# Patient Record
Sex: Female | Born: 1940
Health system: Southern US, Community
[De-identification: ages and names within clinical notes are randomized; demographics above are authoritative.]

## PROBLEM LIST (undated history)

## (undated) DIAGNOSIS — Z8639 Personal history of other endocrine, nutritional and metabolic disease: Secondary | ICD-10-CM

## (undated) DIAGNOSIS — H8102 Meniere's disease, left ear: Secondary | ICD-10-CM

## (undated) DIAGNOSIS — M797 Fibromyalgia: Secondary | ICD-10-CM

## (undated) DIAGNOSIS — R000 Tachycardia, unspecified: Secondary | ICD-10-CM

## (undated) DIAGNOSIS — G250 Essential tremor: Secondary | ICD-10-CM

## (undated) DIAGNOSIS — I7300 Raynaud's syndrome without gangrene: Secondary | ICD-10-CM

## (undated) DIAGNOSIS — K219 Gastro-esophageal reflux disease without esophagitis: Secondary | ICD-10-CM

## (undated) DIAGNOSIS — E118 Type 2 diabetes mellitus with unspecified complications: Secondary | ICD-10-CM

## (undated) DIAGNOSIS — I201 Angina pectoris with documented spasm: Secondary | ICD-10-CM

## (undated) DIAGNOSIS — R5383 Other fatigue: Secondary | ICD-10-CM

## (undated) DIAGNOSIS — E042 Nontoxic multinodular goiter: Secondary | ICD-10-CM

## (undated) DIAGNOSIS — K449 Diaphragmatic hernia without obstruction or gangrene: Secondary | ICD-10-CM

## (undated) DIAGNOSIS — E782 Mixed hyperlipidemia: Secondary | ICD-10-CM

## (undated) DIAGNOSIS — I10 Essential (primary) hypertension: Secondary | ICD-10-CM

## (undated) DIAGNOSIS — H353190 Nonexudative age-related macular degeneration, unspecified eye, stage unspecified: Secondary | ICD-10-CM

## (undated) DIAGNOSIS — E538 Deficiency of other specified B group vitamins: Secondary | ICD-10-CM

## (undated) DIAGNOSIS — J449 Chronic obstructive pulmonary disease, unspecified: Secondary | ICD-10-CM

## (undated) DIAGNOSIS — E1140 Type 2 diabetes mellitus with diabetic neuropathy, unspecified: Secondary | ICD-10-CM

## (undated) DIAGNOSIS — Z860100 Personal history of colon polyps, unspecified: Secondary | ICD-10-CM

## (undated) DIAGNOSIS — F419 Anxiety disorder, unspecified: Secondary | ICD-10-CM

## (undated) DIAGNOSIS — K589 Irritable bowel syndrome without diarrhea: Secondary | ICD-10-CM

## (undated) DIAGNOSIS — K5900 Constipation, unspecified: Secondary | ICD-10-CM

## (undated) DIAGNOSIS — R1013 Epigastric pain: Secondary | ICD-10-CM

## (undated) DIAGNOSIS — K5190 Ulcerative colitis, unspecified, without complications: Secondary | ICD-10-CM

## (undated) DIAGNOSIS — E063 Autoimmune thyroiditis: Secondary | ICD-10-CM

## (undated) DIAGNOSIS — E559 Vitamin D deficiency, unspecified: Secondary | ICD-10-CM

## (undated) DIAGNOSIS — H269 Unspecified cataract: Secondary | ICD-10-CM

## (undated) DIAGNOSIS — M1990 Unspecified osteoarthritis, unspecified site: Secondary | ICD-10-CM

## (undated) DIAGNOSIS — Z973 Presence of spectacles and contact lenses: Secondary | ICD-10-CM

## (undated) DIAGNOSIS — I2510 Atherosclerotic heart disease of native coronary artery without angina pectoris: Secondary | ICD-10-CM

## (undated) DIAGNOSIS — J45909 Unspecified asthma, uncomplicated: Secondary | ICD-10-CM

## (undated) DIAGNOSIS — E7800 Pure hypercholesterolemia, unspecified: Secondary | ICD-10-CM

## (undated) DIAGNOSIS — F439 Reaction to severe stress, unspecified: Secondary | ICD-10-CM

## (undated) DIAGNOSIS — M720 Palmar fascial fibromatosis [Dupuytren]: Secondary | ICD-10-CM

## (undated) DIAGNOSIS — G43009 Migraine without aura, not intractable, without status migrainosus: Secondary | ICD-10-CM

## (undated) HISTORY — DX: Raynaud's syndrome without gangrene: I73.00

## (undated) HISTORY — DX: Gastro-esophageal reflux disease without esophagitis: K21.9

## (undated) HISTORY — DX: Atherosclerotic heart disease of native coronary artery without angina pectoris: I25.10

## (undated) HISTORY — DX: Palmar fascial fibromatosis (dupuytren): M72.0

## (undated) HISTORY — DX: Essential tremor: G25.0

## (undated) HISTORY — DX: Nontoxic multinodular goiter: E04.2

## (undated) HISTORY — DX: Ulcerative colitis, unspecified, without complications: K51.90

## (undated) HISTORY — DX: Meniere's disease, left ear: H81.02

## (undated) HISTORY — DX: Unspecified cataract: H26.9

## (undated) HISTORY — DX: Mixed hyperlipidemia: E78.2

## (undated) HISTORY — DX: Deficiency of other specified B group vitamins: E53.8

## (undated) HISTORY — DX: Irritable bowel syndrome, unspecified: K58.9

## (undated) HISTORY — DX: Fibromyalgia: M79.7

## (undated) HISTORY — DX: Personal history of colon polyps, unspecified: Z86.0100

## (undated) HISTORY — DX: Autoimmune thyroiditis: E06.3

## (undated) HISTORY — DX: Personal history of colonic polyps: Z86.010

## (undated) HISTORY — DX: Chronic obstructive pulmonary disease, unspecified: J44.9

## (undated) HISTORY — DX: Type 2 diabetes mellitus with unspecified complications: E11.8

## (undated) HISTORY — DX: Constipation, unspecified: K59.00

## (undated) HISTORY — DX: Migraine without aura, not intractable, without status migrainosus: G43.009

## (undated) HISTORY — PX: TONSILLECTOMY: SUR1361

## (undated) HISTORY — DX: Epigastric pain: R10.13

## (undated) HISTORY — DX: Angina pectoris with documented spasm: I20.1

## (undated) HISTORY — DX: Personal history of other endocrine, nutritional and metabolic disease: Z86.39

## (undated) HISTORY — DX: Reaction to severe stress, unspecified: F43.9

## (undated) HISTORY — DX: Tachycardia, unspecified: R00.0

## (undated) HISTORY — DX: Vitamin D deficiency, unspecified: E55.9

## (undated) HISTORY — DX: Type 2 diabetes mellitus with diabetic neuropathy, unspecified: E11.40

## (undated) HISTORY — DX: Anxiety disorder, unspecified: F41.9

## (undated) HISTORY — DX: Nonexudative age-related macular degeneration, unspecified eye, stage unspecified: H35.3190

## (undated) HISTORY — DX: Pure hypercholesterolemia, unspecified: E78.00

## (undated) HISTORY — DX: Essential (primary) hypertension: I10

## (undated) HISTORY — DX: Unspecified asthma, uncomplicated: J45.909

## (undated) HISTORY — DX: Diaphragmatic hernia without obstruction or gangrene: K44.9

## (undated) HISTORY — PX: BREAST EXCISIONAL BIOPSY: SUR124

## (undated) HISTORY — PX: DILATION AND CURETTAGE OF UTERUS: SHX78

## (undated) HISTORY — DX: Other fatigue: R53.83

---

## 1969-02-24 HISTORY — PX: PARTIAL HYSTERECTOMY: SHX80

## 1988-02-25 HISTORY — PX: BREAST LUMPECTOMY: SHX2

## 1997-06-05 ENCOUNTER — Other Ambulatory Visit: Admission: RE | Admit: 1997-06-05 | Discharge: 1997-06-05 | Payer: Self-pay | Admitting: Obstetrics and Gynecology

## 1998-06-05 ENCOUNTER — Other Ambulatory Visit: Admission: RE | Admit: 1998-06-05 | Discharge: 1998-06-05 | Payer: Self-pay | Admitting: Obstetrics and Gynecology

## 1999-03-08 ENCOUNTER — Encounter (INDEPENDENT_AMBULATORY_CARE_PROVIDER_SITE_OTHER): Payer: Self-pay | Admitting: Specialist

## 1999-03-08 ENCOUNTER — Other Ambulatory Visit: Admission: RE | Admit: 1999-03-08 | Discharge: 1999-03-08 | Payer: Self-pay | Admitting: Gastroenterology

## 1999-04-19 ENCOUNTER — Encounter: Admission: RE | Admit: 1999-04-19 | Discharge: 1999-04-19 | Payer: Self-pay | Admitting: Obstetrics and Gynecology

## 1999-04-19 ENCOUNTER — Encounter: Payer: Self-pay | Admitting: Obstetrics and Gynecology

## 1999-04-29 ENCOUNTER — Ambulatory Visit (HOSPITAL_COMMUNITY): Admission: RE | Admit: 1999-04-29 | Discharge: 1999-04-29 | Payer: Self-pay | Admitting: Family Medicine

## 1999-04-29 ENCOUNTER — Encounter: Payer: Self-pay | Admitting: Family Medicine

## 1999-05-17 ENCOUNTER — Encounter: Payer: Self-pay | Admitting: *Deleted

## 1999-05-17 ENCOUNTER — Ambulatory Visit (HOSPITAL_COMMUNITY): Admission: RE | Admit: 1999-05-17 | Discharge: 1999-05-17 | Payer: Self-pay | Admitting: *Deleted

## 1999-05-24 ENCOUNTER — Other Ambulatory Visit: Admission: RE | Admit: 1999-05-24 | Discharge: 1999-05-24 | Payer: Self-pay | Admitting: Obstetrics and Gynecology

## 1999-08-14 ENCOUNTER — Ambulatory Visit (HOSPITAL_COMMUNITY): Admission: RE | Admit: 1999-08-14 | Discharge: 1999-08-14 | Payer: Self-pay | Admitting: Family Medicine

## 1999-08-14 ENCOUNTER — Encounter: Payer: Self-pay | Admitting: Family Medicine

## 1999-12-16 ENCOUNTER — Encounter: Payer: Self-pay | Admitting: Family Medicine

## 1999-12-16 ENCOUNTER — Ambulatory Visit (HOSPITAL_COMMUNITY): Admission: RE | Admit: 1999-12-16 | Discharge: 1999-12-16 | Payer: Self-pay | Admitting: Family Medicine

## 2000-01-02 ENCOUNTER — Encounter (INDEPENDENT_AMBULATORY_CARE_PROVIDER_SITE_OTHER): Payer: Self-pay | Admitting: Specialist

## 2000-01-02 ENCOUNTER — Other Ambulatory Visit: Admission: RE | Admit: 2000-01-02 | Discharge: 2000-01-02 | Payer: Self-pay | Admitting: Gastroenterology

## 2000-04-20 ENCOUNTER — Encounter: Payer: Self-pay | Admitting: Obstetrics and Gynecology

## 2000-04-20 ENCOUNTER — Encounter: Admission: RE | Admit: 2000-04-20 | Discharge: 2000-04-20 | Payer: Self-pay | Admitting: Obstetrics and Gynecology

## 2000-05-29 ENCOUNTER — Other Ambulatory Visit: Admission: RE | Admit: 2000-05-29 | Discharge: 2000-05-29 | Payer: Self-pay | Admitting: Obstetrics and Gynecology

## 2000-07-18 ENCOUNTER — Encounter: Payer: Self-pay | Admitting: Emergency Medicine

## 2000-07-18 ENCOUNTER — Emergency Department (HOSPITAL_COMMUNITY): Admission: EM | Admit: 2000-07-18 | Discharge: 2000-07-18 | Payer: Self-pay | Admitting: Emergency Medicine

## 2001-01-26 ENCOUNTER — Emergency Department (HOSPITAL_COMMUNITY): Admission: EM | Admit: 2001-01-26 | Discharge: 2001-01-27 | Payer: Self-pay | Admitting: Emergency Medicine

## 2001-01-26 ENCOUNTER — Encounter: Payer: Self-pay | Admitting: Emergency Medicine

## 2001-01-27 ENCOUNTER — Encounter: Payer: Self-pay | Admitting: Family Medicine

## 2001-01-27 ENCOUNTER — Ambulatory Visit (HOSPITAL_COMMUNITY): Admission: RE | Admit: 2001-01-27 | Discharge: 2001-01-27 | Payer: Self-pay | Admitting: Family Medicine

## 2001-01-28 ENCOUNTER — Encounter (INDEPENDENT_AMBULATORY_CARE_PROVIDER_SITE_OTHER): Payer: Self-pay | Admitting: Specialist

## 2001-01-28 ENCOUNTER — Encounter: Payer: Self-pay | Admitting: General Surgery

## 2001-01-29 ENCOUNTER — Inpatient Hospital Stay (HOSPITAL_COMMUNITY): Admission: EM | Admit: 2001-01-29 | Discharge: 2001-01-30 | Payer: Self-pay | Admitting: General Surgery

## 2001-04-22 ENCOUNTER — Encounter: Admission: RE | Admit: 2001-04-22 | Discharge: 2001-04-22 | Payer: Self-pay | Admitting: Obstetrics and Gynecology

## 2001-04-22 ENCOUNTER — Encounter: Payer: Self-pay | Admitting: Obstetrics and Gynecology

## 2001-05-06 ENCOUNTER — Ambulatory Visit (HOSPITAL_COMMUNITY): Admission: RE | Admit: 2001-05-06 | Discharge: 2001-05-06 | Payer: Self-pay | Admitting: General Surgery

## 2001-05-06 ENCOUNTER — Encounter: Payer: Self-pay | Admitting: General Surgery

## 2001-05-31 ENCOUNTER — Other Ambulatory Visit: Admission: RE | Admit: 2001-05-31 | Discharge: 2001-05-31 | Payer: Self-pay | Admitting: Obstetrics and Gynecology

## 2001-08-03 ENCOUNTER — Emergency Department (HOSPITAL_COMMUNITY): Admission: EM | Admit: 2001-08-03 | Discharge: 2001-08-03 | Payer: Self-pay | Admitting: Emergency Medicine

## 2001-08-03 ENCOUNTER — Encounter: Payer: Self-pay | Admitting: Emergency Medicine

## 2001-08-09 ENCOUNTER — Emergency Department (HOSPITAL_COMMUNITY): Admission: EM | Admit: 2001-08-09 | Discharge: 2001-08-09 | Payer: Self-pay | Admitting: Emergency Medicine

## 2002-02-22 ENCOUNTER — Encounter: Payer: Self-pay | Admitting: Family Medicine

## 2002-02-22 ENCOUNTER — Encounter: Admission: RE | Admit: 2002-02-22 | Discharge: 2002-02-22 | Payer: Self-pay | Admitting: Family Medicine

## 2002-06-02 ENCOUNTER — Encounter: Admission: RE | Admit: 2002-06-02 | Discharge: 2002-06-02 | Payer: Self-pay | Admitting: Obstetrics and Gynecology

## 2002-06-02 ENCOUNTER — Encounter: Payer: Self-pay | Admitting: Obstetrics and Gynecology

## 2002-06-06 ENCOUNTER — Other Ambulatory Visit: Admission: RE | Admit: 2002-06-06 | Discharge: 2002-06-06 | Payer: Self-pay | Admitting: Obstetrics and Gynecology

## 2002-07-11 ENCOUNTER — Encounter: Admission: RE | Admit: 2002-07-11 | Discharge: 2002-07-11 | Payer: Self-pay | Admitting: Rheumatology

## 2002-07-11 ENCOUNTER — Encounter: Payer: Self-pay | Admitting: Rheumatology

## 2003-03-28 ENCOUNTER — Ambulatory Visit (HOSPITAL_COMMUNITY): Admission: RE | Admit: 2003-03-28 | Discharge: 2003-03-28 | Payer: Self-pay | Admitting: Gastroenterology

## 2003-06-13 ENCOUNTER — Encounter: Admission: RE | Admit: 2003-06-13 | Discharge: 2003-06-13 | Payer: Self-pay | Admitting: Obstetrics and Gynecology

## 2003-06-19 ENCOUNTER — Other Ambulatory Visit: Admission: RE | Admit: 2003-06-19 | Discharge: 2003-06-19 | Payer: Self-pay | Admitting: Obstetrics and Gynecology

## 2003-12-26 ENCOUNTER — Ambulatory Visit (HOSPITAL_COMMUNITY): Admission: RE | Admit: 2003-12-26 | Discharge: 2003-12-26 | Payer: Self-pay | Admitting: Radiation Oncology

## 2004-03-04 ENCOUNTER — Ambulatory Visit (HOSPITAL_COMMUNITY): Admission: RE | Admit: 2004-03-04 | Discharge: 2004-03-04 | Payer: Self-pay | Admitting: Family Medicine

## 2004-05-28 ENCOUNTER — Encounter: Admission: RE | Admit: 2004-05-28 | Discharge: 2004-05-28 | Payer: Self-pay | Admitting: Gastroenterology

## 2004-06-13 ENCOUNTER — Encounter: Admission: RE | Admit: 2004-06-13 | Discharge: 2004-06-13 | Payer: Self-pay | Admitting: Obstetrics and Gynecology

## 2004-06-21 ENCOUNTER — Other Ambulatory Visit: Admission: RE | Admit: 2004-06-21 | Discharge: 2004-06-21 | Payer: Self-pay | Admitting: Obstetrics and Gynecology

## 2004-11-26 ENCOUNTER — Ambulatory Visit (HOSPITAL_COMMUNITY): Admission: RE | Admit: 2004-11-26 | Discharge: 2004-11-26 | Payer: Self-pay | Admitting: Gastroenterology

## 2004-11-26 ENCOUNTER — Encounter (INDEPENDENT_AMBULATORY_CARE_PROVIDER_SITE_OTHER): Payer: Self-pay | Admitting: *Deleted

## 2005-03-03 ENCOUNTER — Ambulatory Visit (HOSPITAL_COMMUNITY): Admission: RE | Admit: 2005-03-03 | Discharge: 2005-03-03 | Payer: Self-pay | Admitting: Oncology

## 2005-03-25 ENCOUNTER — Ambulatory Visit: Payer: Self-pay | Admitting: "Endocrinology

## 2005-03-31 ENCOUNTER — Encounter (INDEPENDENT_AMBULATORY_CARE_PROVIDER_SITE_OTHER): Payer: Self-pay | Admitting: *Deleted

## 2005-03-31 ENCOUNTER — Encounter: Admission: RE | Admit: 2005-03-31 | Discharge: 2005-03-31 | Payer: Self-pay | Admitting: "Endocrinology

## 2005-03-31 ENCOUNTER — Other Ambulatory Visit: Admission: RE | Admit: 2005-03-31 | Discharge: 2005-03-31 | Payer: Self-pay | Admitting: Interventional Radiology

## 2005-04-14 ENCOUNTER — Ambulatory Visit: Payer: Self-pay | Admitting: "Endocrinology

## 2005-06-17 ENCOUNTER — Encounter: Admission: RE | Admit: 2005-06-17 | Discharge: 2005-06-17 | Payer: Self-pay | Admitting: Obstetrics and Gynecology

## 2005-06-19 ENCOUNTER — Ambulatory Visit (HOSPITAL_COMMUNITY): Admission: RE | Admit: 2005-06-19 | Discharge: 2005-06-19 | Payer: Self-pay | Admitting: "Endocrinology

## 2005-06-23 ENCOUNTER — Other Ambulatory Visit: Admission: RE | Admit: 2005-06-23 | Discharge: 2005-06-23 | Payer: Self-pay | Admitting: Obstetrics and Gynecology

## 2005-06-24 ENCOUNTER — Ambulatory Visit: Payer: Self-pay | Admitting: "Endocrinology

## 2005-09-22 ENCOUNTER — Ambulatory Visit: Payer: Self-pay | Admitting: "Endocrinology

## 2005-12-19 ENCOUNTER — Ambulatory Visit (HOSPITAL_COMMUNITY): Admission: RE | Admit: 2005-12-19 | Discharge: 2005-12-19 | Payer: Self-pay | Admitting: "Endocrinology

## 2005-12-23 ENCOUNTER — Ambulatory Visit: Payer: Self-pay | Admitting: "Endocrinology

## 2005-12-30 ENCOUNTER — Encounter: Admission: RE | Admit: 2005-12-30 | Discharge: 2005-12-30 | Payer: Self-pay | Admitting: "Endocrinology

## 2005-12-30 ENCOUNTER — Encounter (INDEPENDENT_AMBULATORY_CARE_PROVIDER_SITE_OTHER): Payer: Self-pay | Admitting: Specialist

## 2005-12-30 ENCOUNTER — Other Ambulatory Visit: Admission: RE | Admit: 2005-12-30 | Discharge: 2005-12-30 | Payer: Self-pay | Admitting: Interventional Radiology

## 2006-01-07 ENCOUNTER — Observation Stay (HOSPITAL_COMMUNITY): Admission: EM | Admit: 2006-01-07 | Discharge: 2006-01-08 | Payer: Self-pay | Admitting: Emergency Medicine

## 2006-02-24 HISTORY — PX: CHOLECYSTECTOMY: SHX55

## 2006-04-13 ENCOUNTER — Ambulatory Visit: Payer: Self-pay | Admitting: "Endocrinology

## 2006-06-23 ENCOUNTER — Encounter: Admission: RE | Admit: 2006-06-23 | Discharge: 2006-06-23 | Payer: Self-pay | Admitting: Obstetrics and Gynecology

## 2006-06-29 ENCOUNTER — Encounter: Admission: RE | Admit: 2006-06-29 | Discharge: 2006-06-29 | Payer: Self-pay | Admitting: Obstetrics and Gynecology

## 2006-07-14 ENCOUNTER — Other Ambulatory Visit: Admission: RE | Admit: 2006-07-14 | Discharge: 2006-07-14 | Payer: Self-pay | Admitting: Obstetrics and Gynecology

## 2006-08-24 ENCOUNTER — Ambulatory Visit: Payer: Self-pay | Admitting: "Endocrinology

## 2006-11-25 ENCOUNTER — Ambulatory Visit (HOSPITAL_COMMUNITY): Admission: RE | Admit: 2006-11-25 | Discharge: 2006-11-25 | Payer: Self-pay | Admitting: "Endocrinology

## 2006-11-30 ENCOUNTER — Ambulatory Visit: Payer: Self-pay | Admitting: "Endocrinology

## 2006-12-30 ENCOUNTER — Encounter: Admission: RE | Admit: 2006-12-30 | Discharge: 2006-12-30 | Payer: Self-pay | Admitting: Obstetrics and Gynecology

## 2007-04-06 ENCOUNTER — Ambulatory Visit: Payer: Self-pay | Admitting: "Endocrinology

## 2007-06-24 ENCOUNTER — Encounter: Admission: RE | Admit: 2007-06-24 | Discharge: 2007-06-24 | Payer: Self-pay | Admitting: Family Medicine

## 2007-07-06 ENCOUNTER — Other Ambulatory Visit: Admission: RE | Admit: 2007-07-06 | Discharge: 2007-07-06 | Payer: Self-pay | Admitting: Family Medicine

## 2008-01-05 ENCOUNTER — Encounter: Admission: RE | Admit: 2008-01-05 | Discharge: 2008-01-05 | Payer: Self-pay | Admitting: Family Medicine

## 2008-01-21 ENCOUNTER — Encounter: Admission: RE | Admit: 2008-01-21 | Discharge: 2008-01-21 | Payer: Self-pay | Admitting: Family Medicine

## 2008-02-09 ENCOUNTER — Ambulatory Visit: Payer: Self-pay | Admitting: "Endocrinology

## 2008-03-23 ENCOUNTER — Encounter: Admission: RE | Admit: 2008-03-23 | Discharge: 2008-03-23 | Payer: Self-pay | Admitting: General Surgery

## 2008-08-22 ENCOUNTER — Encounter: Admission: RE | Admit: 2008-08-22 | Discharge: 2008-08-22 | Payer: Self-pay | Admitting: Family Medicine

## 2008-08-23 ENCOUNTER — Ambulatory Visit: Payer: Self-pay | Admitting: "Endocrinology

## 2009-02-19 ENCOUNTER — Encounter: Admission: RE | Admit: 2009-02-19 | Discharge: 2009-02-19 | Payer: Self-pay | Admitting: Family Medicine

## 2009-08-23 ENCOUNTER — Ambulatory Visit: Payer: Self-pay | Admitting: "Endocrinology

## 2009-08-23 ENCOUNTER — Encounter: Admission: RE | Admit: 2009-08-23 | Discharge: 2009-08-23 | Payer: Self-pay | Admitting: "Endocrinology

## 2009-09-17 ENCOUNTER — Encounter: Admission: RE | Admit: 2009-09-17 | Discharge: 2009-09-17 | Payer: Self-pay | Admitting: Family Medicine

## 2010-02-14 ENCOUNTER — Ambulatory Visit: Payer: Self-pay | Admitting: "Endocrinology

## 2010-03-17 ENCOUNTER — Encounter: Payer: Self-pay | Admitting: Family Medicine

## 2010-04-03 ENCOUNTER — Other Ambulatory Visit: Payer: Self-pay | Admitting: Gastroenterology

## 2010-06-18 ENCOUNTER — Encounter: Payer: Self-pay | Admitting: *Deleted

## 2010-06-18 ENCOUNTER — Other Ambulatory Visit: Payer: Self-pay | Admitting: *Deleted

## 2010-06-18 DIAGNOSIS — E7089 Other disorders of aromatic amino-acid metabolism: Secondary | ICD-10-CM

## 2010-06-18 DIAGNOSIS — E038 Other specified hypothyroidism: Secondary | ICD-10-CM

## 2010-07-11 ENCOUNTER — Other Ambulatory Visit: Payer: Self-pay | Admitting: "Endocrinology

## 2010-07-12 NOTE — Op Note (Signed)
NAMEDENYCE, Shelby Lin                ACCOUNT NO.:  [id number]   MEDICAL RECORD NO.:  [id number]          PATIENT TYPE:  AMB   LOCATION:  ENDO                         FACILITY:  [facility]   PHYSICIAN:  James L. Malon Kindle., M.D.DATE OF BIRTH:  1940-04-23   DATE OF PROCEDURE:  11/26/2004  DATE OF DISCHARGE:                                 OPERATIVE REPORT   PROCEDURE PERFORMED:  Colonoscopy and biopsy.   ENDOSCOPIST:  Llana Aliment. Randa Evens, M.D.   MEDICATIONS:  Benadryl 50 mg IV, fentanyl 70 mcg, Versed 7.5 mg IV.   INDICATIONS FOR PROCEDURE:  Patient with a previous of adenomatous colon  polyps as well as history of colitis with last colonoscopy back in 2003  showing minimal active colitis.  This is done in follow-up for colitis and  polyps.   DESCRIPTION OF PROCEDURE:  The procedure had been explained to the patient  and consent obtained.  With the patient in the left lateral decubitus  position, digital rectal exam was performed.  The scope was inserted and  advanced.  The prep was excellent.  The ileocecal valve and appendiceal  orifice were identified.  The scope was withdrawn and no polyps were seen  throughout the entire colon, no diverticular disease.  The mucosa was  completely endoscopically normal throughout the entire colon.  Multiple  biopsies were obtained and labeled as random colon biopsies.  The ascending  and transverse colon were biopsied and placed in jar #1.  The descending  colon was biopsied and placed in jar #2.  The sigmoid colon and rectum were  biopsied and placed in jar #3.  No polyps were seen throughout the entire  colon.  The scope was withdrawn.  The patient tolerated the procedure well.   ASSESSMENT:  1.  History of colon polyps with negative colonoscopy at this time.  2.  Ulcerative colitis by history with no active colitis seen on endoscopy.   PLAN:  1.  Will go ahead and check the path results for signs of dysplasia.  2.  Will keep on all the same  medications.  3.  Will see back in the office in six to eight months.  4.  Will repeat colonoscopy in five years           ______________________________  Llana Aliment. Malon Kindle., M.D.     Waldron Session  D:  11/26/2004  T:  11/26/2004  Job:  [id number]   cc:   Dario Guardian, M.D.  Fax: [phone]

## 2010-07-12 NOTE — H&P (Signed)
Shelby Lin, Shelby Lin                ACCOUNT NO.:  [id number]   MEDICAL RECORD NO.:  [id number]          PATIENT TYPE:  INP   LOCATION:  1824                         FACILITY:  [facility]   PHYSICIAN:  Kela Millin, M.D.DATE OF BIRTH:  Dec 04, 1940   DATE OF ADMISSION:  01/07/2006  DATE OF DISCHARGE:                              HISTORY & PHYSICAL   CHIEF COMPLAINT:  Chest pain.   HISTORY OF PRESENT ILLNESS:  The patient is a pleasant 70 year old  female with a past medical history significant for hypertension, she  smoked in the past, hiatal hernia/GERD, who presents with complaints of  chest pain x 20 minutes.  She describes the pain as grabbing,  midsternal in location, and radiating to her neck and jaw, and  associated with shortness of breath.  She denies nausea or vomiting, and  no diaphoresis.  She denies PND, orthopnea, cough, fevers, hematemesis,  dysuria, diarrhea, and no hematochezia.  The patient was seen in the ER,  and an EKG revealed a normal sinus rhythm with nonspecific ST and T  changes.  Her point-of-care markers were negative.  The patient states  that some months ago she saw Dr. Patty Sermons, and had a stress test done,  and it was reported to be within normal limits.  She is admitted to the  [facility] hospitalist service for further evaluation and management.   PAST MEDICAL HISTORY:  1. As stated above.  2. Status post cholecystectomy.  3. Hypothyroidism.  4. History of ulcerative colitis - last colonoscopy in 2006 negative.  5. History of adenomatous polyp - followup colonoscopy in 2006      negative.  6. History of depression.   MEDICATIONS:  1. Premarin 0.625 mg daily.  2. Synthroid alternating dosages of 88 and 100 mcg every other day.  3. Enalapril 10/25 mg daily.  4. Cymbalta 90 mg daily.  5. Vitamin D daily.  6. Multivitamins daily.   ALLERGIES:  1. CODEINE.  2. LORABID.  3. SULFA.   SOCIAL HISTORY:  She states that she quit tobacco 6 years ago.   She  denies alcohol.   FAMILY HISTORY:  Her dad is deceased following a massive heart attack at  age 60.  Her sister has coronary artery disease first diagnosed at age  66.   REVIEW OF SYSTEMS:  As per HPI.  Other review of systems negative.   PHYSICAL EXAMINATION:  GENERAL:  The patient is an elderly female, well  developed, well nourished in no apparent distress.  VITAL SIGNS:  Her blood pressure is 99/58.  Temperature is 96.9 with a  pulse of 87.  Respiratory rate is 16.  O2 sat is 99%.  HEENT:  PERRL; EOMI.  Sclerae anicteric.  Moist mucous membranes.  No  oral exudates.  NECK:  Supple.  No adenopathy.  No thyromegaly.  No JVD.  LUNGS:  Clear to auscultation bilaterally.  No crackles or wheezes.  CARDIOVASCULAR:  Regular rate and rhythm.  Normal S1 and S2.  No S3  appreciated.  ABDOMEN:  Soft.  Bowel sounds present.  Nontender, nondistended.  No  organomegaly,  and no masses palpable.  EXTREMITIES:  No cyanosis, and no edema.  NEURO:  Alert, and oriented x 3.  Cranial nerves 2-12 grossly intact.  Nonfocal exam.   LABORATORY DATA:  Point-of-care markers negative x 1, and her sodium is  134 with a potassium of 3.4.  Chloride is 99, BUN 11, glucose of 92.  The pH is 7.41, PCO2 of 46.5, and the INR is 1.0.  PT is 13.4.  The  hematocrit is 38%.  Hemoglobin is 12.9.   ASSESSMENT AND PLAN:  1. Chest pain - we will obtain serial cardiac enzymes, and place      patient on aspirin and nitrates.  We will obtain a fasting lipid      profile and also a  D-dimer, and follow.  The patient had the stress test by Dr. Patty Sermons  some months ago.  We will consult him for further recommendations.  1. Gastroesophageal reflux disease/hiatal hernia - placed on PPI (it      is noted that the patient does not report any PPI as current      medications, although in the office note a few months ago Aciphex      was listed as one of her medications at that time).  2. Hypertension - monitor blood  pressures, borderline BP at this time      so we will hold HCTZ, and place hold parameters on her blood      pressure medications.  3. Hypokalemia, mild - replace potassium.  4. Hypothyroidism - continue Synthroid.  5. History of depression - continue Cymbalta.      Kela Millin, M.D.  Electronically Signed     ACV/MEDQ  D:  01/07/2006  T:  01/07/2006  Job:  [id number]   cc:   Dario Guardian, M.D.  Cassell Clement, M.D.

## 2010-07-12 NOTE — Cardiovascular Report (Signed)
NAMEKITARA, HEBB NO.:  [id number]   MEDICAL RECORD NO.:  [id number]          PATIENT TYPE:  INP   LOCATION:  3739                         FACILITY:  [facility]   PHYSICIAN:  Elmore Guise., M.D.DATE OF BIRTH:  06-Nov-1940   DATE OF PROCEDURE:  01/08/2006  DATE OF DISCHARGE:                            CARDIAC CATHETERIZATION   INDICATIONS FOR PROCEDURE:  Increasing exertional chest pain, strong  family history of heart disease with multiple cardiac risk factors.   DESCRIPTION OF PROCEDURE:  The patient was brought to the cardiac cath  lab after appropriate informed consent.  She was prepped and draped in  sterile fashion.  Approximately 10 mL of 1% lidocaine were used for  local anesthesia.  A 6-French sheath was placed in the right femoral  artery without difficulty.  Coronary angiography, LV angiography,  limited right femoral and distal aorta angiography were then performed.  The patient tolerated procedure well with no apparent complications.   FINDINGS:  1. Left Main:  Short and normal.  2. LAD:  Mild luminal irregularities with no significant obstructive      disease.  3. D-1:  Small to moderate-sized vessel, high branching with mild      luminal irregularities.  4. D-2:  Small to moderate-sized vessel with distal branching and mild      luminal irregularities.  5. LCX:  Dominant large vessel with mild luminal irregularities.  6. OM-1/OM-2/OM-3:  Large vessels, normal-appearing, with no      significant obstructive disease.  7. PDA/PLV:  No significant disease.  8. RCA:  Nondominant small vessel, mild proximal spasm noted.  No      significant obstructive disease.  9. LV:  EF is 55%.  No wall motion abnormalities.  LVEDP was 9 mmHg.  10.Limited right femoral angiogram was performed to see if arteriotomy      site was adequate for Angio-Seal closure device placement.  She had      normal-appearing common femoral with spasm noted in her proximal  portion of her right common femoral artery.  A distal aortogram was      then performed showing normal distal aorta and normal right and      left iliacs with mild vessel spasm in the right common femoral      artery.  No dissection was noted.   IMPRESSION:  1. Nonobstructive coronary artery disease.  2. Normal left ventricular systolic function with an ejection fraction      of 55%.  3. Mild vasospastic disease in her peripheral vessels with spasm noted      in her right common femoral artery.   PLAN:  1. At this time, I would recommend aggressive risk factor modification      as indicated.      Elmore Guise., M.D.  Electronically Signed     TWK/MEDQ  D:  01/08/2006  T:  01/08/2006  Job:  [id number]   cc:   Cassell Clement, M.D.

## 2010-07-12 NOTE — Op Note (Signed)
NAME:  Shelby Lin, Shelby Lin                          ACCOUNT NO.:  [id number]   MEDICAL RECORD NO.:  [id number]                   PATIENT TYPE:  AMB   LOCATION:  ENDO                                 FACILITY:  [facility]   PHYSICIAN:  James L. Malon Kindle., M.D.          DATE OF BIRTH:  [birth year]   DATE OF PROCEDURE:  03/28/2003  DATE OF DISCHARGE:                                 OPERATIVE REPORT   PROCEDURE PERFORMED:  Esophagogastroduodenoscopy and biopsy.   MEDICATIONS:  Cetacaine spray.  Fentanyl 70 mcg, Versed 7 mg IV.   ENDOSCOPIST:  Llana Aliment. Randa Evens, M.D.   INDICATIONS FOR PROCEDURE:  Patient with a history of ulcerative colitis,  had epigastric pain, is post cholecystectomy.  Started on Aciphex.   DESCRIPTION OF PROCEDURE:  The procedure had been explained to the patient  and consent obtained.  With the patient in left lateral decubitus position,  the Olympus scope was inserted and advanced.  The stomach was entered.  The  pylorus identified and passed.  The duodenum including the bulb and second  portion were seen well.  They were normal.  No ulceration or inflammation of  the duodenum and the second portion or in the bulb.  The scope was withdrawn  back into the stomach.  Several gastric erosions and marked gastric  erythema.  Biopsy taken for rapid urease test for Helicobacter pylori.  No  ulcerations.  The body of the stomach was free of ulcerations as was the  fundus and cardia.  There was a hiatal hernia approximately 4 cm extending  from 40 to 36 cm.  Distal esophagus slightly reddened consistent with  reflux.  The scope was withdrawn.  The patient tolerated the procedure well.   ASSESSMENT:  1. Hiatal hernia with probable gastroesophageal reflux disease, 530.81.  2. Gastritis, 535.00.   PLAN:  Will check the results of the test for Helicobacter pylori.  Will  give a reflux sheet.  See back in the office in three to four weeks.  Have  her call or report in one week.  If she  is still symptomatic, may well need  MRCP of her bowel done.                                               James L. Malon Kindle., M.D.    Waldron Session  D:  03/28/2003  T:  03/28/2003  Job:  [id number]   cc:   Meredith Staggers, M.D.  [address], [address]  [place]  [place] [address]  Fax: [phone]

## 2010-07-12 NOTE — Consult Note (Signed)
Shelby Lin, Shelby Lin                ACCOUNT NO.:  [id number]   MEDICAL RECORD NO.:  [id number]          PATIENT TYPE:  INP   LOCATION:  3739                         FACILITY:  [facility]   PHYSICIAN:  Cassell Clement, M.D. DATE OF BIRTH:  May 07, 1940   DATE OF CONSULTATION:  01/07/2006  DATE OF DISCHARGE:                                   CONSULTATION   CHIEF COMPLAINT:  Chest pain.   HISTORY:  This is a 70 year old married Caucasian female admitted by  ambulance with substernal chest pain.  The discomfort began at approximately  11:00 a.m. today while she was at work. She took two baby aspirin given to  her by a coworker, and EMS was called. When they arrived, they gave her to  additional aspirin and gave her nitroglycerin twice in the ambulance. The  pain gradually subsided. The pain felt like a tight indigestion in the  substernal area and radiated to both sides of her jaw but not to her arms or  back.  There was no diaphoresis or nausea, and she was slightly dyspneic.  Of note is the fact that we had seen Shelby Lin in our office on June 20, 2005, at which time she had a normal Cardiolite stress test.  She, at that  time, showed good exercise tolerance, walking 4 minutes, achieved 86% of her  predicted maximal heart rate and was moderately dyspneic but had no chest  pain and had nonspecific ST-T wave abnormalities but no perfusion evidence  of ischemia, and her LV function showed an ejection fraction of 73%, and it  was felt to be a normal tests.   FAMILY HISTORY:  Reveals that the patient's father died of sudden cardiac  death but no heart attack at age 37.  The patient's mother died at age 51  after falling off a porch. The the patient has a sister who had coronary  bypass at 51.   SOCIAL HISTORY:  Reveals that Shelby Lin smoked until 6 years ago.  She does  not drink alcohol.  She is married.  She has two grown children, ages 67 and  33. The patient works at the [employer].   PRESENT MEDICATIONS:  1. Premarin 0.65 mg daily.  2. Synthroid 0.1 alternating with 0.088 on alternate days.  3. Enalapril/hydrocortisone 10/25 one daily.  4. Cymbalta 90 mg daily for fibromyalgia.  5. Multivitamins.   PAST SURGICAL HISTORY:  Includes hysterectomy and cholecystectomy, and she  has had some previous fine needle aspirations of her thyroid by Dr. Fransico Michael,  her endocrinologist.   REVIEW OF SYSTEMS:  Is positive for reflux, and she takes occasional  antacids.  She denies any genitourinary symptoms.  Denies any cough or  sputum production.  The remainder of Review of Systems is negative in  detail. Of note is the fact that she reports that her cholesterols have been  borderline high in the past, and she is not presently on statin but has  been advised to be more careful with her diet.  She recalls that a recent  cholesterol was 238 total.  PHYSICAL EXAMINATION:  VITAL SIGNS: Blood pressure is 126/70, pulse 88 and  regular, respirations normal.  HEENT:  Negative.  CHEST:  Clear.  HEART:  No murmur, gallop, rub or click.  ABDOMEN:  Soft, nontender, no mass.  EXTREMITIES:  No phlebitis or edema.   Chest x-ray shows COPD.   EKG shows nonspecific T changes.  No acute ischemic changes.   Labs include sodium 134, potassium 3.4, BUN 11, creatinine 0.7, CK-MB 1.3,  troponin 0.05. Fibrin split products showed normal.   IMPRESSION:  1. Chest pain, rule out myocardial infarction.  2. Multiple risk factors for coronary disease including prior cigarette      smoking, positive family history, history of hypercholesterolemia, and      history of hypertension.  3. Fibromyalgia.  4. Hypokalemia.  5. Essential hypertension.   DISPOSITION:  1. We are going to admit to telemetry as per hospitalists' orders.  2. She will be on Lovenox and aspirin.  3. She will receive serial enzymes.  4. We will add low-dose beta blocker to her regimen.  5. We will anticipate cardiac  catheterization on January 08, 2006, by Dr.      Reyes Ivan or associates at [facility].   Signed considerable and left ear           ______________________________  Cassell Clement, M.D.     TB/MEDQ  D:  01/07/2006  T:  01/07/2006  Job:  [id number]   cc:   Dario Guardian, M.D.  Kela Millin, M.D.  Elmore Guise., M.D.

## 2010-07-12 NOTE — Op Note (Signed)
[id number] [facility]  Patient:    Shelby Lin, Shelby Lin Visit Number: [id number] MRN: [id number]          Service Type: SUR Location: 3W [id number] [id number] Attending Physician:  Delsa Bern Dictated by:   Lorne Skeens. Hoxworth, M.D. Proc. Date: 01/28/01 Admit Date:  01/28/2001                             Operative Report  PREOPERATIVE DIAGNOSIS:  Cholelithiasis and acute cholecystitis.  POSTOPERATIVE DIAGNOSIS:  Cholelithiasis and acute cholecystitis.  SURGICAL PROCEDURE:  Laparoscopic cholecystectomy with intraoperative cholangiogram.  SURGEON:  Sharlet Salina T. Hoxworth, M.D.  ANESTHESIA:  General.  BRIEF HISTORY:  The patient is a 70 year old white female who presents with approximately two-day history of fairly severe upper gastric and right upper quadrant abdominal pain. She has had a gallbladder ultrasound showing stones and a thickened gallbladder wall with normal common bile duct. LFTs are normal except for a mild elevated alkaline phosphatase. She has localized tenderness in the right upper quadrant. Laparoscopic cholecystectomy with cholangiogram has been recommended for apparent acute cholecystitis. The nature of the procedure, its indications, risks of bleeding, infection, bowel, and _______ injury are discussed and understood preoperatively. She is now brought to the operating room for this procedure.  DESCRIPTION OF OPERATION:  The patient was brought to the operating room, placed in the supine position on the operative table and general endotracheal anesthesia was induced. She was given broad spectrum antibiotics preoperatively. The abdomen was sterilely prepped and draped. Local anesthesia was used to infiltrate the trocar sites prior to the incisions. A 1-cm incision was made at the umbilicus and dissection was carried down to the midline fascia. This was sharply incised 1 cm and peritoneum entered under direct vision. Through a mattress  suture of 0 Vicryl, the Hasson trocar was placed, and a pneumoperitoneum established. Under direct vision, a 10 mm trocar was placed in the subxiphoid area and two 5 mm trocars along the right subcostal margin. The gallbladder was exposed by bluntly stripping some omentum off the dome of the gallbladder. It was markedly acutely inflamed and thickened. The gallbladder was decompressed with a needle aspirator obtaining clear bile. The fundus was then able to grasped and elevated up over the liver. Omental adhesions were taken down mostly with careful blunt dissection and the infundibulopelvic ligament was exposed and retracted inferolaterally. Further careful blunt dissection exposed the distal gallbladder and porta hepatis. Fibrofatty tissue was stripped off the neck of the gallbladder toward the porta hepatis. With careful dissection, the distal gallbladder was thoroughly dissected and the cystic gallbladder junction identified. This was dissected 360 degrees and the cystic duct was cleared completely up to its junction with the gallbladder. At this point, it was clipped and operative cholangiogram was obtained through the cystic duct. This showed good filling of the common bile duct, intrahepatic ducts, and free flow into the duodenum with no filling defects. Following this, the cholangiocath was removed and the cystic duct was doubly clipped proximally and divided. The cystic artery clearly was seen coursing up to the gallbladder wall. It was doubly clipped proximally, clipped distally, and divided. The gallbladder was the dissected free from its bed using hook cautery. It was placed in an Endocatch bag and brought out through the umbilicus. Right upper quadrant was thoroughly irrigated with complete hemostasis assured. Closed suction drain was left in the subhepatic space and brought out through one of the lateral trocar  sites. Trocars were removed under direct vision. All CO2 was  evacuated from the peritoneal cavity. The pursestring suture was good at the umbilicus. Skin incisions were closed with interrupted subcuticular 4-0 Monocryl and Steri-Strips. Sponge, needle, and instrument counts were correct. _______ dressing were applied and the patient taken to recovery in satisfactory condition. Dictated by:   Lorne Skeens. Hoxworth, M.D. Attending Physician:  Delsa Bern DD:  01/28/01 TD:  01/28/01 Job: [id number]

## 2010-07-24 ENCOUNTER — Ambulatory Visit (INDEPENDENT_AMBULATORY_CARE_PROVIDER_SITE_OTHER): Payer: Medicare Other | Admitting: "Endocrinology

## 2010-07-24 VITALS — BP 130/74 | HR 91 | Wt 156.5 lb

## 2010-07-24 DIAGNOSIS — E049 Nontoxic goiter, unspecified: Secondary | ICD-10-CM

## 2010-07-24 DIAGNOSIS — R61 Generalized hyperhidrosis: Secondary | ICD-10-CM

## 2010-07-24 DIAGNOSIS — E063 Autoimmune thyroiditis: Secondary | ICD-10-CM

## 2010-07-24 DIAGNOSIS — E038 Other specified hypothyroidism: Secondary | ICD-10-CM

## 2010-07-24 DIAGNOSIS — G44229 Chronic tension-type headache, not intractable: Secondary | ICD-10-CM

## 2010-07-25 LAB — LUTEINIZING HORMONE: LH: 24.4 m[IU]/mL

## 2010-07-25 LAB — FOLLICLE STIMULATING HORMONE: FSH: 24.8 m[IU]/mL

## 2010-07-25 LAB — ESTRADIOL: Estradiol: 64.8 pg/mL

## 2010-07-29 ENCOUNTER — Encounter: Payer: Self-pay | Admitting: *Deleted

## 2010-07-29 ENCOUNTER — Other Ambulatory Visit: Payer: Self-pay | Admitting: "Endocrinology

## 2010-08-01 LAB — CATECHOLAMINES, FRACTIONATED, URINE, 24 HOUR
Creatinine, Urine mg/day-CATEUR: 1.21 g/(24.h) (ref 0.63–2.50)
Dopamine, 24 hr Urine: 274 mcg/24 h (ref 52–480)

## 2010-08-02 LAB — METANEPHRINES, URINE, 24 HOUR
Metanephrines, Ur: 58 mcg/24 h — ABNORMAL LOW (ref 90–315)
Normetanephrine, 24H Ur: 678 mcg/24 h — ABNORMAL HIGH (ref 122–676)

## 2010-08-02 LAB — VMA, URINE, 24 HOUR: Vanillylmandelic Acid, (VMA): 5.2 mg/24 h (ref <=6.0)

## 2010-08-13 ENCOUNTER — Encounter: Payer: Self-pay | Admitting: *Deleted

## 2010-09-19 ENCOUNTER — Other Ambulatory Visit: Payer: Self-pay | Admitting: Family Medicine

## 2010-09-19 DIAGNOSIS — Z1231 Encounter for screening mammogram for malignant neoplasm of breast: Secondary | ICD-10-CM

## 2010-09-25 ENCOUNTER — Ambulatory Visit: Payer: Medicare Other

## 2010-09-26 ENCOUNTER — Encounter: Payer: Self-pay | Admitting: *Deleted

## 2010-10-01 ENCOUNTER — Ambulatory Visit
Admission: RE | Admit: 2010-10-01 | Discharge: 2010-10-01 | Disposition: A | Discharge status: Home / Self Care | Payer: Medicare Other | Source: Ambulatory Visit | Attending: Family Medicine | Admitting: Family Medicine

## 2010-10-01 DIAGNOSIS — Z1231 Encounter for screening mammogram for malignant neoplasm of breast: Secondary | ICD-10-CM

## 2010-10-04 ENCOUNTER — Other Ambulatory Visit: Payer: Self-pay | Admitting: Family Medicine

## 2010-10-04 DIAGNOSIS — R928 Other abnormal and inconclusive findings on diagnostic imaging of breast: Secondary | ICD-10-CM

## 2010-10-11 ENCOUNTER — Ambulatory Visit
Admission: RE | Admit: 2010-10-11 | Discharge: 2010-10-11 | Disposition: A | Discharge status: Home / Self Care | Payer: Medicare Other | Source: Ambulatory Visit | Attending: Family Medicine | Admitting: Family Medicine

## 2010-10-11 ENCOUNTER — Other Ambulatory Visit: Payer: Self-pay | Admitting: Family Medicine

## 2010-10-11 DIAGNOSIS — R928 Other abnormal and inconclusive findings on diagnostic imaging of breast: Secondary | ICD-10-CM

## 2010-10-14 ENCOUNTER — Ambulatory Visit
Admission: RE | Admit: 2010-10-14 | Discharge: 2010-10-14 | Disposition: A | Discharge status: Home / Self Care | Payer: Medicare Other | Source: Ambulatory Visit | Attending: Family Medicine | Admitting: Family Medicine

## 2010-10-14 ENCOUNTER — Other Ambulatory Visit: Payer: Self-pay | Admitting: Diagnostic Radiology

## 2010-10-14 DIAGNOSIS — R928 Other abnormal and inconclusive findings on diagnostic imaging of breast: Secondary | ICD-10-CM

## 2010-11-20 ENCOUNTER — Ambulatory Visit: Payer: Medicare Other | Admitting: "Endocrinology

## 2010-11-21 ENCOUNTER — Ambulatory Visit (INDEPENDENT_AMBULATORY_CARE_PROVIDER_SITE_OTHER): Payer: Medicare Other | Admitting: "Endocrinology

## 2010-11-21 VITALS — BP 111/72 | HR 99 | Wt 155.0 lb

## 2010-11-21 DIAGNOSIS — M79606 Pain in leg, unspecified: Secondary | ICD-10-CM

## 2010-11-21 DIAGNOSIS — E038 Other specified hypothyroidism: Secondary | ICD-10-CM

## 2010-11-21 DIAGNOSIS — R61 Generalized hyperhidrosis: Secondary | ICD-10-CM

## 2010-11-21 DIAGNOSIS — R200 Anesthesia of skin: Secondary | ICD-10-CM

## 2010-11-21 DIAGNOSIS — E063 Autoimmune thyroiditis: Secondary | ICD-10-CM

## 2010-11-21 DIAGNOSIS — M79609 Pain in unspecified limb: Secondary | ICD-10-CM

## 2010-11-21 DIAGNOSIS — R5383 Other fatigue: Secondary | ICD-10-CM

## 2010-11-21 DIAGNOSIS — R5381 Other malaise: Secondary | ICD-10-CM

## 2010-11-21 DIAGNOSIS — R209 Unspecified disturbances of skin sensation: Secondary | ICD-10-CM

## 2010-11-21 NOTE — Patient Instructions (Signed)
Follow up visit in 3 months. 

## 2010-11-22 LAB — PTH, INTACT AND CALCIUM
Calcium, Total (PTH): 9.5 mg/dL (ref 8.4–10.5)
PTH: 37.6 pg/mL (ref 14.0–72.0)

## 2010-11-25 LAB — VITAMIN D 1,25 DIHYDROXY
Vitamin D 1, 25 (OH)2 Total: 85 pg/mL — ABNORMAL HIGH (ref 18–72)
Vitamin D3 1, 25 (OH)2: 33 pg/mL

## 2010-12-21 ENCOUNTER — Encounter: Payer: Self-pay | Admitting: "Endocrinology

## 2010-12-21 DIAGNOSIS — E063 Autoimmune thyroiditis: Secondary | ICD-10-CM | POA: Insufficient documentation

## 2010-12-21 DIAGNOSIS — R1013 Epigastric pain: Secondary | ICD-10-CM | POA: Insufficient documentation

## 2010-12-21 DIAGNOSIS — E042 Nontoxic multinodular goiter: Secondary | ICD-10-CM | POA: Insufficient documentation

## 2010-12-21 DIAGNOSIS — R000 Tachycardia, unspecified: Secondary | ICD-10-CM | POA: Insufficient documentation

## 2010-12-21 DIAGNOSIS — E782 Mixed hyperlipidemia: Secondary | ICD-10-CM | POA: Insufficient documentation

## 2010-12-21 DIAGNOSIS — G250 Essential tremor: Secondary | ICD-10-CM | POA: Insufficient documentation

## 2010-12-21 DIAGNOSIS — M797 Fibromyalgia: Secondary | ICD-10-CM | POA: Insufficient documentation

## 2010-12-21 DIAGNOSIS — R5383 Other fatigue: Secondary | ICD-10-CM | POA: Insufficient documentation

## 2010-12-21 DIAGNOSIS — M720 Palmar fascial fibromatosis [Dupuytren]: Secondary | ICD-10-CM | POA: Insufficient documentation

## 2010-12-21 DIAGNOSIS — I201 Angina pectoris with documented spasm: Secondary | ICD-10-CM | POA: Insufficient documentation

## 2010-12-21 NOTE — Progress Notes (Signed)
Subjective:  Patient Name: Shelby Lin Date of Birth: 07-10-1940  MRN: [id number]  Shelby Lin  presents to the office today for follow-up of her hypothyroidism secondary to Hashimoto's disease, multinodular goiter, tachycardia, tremor, dyspepsia, fatigue, hyperlipidemia, night sweats, and tremor.  HISTORY OF PRESENT ILLNESS:   Shelby Lin is a 70 y.o. Caucasian woman.  Ronald was unaccompanied.  1. Patient was referred to me on 03/25/2005 by her primary care provider, Dr. Merri Brunette, for evaluation of her thyroid problems. Patient was 64 at that time. At about age 19 the patient developed thyroid problems and was put on a thyroid medicine. Later when she became pregnant her obstetrician took her off that medicine. At about age 27, she developed severe fatigue and weight gain. She was put back on Synthroid. Her dose of Synthroid was 88 mcg per day for many years. In January 2006, she had fluctuations in her thyroid function tests. Synthroid was increased to 100 mcg and later to 125 mcg per day. The Synthroid dose was then reduced in July 2000 to 75 mcg. After her most recent blood test about 4 months ago, the patient was put back on a dose of 88 mcg per day. When the patient was having fluctuations in her thyroid tests, her thyroid gland became visibly enlarged and felt full and different to palpation. She was also more hoarse during that period. Hoarseness comes and goes in parallel with the thyroid gland swelling. Ultrasound studies of the thyroid in January 2006 and January 2007 showed multinodular goiter.  Thyroid function tests performed at that first visit showed a TSH of 4.139, free T4-1 0.22, and free T3 of 2.7. Because any TSH at that time greater than 3 was really high, I increased her Synthroid from 88 to 100 mcg per day. TPO antibody was markedly positive at 954.0. TSI was quite normal at 0.9.  2. During the last 5 years the patient has had several flareups of Hashimoto's disease. In February  2011 we had to increase her Synthroid dose to 125 mcg per day. Later that year we reduced the dose to 125 mcg 5 days per week. As of her last [facility] visit on 07/24/10, she was euthyroid with a Synthroid dose of 88 mcg 6 days per week. In the interim, she had a needle biopsy of her breasts. Pathology was benign. She has headaches almost every day. She takes tramadol, but that medication does not make her feel good. She does not take in much caffeine. She continues to have night sweats 3-4 nights per week. She also has sweats occasionally during the day. A new issue for her is numbness involving the right side of her face, right upper chest, and right leg and foot. The numbness occurred approximately 5 times in the past week. The numbness may last anywhere from an hour to 4-5 hours. The numbness of her face and chest does not occur at the same time that she has numbness of the feet or legs. 3. Pertinent Review of Systems:  Constitutional:  "I'm not doing well". "No energy".  Eyes: Vision is good long as she wears her glasses. There are no significant eye complaints.  Neck: The thyroid gland has not seemed to swell lately. The patient has no complaints of anterior neck soreness, tenderness,  pressure, discomfort, or difficulty swallowing.  Heart: Heart rate increases with exercise or other physical activity. The patient has no complaints of palpitations, irregular heat beats, chest pain, or chest pressure. Gastrointestinal: She has had some diarrhea  lately which she attributes to an acute gastroenteritis. The patient has no recent complaints of excessive hunger or acid reflux. Legs: Muscle mass and strength seem normal. Legs tire easily. She just can't walk very much anymore. She has the episodic leg numbness as noted above. She also notes that her shins are often tender. Feet: She has the episodic foot numbness as noted above. No edema is noted. GYN: Patient remains on Premarin, 0.625 mg per day.   PAST  MEDICAL, FAMILY, AND SOCIAL HISTORY:  Past Medical History  Diagnosis Date  . Hypothyroidism, acquired, autoimmune   . Fibromyalgia syndrome   . Thyroiditis, autoimmune   . Multinodular goiter (nontoxic)   . Tachycardia   . Familial tremor   . Coronary artery spasm   . Dyspepsia   . Fatigue   . Combined hyperlipidemia   . Dupuytren's contracture     Family History  Problem Relation Age of Onset  . Cancer Mother   . Thyroid disease Neg Hx   . Diabetes Neg Hx     Current outpatient prescriptions:DULoxetine (CYMBALTA) 20 MG capsule, Take 60 mg by mouth daily. , Disp: , Rfl: ;  enalapril (VASOTEC) 2.5 MG tablet, Take 2.5 mg by mouth daily.  , Disp: , Rfl: ;  estrogens, conjugated, (PREMARIN) 0.625 MG tablet, Take 0.625 mg by mouth daily. Take daily for 21 days then do not take for 7 days. , Disp: , Rfl:  levothyroxine (SYNTHROID, LEVOTHROID) 125 MCG tablet, Take 88 mcg by mouth daily. Brand Name Synthroid Only, Disp: , Rfl: ;  Multiple Vitamin (MULTIVITAMIN) tablet, Take 1 tablet by mouth daily.  , Disp: , Rfl:   Allergies as of 11/21/2010 - Review Complete 11/21/2010  Allergen Reaction Noted  . Amoxicillin  06/18/2010  . Lorabid (loracarbef)  06/18/2010  . Sulfa antibiotics  06/18/2010    1. Work and Family: Patient's work schedule has been reduced to 20 hours per week to her problems walking. 2. Activities: Physical activities are restricted due to the pain and fatigue. She does try to walk daily.  3. Smoking, alcohol, or drugs: None 4. Primary Care Provider: Dr. Merri Brunette, [facility]  ROS: There are no other significant problems involving Hitomi's other body systems.   Objective:  Vital Signs:  BP 111/72  Pulse 99  Wt 155 lb (70.308 kg)  PHYSICAL EXAMINATION  Constitutional: The patient looks pretty good overall. She is alert, bright, and upbeat.   Eyes: There is no obvious arcus or proptosis. Moisture appears normal. Mouth: The oropharynx  and tongue appear normal. Oral moisture is normal. Neck: The neck appears to be visibly normal. No carotid bruits are noted. The thyroid gland is about 20+ grams in size. The consistency of the thyroid gland is normal. The thyroid gland is not tender to palpation.  Lungs: The lungs are clear to auscultation. Air movement is good. Heart: Heart rate and rhythm are regular.Heart sounds S1 and S2 are normal. I did not appreciate any pathologic cardiac murmurs. Abdomen: The abdomen appears to be normal in size. Bowel sounds are normal. There is no obvious hepatomegaly, splenomegaly, or other mass effect.  Arms: Muscle size and bulk are normal for age. Hands: There is no obvious tremor. Phalangeal and metacarpophalangeal joints are normal. Palmar muscles are normal. Palmar skin is normal. Palmar moisture is also normal. Legs: Muscles appear normal for age. No edema is present. She has pain to palpation in the distal calf. Feet: Feet are normally formed. The  right dorsalis pedal pulse is normal 1+. The left DP pulse is normal 1+.  Neurologic: CN II-XII are normal.Strength is low-normal for age in both the upper and lower extremities. Muscle tone is normal. Sensation to touch is normal in the legs. Sensation in the feet is not painful today. Sensation to touch is mildly decreased in the left heel.    LAB DATA: 07/29/10: 24 hour urine: Metanephrines were 58 normal 90-3:15). Nor metanephrines were 678 (normal 122-676). Total metanephrines were 736(normal 224-832). Epinephrine was 3 (normal 2 -- 24). Norepinephrine was 116 (normal 15-100). Total norepinephrine plus epinephrine was 119 (normal 26-121). Dopamine was 274 (normal 52-480). VMA was 5.2 (normal less than 6.0).    Serum LH 24.4. FSH 24.8, estradiol 64.8   Assessment and Plan:   ASSESSMENT:  1. Hypothyroid: She is currently euthyroid. Although her free T4 and free T3 levels are normal, they have dropped by about 8% since February. Her TSH has  appropriately increased from 1.943 to 2.27. We will continue to follow her TFTs serially. 2. Thyroiditis: The patient Hashimoto's disease has been clinically quiescent recently. I still suspect she will lose more thyroid cells in the next 2-3 years. 3. Headaches: Headaches appear to be "tension" headaches. She definitely has a large degree of trapezius muscle and nuchal cord tightness and tenderness. 4. Goiter: Thyroid gland has increased slightly in size since the last visit, likely due to Hashimoto's disease activity. 5. Night sweats: The night sweats are likely due to autonomic nervous system dysfunction associated with menopause.   A. The LH and FSH are about mid-normal for a woman who is on mid-dose estrogen replacement. Many women with these levels of hormones do not have night sweats, but some do.   B. The 24-hour urine test was inconclusive. The epinephrine and metanephrine were low-normal. The dopamine and VMA were at the upper end of the normal range. The norepinephrine and normetanephrine were just slightly above the upper limit of normal. If she had a pheo, I would expect most, if not all of these tests to be significantly elevated. I've asked her to abstain from all OTC sinus and allergy meds for one week and from all caffeine for 48 hours, then repeat the 24-hour urine studies.  6. Numbness: Is she having migraine equivalents? Does she have an unknown BG problem. 7. Leg pains: Does she have hyperparathyroidism, primary or secondary? What is her Vitamin D status?  8. Fatigue: While it is tempting to ascribe many of her somatic complaints to Fibromyalgia Syndrome, does she have something else going on?  PLAN:  1. Diagnostic: Repeat 24-hour urine for VMA, metanephrines, nor metanephrines and catecholamines. Obtain 25-Vitamin D, 1,25-Vitamin d, calcium, PTH, Vitamin B6, vitamin B12. 2. Therapeutic: Do her best to continue daily physical activity. Continue current dose of Synthroid.  3.  Patient education: We discussed the issue of her night sweats most likely being due to post-menopausal activity of the hypothalamus. We also discussed the results of her pheo work-up.  4. Follow-up: Return in about 3 months (around 02/20/2011).  Level of Service: This visit lasted in excess of 40 minutes. More than 50% of the visit was devoted to counseling.

## 2010-12-21 NOTE — Progress Notes (Signed)
Subjective:  Patient Name: Shelby Lin Date of Birth: May 07, 1940  MRN: [id number]  Shelby Lin  presents to the office today for follow-up of her hypothyroidism secondary to Hashimoto's disease, multinodular goiter, tachycardia, tremor, dyspepsia, fatigue, hyperlipidemia, and tremor.  HISTORY OF PRESENT ILLNESS:   Shelby Lin is a 70 y.o. Caucasian woman.  Shelby Lin was unaccompanied.  1. Patient was referred to me on 03/25/2005 by her primary care provider, Dr. Merri Brunette, for evaluation of her thyroid problems. Patient was 64 at that time. At about age 52 the patient developed thyroid problems and was put on a thyroid medicine. She cannot remember the name of that medicine. Later when she became pregnant her obstetrician took her off that medicine. At about age 62, she developed severe fatigue and weight gain. She was put back on Synthroid. Her dose of Synthroid was 88 mcg per day for many years. In January 2006, she had fluctuations in her thyroid function tests. Synthroid was increased to 100 mcg and later to 125 mcg per day. The Synthroid dose was then reduced in July 6 09/09/1998 to 75 mcg. After her most recent blood test about 4 months ago, the patient was put back on a dose of 88 mcg per day. When the patient was having a fluctuations in her thyroid tests, her thyroid gland became visibly enlarged and felt full and different to palpation. She was also more hoarse during that period. Hoarseness comes and goes in parallel with the thyroid gland swelling. Ultrasound studies of the thyroid in January 2006 and January 2007 showed multinodular goiter.  Thyroid function tests performed at that first visit showed a TSH of 4.139, free T4-1 0.22, and free T3 of 2.7. Because any TSH at that time greater than 3 was really high, I increased her Synthroid from 88-100 mcg per day. TPO antibody was markedly positive at 954.0. TSI was quite normal at 0.9.  2. During the last 5 years the patient has had several  flareups of Hashimoto's disease. In February 2011 we had to increase her Synthroid dose to 125 mcg per day. Later that year we reduced the dose to 125 mcg 5 days per week. As of her last [facility] visit on 04/23/10, she was euthyroid with a Synthroid dose of 88 mcg 6 days per week. In the interim, her rheumatologist, Dr. Juanetta Gosling, to put her back on vitamin D because her previous vitamin D level was low. Her CRP was elevated at 41 (normal less than or equal to 4.9). Her ESR was mildly elevated at 25 (normal less than 20). Her ANA was negative. She also had an EGD for evaluation of indigestion and GERD. That study was normal. She has been having night sweats frequently. She has also been having headaches frequently. Her headaches are localized to the vertex,  But sometimes involve the nuchal cords. 3. Pertinent Review of Systems:  Constitutional: The patient feels bad, tired, and apathetic. Her energy is low.  Eyes: Vision is good long as she wears her glasses. There are no significant eye complaints. Neck: The thyroid gland occasionally looks full to her when she examines herself in the mirror. The patient has no complaints of anterior neck soreness, tenderness,  pressure, discomfort, or difficulty swallowing.  Heart: Heart rate increases with exercise or other physical activity. The patient has no complaints of palpitations, irregular heat beats, chest pain, or chest pressure. Gastrointestinal: She still has flareups of what has been called either IBS or colitis. Bowel movents seem normal otherwise. The patient  has no recent complaints of excessive hunger or acid reflux. Legs: Muscle mass and strength seem normal. Legs tire easily. She just can't walk very much anymore. There are no complaints of numbness, tingling, burning, or pain. No edema is noted. Feet: Feet often hurt. There are no complaints of numbness, tingling, or burning. No edema is noted. GYN: Patient remains on Premarin, 0.625 mg per day.   PAST  MEDICAL, FAMILY, AND SOCIAL HISTORY:  Past Medical History  Diagnosis Date  . Hypothyroidism, acquired, autoimmune   . Fibromyalgia syndrome   . Thyroiditis, autoimmune   . Multinodular goiter (nontoxic)   . Tachycardia   . Familial tremor   . Coronary artery spasm   . Dyspepsia   . Fatigue   . Combined hyperlipidemia   . Dupuytren's contracture     Family History  Problem Relation Age of Onset  . Cancer Mother   . Thyroid disease Neg Hx   . Diabetes Neg Hx     Current outpatient prescriptions:DULoxetine (CYMBALTA) 20 MG capsule, Take 60 mg by mouth daily. , Disp: , Rfl: ;  enalapril (VASOTEC) 2.5 MG tablet, Take 2.5 mg by mouth daily.  , Disp: , Rfl: ;  estrogens, conjugated, (PREMARIN) 0.625 MG tablet, Take 0.625 mg by mouth daily. Take daily for 21 days then do not take for 7 days. , Disp: , Rfl:  levothyroxine (SYNTHROID, LEVOTHROID) 125 MCG tablet, Take 88 mcg by mouth daily. Brand Name Synthroid Only, Disp: , Rfl: ;  Multiple Vitamin (MULTIVITAMIN) tablet, Take 1 tablet by mouth daily.  , Disp: , Rfl:   Allergies as of 07/24/2010 - Review Complete 06/18/2010  Allergen Reaction Noted  . Amoxicillin  06/18/2010  . Lorabid (loracarbef)  06/18/2010  . Sulfa antibiotics  06/18/2010    1. Work and Family: Patient's work schedule has been reduced to 20 hours per week to her problems walking. 2. Activities: Physical activities are restricted due to the pain and fatigue. 3. Smoking, alcohol, or drugs: None 4. Primary Care Provider: Dr. Merri Brunette, [facility]  ROS: She has a new ganglion cyst of the medial tendon. She is not having any further galactorrhea. There are no other significant problems involving Sanika's other body systems.   Objective:  Vital Signs:  BP 130/74  Pulse 91  Wt 156 lb 8 oz (70.988 kg)  PHYSICAL EXAMINATION  Constitutional: The patient appears healthier and more energetic than she feels.   Eyes: There is no obvious arcus  or proptosis. Moisture appears normal. Mouth: The oropharynx and tongue appear normal. Oral moisture is normal. Neck: The neck appears to be visibly normal. No carotid bruits are noted. The thyroid gland is about 20 grams in size. The consistency of the thyroid gland is normal. The thyroid gland is not tender to palpation.  She has a tender right nuchal cord. The range of motion in her neck is somewhat limited. The trapezius muscles are very tight on both sides.  Lungs: The lungs are clear to auscultation. Air movement is good. Heart: Heart rate and rhythm are regular.Heart sounds S1 and S2 are normal. I did not appreciate any pathologic cardiac murmurs. Abdomen: The abdomen appears to be normal in size. Bowel sounds are normal. There is no obvious hepatomegaly, splenomegaly, or other mass effect.  Arms: Muscle size and bulk are normal for age. Hands: There is no obvious tremor. Phalangeal and metacarpophalangeal joints are normal. Palmar muscles are normal. Palmar skin is normal. Palmar moisture is also  normal. Legs: Muscles appear normal for age. No edema is present. She has pain to palpation in the distal calf. Feet: Feet are normally formed. The right dorsalis pedal pulse is normal 1+. The left DP pulse is normal 2+.  Neurologic: Strength is low-normal for age in both the upper and lower extremities. Muscle tone is normal. Sensation to touch is normal in the legs. The feet are very painful to palpation.    LAB DATA: 07/11/10: TSH was 2.27. Free T4 was 1.09. Free T3 was 2.7.    Assessment and Plan:   ASSESSMENT:  1. Hypothyroid: She is currently euthyroid. Although her free T4 and free T3 levels are normal, they have droppedby about 8% since February. Her TSH has appropriately increased from 1.943 to 2.27. 2. thyroiditis: The patient Hashimoto's disease is clinically quiescent today, but has been more active at different times. I suspect she will lose more thyroid cells in the next 2-3  years. 3. headaches: Headaches appear to be "tension" headaches. She definitely has a large degree of trapezius muscle and nuchal cord tightness and tenderness. 4. goiter: Thyroid gland remains just at the upper limit of normal size. 5. nitrites: The night sweats are likely due to autonomic nervous system dysfunction associated with menopause. Although it's unlikely that she has a pheochromocytoma, we should rule this out.   PLAN:  1. Diagnostic: 24-hour urine for VMA, metanephrines, nor metanephrines and catecholamines. Also obtain an LH/FSH and estradiol. 2. Therapeutic: I taught her how to do C-spine stretching exercises. She may need to obtain a consult for physical therapy from Dr. Katrinka Blazing. 3. Patient education: We discussed the issue of cervical spine arthritis and impingement upon the cervical nerves. She will likely need to include stretching exercise as part of her daily activities. 4. Follow-up: Return in about 4 months (around 11/24/2010).  Level of Service: This visit lasted in excess of 40 minutes. More than 50% of the visit was devoted to counseling.

## 2010-12-23 ENCOUNTER — Other Ambulatory Visit: Payer: Self-pay | Admitting: "Endocrinology

## 2010-12-23 ENCOUNTER — Other Ambulatory Visit: Payer: Self-pay | Admitting: *Deleted

## 2010-12-26 LAB — CATECHOLAMINES, FRACTIONATED, URINE, 24 HOUR
Dopamine, 24 hr Urine: 236 mcg/24 h (ref 52–480)
Epinephrine, 24 hr Urine: 4 mcg/24 h (ref 2–24)
Norepinephrine, 24 hr Ur: 102 mcg/24 h — ABNORMAL HIGH (ref 15–100)
Total Volume - CF 24Hr U: 600 mL

## 2010-12-28 LAB — METANEPHRINES, URINE, 24 HOUR
Metanephrines, Ur: 31 mcg/24 h — ABNORMAL LOW (ref 90–315)
Normetanephrine, 24H Ur: 495 mcg/24 h (ref 122–676)

## 2011-01-26 ENCOUNTER — Other Ambulatory Visit: Payer: Self-pay | Admitting: "Endocrinology

## 2011-02-24 ENCOUNTER — Telehealth: Payer: Self-pay | Admitting: *Deleted

## 2011-02-24 ENCOUNTER — Telehealth: Payer: Self-pay | Admitting: "Endocrinology

## 2011-02-24 ENCOUNTER — Other Ambulatory Visit: Payer: Self-pay | Admitting: *Deleted

## 2011-02-24 ENCOUNTER — Telehealth: Payer: Self-pay | Admitting: Pediatrics

## 2011-02-24 DIAGNOSIS — E039 Hypothyroidism, unspecified: Secondary | ICD-10-CM

## 2011-02-24 NOTE — Telephone Encounter (Signed)
Please see my telephine note below. 1. Patient's norepinephrine was better than in June now 102 (Nl 15-100). Her normetanephrines at 495 are well WNL (Nl 122-676). Her total metanephrines were 536 (Nl 224-832). 2. Patient had not yet had her semiannual TFTs done. Our nurse made out lab slips. The patient will come by to pick them up.

## 2011-02-24 NOTE — Telephone Encounter (Signed)
See note below

## 2011-02-24 NOTE — Telephone Encounter (Signed)
Transferred patient to Louretta Parma, RN

## 2011-03-05 ENCOUNTER — Ambulatory Visit: Payer: Medicare Other | Admitting: "Endocrinology

## 2011-05-02 ENCOUNTER — Other Ambulatory Visit: Payer: Self-pay | Admitting: Family Medicine

## 2011-05-02 DIAGNOSIS — Z780 Asymptomatic menopausal state: Secondary | ICD-10-CM

## 2011-05-12 ENCOUNTER — Encounter: Payer: Self-pay | Admitting: "Endocrinology

## 2011-05-12 ENCOUNTER — Ambulatory Visit (INDEPENDENT_AMBULATORY_CARE_PROVIDER_SITE_OTHER): Payer: Medicare Other | Admitting: "Endocrinology

## 2011-05-12 VITALS — BP 150/75 | HR 95 | Wt 153.0 lb

## 2011-05-12 DIAGNOSIS — M79609 Pain in unspecified limb: Secondary | ICD-10-CM

## 2011-05-12 DIAGNOSIS — E038 Other specified hypothyroidism: Secondary | ICD-10-CM

## 2011-05-12 DIAGNOSIS — R5383 Other fatigue: Secondary | ICD-10-CM

## 2011-05-12 DIAGNOSIS — G44229 Chronic tension-type headache, not intractable: Secondary | ICD-10-CM

## 2011-05-12 DIAGNOSIS — E063 Autoimmune thyroiditis: Secondary | ICD-10-CM

## 2011-05-12 DIAGNOSIS — M79606 Pain in leg, unspecified: Secondary | ICD-10-CM

## 2011-05-12 DIAGNOSIS — E049 Nontoxic goiter, unspecified: Secondary | ICD-10-CM

## 2011-05-12 DIAGNOSIS — R5381 Other malaise: Secondary | ICD-10-CM

## 2011-05-12 NOTE — Progress Notes (Signed)
Subjective:  Patient Name: Shelby Lin Date of Birth: [birth year]  MRN: [id number]  Shelby Lin  presents to the office today for follow-up of her hypothyroidism secondary to Hashimoto's disease, multinodular goiter, tachycardia, tremor, dyspepsia, fatigue, hyperlipidemia, night sweats, and tremor.  HISTORY OF PRESENT ILLNESS:   Shelby Lin is a 71 y.o. Caucasian woman.  [name] was unaccompanied.  1. Patient was referred to me on 03/25/2005 by her primary care provider, Dr. Merri Brunette, for evaluation of her thyroid problems. Patient was 64 at that time. At about age 64 the patient developed thyroid problems and was put on a thyroid medicine. Later when she became pregnant her obstetrician took her off that medicine. At about age 67, she developed severe fatigue and weight gain. She was put back on Synthroid. Her dose of Synthroid was 88 mcg per day for many years. In January 2006, she had fluctuations in her thyroid function tests. Synthroid was increased to 100 mcg and later to 125 mcg per day. The Synthroid dose was then reduced in July 2000 to 75 mcg. After her most recent blood test about 4 months ago, the patient was put back on a dose of 88 mcg per day. When the patient was having fluctuations in her thyroid tests, her thyroid gland became visibly enlarged and felt full and tender to palpation. She was also more hoarse during that period. Hoarseness comes and goes in parallel with the thyroid gland swelling. Ultrasound studies of the thyroid in January 2006 and January 2007 showed multinodular goiter.  Thyroid function tests performed at that first visit showed a TSH of 4.139, free T4 1.22, and free T3 of 2.7. Because any TSH at that time greater than 3 was really high, I increased her Synthroid from 88 to 100 mcg per day. TPO antibody was markedly positive at 954.0. TSI was quite normal at 0.9.  2. During the last 5 years the patient has had several flare-ups of Hashimoto's disease. In February 2011  we had to increase her Synthroid dose to 125 mcg per day. Later that year we reduced the dose to 125 mcg 5 days per week.  3. The patient's last [facility] visit was on 11/21/10. In the interim, Dr Merri Brunette checked her TFTs about a month ago and they were "OK". She remains on Synthroid, 88 mcg/day. In the past two weeks she had severe vertigo and was treated with two tapering courses of prednisone. Vertigo was accompanied by dreadful headaches. She has headaches almost every day. In retrospect, these HAs have been occuring chronically for many years. The HAs are mostly fronto-temporal, but are often associated with pain tin the nuchal area and trapezius areas. Her neck feels tight a lot posteriorly. She takes tramadol, but that medication does not make her feel good. She does not take in much caffeine. She continues to have night sweats 3-4 nights per week. She also has sweats occasionally during the day. Her right-sided numbness spontaneously resolved. She has been taking meclizine prn. The patient has asked Dr. Katrinka Blazing to taper and stop Cymbalta due to expense.  If her FMS flares up, they will try Effexor. The patient has had some new cystic structures of the dorsum of her left thumb recently.  4. Pertinent Review of Systems:  Constitutional:  "Pretty good.". "Gives out real quickly".  Eyes: Vision is good long as she wears her glasses. She had an eye exam about 10 days ago. She will obtain new glasses soon.There are no significant eye complaints.  Neck:  The thyroid gland has not seemed to swell lately. The patient has no complaints of anterior neck soreness, tenderness,  pressure, discomfort, or difficulty swallowing.  Heart: Heart rate increases with exercise or other physical activity. The patient has no complaints of palpitations, irregular heat beats, chest pain, or chest pressure. Gastrointestinal: She has diarrhea occasionally. The patient has problems with acid reflux and dyspepsia. Symptoms are  controlled on omeprazole, but worsen off omeprazole. [place] food still gets to her at times.   Legs: Muscle mass and strength seem normal. Legs tire easily. She just can't walk very much anymore. She has the episodic leg numbness as noted above. She also notes that her shins are often tender. Feet: She has the episodic foot numbness as noted above. No edema is noted. GYN: Patient remains on Premarin, 0.625 mg per day. Emotional/psychiatric: OK   PAST MEDICAL, FAMILY, AND SOCIAL HISTORY:  Past Medical History  Diagnosis Date  . Hypothyroidism, acquired, autoimmune   . Fibromyalgia syndrome   . Thyroiditis, autoimmune   . Multinodular goiter (nontoxic)   . Tachycardia   . Familial tremor   . Coronary artery spasm   . Dyspepsia   . Fatigue   . Combined hyperlipidemia   . Dupuytren's contracture     Family History  Problem Relation Age of Onset  . Cancer Mother   . Thyroid disease Neg Hx   . Diabetes Neg Hx     Current outpatient prescriptions:DULoxetine (CYMBALTA) 20 MG capsule, Take 60 mg by mouth daily. , Disp: , Rfl: ;  enalapril (VASOTEC) 2.5 MG tablet, Take 2.5 mg by mouth daily.  , Disp: , Rfl: ;  estrogens, conjugated, (PREMARIN) 0.625 MG tablet, Take 0.625 mg by mouth daily. Take daily for 21 days then do not take for 7 days. , Disp: , Rfl: ;  Multiple Vitamin (MULTIVITAMIN) tablet, Take 1 tablet by mouth daily.  , Disp: , Rfl:  SYNTHROID 88 MCG tablet, TAKE 1 TABLET EVERY DAY, Disp: 30 tablet, Rfl: 6  Allergies as of 05/12/2011 - Review Complete 05/12/2011  Allergen Reaction Noted  . Amoxicillin  06/18/2010  . Lorabid (loracarbef)  06/18/2010  . Sulfa antibiotics  06/18/2010    1. Work and Family: Patient continues to work part-time 20 hours per week. 2. Activities: Physical activities are restricted due to the pain and fatigue. She does try to walk daily.  3. Smoking, alcohol, or drugs: None 4. Primary Care Provider: Dr. Merri Brunette, [facility]  ROS: There are no other significant problems involving Chen's other body systems.   Objective:  Vital Signs:  BP 150/75  Pulse 95  Wt 153 lb (69.4 kg) BP was 122/73 at Dr. Michaelle Copas office last week.  PHYSICAL EXAMINATION  Constitutional: The patient looks pretty good overall. She is alert, bright, and upbeat.   Eyes: There is no obvious arcus or proptosis. Moisture appears normal. Mouth: The oropharynx and tongue appear normal. Oral moisture is normal. Neck: The neck appears to be visibly normal. No carotid bruits are noted. The thyroid gland is about 20 grams in size. The consistency of the thyroid gland is normal. The thyroid gland is not tender to palpation. She has markedly decreased range of motion of her cervical spine. She has nuchal cord tightness and discomfort. She has trapezius muscle tenderness and stiffness. Lungs: The lungs are clear to auscultation. Air movement is good. Heart: Heart rate and rhythm are regular.Heart sounds S1 and S2 are normal. I did not appreciate  any pathologic cardiac murmurs. Abdomen: The abdomen is somewhat enlarged. Bowel sounds are normal. There is no obvious hepatomegaly, splenomegaly, or other mass effect.  Arms: Muscle size and bulk are normal for age. Hands: There is no obvious tremor. Phalangeal and metacarpophalangeal joints are normal. She has multiple cystic nodules of the palms and left thumb. The cystic nodules of the left thumb are quite painful. She is due to see a hand surgeon soon. Palmar muscles are normal. Palmar skin is normal. Palmar moisture is also normal. Legs: Muscles appear normal for age. No edema is present. She has pain to palpation in the distal shins. Neurologic: CN II-XII are normal.Strength is low-normal for age in both the upper and lower extremities. Muscle tone is normal. Sensation to touch is normal in the legs.   LAB DATA: 11/21/10: 24 hour urine: Metanephrines were 31 (normal 90-315). Normetanephrines were  495 (normal 122-676). Total metanephrines were 526 (normal 224-832). Epinephrine was 4 (normal 2- 24). Norepinephrine was 102 (normal 15-100).  Dopamine was 236 (normal 52-480). Calcium was 9.5. Her PTH was 37.6. 25 hydroxy vitamin D was 41. 1, 25 hydroxy vitamin D was 85, which was slightly elevated. B12 was 352 (211-911)     Assessment and Plan:   ASSESSMENT:  1. Hypothyroid: She was euthyroid in May 2012 and more recently in 2013. We need to see her lab results from Dr. Katrinka Blazing. We will continue to follow her TFTs serially. 2. Thyroiditis: The patient's Hashimoto's disease has been clinically quiescent recently. I still suspect she will lose more thyroid cells in the next 2-3 years. 3. Headaches: Headaches appear to be "tension" headaches. She definitely has a large degree of trapezius muscle and nuchal cord tightness and tenderness. 4. Goiter: Thyroid gland has decreased slightly in size since the last visit, likely due to quiescence of her Hashimoto's disease activity. 5. Night sweats: The night sweats are likely due to autonomic nervous system dysfunction associated with menopause.   A. The repeat 24-hour urine test was inconclusive. The epinephrine and metanephrine were low-normal. The dopamine was at the upper end of the normal range. The norepinephrine and normetanephrine were just slightly above the upper limit of normal.  B. If she had a pheo, I would expect most, if not all of these tests to be significantly elevated. She has no evidence for a pheo.  6. Numbness: resolved 7. Leg pains: Calcium and parathyroid hormone levels are normal. Vitamin D level is normal. 1, 25 vitamin D is slightly high. 8. Headache: Probable tension HA. 9. Fatigue: Fatigue does sound like FMS.  PLAN:  1. Diagnostic: None today. Will repeat TFTs and calcium/ PTH, and vitamin D levels 2 weeks before the next visit.  2. Therapeutic: Do her best to continue daily physical activity. Continue current dose of  Synthroid.  3. Patient education:We discussed the pathophysiology of tension headaches. I taught her how to do the cervical stretching exercises. 4. Follow-up: 5 months  Level of Service: This visit lasted in excess of 40 minutes. More than 50% of the visit was devoted to counseling.  David Stall

## 2011-05-12 NOTE — Patient Instructions (Signed)
Followup visit in 5 months. Have labs done 2 weeks prior to next visit

## 2011-05-13 ENCOUNTER — Ambulatory Visit
Admission: RE | Admit: 2011-05-13 | Discharge: 2011-05-13 | Disposition: A | Discharge status: Home / Self Care | Payer: Medicare Other | Source: Ambulatory Visit | Attending: Family Medicine | Admitting: Family Medicine

## 2011-05-13 DIAGNOSIS — Z780 Asymptomatic menopausal state: Secondary | ICD-10-CM

## 2011-05-19 ENCOUNTER — Other Ambulatory Visit: Payer: Self-pay | Admitting: Otolaryngology

## 2011-05-19 DIAGNOSIS — H905 Unspecified sensorineural hearing loss: Secondary | ICD-10-CM

## 2011-05-20 ENCOUNTER — Ambulatory Visit
Admission: RE | Admit: 2011-05-20 | Discharge: 2011-05-20 | Disposition: A | Discharge status: Home / Self Care | Payer: Medicare Other | Source: Ambulatory Visit | Attending: Otolaryngology | Admitting: Otolaryngology

## 2011-05-20 DIAGNOSIS — H905 Unspecified sensorineural hearing loss: Secondary | ICD-10-CM

## 2011-05-20 MED ORDER — GADOBENATE DIMEGLUMINE 529 MG/ML IV SOLN
14.0000 mL | Freq: Once | INTRAVENOUS | Status: AC | PRN
  Administered 2011-05-20: 14 mL via INTRAVENOUS

## 2011-07-09 ENCOUNTER — Other Ambulatory Visit (INDEPENDENT_AMBULATORY_CARE_PROVIDER_SITE_OTHER): Payer: Self-pay | Admitting: Otolaryngology

## 2011-08-07 ENCOUNTER — Other Ambulatory Visit (HOSPITAL_COMMUNITY): Payer: Self-pay | Admitting: Family Medicine

## 2011-08-07 DIAGNOSIS — R0602 Shortness of breath: Secondary | ICD-10-CM

## 2011-08-13 ENCOUNTER — Encounter (HOSPITAL_COMMUNITY)
Admission: RE | Admit: 2011-08-13 | Discharge: 2011-08-13 | Disposition: A | Discharge status: Home / Self Care | Payer: Medicare Other | Source: Ambulatory Visit | Attending: Family Medicine | Admitting: Family Medicine

## 2011-08-13 DIAGNOSIS — R0602 Shortness of breath: Secondary | ICD-10-CM | POA: Insufficient documentation

## 2011-08-13 LAB — PULMONARY FUNCTION TEST

## 2011-08-13 MED ORDER — ALBUTEROL SULFATE (5 MG/ML) 0.5% IN NEBU
2.5000 mg | INHALATION_SOLUTION | Freq: Once | RESPIRATORY_TRACT | Status: AC
  Administered 2011-08-13: 2.5 mg via RESPIRATORY_TRACT

## 2011-08-20 ENCOUNTER — Other Ambulatory Visit: Payer: Self-pay | Admitting: *Deleted

## 2011-08-20 DIAGNOSIS — E039 Hypothyroidism, unspecified: Secondary | ICD-10-CM

## 2011-08-25 DIAGNOSIS — H905 Unspecified sensorineural hearing loss: Secondary | ICD-10-CM | POA: Insufficient documentation

## 2011-09-30 LAB — T3, FREE: T3, Free: 2.4 pg/mL (ref 2.3–4.2)

## 2011-09-30 LAB — TSH: TSH: 8.095 u[IU]/mL — ABNORMAL HIGH (ref 0.350–4.500)

## 2011-09-30 LAB — T4, FREE: Free T4: 1.16 ng/dL (ref 0.80–1.80)

## 2011-10-03 LAB — VITAMIN D 1,25 DIHYDROXY: Vitamin D 1, 25 (OH)2 Total: 95 pg/mL — ABNORMAL HIGH (ref 18–72)

## 2011-10-09 ENCOUNTER — Ambulatory Visit (INDEPENDENT_AMBULATORY_CARE_PROVIDER_SITE_OTHER): Payer: Medicare Other | Admitting: "Endocrinology

## 2011-10-09 ENCOUNTER — Encounter: Payer: Self-pay | Admitting: "Endocrinology

## 2011-10-09 VITALS — BP 115/68 | HR 73 | Wt 155.8 lb

## 2011-10-09 DIAGNOSIS — R5381 Other malaise: Secondary | ICD-10-CM

## 2011-10-09 DIAGNOSIS — R61 Generalized hyperhidrosis: Secondary | ICD-10-CM

## 2011-10-09 DIAGNOSIS — R499 Unspecified voice and resonance disorder: Secondary | ICD-10-CM

## 2011-10-09 DIAGNOSIS — R51 Headache: Secondary | ICD-10-CM

## 2011-10-09 DIAGNOSIS — E063 Autoimmune thyroiditis: Secondary | ICD-10-CM

## 2011-10-09 DIAGNOSIS — R5383 Other fatigue: Secondary | ICD-10-CM

## 2011-10-09 DIAGNOSIS — E038 Other specified hypothyroidism: Secondary | ICD-10-CM

## 2011-10-09 DIAGNOSIS — E049 Nontoxic goiter, unspecified: Secondary | ICD-10-CM

## 2011-10-09 DIAGNOSIS — G8929 Other chronic pain: Secondary | ICD-10-CM

## 2011-10-09 DIAGNOSIS — R498 Other voice and resonance disorders: Secondary | ICD-10-CM

## 2011-10-09 NOTE — Patient Instructions (Addendum)
Follow up visit in 6 months. Please have thyroid blood tests drawn in one month. Please have all lab tests drawn two weeks prior to next visit.

## 2011-10-09 NOTE — Progress Notes (Signed)
Subjective:  Patient Name: Shelby Lin Date of Birth: 03/11/1940  MRN: [id number]  Myrel Rappleye  presents to the office today for follow-up of her hypothyroidism secondary to Hashimoto's disease, multinodular goiter, tachycardia, tremor, dyspepsia, fatigue, hyperlipidemia, night sweats, and tremor.  HISTORY OF PRESENT ILLNESS:   [name] is a 71 y.o. Caucasian woman.  Ezmeralda was unaccompanied.  1. Patient was referred to me on 03/25/2005 by her primary care provider, Dr. Merri Brunette, for evaluation of her thyroid problems. Patient was 64 at that time. At about age 54 the patient developed thyroid problems and was put on a thyroid medicine. Later when she became pregnant her obstetrician took her off that medicine. At about age 87, she developed severe fatigue and weight gain. She was put back on Synthroid. Her dose of Synthroid was 88 mcg per day for many years. In January 2006, she had fluctuations in her thyroid function tests. Synthroid was increased to 100 mcg and later to 125 mcg per day. The Synthroid dose was then reduced to 75 mcg. After her blood tests about 9 months ago, the patient was put back on a dose of 88 mcg per day. When the patient was having fluctuations in her thyroid tests, her thyroid gland became visibly enlarged and felt full and tender to palpation. She was also more hoarse during that period. Hoarseness comes and goes in parallel with the thyroid gland swelling. Ultrasound studies of the thyroid in January 2006 and January 2007 showed multinodular goiter.  Thyroid function tests performed at that first visit showed a TSH of 4.139, free T4 1.22, and free T3 of 2.7. Because any TSH at that time greater than 3 was really high, I increased her Synthroid from 88 to 100 mcg per day. TPO antibody was markedly positive at 954.0. TSI was quite normal at 0.9.  2. During the last 6 years the patient has had several flare-ups of Hashimoto's disease. In February 2011 we had to increase her  Synthroid dose to 125 mcg per day. Later that year we reduced the dose to 125 mcg 5 days per week and later to 88 mcg/day 6 days per week.   3. The patient's last [facility] visit was on 05/12/10. In the interim, she has had a" bad, bad 6 months".   A. Just before her last visit with me she developed Meniere's disease of the left ear. ENT placed a PE tube  and received topical steroids through the tube.  Her hearing in the left ear improved somewhat, her vertigo resolved, but her head fullness continues. She no longer needs to take meclizine. She is on a low salt diet to reduce edema within the left ear.   B. She also developed shortness of breath, went to [facility], and was diagnosed with COPD. She has been started on Spiriva. Dr. Dayton Scrape is talking with her about being in a study for a new COPD medication.   C. She continues to have fronto-temporal headaches, occasionally involving the nuchal area and trapezius areas.   D. She remains on Synthroid, 88 mcg/day, 6 days per week.  E. The daytime sweats and night sweats have resolved.  Her right-sided numbness spontaneously resolved.   F. The patient had asked Dr. Katrinka Blazing to taper and stop Cymbalta due to expense. However, when she stopped the Cymbalta, she was both physically and emotionally worse. She resumed the Cymbalta 3 weeks ago at a lower dose of 60 mg/day. She has applied for their patient assistance program.  G. She still has three wens on her left thumb tendons.    H. She is intermittently, chronically hoarse. The hoarseness comes and goes. She has a lot of postnasal drip and some cough. She is not sure if her allergies affect the hoarseness or not.  4. Pertinent Review of Systems:  Constitutional:  "Pretty good.". "Gives out real quickly".  Eyes: Vision is good long as she wears her glasses. She had an eye exam about 10 days ago. She will obtain new glasses soon.There are no significant eye complaints.  Neck: The thyroid gland has not  seemed to swell lately. The patient has no complaints of anterior neck soreness, tenderness,  pressure, discomfort, or difficulty swallowing.  Heart: Heart rate increases with exercise or other physical activity. The patient has no complaints of palpitations, irregular heat beats, chest pain, or chest pressure. Gastrointestinal: Her diarrhea, acid reflux, and dyspepsia have resolved. She has stopped her omeprazole. She reduced her intake of [place] food.    Legs: Muscle mass and strength seem normal. Legs tire easily. She just can't walk very much anymore. She has the episodic leg numbness and burning, usually when she's been on her feet for a long time. She also notes that her shins are often tender. Feet: She has the episodic foot numbness at times. No edema is noted. GYN: Patient remains on Premarin, 0.625 mg per day. Emotional/psychiatric: I'm OK. Mental: She does well at thinking, paying attention, remembering, and making decisions.    PAST MEDICAL, FAMILY, AND SOCIAL HISTORY:  Past Medical History  Diagnosis Date  . Hypothyroidism, acquired, autoimmune   . Fibromyalgia syndrome   . Thyroiditis, autoimmune   . Multinodular goiter (nontoxic)   . Tachycardia   . Familial tremor   . Coronary artery spasm   . Dyspepsia   . Fatigue   . Combined hyperlipidemia   . Dupuytren's contracture     Family History  Problem Relation Age of Onset  . Cancer Mother   . Thyroid disease Neg Hx   . Diabetes Neg Hx     Current outpatient prescriptions:DULoxetine (CYMBALTA) 20 MG capsule, Take 60 mg by mouth daily. , Disp: , Rfl: ;  enalapril (VASOTEC) 2.5 MG tablet, Take 2.5 mg by mouth daily.  , Disp: , Rfl: ;  estrogens, conjugated, (PREMARIN) 0.625 MG tablet, Take 0.625 mg by mouth daily. Take daily for 21 days then do not take for 7 days. , Disp: , Rfl: ;  Multiple Vitamin (MULTIVITAMIN) tablet, Take 1 tablet by mouth daily.  , Disp: , Rfl:  SYNTHROID 88 MCG tablet, TAKE 1 TABLET EVERY DAY,  Disp: 30 tablet, Rfl: 6  Allergies as of 10/09/2011 - Review Complete 10/09/2011  Allergen Reaction Noted  . Amoxicillin  06/18/2010  . Lorabid (loracarbef)  06/18/2010  . Sulfa antibiotics  06/18/2010    1. Work and Family: She retired on June 21st. She still stays busy.  2. Activities: Physical activities are restricted due to the pain and fatigue. She does try to walk daily.  3. Smoking, alcohol, or drugs: None 4. Primary Care Provider: Dr. Merri Brunette, [facility]  ROS: There are no other significant problems involving Cerinity's other body systems.   Objective:  Vital Signs:  BP 115/68  Pulse 73  Wt 155 lb 12.8 oz (70.67 kg) She has gained 2 lbs since last visit.   PHYSICAL EXAMINATION  Constitutional: The patient looks pretty good overall. She is alert, bright, and surprisingly upbeat.  Eyes: There is no obvious arcus or proptosis. Moisture appears normal. Mouth: The oropharynx and tongue appear normal. Oral moisture is normal. Neck: The neck appears to be visibly normal. No carotid bruits are noted. The thyroid gland is about 20+ grams in size. The right lobe and isthmus are normal in size. The left lobe is mildly enlarged. The consistency of the thyroid gland is normal. The thyroid gland is not tender to palpation.  Lungs: The lungs are clear to auscultation. Air movement is good. Heart: Heart rate and rhythm are regular. Heart sounds S1 and S2 are normal. I did not appreciate any pathologic cardiac murmurs. Abdomen: The abdomen is somewhat enlarged. Bowel sounds are normal. There is no obvious hepatomegaly, splenomegaly, or other mass effect.  Arms: Muscle size and bulk are normal for age. Hands: There is no obvious tremor. Phalangeal and metacarpophalangeal joints are normal. She has multiple cystic nodules of the palms and left thumb. The cystic nodules of the left thumb are painful. Palmar muscles are normal. She has trace-1+ palmar erythema. Palmar  moisture is also normal. Legs: Muscles appear normal for age. No edema is present. She has pain to palpation in the distal shins. Neurologic: CN II-XII are normal.Strength is low-normal for age in both the upper and lower extremities. Muscle tone is normal. Sensation to touch is normal in the legs.   LAB DATA:  09/30/11: TSH 8.095, free T4 1.16, free T3 2.4. PTH 24, calcium 9.3, 25-hydroxy vitamin D 33, 1,25-dihydroxy vitamin D 95 11/21/10: Calcium was 9.5. Her PTH was 37.6. 25-hydroxy vitamin D was 41. 1, 25-dihydroxy vitamin D was 85, which was slightly elevated. B12 was 352 (211-911)     Assessment and Plan:   ASSESSMENT:  1. Hypothyroid: She was euthyroid in May 2012. Her recent labs however, indicate that she is hypothyroid by TSH, but euthyroid by free T4 and free T3. The most likely explanation for this discrepancy is that she has had a recent flare up of Hashimoto' disease. The fact that her thyroid gland is somewhat larger on this visit also supports the hypothesis of a recent flare up. If so, then when we repeat her TFTs in one month, she may return to a euthyroid state or may be more hypothyroid and require an increase in Synthroid dose.  We will continue to follow her TFTs serially. 2. Thyroiditis: The patient's Hashimoto's disease has been clinically quiescent, but probably silently active. I still suspect she will lose more thyroid cells in the next 2-3 years. 3. Headaches: Previous headaches appeared to be "tension" headaches. They have essentially resolved.  Her more recent HAs have usually been present upon awakening. This type of HA may be due to poor neck alignment during sleep.  4. Goiter: Thyroid gland has increased slightly in size since the last visit. The waxing and waning of thyroid gland size is c/w evolving Hashimoto's disease. 5. Night sweats and day sweats: The sweats have resolved. Her BP and pulse have remained normal. There is no indication of a pheochromocytoma.  .  6.  Leg pains: Calcium and parathyroid hormone levels are normal. Vitamin D level is normal. 1, 25 vitamin D is slightly higher.  7. Meniere's disease: Her vertigo has essentially resolved. Her hearing in the left ear is better. Her sensation of head fullness persists. It is likely that her environmental allergies are responsible for some serous fluid buildup in the ear.  8. Hoarseness: The fact that her hoarseness and post nasal drip come and go  somewhat in parallel indicate that the hoarseness is likely due to flare ups of her allergies.   PLAN:  1. Diagnostic: TFTs in one month. Will repeat TFTs and calcium/ PTH, and 25-hydroxy vitamin D levels 2 weeks before the next visit.  2. Therapeutic: Do her best to continue daily physical activity. Continue current dose of Synthroid and MVI and calcium. .  3. Patient education:We discussed the pathophysiology of headaches and Meniere's disease.  4. Follow-up: 6 months  Level of Service: This visit lasted in excess of 40 minutes. More than 50% of the visit was devoted to counseling.  David Stall

## 2011-10-14 ENCOUNTER — Other Ambulatory Visit: Payer: Self-pay | Admitting: Family Medicine

## 2011-10-14 DIAGNOSIS — Z1231 Encounter for screening mammogram for malignant neoplasm of breast: Secondary | ICD-10-CM

## 2011-10-31 ENCOUNTER — Ambulatory Visit
Admission: RE | Admit: 2011-10-31 | Discharge: 2011-10-31 | Disposition: A | Discharge status: Home / Self Care | Payer: Medicare Other | Source: Ambulatory Visit | Attending: Family Medicine | Admitting: Family Medicine

## 2011-10-31 DIAGNOSIS — Z1231 Encounter for screening mammogram for malignant neoplasm of breast: Secondary | ICD-10-CM

## 2011-11-21 LAB — T4, FREE: Free T4: 0.95 ng/dL (ref 0.80–1.80)

## 2011-11-27 ENCOUNTER — Telehealth: Payer: Self-pay | Admitting: "Endocrinology

## 2011-11-27 DIAGNOSIS — E063 Autoimmune thyroiditis: Secondary | ICD-10-CM

## 2011-11-27 NOTE — Telephone Encounter (Signed)
1. Patient called to request lab results. She had TFTs done a few days ago and is due to have her prescription refilled. She feels very tired, so supposes she will need a higher dose. 2. I returned her call. Indeed, she is much more hypothyroid. Her TSH has increased to 16 on her current dose of Synthroid, 88 mcg/day. She only takes synthroid in the AM. She takes her MVI with calcium and iron in the evening.   3. It appears that she has lost many more thyrocytes and so will need a substantial increase in Synthroid dose. I will call in a new prescription for Synthroid, 112 mcg tablets, one each AM, # 30, 6 refills, to the [facility] on [address] [phone]. 4. She will come in 6 weeks to have TFTs done. David Stall

## 2012-01-27 LAB — TSH: TSH: 3.335 u[IU]/mL (ref 0.350–4.500)

## 2012-01-27 LAB — T4, FREE: Free T4: 1.23 ng/dL (ref 0.80–1.80)

## 2012-01-27 LAB — T3, FREE: T3, Free: 2.2 pg/mL — ABNORMAL LOW (ref 2.3–4.2)

## 2012-01-30 ENCOUNTER — Other Ambulatory Visit: Payer: Self-pay | Admitting: *Deleted

## 2012-02-09 ENCOUNTER — Telehealth: Payer: Self-pay | Admitting: "Endocrinology

## 2012-02-09 NOTE — Telephone Encounter (Signed)
1. Patient called requesting results of her labs from 01/26/12. Labs 01/26/12: TSH 3.335, free T4 1.23, free T3 2.2 2. She is extremely tired and has a fairly constant headache. She also is cold a lot. Overall, however, she has improved since increasing her Synthroid dose to 112 mcg/day. 3. Assessment: She is still mildly hypothyroid by labs, but even more hypothyroid by symptoms.  4. Plan: Increase the Synthroid dose by adding 1/2 of a 112 mcg pill three days per week, example M-W-[id number]. Continue one 112 mcg pill per day for the other 4 days of the week. Repeat TFTs in 2 months. FU visit in late February. David Stall

## 2012-03-19 ENCOUNTER — Other Ambulatory Visit: Payer: Self-pay | Admitting: *Deleted

## 2012-03-19 DIAGNOSIS — E038 Other specified hypothyroidism: Secondary | ICD-10-CM

## 2012-04-02 LAB — T3, FREE: T3, Free: 2.9 pg/mL (ref 2.3–4.2)

## 2012-04-02 LAB — CALCIUM: Calcium: 9.5 mg/dL (ref 8.4–10.5)

## 2012-04-02 LAB — TSH: TSH: 1.073 u[IU]/mL (ref 0.350–4.500)

## 2012-04-06 ENCOUNTER — Ambulatory Visit (INDEPENDENT_AMBULATORY_CARE_PROVIDER_SITE_OTHER): Payer: Medicare Other | Admitting: "Endocrinology

## 2012-04-06 ENCOUNTER — Encounter: Payer: Self-pay | Admitting: "Endocrinology

## 2012-04-06 VITALS — BP 125/74 | HR 88 | Wt 157.7 lb

## 2012-04-06 DIAGNOSIS — G8929 Other chronic pain: Secondary | ICD-10-CM | POA: Insufficient documentation

## 2012-04-06 DIAGNOSIS — H8102 Meniere's disease, left ear: Secondary | ICD-10-CM

## 2012-04-06 DIAGNOSIS — E038 Other specified hypothyroidism: Secondary | ICD-10-CM

## 2012-04-06 DIAGNOSIS — G44229 Chronic tension-type headache, not intractable: Secondary | ICD-10-CM

## 2012-04-06 DIAGNOSIS — R61 Generalized hyperhidrosis: Secondary | ICD-10-CM

## 2012-04-06 DIAGNOSIS — E049 Nontoxic goiter, unspecified: Secondary | ICD-10-CM

## 2012-04-06 DIAGNOSIS — M79609 Pain in unspecified limb: Secondary | ICD-10-CM

## 2012-04-06 DIAGNOSIS — E063 Autoimmune thyroiditis: Secondary | ICD-10-CM

## 2012-04-06 DIAGNOSIS — H8109 Meniere's disease, unspecified ear: Secondary | ICD-10-CM

## 2012-04-06 NOTE — Patient Instructions (Signed)
Follow up visit in 6 months. Please try to walk daily for at least 30 minutes.

## 2012-04-06 NOTE — Progress Notes (Signed)
Subjective:  Patient Name: Shelby Lin Date of Birth: 27-Nov-1940  MRN: [id number]  Shelby Lin  presents to the office today for follow-up of her hypothyroidism secondary to Hashimoto's disease, multinodular goiter, tachycardia, tremor, dyspepsia, fatigue, hyperlipidemia, night sweats, and tremor.  HISTORY OF PRESENT ILLNESS:   Shelby Lin is a 72 y.o. Caucasian woman.  Shelby Lin was unaccompanied.  1. Patient was referred to me on 03/25/2005 by her primary care provider, Dr. Merri Brunette, for evaluation of her thyroid problems. Patient was 64 at that time. At about age 41 the patient developed thyroid problems and was put on a thyroid medicine. Later when she became pregnant her obstetrician took her off that medicine. At about age 5, she developed severe fatigue and weight gain. She was put back on Synthroid. Her dose of Synthroid was 88 mcg per day for many years. In January 2006, she had fluctuations in her thyroid function tests. Synthroid was increased to 100 mcg and later to 125 mcg per day. The Synthroid dose was then reduced to 75 mcg. After her blood tests about 9 months ago, the patient was put back on a dose of 88 mcg per day. When the patient was having fluctuations in her thyroid tests, her thyroid gland became visibly enlarged and felt full and tender to palpation. She was also more hoarse during that period. Hoarseness comes and goes in parallel with the thyroid gland swelling. Ultrasound studies of the thyroid in January 2006 and January 2007 showed multinodular goiter.  Thyroid function tests performed at that first visit showed a TSH of 4.139, free T4 1.22, and free T3 of 2.7. Because any TSH at that time greater than 3 was really high, I increased her Synthroid from 88 to 100 mcg per day. TPO antibody was markedly positive at 954.0. TSI was quite normal at 0.9.  2. During the last 6 years the patient has had several flare-ups of Hashimoto's disease. In February 2011 we had to increase her  Synthroid dose to 125 mcg per day. Later that year we reduced the dose to 125 mcg 5 days per week and later to 88 mcg/day 6 days per week.   3. The patient's last [facility] visit was on 10/09/11. In the interim, she has been better, both physically and psychologically.   A. Her Meniere's disease of the left ear is not any better. Her ENT doctor has advised doing surgery on her left mastoid area, but she has declined. The hearing in the left ear varies. Her head fullness continues, especially on the left side. Her vertigo has improved to the point that she no longer needs to take meclizine. She is on a low salt diet to reduce edema within the left ear.   B. Her COPD is better. She does her breathing exercises every day.   C. She continues to have fronto-temporal headaches, occasionally involving the nuchal area and trapezius areas. Her trapezius areas remain tight and tender, but are better. The cervical stretching exercises help a lot.   D. She remains on Synthroid, 112 mcg, one tablet 4 days per week and 1.5 tablets three days per week.   E. Her night sweats have recurred. She may sweat once or more per week.  Her right-sided hand numbness comes and goes.   F. The patient has converted to generic Cymbalta, but her co-pay stayed at $45.00 per month.   G. She still has three wens on her left thumb tendons. She had them aspirated, but they recurred, but are  less prominent and are no longer painful. She actually has a new wen on her left thumb tendon.    H. She is intermittently, chronically hoarse. The hoarseness comes and goes. She has a lot of postnasal drip and some cough. She is not sure if her allergies affect the hoarseness or not.   I. Her depression has improved, partly due to resuming Cymbalta, partly due to her Meniere's disease and vertigo improving,  and partly due to finding new things to do since she retired.  4. Pertinent Review of Systems:  Constitutional:  "Pretty good, I guess.". Her stamina  and energy are better.   Eyes: Vision is good long as she wears her glasses. She had an eye exam 6 months ago. She has the beginnings of a new sty on her left upper lid. There are no significant eye complaints.  Neck: The thyroid gland has not seemed to swell lately. The patient has no complaints of anterior neck soreness, tenderness,  pressure, discomfort, or difficulty swallowing.  Heart: Heart rate increases with exercise or other physical activity. The patient has no complaints of palpitations, irregular heat beats, chest pain, or chest pressure. Gastrointestinal: Her diarrhea, acid reflux, and dyspepsia are controlled by her omeprazole. She still eats some [place] food, but not as frequently.    Legs: Muscle mass and strength seem normal. Legs are stronger since she has been walking more. The episodic leg numbness and burning, have improved in terms of severity. She also notes that her shins are often tender. Feet: Her episodic foot numbness occurs very infrequently now. Virtually all of her sensory symptoms have improved since resuming Cymbalta.  GYN: Patient remains on Premarin, 0.625 mg per day. Emotional/psychiatric: She feels much better since resuming Cymbalta and moving on with her life post-retirement. . Mental: She does well at thinking, paying attention, remembering, and making decisions.    PAST MEDICAL, FAMILY, AND SOCIAL HISTORY:  Past Medical History  Diagnosis Date  . Hypothyroidism, acquired, autoimmune   . Fibromyalgia syndrome   . Thyroiditis, autoimmune   . Multinodular goiter (nontoxic)   . Tachycardia   . Familial tremor   . Coronary artery spasm   . Dyspepsia   . Fatigue   . Combined hyperlipidemia   . Dupuytren's contracture     Family History  Problem Relation Age of Onset  . Cancer Mother   . Thyroid disease Neg Hx   . Diabetes Neg Hx     Current outpatient prescriptions:DULoxetine (CYMBALTA) 20 MG capsule, Take 60 mg by mouth daily. , Disp: , Rfl: ;   enalapril (VASOTEC) 2.5 MG tablet, Take 2.5 mg by mouth daily.  , Disp: , Rfl: ;  estrogens, conjugated, (PREMARIN) 0.625 MG tablet, Take 0.625 mg by mouth daily. Take daily for 21 days then do not take for 7 days. , Disp: , Rfl: ;  levothyroxine (SYNTHROID, LEVOTHROID) 112 MCG tablet, Take 112 mcg by mouth daily., Disp: , Rfl:  Multiple Vitamin (MULTIVITAMIN) tablet, Take 1 tablet by mouth daily.  , Disp: , Rfl: ;  SYNTHROID 88 MCG tablet, TAKE 1 TABLET EVERY DAY, Disp: 30 tablet, Rfl: 6  Allergies as of 04/06/2012 - Review Complete 04/06/2012  Allergen Reaction Noted  . Amoxicillin  06/18/2010  . Lorabid (loracarbef)  06/18/2010  . Sulfa antibiotics  06/18/2010    1. Work and Family: She retired in June 2013. She stays fairly busy.  2. Activities: Physical activities are restricted due to the pain and fatigue. She does try  to walk daily.  3. Smoking, alcohol, or drugs: None 4. Primary Care Provider: Dr. Merri Brunette, [facility]  REVIEW OF SYSTEMS: There are no other significant problems involving Hollis's other body systems.   Objective:  Vital Signs:  BP 125/74  Pulse 88  Wt 157 lb 11.2 oz (71.532 kg) She has gained 2 lbs since last visit.   PHYSICAL EXAMINATION  Constitutional: The patient looks pretty good overall. She is alert, bright,  upbeat, perky, feisty, and funny.   Eyes: She has a small upper lid cyst forming. The area does not appear to be infected. There is no obvious arcus or proptosis. Moisture appears normal. Mouth: The oropharynx and tongue appear normal. Oral moisture is normal. Neck: The neck appears to be visibly normal. No carotid bruits are noted. The thyroid gland is about 20+ grams in size. The right lobe and isthmus are normal in size. The left lobe is only slightly enlarged. The consistency of the thyroid gland is normal. The thyroid gland is not tender to palpation. Her trapezius girdle is somewhat tight and tender. Her cervical range of  motion is somewhat restricted, but better. Lungs: The lungs are clear to auscultation. Air movement is good. Heart: Heart rate and rhythm are regular. Heart sounds S1 and S2 are normal. I did not appreciate any pathologic cardiac murmurs. Abdomen: The abdomen is somewhat enlarged. Bowel sounds are normal. There is no obvious hepatomegaly, splenomegaly, or other mass effect.  Arms: Muscle size and bulk are normal for age. Hands: There is no obvious tremor. Phalangeal and metacarpophalangeal joints are normal. She has multiple cystic nodules of the palms and left thumb. The cystic nodules of the left thumb are much smaller and are not painful today. Palmar muscles are normal. She has trace-1+ palmar erythema. Palmar moisture is also normal. Legs: Muscles appear normal for age. No edema is present. She has pain to palpation in the distal shins. Neurologic: Strength is normal for age in both the upper and lower extremities. Muscle tone is normal. Sensation to touch is normal in the legs.   LAB DATA:  04/02/12: TSH 1.073, free T4 1.31, free T3 2.9, calcium 9.5, 25-hydroxy vitamin D 31 - PTH was ordered but not done. I have re-ordered it this morning. 09/30/11: TSH 8.095, free T4 1.16, free T3 2.4. PTH 24, calcium 9.3, 25-hydroxy vitamin D 33, 1,25-dihydroxy vitamin D 95 11/21/10: Calcium was 9.5. Her PTH was 37.6. 25-hydroxy vitamin D was 41. 1, 25-dihydroxy vitamin D was 85, which was slightly elevated. B12 was 352 ([phone])     Assessment and Plan:   ASSESSMENT:  1. Hypothyroid: She was euthyroid in May 2012 and again this past week. In between, however, she had bouncing of her TFTs c/w flare ups of Hashimoto's disease. We will continue to follow her TFTs serially and adjust Synthroid doses as needed.  2. Thyroiditis: The patient's Hashimoto's disease has been clinically quiescent. I still suspect she will lose more thyroid cells in the next 2-3 years. 3. Headaches: Headaches appeared to be "tension"  headaches. They have improved with cervical stretching exercises. The headaches that present upon awakening are different and are probably due to poor neck alignment during sleep. I recommended that she try a "gull wing" pillow. 4. Goiter: Thyroid gland has decreased slightly in size since the last visit. The waxing and waning of thyroid gland size is c/w evolving Hashimoto's disease. 5. Night sweats: The night sweats have recurred. I wonder if this is  partly due to using a lot of blankets during the cold winter months, especially since she turns down the heat at night in order to save money. Her BP and pulse have remained normal. There is no indication of a pheochromocytoma.  .  6. Leg pains: Calcium is normal. I had to re-order her PTH today. Vitamin D level is normal, but low normal.   7. Meniere's disease: Her vertigo has essentially resolved. The left ear Meniere's disease is still a major issue for her. She may require surgery. Her hearing in the left ear varies. Her sensation of head fullness persists. It is likely that her environmental allergies are responsible for some serous fluid buildup in the ear.  8. Hoarseness: The fact that her hoarseness and post nasal drip come and go essentially in parallel indicate that the hoarseness is likely due to flare ups of her allergies.  9. Dyspepsia: controled with omeprazole.  PLAN:  1. Diagnostic: Will repeat TFTs and calcium/ PTH, and 25-hydroxy vitamin D levels 2 weeks before the next visit. I called Hospital doctor. The PTH must be re-drawn. I re-ordered the PTH and hand-carried her down ot the lab so that the PTH would be drawn properly. 2. Therapeutic: Do her best to continue daily physical activity. Continue current dose of Synthroid and MVI and calcium. .  3. Patient education: We discussed the pathophysiologies of headaches, Hashimoto's disease, and Meniere's disease.  4. Follow-up: 6 months  Level of Service: This visit lasted in  excess of 40 minutes. More than 50% of the visit was devoted to counseling.  David Stall

## 2012-04-07 LAB — PTH, INTACT AND CALCIUM: Calcium, Total (PTH): 9.7 mg/dL (ref 8.4–10.5)

## 2012-07-26 ENCOUNTER — Other Ambulatory Visit: Payer: Self-pay

## 2012-09-10 ENCOUNTER — Other Ambulatory Visit: Payer: Self-pay | Admitting: "Endocrinology

## 2012-09-10 ENCOUNTER — Other Ambulatory Visit: Payer: Self-pay | Admitting: *Deleted

## 2012-09-10 DIAGNOSIS — E038 Other specified hypothyroidism: Secondary | ICD-10-CM

## 2012-10-04 ENCOUNTER — Ambulatory Visit: Payer: Medicare Other | Admitting: "Endocrinology

## 2012-10-09 LAB — VITAMIN D 25 HYDROXY (VIT D DEFICIENCY, FRACTURES): Vit D, 25-Hydroxy: 37 ng/mL (ref 30–89)

## 2012-10-11 LAB — PTH, INTACT AND CALCIUM: PTH: 39.4 pg/mL (ref 14.0–72.0)

## 2012-10-12 ENCOUNTER — Ambulatory Visit (INDEPENDENT_AMBULATORY_CARE_PROVIDER_SITE_OTHER): Payer: Medicare Other | Admitting: "Endocrinology

## 2012-10-12 ENCOUNTER — Encounter: Payer: Self-pay | Admitting: "Endocrinology

## 2012-10-12 ENCOUNTER — Ambulatory Visit
Admission: RE | Admit: 2012-10-12 | Discharge: 2012-10-12 | Disposition: A | Discharge status: Home / Self Care | Payer: Medicare Other | Source: Ambulatory Visit | Attending: "Endocrinology | Admitting: "Endocrinology

## 2012-10-12 VITALS — BP 115/79 | HR 95 | Wt 159.3 lb

## 2012-10-12 DIAGNOSIS — R0789 Other chest pain: Secondary | ICD-10-CM

## 2012-10-12 DIAGNOSIS — R1013 Epigastric pain: Secondary | ICD-10-CM

## 2012-10-12 DIAGNOSIS — M79609 Pain in unspecified limb: Secondary | ICD-10-CM

## 2012-10-12 DIAGNOSIS — E038 Other specified hypothyroidism: Secondary | ICD-10-CM

## 2012-10-12 DIAGNOSIS — H8109 Meniere's disease, unspecified ear: Secondary | ICD-10-CM

## 2012-10-12 DIAGNOSIS — H8102 Meniere's disease, left ear: Secondary | ICD-10-CM

## 2012-10-12 DIAGNOSIS — E063 Autoimmune thyroiditis: Secondary | ICD-10-CM

## 2012-10-12 DIAGNOSIS — E049 Nontoxic goiter, unspecified: Secondary | ICD-10-CM

## 2012-10-12 DIAGNOSIS — M79606 Pain in leg, unspecified: Secondary | ICD-10-CM

## 2012-10-12 DIAGNOSIS — K3189 Other diseases of stomach and duodenum: Secondary | ICD-10-CM

## 2012-10-12 DIAGNOSIS — G44229 Chronic tension-type headache, not intractable: Secondary | ICD-10-CM

## 2012-10-12 NOTE — Progress Notes (Signed)
Subjective:  Patient Name: Shelby Lin Date of Birth: 25-Mar-1940  MRN: [id number]  Shelby Lin  presents to the office today for follow-up of her hypothyroidism secondary to Hashimoto's disease, questionable multinodular goiter, tachycardia, tremor, dyspepsia, fatigue, hyperlipidemia, night sweats, and tremor.  HISTORY OF PRESENT ILLNESS:   Shelby Lin is a 72 y.o. Caucasian woman.  [name] was unaccompanied.  1. Shelby Lin was referred to me on 03/25/2005 by her primary care provider, Dr. Merri Brunette, for evaluation of her thyroid problems. Shelby Lin was 33 at that time.   A. At about age 68 the patient developed thyroid problems and was put on a thyroid medicine. Later when she became pregnant her obstetrician took her off that medicine. At about age 3, she developed severe fatigue and weight gain. She was put back on Synthroid. Her dose of Synthroid was 88 mcg per day for many years. In January 2006, she had fluctuations in her thyroid function tests. Synthroid was increased to 100 mcg and later to 125 mcg per day. The Synthroid dose was then reduced to 75 mcg. After blood tests about 9 months prior to her first appointment with me, the patient was put back on a dose of 88 mcg per day. When the patient was having fluctuations in her thyroid tests, her thyroid gland became visibly enlarged and felt full and tender to palpation. She was also more hoarse during that period. Hoarseness comes and goes in parallel with the thyroid gland swelling. Ultrasound studies of the thyroid in January 2006 and January 2007 showed multinodular goiter.   B. Thyroid function tests performed at that first visit showed a TSH of 4.139, free T4 1.22, and free T3 of 2.7. Because any TSH at that time greater than 3 was really high, I increased her Synthroid from 88 to 100 mcg per day. TPO antibody was markedly positive at 954.0, c/w the diagnosis of Hashimoto's thyroiditis.Marland Kitchen TSI was quite normal at 0.9.   2. During the last 7  years the patient has had several flare-ups of Hashimoto's disease. In February 2011 we had to increase her Synthroid dose to 125 mcg per day. Later that year we reduced the dose to 125 mcg 5 days per week and later to 88 mcg/day 6 days per week.    3. The patient's last [facility] visit was on 04/06/12. In the interim, she has been better, both physically and psychologically.   A. Her major problem at this visit is that her feet feel like they are "burning like hot coals". Her podiatrist has been giving her cortisone injections in her feet. The podiatrist wants to do surgery on her right bunion and right hammer toe. [name] does not want to have surgery now.   B. Her Meniere's disease of the left ear is not any better. Her ENT doctor had advised doing surgery on her left mastoid area, but she has declined. The hearing in the left ear varies. Her sensation of head fullness continues, especially on the left side. She still has occasional vertigo, but has not had to take meclizine recently. She is on a low salt diet to reduce edema within the left ear.   C. Her COPD is better. She does her breathing exercises every day.   D. She continues to have fronto-temporal headaches, occasionally involving the nuchal area and trapezius areas. Her trapezius areas remain tight and tender, but are better. The cervical stretching exercises help a lot when she does them.   E. She remains on Synthroid,  112 mcg, one tablet 4 days per week and 1.5 tablets three days per week. She also takes omeprazole and enalapril.   E. Her night sweats have recurred. She may sweat once or more per week.  Her right-sided hand numbness comes and goes.   F. The patient has converted to generic Cymbalta, but her co-pay stayed at $45.00 per month.   G. She still has three wens on her left thumb tendons. She had them aspirated, but they recurred. fortunately, they are less prominent and are no longer painful.   H. She is intermittently, chronically  hoarse. The hoarseness comes and goes. She has a lot of postnasal drip and some cough. She is not sure if her allergies affect the hoarseness or not.   I. Her depression is "OK - good" and has improved partly due to resuming Cymbalta, partly due to her Meniere's disease and vertigo improving,  and partly due to finding new things to do since she retired.   4. Pertinent Review of Systems:  Constitutional:  "Pretty good, I guess.". Her stamina and energy are not better.   Eyes: Vision is good long as she wears her glasses. She had an eye exam 6 months ago. There are no significant eye complaints.  Neck: The thyroid gland has not seemed to swell lately. The patient has no complaints of anterior neck soreness, tenderness,  pressure, discomfort, or difficulty swallowing.  Heart: She occasionally has the sensation of "grabbing" in her mid-chest, then radiating upward to her jaws. Heart rate increases with exercise or other physical activity. The patient has no other complaints of palpitations, irregular heat beats, chest pain, or chest pressure. Gastrointestinal: Her acid reflux and dyspepsia are pretty well controlled by her omeprazole. She still eats some [place] food, but not as frequently.    Legs: Muscle mass and strength seem normal. Legs were stronger when she was walking more. The episodic leg numbness and burning, have resolved. She has had some leg edema at times.  Feet: Her episodic foot pains are much worse, as noted above.   GYN: Patient remains on Premarin, 0.625 mg per day. Emotional/psychiatric: She feels much better since resuming Cymbalta and moving on with her life post-retirement. . Mental: She does well at thinking, paying attention, remembering, and making decisions.    PAST MEDICAL, FAMILY, AND SOCIAL HISTORY:  Past Medical History  Diagnosis Date  . Hypothyroidism, acquired, autoimmune   . Fibromyalgia syndrome   . Thyroiditis, autoimmune   . Multinodular goiter (nontoxic)    . Tachycardia   . Familial tremor   . Coronary artery spasm   . Dyspepsia   . Fatigue   . Combined hyperlipidemia   . Dupuytren's contracture     Family History  Problem Relation Age of Onset  . Cancer Mother   . Thyroid disease Neg Hx   . Diabetes Neg Hx     Current outpatient prescriptions:DULoxetine (CYMBALTA) 20 MG capsule, Take 60 mg by mouth daily. , Disp: , Rfl: ;  enalapril (VASOTEC) 2.5 MG tablet, Take 2.5 mg by mouth daily.  , Disp: , Rfl: ;  estrogens, conjugated, (PREMARIN) 0.625 MG tablet, Take 0.625 mg by mouth daily. Take daily for 21 days then do not take for 7 days. , Disp: , Rfl: ;  Multiple Vitamin (MULTIVITAMIN) tablet, Take 1 tablet by mouth daily.  , Disp: , Rfl:  SYNTHROID 112 MCG tablet, TAKE 1 TABLET EVERY DAY, Disp: 30 tablet, Rfl: 0;  SYNTHROID 88 MCG tablet, TAKE  1 TABLET EVERY DAY, Disp: 30 tablet, Rfl: 6  Allergies as of 10/12/2012 - Review Complete 10/12/2012  Allergen Reaction Noted  . Amoxicillin  06/18/2010  . Lorabid [loracarbef]  06/18/2010  . Sulfa antibiotics  06/18/2010    1. Work and Family: She retired in June 2013. She stays fairly busy.  2. Activities: Physical activities are restricted due to foot pain and fatigue. She does try to walk daily.  3. Smoking, alcohol, or drugs: None 4. Primary Care Provider: Dr. Merri Brunette, [facility]  REVIEW OF SYSTEMS: There are no other significant problems involving Jilliann's other body systems.   Objective:  Vital Signs:  BP 115/79  Pulse 95  Wt 159 lb 4.8 oz (72.258 kg) She has gained 2 lbs since last visit.   PHYSICAL EXAMINATION  Constitutional: The patient looks pretty good overall. She is alert, bright,  upbeat, perky, and feisty.   Eyes: There is no obvious arcus or proptosis. Moisture appears normal. Mouth: The oropharynx and tongue appear normal. Oral moisture is normal. Neck: The neck appears to be visibly normal. No carotid bruits are noted. The thyroid gland  is about 20+ grams in size. The right lobe and isthmus are normal in size. The left lobe is only slightly enlarged. The consistency of the thyroid gland is normal. The thyroid gland is not tender to palpation. Her trapezius girdle is still tight and tender. Her cervical range of motion is somewhat restricted, but better. Lungs: The lungs are clear to auscultation. Air movement is good. Heart: Heart rate and rhythm are regular. Heart sounds S1 and S2 are normal. I did not appreciate any pathologic cardiac murmurs. Abdomen: The abdomen is somewhat enlarged. Bowel sounds are normal. There is no obvious hepatomegaly, splenomegaly, or other mass effect.  Arms: Muscle size and bulk are normal for age. Hands: There is a trace-to-1+ tremor. Phalangeal and metacarpophalangeal joints are normal. Palmar muscles are normal. Palmar moisture is also normal. Legs: Muscles appear normal for age. No edema is present. She has pain to palpation in the distal shins. Neurologic: Strength is normal for age in both the upper and lower extremities. Muscle tone is normal. Sensation to touch is normal in the legs.   LAB DATA:  10/08/12: TH 2.106, free T4 1.05, free T3 2.6; 25-OH vitamin D 37, PTH 39.4, calcium 9.1 04/02/12: TSH 1.073, free T4 1.31, free T3 2.9, calcium 9.5, 25-hydroxy vitamin D 31 - PTH was ordered but not done. I have re-ordered it this morning. 09/30/11: TSH 8.095, free T4 1.16, free T3 2.4. PTH 24, calcium 9.3, 25-hydroxy vitamin D 33, 1,25-dihydroxy vitamin D 95 11/21/10: Calcium was 9.5. Her PTH was 37.6. 25-hydroxy vitamin D was 41. 1, 25-dihydroxy vitamin D was 85, which was slightly elevated. B12 was 352 (211-911)     Assessment and Plan:   ASSESSMENT:  1. Hypothyroid: She was euthyroid this past February and again this month. In between, however, she had bouncing of her TFTs c/w flare ups of Hashimoto's disease. We will continue to follow her TFTs serially and adjust Synthroid doses as needed.  2.  Thyroiditis: The patient's Hashimoto's disease has been clinically quiescent. I still suspect she will lose more thyroid cells in the next 2-3 years. 3. Headaches: Headaches appear to be "tension" headaches. They have improved with cervical stretching exercises. The headaches that present upon awakening are different and are probably due to poor neck alignment during sleep. Her cervical stretching exercises and "gull wing" pillow help  her.  4. Multinodular goiter: Thyroid gland is about the same size as at last visit. The waxing and waning of thyroid gland size is c/w evolving Hashimoto's disease. Although some [place] studies have shown a nodule, her last [place] study in June 2011 showed either a questionable nodule or demonstrated the echotexture heterogeneity that is commonly seen with Hashimoto's disease. 5. Night sweats: The night sweats occur 3-4 nights per week. Her BP and pulse have remained normal. There is no indication of a pheochromocytoma. .  6. Leg pains: Calcium is normal, but in the lower half of the normal range. Her vitamin D and PTH are also normal. I wonder if increasing her Caltrate-D from once per day to twice per day might help.   7. Meniere's disease: Her vertigo has essentially resolved. The left ear Meniere's disease is still an issue for her. She may require surgery. Her hearing in the left ear varies. Her sensation of head fullness persists. It is likely that her environmental allergies are responsible for some serous fluid buildup in the ear.  8. Hoarseness: The fact that her hoarseness and post nasal drip come and go essentially in parallel indicate that the hoarseness is likely due to flare ups of her allergies.  9. Dyspepsia: This problem is controlled with omeprazole. 10. Chest pressure with radiation to her jaws: While these symptoms may well  be due to esophageal spasm, they could also be due to ASHD. Women are much more likely to have vague symptoms than men when they have ASHD.  I've asked her to contact Dr. Katrinka Blazing for a full evaluation.   PLAN:  1. Diagnostic: Repeat US of thyroid. Will repeat TFTs and calcium/ PTH, and 25-hydroxy vitamin D levels 2 weeks before the next visit. 2. Therapeutic: Do her best to continue daily physical activity. Continue current dose of Synthroid and MVI. Increase Caltrate-D to twice a day, one at lunch and one at supper.  3. Patient education: We discussed the pathophysiologies of headaches, Hashimoto's disease, and Meniere's disease.  4. Follow-up: 6 months  Level of Service: This visit lasted in excess of 40 minutes. More than 50% of the visit was devoted to counseling.  David Stall

## 2012-10-12 NOTE — Patient Instructions (Signed)
Follow up visit in 6 months. Please have lab tests done one week prior to the next visit. Please contact Dr. Katrinka Blazing re chest tightness.

## 2012-10-18 ENCOUNTER — Encounter: Payer: Self-pay | Admitting: *Deleted

## 2012-10-20 ENCOUNTER — Telehealth: Payer: Self-pay | Admitting: "Endocrinology

## 2012-10-20 NOTE — Telephone Encounter (Signed)
PT IS CALLING IN FOR ULTRA SOUND RESULTS

## 2012-10-20 NOTE — Telephone Encounter (Signed)
I called the patient to give her the thyroid US results. She was not available. I left a VM message that I have her results. I asked her to contact me tomorrow. David Stall

## 2012-10-22 ENCOUNTER — Other Ambulatory Visit: Payer: Self-pay | Admitting: "Endocrinology

## 2012-10-27 ENCOUNTER — Other Ambulatory Visit: Payer: Self-pay

## 2012-10-27 DIAGNOSIS — Z9889 Other specified postprocedural states: Secondary | ICD-10-CM

## 2012-10-27 DIAGNOSIS — Z1231 Encounter for screening mammogram for malignant neoplasm of breast: Secondary | ICD-10-CM

## 2012-11-05 ENCOUNTER — Ambulatory Visit: Payer: Medicare Other

## 2012-11-26 ENCOUNTER — Ambulatory Visit
Admission: RE | Admit: 2012-11-26 | Discharge: 2012-11-26 | Disposition: A | Discharge status: Home / Self Care | Payer: Medicare Other | Source: Ambulatory Visit

## 2012-11-26 DIAGNOSIS — Z1231 Encounter for screening mammogram for malignant neoplasm of breast: Secondary | ICD-10-CM

## 2012-11-26 DIAGNOSIS — Z9889 Other specified postprocedural states: Secondary | ICD-10-CM

## 2012-11-30 ENCOUNTER — Other Ambulatory Visit: Payer: Self-pay | Admitting: *Deleted

## 2012-11-30 ENCOUNTER — Other Ambulatory Visit: Payer: Self-pay | Admitting: Family Medicine

## 2012-11-30 ENCOUNTER — Other Ambulatory Visit: Payer: Self-pay | Admitting: "Endocrinology

## 2012-11-30 DIAGNOSIS — R928 Other abnormal and inconclusive findings on diagnostic imaging of breast: Secondary | ICD-10-CM

## 2012-11-30 DIAGNOSIS — E038 Other specified hypothyroidism: Secondary | ICD-10-CM

## 2012-12-15 ENCOUNTER — Ambulatory Visit
Admission: RE | Admit: 2012-12-15 | Discharge: 2012-12-15 | Disposition: A | Discharge status: Home / Self Care | Payer: Medicare Other | Source: Ambulatory Visit | Attending: Family Medicine | Admitting: Family Medicine

## 2012-12-15 ENCOUNTER — Other Ambulatory Visit: Payer: Self-pay | Admitting: Family Medicine

## 2012-12-15 DIAGNOSIS — R921 Mammographic calcification found on diagnostic imaging of breast: Secondary | ICD-10-CM

## 2012-12-15 DIAGNOSIS — R928 Other abnormal and inconclusive findings on diagnostic imaging of breast: Secondary | ICD-10-CM

## 2012-12-21 ENCOUNTER — Ambulatory Visit
Admission: RE | Admit: 2012-12-21 | Discharge: 2012-12-21 | Disposition: A | Discharge status: Home / Self Care | Payer: Medicare Other | Source: Ambulatory Visit | Attending: Family Medicine | Admitting: Family Medicine

## 2012-12-21 DIAGNOSIS — R921 Mammographic calcification found on diagnostic imaging of breast: Secondary | ICD-10-CM

## 2013-01-06 ENCOUNTER — Ambulatory Visit (INDEPENDENT_AMBULATORY_CARE_PROVIDER_SITE_OTHER): Payer: Medicare Other | Admitting: General Surgery

## 2013-01-06 ENCOUNTER — Encounter (INDEPENDENT_AMBULATORY_CARE_PROVIDER_SITE_OTHER): Payer: Self-pay | Admitting: General Surgery

## 2013-01-06 ENCOUNTER — Encounter (INDEPENDENT_AMBULATORY_CARE_PROVIDER_SITE_OTHER): Payer: Self-pay

## 2013-01-06 VITALS — BP 127/72 | HR 68 | Temp 97.7°F | Resp 14 | Ht 65.0 in | Wt 162.0 lb

## 2013-01-06 DIAGNOSIS — N6099 Unspecified benign mammary dysplasia of unspecified breast: Secondary | ICD-10-CM

## 2013-01-06 DIAGNOSIS — N6089 Other benign mammary dysplasias of unspecified breast: Secondary | ICD-10-CM

## 2013-01-06 NOTE — Progress Notes (Signed)
Subjective:   diagnosis atypical ductal hyperplasia of the right breast  [name], female   DOB: 07-08-1940, 72 y.o.   MRN: [id number]  HPI Patient is a very pleasant 72 year old female referred by Dr. Judyann Munson following a recent needle core biopsy of the right breast showing atypical ductal hyperplasia. The patient has a history of fibrocystic breast disease but no personal history of breast cancer. She recently presented for a screening mammogram. This revealed a new small area of clustered calcifications within the right breast. She was recalled for diagnostic mammogram which confirmed a 3 x 3 x 3 mm group of heterogeneous calcifications in the lower inner quadrant of the right breast. Stereotactic biopsy was recommended and performed. This has revealed atypical ductal hyperplasia associated with microcalcifications. Complete excision of the area was recommended and she was referred. She has not noticed any breast lumps or nipple discharge or skin changes. Her right nipple has been somewhat sensitive for about a month.  Past Medical History  Diagnosis Date  . Hypothyroidism, acquired, autoimmune   . Fibromyalgia syndrome   . Thyroiditis, autoimmune   . Multinodular goiter (nontoxic)   . Tachycardia   . Familial tremor   . Coronary artery spasm   . Dyspepsia   . Fatigue   . Combined hyperlipidemia   . Dupuytren's contracture   . COPD (chronic obstructive pulmonary disease)   . GERD (gastroesophageal reflux disease)   . Hypertension    Past Surgical History  Procedure Laterality Date  . Partial hysterectomy    . Breast lumpectomy    . Cholecystectomy     Current Outpatient Prescriptions  Medication Sig Dispense Refill  . DULoxetine (CYMBALTA) 20 MG capsule Take 60 mg by mouth daily.       . enalapril (VASOTEC) 2.5 MG tablet Take 2.5 mg by mouth daily.        Marland Kitchen estrogens, conjugated, (PREMARIN) 0.625 MG tablet Take 0.625 mg by mouth daily. Take daily for 21 days then do  not take for 7 days.       Marland Kitchen levothyroxine (SYNTHROID) 112 MCG tablet Take 1 tablet Synthroid 112 mcg four days a week and 1.5 tablets 3 days a week.  40 tablet  4  . Multiple Vitamin (MULTIVITAMIN) tablet Take 1 tablet by mouth daily.         No current facility-administered medications for this visit.   Allergies  Allergen Reactions  . Amoxicillin   . Lorabid [Loracarbef]   . Sulfa Antibiotics      Review of Systems  Respiratory: Negative.   Cardiovascular: Positive for chest pain. Negative for palpitations and leg swelling.  Gastrointestinal: Positive for diarrhea and constipation. Negative for abdominal pain.       Objective:   Physical Exam BP 127/72  Pulse 68  Temp(Src) 97.7 F (36.5 C) (Oral)  Resp 14  Ht 5\' 5"  (1.651 m)  Wt 162 lb (73.483 kg)  BMI 26.96 kg/m2 General: Alert, well-developed Caucasian female, in no distress Skin: Warm and dry without rash or infection. HEENT: No palpable masses or thyromegaly. Sclera nonicteric. Pupils equal round and reactive. Oropharynx clear. Lymph nodes: No cervical, supraclavicular, or inguinal nodes palpable. Breasts: Healing core biopsy site inferior medial right breast. I cannot feel any masses in either breast. No palpable axillary adenopathy. Lungs: Breath sounds clear and equal without increased work of breathing Cardiovascular: Regular rate and rhythm without murmur. No JVD or edema. Peripheral pulses intact. Abdomen: Nondistended. Soft and nontender. No masses  palpable. No organomegaly. No palpable hernias. Extremities: No edema or joint swelling or deformity. No chronic venous stasis changes. Neurologic: Alert and fully oriented. Gait normal.    Assessment:     Recent mammogram showing a small cluster of heterogeneous calcifications in core biopsy has revealed atypical ductal hyperplasia. I have recommended needle localized excision. We discussed the indications for the procedure and its nature as well as risks of  bleeding infection and anesthetic complications. She has COPD that is very stable. She has a diagnosis of coronary spasm and we will obtain cardiac clearance for general anesthesia    Plan:     Needle localized right breast lumpectomy as an outpatient under general anesthesia after obtaining cardiac clearance.

## 2013-01-10 ENCOUNTER — Other Ambulatory Visit (INDEPENDENT_AMBULATORY_CARE_PROVIDER_SITE_OTHER): Payer: Self-pay

## 2013-01-10 DIAGNOSIS — N6099 Unspecified benign mammary dysplasia of unspecified breast: Secondary | ICD-10-CM

## 2013-01-13 ENCOUNTER — Telehealth: Payer: Self-pay | Admitting: Cardiology

## 2013-01-13 NOTE — Telephone Encounter (Signed)
New message     Need clearance for breast surgery by dr Johna Sheriff.  They faxed a form here 1 week ago and have not heard back from [place]

## 2013-01-14 ENCOUNTER — Encounter: Payer: Self-pay | Admitting: Cardiology

## 2013-01-17 ENCOUNTER — Encounter: Payer: Self-pay | Admitting: Cardiology

## 2013-01-17 ENCOUNTER — Ambulatory Visit (INDEPENDENT_AMBULATORY_CARE_PROVIDER_SITE_OTHER): Payer: Medicare Other | Admitting: Cardiology

## 2013-01-17 VITALS — BP 110/80 | HR 80 | Ht 65.0 in | Wt 158.0 lb

## 2013-01-17 DIAGNOSIS — J4489 Other specified chronic obstructive pulmonary disease: Secondary | ICD-10-CM

## 2013-01-17 DIAGNOSIS — I208 Other forms of angina pectoris: Secondary | ICD-10-CM

## 2013-01-17 DIAGNOSIS — I2089 Other forms of angina pectoris: Secondary | ICD-10-CM

## 2013-01-17 DIAGNOSIS — J449 Chronic obstructive pulmonary disease, unspecified: Secondary | ICD-10-CM

## 2013-01-17 DIAGNOSIS — Z01812 Encounter for preprocedural laboratory examination: Secondary | ICD-10-CM

## 2013-01-17 DIAGNOSIS — Z01810 Encounter for preprocedural cardiovascular examination: Secondary | ICD-10-CM

## 2013-01-17 LAB — CBC WITH DIFFERENTIAL/PLATELET
Basophils Absolute: 0.1 10*3/uL (ref 0.0–0.1)
Eosinophils Relative: 1.8 % (ref 0.0–5.0)
HCT: 36.2 % (ref 36.0–46.0)
Hemoglobin: 12.1 g/dL (ref 12.0–15.0)
Lymphs Abs: 3.4 10*3/uL (ref 0.7–4.0)
MCHC: 33.4 g/dL (ref 30.0–36.0)
Monocytes Relative: 6.4 % (ref 3.0–12.0)
Platelets: 361 10*3/uL (ref 150.0–400.0)
RBC: 4.02 Mil/uL (ref 3.87–5.11)
WBC: 9.4 10*3/uL (ref 4.5–10.5)

## 2013-01-17 LAB — PROTIME-INR: Prothrombin Time: 10.6 s (ref 10.2–12.4)

## 2013-01-17 LAB — BASIC METABOLIC PANEL
CO2: 28 mEq/L (ref 19–32)
Chloride: 99 mEq/L (ref 96–112)
GFR: 120.32 mL/min (ref >60.00)
Potassium: 3.7 mEq/L (ref 3.5–5.1)
Sodium: 137 mEq/L (ref 135–145)

## 2013-01-17 NOTE — Patient Instructions (Signed)
Your physician recommends that you have labs today: BMET, CBC, PTINR  Your physician has requested that you have a  Left cardiac catheterization. Cardiac catheterization is used to diagnose and/or treat various heart conditions. Doctors may recommend this procedure for a number of different reasons. The most common reason is to evaluate chest pain. Chest pain can be a symptom of coronary artery disease (CAD), and cardiac catheterization can show whether plaque is narrowing or blocking your heart's arteries. This procedure is also used to evaluate the valves, as well as measure the blood flow and oxygen levels in different parts of your heart. For further information please visit [email or web address] Please follow instruction sheet, as given. On 01/26/2013 @ 8:30 am.  Your physician recommends that you continue on your current medications as directed. Please refer to the Current Medication list given to you today.  Your physician recommends that you schedule a follow-up appointment in: 1 weeks from scheduled Heart Cath. with Dr. Anne Fu

## 2013-01-17 NOTE — Progress Notes (Signed)
[address] [place], [place]  [address] Phone: [phone] Fax:  [phone]  Date:  01/17/2013   ID:  Shelby Lin, DOB [birth year], MRN [id number]  PCP:  Allean Found, MD   History of Present Illness: Shelby Lin is a 72 y.o. female with previous chest pain workup 9/14 and with intermediate risk exercise treadmill test here for followup. Dr. Johna Sheriff asked her to be seen because of breast cancer and need for surgery. She had 3 more spells of jaw pain lasting a few minutes relieved with hot Coke and TUMS and it passed. Her father had GERD as well and ended up having CAD and dying suddenly of a heart attack.  Previous cardiac catheterization approximately 7 years ago was "pristine". We had lengthy discussion previously about heart catheterization and I told her that I would have a low threshold to proceed.  Intermittent chest discomfort. History of hypertension, diet-controlled diabetes, fibromyalgia. Mostly neck bilateral discomfort. Squeeze. Intense radiates to jaw. 5 min. Quite severe. Not as much in chest. Can occur at rest.  Sister has CABG, CVA. Mother - angina, afib. Former smoker - quit 12 years ago. .     Wt Readings from Last 3 Encounters:  01/17/13 158 lb (71.668 kg)  01/06/13 162 lb (73.483 kg)  10/12/12 159 lb 4.8 oz (72.258 kg)     Past Medical History  Diagnosis Date  . Hypothyroidism, acquired, autoimmune   . Fibromyalgia syndrome   . Thyroiditis, autoimmune   . Multinodular goiter (nontoxic)   . Tachycardia   . Familial tremor   . Coronary artery spasm   . Dyspepsia   . Fatigue   . Combined hyperlipidemia   . Dupuytren's contracture   . COPD (chronic obstructive pulmonary disease)   . GERD (gastroesophageal reflux disease)   . Hypertension     Past Surgical History  Procedure Laterality Date  . Partial hysterectomy    . Breast lumpectomy    . Cholecystectomy      Current Outpatient Prescriptions  Medication Sig  Dispense Refill  . DULoxetine (CYMBALTA) 20 MG capsule Take 60 mg by mouth daily.       . enalapril (VASOTEC) 2.5 MG tablet Take 2.5 mg by mouth daily.        Marland Kitchen estrogens, conjugated, (PREMARIN) 0.625 MG tablet Take 0.625 mg by mouth daily. Take daily for 21 days then do not take for 7 days.       Marland Kitchen levothyroxine (SYNTHROID) 112 MCG tablet Take 1 tablet Synthroid 112 mcg four days a week and 1.5 tablets 3 days a week.  40 tablet  4  . Multiple Vitamin (MULTIVITAMIN) tablet Take 1 tablet by mouth daily.         No current facility-administered medications for this visit.    Allergies:    Allergies  Allergen Reactions  . Amoxicillin   . Lorabid [Loracarbef]   . Sulfa Antibiotics     Social History:  The patient  reports that she has quit smoking. She does not have any smokeless tobacco history on file. She reports that she does not drink alcohol or use illicit drugs.   ROS:  Please see the history of present illness.   No bleeding, no syncope, no orthopnea, no PND   PHYSICAL EXAM: VS:  BP 110/80  Pulse 80  Ht 5\' 5"  (1.651 m)  Wt 158 lb (71.668 kg)  BMI 26.29 kg/m2 Well nourished, well developed, in no acute  distress HEENT: normal Neck: no JVD Cardiac:  normal S1, S2; RRR; no murmur Lungs:  clear to auscultation bilaterally, no wheezing, rhonchi or rales Abd: soft, nontender, no hepatomegaly Ext: no edemaNormal radial pulse Skin: warm and dry Neuro: no focal abnormalities noted  EKG:  Sinus rhythm rate 80 with no other changes  Exercise treadmill test: 10/28/12: exercised for 3 minutes and 5 seconds. 5 METs of activity. Blood pressure response was normal. EKG at baseline showed nonspecific ST-T wave changes with subtle ST segment depression diffusely. This was accentuated slightly during recovery. She had shortness of breath. Has COPD.   ASSESSMENT AND PLAN:  1. Angina-for ongoing occasional bouts of jaw pain, chest pain, strong family history of CAD, intermediate risk exercise  treadmill test I would like to proceed with heart catheterization, via radial artery approach, to detect if there is any evidence of flow limiting coronary artery disease. Risks and benefits have been described to patient including stroke, heart attack, death. 2. Preoperative risk assessment-Dr. Johna Sheriff, breast cancer. As above. Further risk stratification after heart cath. 3. COPD-chronic. Former smoker.  Orders have been placed.   Cath Lab Visit (complete for each Cath Lab visit)  Clinical Evaluation Leading to the Procedure:   ACS: no  Non-ACS:    Anginal Classification: CCS II  Anti-ischemic medical therapy: No Therapy  Non-Invasive Test Results: Intermediate-risk stress test findings: cardiac mortality 1-3%/year  Prior CABG: No previous CABG         Signed, Donato Schultz, MD [facility]  01/17/2013 10:31 AM

## 2013-01-18 ENCOUNTER — Encounter (HOSPITAL_COMMUNITY): Payer: Self-pay | Admitting: Pharmacy Technician

## 2013-01-18 NOTE — Telephone Encounter (Signed)
Left message on voicemail advising patient that she needs no be seen in office to have a surgical clearance cleared.

## 2013-01-19 ENCOUNTER — Telehealth: Payer: Self-pay | Admitting: Cardiology

## 2013-01-19 ENCOUNTER — Encounter: Payer: Self-pay | Admitting: Cardiology

## 2013-01-19 NOTE — Telephone Encounter (Signed)
New Problem:  Pt states she is returning a call to [facility] from late yesterday evening. I informed the pt that [facility] is off for the rest of the week. Pt states she believes [facility] was calling in reference to her procedure. Pt is asking if a triage nurse can review her upcoming procedure and give her a call back.

## 2013-01-19 NOTE — Telephone Encounter (Signed)
Called stating she is returning [facility] call.  States she is unsure why he was calling.  States she has instructions for heart cath on 12/3 and she had lab done 11/24.  Advised that I will send to [facility] for him to evaluate

## 2013-01-25 MED ORDER — CIPROFLOXACIN IN D5W 400 MG/200ML IV SOLN
400.0000 mg | INTRAVENOUS | Status: DC
  Filled 2013-01-25: qty 200

## 2013-01-26 ENCOUNTER — Encounter (HOSPITAL_COMMUNITY): Payer: Self-pay

## 2013-01-26 ENCOUNTER — Ambulatory Visit (HOSPITAL_COMMUNITY)
Admission: RE | Admit: 2013-01-26 | Discharge: 2013-01-26 | Disposition: A | Discharge status: Home / Self Care | Payer: Medicare Other | Source: Ambulatory Visit | Attending: Cardiology | Admitting: Cardiology

## 2013-01-26 ENCOUNTER — Encounter (HOSPITAL_COMMUNITY)
Admission: RE | Disposition: A | Discharge status: Home / Self Care | Payer: Self-pay | Source: Ambulatory Visit | Attending: Cardiology

## 2013-01-26 DIAGNOSIS — R9439 Abnormal result of other cardiovascular function study: Secondary | ICD-10-CM

## 2013-01-26 DIAGNOSIS — J449 Chronic obstructive pulmonary disease, unspecified: Secondary | ICD-10-CM | POA: Insufficient documentation

## 2013-01-26 DIAGNOSIS — K219 Gastro-esophageal reflux disease without esophagitis: Secondary | ICD-10-CM | POA: Insufficient documentation

## 2013-01-26 DIAGNOSIS — E782 Mixed hyperlipidemia: Secondary | ICD-10-CM | POA: Insufficient documentation

## 2013-01-26 DIAGNOSIS — J4489 Other specified chronic obstructive pulmonary disease: Secondary | ICD-10-CM | POA: Insufficient documentation

## 2013-01-26 DIAGNOSIS — E119 Type 2 diabetes mellitus without complications: Secondary | ICD-10-CM | POA: Insufficient documentation

## 2013-01-26 DIAGNOSIS — R6884 Jaw pain: Secondary | ICD-10-CM | POA: Insufficient documentation

## 2013-01-26 DIAGNOSIS — Z8249 Family history of ischemic heart disease and other diseases of the circulatory system: Secondary | ICD-10-CM | POA: Insufficient documentation

## 2013-01-26 DIAGNOSIS — C50919 Malignant neoplasm of unspecified site of unspecified female breast: Secondary | ICD-10-CM | POA: Insufficient documentation

## 2013-01-26 DIAGNOSIS — I10 Essential (primary) hypertension: Secondary | ICD-10-CM | POA: Insufficient documentation

## 2013-01-26 DIAGNOSIS — IMO0001 Reserved for inherently not codable concepts without codable children: Secondary | ICD-10-CM | POA: Insufficient documentation

## 2013-01-26 DIAGNOSIS — Z87891 Personal history of nicotine dependence: Secondary | ICD-10-CM | POA: Insufficient documentation

## 2013-01-26 DIAGNOSIS — I208 Other forms of angina pectoris: Secondary | ICD-10-CM

## 2013-01-26 HISTORY — PX: LEFT HEART CATHETERIZATION WITH CORONARY ANGIOGRAM: SHX5451

## 2013-01-26 SURGERY — LEFT HEART CATHETERIZATION WITH CORONARY ANGIOGRAM
Anesthesia: LOCAL

## 2013-01-26 MED ORDER — ACETAMINOPHEN 325 MG PO TABS: 650.0000 mg | ORAL_TABLET | ORAL | Status: DC | PRN

## 2013-01-26 MED ORDER — SODIUM CHLORIDE 0.9 % IV SOLN: 250.0000 mL | INTRAVENOUS | Status: DC | PRN

## 2013-01-26 MED ORDER — NITROGLYCERIN 0.2 MG/ML ON CALL CATH LAB
INTRAVENOUS | Status: AC
Start: 2013-01-26 — End: 2013-01-26
  Filled 2013-01-26: qty 1

## 2013-01-26 MED ORDER — DIAZEPAM 5 MG PO TABS
ORAL_TABLET | ORAL | Status: AC
  Filled 2013-01-26: qty 1

## 2013-01-26 MED ORDER — DIAZEPAM 5 MG PO TABS
5.0000 mg | ORAL_TABLET | ORAL | Status: AC
  Administered 2013-01-26: 5 mg via ORAL

## 2013-01-26 MED ORDER — HEPARIN SODIUM (PORCINE) 1000 UNIT/ML IJ SOLN
INTRAMUSCULAR | Status: AC
  Filled 2013-01-26: qty 1

## 2013-01-26 MED ORDER — SODIUM CHLORIDE 0.9 % IV SOLN: 1.0000 mL/kg/h | INTRAVENOUS | Status: AC

## 2013-01-26 MED ORDER — HEPARIN (PORCINE) IN NACL 2-0.9 UNIT/ML-% IJ SOLN
INTRAMUSCULAR | Status: AC
  Filled 2013-01-26: qty 1500

## 2013-01-26 MED ORDER — ONDANSETRON HCL 4 MG/2ML IJ SOLN: 4.0000 mg | Freq: Four times a day (QID) | INTRAMUSCULAR | Status: DC | PRN

## 2013-01-26 MED ORDER — FENTANYL CITRATE 0.05 MG/ML IJ SOLN
INTRAMUSCULAR | Status: AC
  Filled 2013-01-26: qty 2

## 2013-01-26 MED ORDER — LIDOCAINE HCL (PF) 1 % IJ SOLN
INTRAMUSCULAR | Status: AC
  Filled 2013-01-26: qty 30

## 2013-01-26 MED ORDER — ASPIRIN 81 MG PO CHEW
CHEWABLE_TABLET | ORAL | Status: AC
  Filled 2013-01-26: qty 1

## 2013-01-26 MED ORDER — ASPIRIN 81 MG PO CHEW
81.0000 mg | CHEWABLE_TABLET | ORAL | Status: AC
  Administered 2013-01-26: 81 mg via ORAL

## 2013-01-26 MED ORDER — SODIUM CHLORIDE 0.9 % IV SOLN
1.0000 mL/kg/h | INTRAVENOUS | Status: DC
  Administered 2013-01-26: 1 mL/kg/h via INTRAVENOUS

## 2013-01-26 MED ORDER — SODIUM CHLORIDE 0.9 % IJ SOLN: 3.0000 mL | Freq: Two times a day (BID) | INTRAMUSCULAR | Status: DC

## 2013-01-26 MED ORDER — MIDAZOLAM HCL 2 MG/2ML IJ SOLN
INTRAMUSCULAR | Status: AC
  Filled 2013-01-26: qty 2

## 2013-01-26 MED ORDER — SODIUM CHLORIDE 0.9 % IJ SOLN: 3.0000 mL | INTRAMUSCULAR | Status: DC | PRN

## 2013-01-26 MED ORDER — VERAPAMIL HCL 2.5 MG/ML IV SOLN
INTRAVENOUS | Status: AC
  Filled 2013-01-26: qty 2

## 2013-01-26 NOTE — CV Procedure (Addendum)
    CARDIAC CATHETERIZATION  PROCEDURE:  Left heart catheterization with selective coronary angiography, left ventriculogram via the radial artery approach.  INDICATIONS:  72 year-old female with intermediate risk exercise treadmill test, jaw pain suggestive of angina here for cardiac catheterization prior to breast surgery.  The risks, benefits, and details of the procedure were explained to the patient, including possibilities of stroke, heart attack, death, renal impairment, arterial damage, bleeding.  The patient verbalized understanding and wanted to proceed.  Informed written consent was obtained.  PROCEDURE TECHNIQUE:  Allen's test was performed pre-and post procedure and was normal. The right radial artery site was prepped and draped in a sterile fashion. One percent lidocaine was used for local anesthesia. Using the modified Seldinger technique a 5 French hydrophilic sheath was inserted into the radial artery without difficulty. 3 mg of verapamil was administered via the sheath. A Judkins right #4 catheter with the guidance of a Versicore wire was placed in the right coronary cusp and selectively cannulated the right coronary artery. After traversing the aortic arch, 3500 units of heparin IV was administered. A Judkins left #3.5 catheter was used to selectively cannulate the left main artery. Multiple views with hand injection of Omnipaque were obtained. Catheter a pigtail catheter was used to cross into the left ventricle, hemodynamics were obtained, and a left ventriculogram was performed in the RAO position with power injection. Following the procedure, sheath was removed, patient was hemodynamically stable, hemostasis was maintained with a Terumo T band.   CONTRAST:  Total of 45 ml.    FLOUROSCOPY TIME: 2.75min.  COMPLICATIONS:  None.    HEMODYNAMICS:  Aortic pressure was 108/19mmHg; LV systolic pressure was ; LVEDP .  There was no gradient between the left ventricle and  aorta.    ANGIOGRAPHIC DATA:    Left main: No angiographically significant coronary artery disease.  Left anterior descending (LAD): 2 diagonal vessels. Wraps around apex. No angiographically significant coronary artery disease.  Circumflex artery (CIRC): Dominant vessel giving rise to the posterior descending artery. No angiographically significant coronary artery disease  Right coronary artery (RCA): Small vessel, nondominant. No angiographically significant coronary artery disease  LEFT VENTRICULOGRAM:  Left ventricular angiogram was done in the 30 RAO projection and revealed normal left ventricular wall motion and systolic function with an estimated ejection fraction of 65 %.   IMPRESSIONS:  No angiographically significant CAD Normal left ventricular systolic function.  LVEDP 14 mmHg.  Ejection fraction 65 %.  RECOMMENDATION:  Reassuring heart catheterization, continue with medical management. Based upon cardiac catheterization, she may proceed with surgery, breast surgery, with low overall cardiac risk. Reassurance. This finding is similar to prior cardiac catheterization approximately 7 years ago.

## 2013-01-26 NOTE — H&P (View-Only) (Signed)
    [address] [place], [place]  [address] Phone: [phone] Fax:  [phone]  Date:  01/17/2013   ID:  Shelby Lin, DOB 12/29/1940, MRN [id number]  PCP:  SMITH,CANDACE THIELE, MD   History of Present Illness: Shelby Lin is a 72 y.o. female with previous chest pain workup 9/14 and with intermediate risk exercise treadmill test here for followup. Dr. Hoxworth asked her to be seen because of breast cancer and need for surgery. She had 3 more spells of jaw pain lasting a few minutes relieved with hot Coke and TUMS and it passed. Her father had GERD as well and ended up having CAD and dying suddenly of a heart attack.  Previous cardiac catheterization approximately 7 years ago was "pristine". We had lengthy discussion previously about heart catheterization and I told her that I would have a low threshold to proceed.  Intermittent chest discomfort. History of hypertension, diet-controlled diabetes, fibromyalgia. Mostly neck bilateral discomfort. Squeeze. Intense radiates to jaw. 5 min. Quite severe. Not as much in chest. Can occur at rest.  Sister has CABG, CVA. Mother - angina, afib. Former smoker - quit 12 years ago. .     Wt Readings from Last 3 Encounters:  01/17/13 158 lb (71.668 kg)  01/06/13 162 lb (73.483 kg)  10/12/12 159 lb 4.8 oz (72.258 kg)     Past Medical History  Diagnosis Date  . Hypothyroidism, acquired, autoimmune   . Fibromyalgia syndrome   . Thyroiditis, autoimmune   . Multinodular goiter (nontoxic)   . Tachycardia   . Familial tremor   . Coronary artery spasm   . Dyspepsia   . Fatigue   . Combined hyperlipidemia   . Dupuytren's contracture   . COPD (chronic obstructive pulmonary disease)   . GERD (gastroesophageal reflux disease)   . Hypertension     Past Surgical History  Procedure Laterality Date  . Partial hysterectomy    . Breast lumpectomy    . Cholecystectomy      Current Outpatient Prescriptions  Medication Sig  Dispense Refill  . DULoxetine (CYMBALTA) 20 MG capsule Take 60 mg by mouth daily.       . enalapril (VASOTEC) 2.5 MG tablet Take 2.5 mg by mouth daily.        . estrogens, conjugated, (PREMARIN) 0.625 MG tablet Take 0.625 mg by mouth daily. Take daily for 21 days then do not take for 7 days.       . levothyroxine (SYNTHROID) 112 MCG tablet Take 1 tablet Synthroid 112 mcg four days a week and 1.5 tablets 3 days a week.  40 tablet  4  . Multiple Vitamin (MULTIVITAMIN) tablet Take 1 tablet by mouth daily.         No current facility-administered medications for this visit.    Allergies:    Allergies  Allergen Reactions  . Amoxicillin   . Lorabid [Loracarbef]   . Sulfa Antibiotics     Social History:  The patient  reports that she has quit smoking. She does not have any smokeless tobacco history on file. She reports that she does not drink alcohol or use illicit drugs.   ROS:  Please see the history of present illness.   No bleeding, no syncope, no orthopnea, no PND   PHYSICAL EXAM: VS:  BP 110/80  Pulse 80  Ht 5' 5" (1.651 m)  Wt 158 lb (71.668 kg)  BMI 26.29 kg/m2 Well nourished, well developed, in no acute   distress HEENT: normal Neck: no JVD Cardiac:  normal S1, S2; RRR; no murmur Lungs:  clear to auscultation bilaterally, no wheezing, rhonchi or rales Abd: soft, nontender, no hepatomegaly Ext: no edemaNormal radial pulse Skin: warm and dry Neuro: no focal abnormalities noted  EKG:  Sinus rhythm rate 80 with no other changes  Exercise treadmill test: 10/28/12: exercised for 3 minutes and 5 seconds. 5 METs of activity. Blood pressure response was normal. EKG at baseline showed nonspecific ST-T wave changes with subtle ST segment depression diffusely. This was accentuated slightly during recovery. She had shortness of breath. Has COPD.   ASSESSMENT AND PLAN:  1. Angina-for ongoing occasional bouts of jaw pain, chest pain, strong family history of CAD, intermediate risk exercise  treadmill test I would like to proceed with heart catheterization, via radial artery approach, to detect if there is any evidence of flow limiting coronary artery disease. Risks and benefits have been described to patient including stroke, heart attack, death. 2. Preoperative risk assessment-Dr. Hoxworth, breast cancer. As above. Further risk stratification after heart cath. 3. COPD-chronic. Former smoker.  Orders have been placed.   Cath Lab Visit (complete for each Cath Lab visit)  Clinical Evaluation Leading to the Procedure:   ACS: no  Non-ACS:    Anginal Classification: CCS II  Anti-ischemic medical therapy: No Therapy  Non-Invasive Test Results: Intermediate-risk stress test findings: cardiac mortality 1-3%/year  Prior CABG: No previous CABG         Signed, Chaston Bradburn, MD FACC  01/17/2013 10:31 AM    

## 2013-01-26 NOTE — Interval H&P Note (Signed)
History and Physical Interval Note:  01/26/2013 8:36 AM  Shelby Lin  has presented today for surgery, with the diagnosis of angina  The various methods of treatment have been discussed with the patient and family. After consideration of risks, benefits and other options for treatment, the patient has consented to  Procedure(s): LEFT HEART CATHETERIZATION WITH CORONARY ANGIOGRAM (N/A) as a surgical intervention .  The patient's history has been reviewed, patient examined, no change in status, stable for surgery.  I have reviewed the patient's chart and labs.  Questions were answered to the patient's satisfaction.     Nichole Neyer

## 2013-01-27 NOTE — Telephone Encounter (Signed)
Called patient and advised of lab results.

## 2013-01-31 ENCOUNTER — Telehealth: Payer: Self-pay | Admitting: Cardiology

## 2013-01-31 NOTE — Telephone Encounter (Signed)
Follow up    Pt needs lab results faxed to [facility] Dr Johna Sheriff.

## 2013-02-03 NOTE — Telephone Encounter (Signed)
Labs faxed to Dr. Johna Sheriff

## 2013-02-04 ENCOUNTER — Telehealth (INDEPENDENT_AMBULATORY_CARE_PROVIDER_SITE_OTHER): Payer: Self-pay

## 2013-02-04 ENCOUNTER — Ambulatory Visit (INDEPENDENT_AMBULATORY_CARE_PROVIDER_SITE_OTHER): Payer: Medicare Other | Admitting: Cardiology

## 2013-02-04 ENCOUNTER — Encounter: Payer: Self-pay | Admitting: Cardiology

## 2013-02-04 VITALS — BP 126/71 | HR 80 | Ht 65.0 in | Wt 162.6 lb

## 2013-02-04 DIAGNOSIS — Z8249 Family history of ischemic heart disease and other diseases of the circulatory system: Secondary | ICD-10-CM

## 2013-02-04 DIAGNOSIS — I208 Other forms of angina pectoris: Secondary | ICD-10-CM

## 2013-02-04 NOTE — Patient Instructions (Signed)
Your physician recommends that you continue on your current medications as directed. Please refer to the Current Medication list given to you today.  Your physician wants you to follow-up in: 1 year with Dr. Skains. You will receive a reminder letter in the mail two months in advance. If you don't receive a letter, please call our office to schedule the follow-up appointment.  

## 2013-02-04 NOTE — Progress Notes (Signed)
[address] [place], [place]  [address] Phone: [phone] Fax:  [phone]  Date:  02/04/2013   ID:  Shelby Lin, DOB [birth year], MRN [id number]  PCP:  Allean Found, MD   History of Present Illness: Shelby Lin is a 72 y.o. female with previous chest pain workup 9/14 and with intermediate risk exercise treadmill test here for followup after reassuring cardiac catheterization showing no evidence of CAD. Dr. Johna Sheriff asked her to be seen because of breast cancer and need for surgery and I'm comfortable with her proceeding with breast surgery with low overall cardiac risk currently.  Previously, she had more spells of jaw pain lasting a few minutes relieved with hot Coke and TUMS and it passed. Her father had GERD as well and ended up having CAD and dying suddenly of a heart attack.  Previous cardiac catheterization approximately 7 years ago was "pristine". We had lengthy discussion previously about heart catheterization and I told her that I would have a low threshold to proceed.  Intermittent chest discomfort. History of hypertension, diet-controlled diabetes, fibromyalgia. Mostly neck bilateral discomfort. Squeeze. Intense radiates to jaw. 5 min. Quite severe. Not as much in chest. Can occur at rest.  Sister has CABG, CVA. Mother - angina, afib. Former smoker - quit 12 years ago. .     Wt Readings from Last 3 Encounters:  02/04/13 162 lb 9.6 oz (73.755 kg)  01/26/13 158 lb (71.668 kg)  01/26/13 158 lb (71.668 kg)     Past Medical History  Diagnosis Date  . Hypothyroidism, acquired, autoimmune   . Fibromyalgia syndrome   . Thyroiditis, autoimmune   . Multinodular goiter (nontoxic)   . Tachycardia   . Familial tremor   . Coronary artery spasm   . Dyspepsia   . Fatigue   . Combined hyperlipidemia   . Dupuytren's contracture   . COPD (chronic obstructive pulmonary disease)   . GERD (gastroesophageal reflux disease)   . Hypertension       Past Surgical History  Procedure Laterality Date  . Partial hysterectomy    . Breast lumpectomy    . Cholecystectomy      Current Outpatient Prescriptions  Medication Sig Dispense Refill  . DULoxetine (CYMBALTA) 60 MG capsule Take 60 mg by mouth daily.      . enalapril (VASOTEC) 2.5 MG tablet Take 2.5 mg by mouth daily.        Marland Kitchen estradiol (ESTRACE) 1 MG tablet Take 1 mg alternating with 1.5 mg daily      . levothyroxine (SYNTHROID, LEVOTHROID) 112 MCG tablet Take 112 mcg by mouth daily before breakfast.      . Multiple Vitamin (MULTIVITAMIN) tablet Take 1 tablet by mouth daily.        Marland Kitchen tiotropium (SPIRIVA) 18 MCG inhalation capsule Place 18 mcg into inhaler and inhale daily.       No current facility-administered medications for this visit.    Allergies:    Allergies  Allergen Reactions  . Amoxicillin Hives  . Lorabid [Loracarbef] Hives  . Sulfa Antibiotics Hives    Social History:  The patient  reports that she has quit smoking. She does not have any smokeless tobacco history on file. She reports that she does not drink alcohol or use illicit drugs.   ROS:  Please see the history of present illness.   No bleeding, no syncope, no orthopnea, no PND   PHYSICAL EXAM: VS:  BP 126/71  Pulse 80  Ht 5\' 5"  (1.651 m)  Wt 162 lb 9.6 oz (73.755 kg)  BMI 27.06 kg/m2 Well nourished, well developed, in no acute distress HEENT: normal Neck: no JVD Cardiac:  normal S1, S2; RRR; no murmur Lungs:  clear to auscultation bilaterally, no wheezing, rhonchi or rales Abd: soft, nontender, no hepatomegaly Ext: no edemaNormal radial pulse, minor ecchymosis at catheterization site Skin: warm and dry Neuro: no focal abnormalities noted  EKG:  Sinus rhythm rate 80 with no other changes  Exercise treadmill test: 10/28/12: exercised for 3 minutes and 5 seconds. 5 METs of activity. Blood pressure response was normal. EKG at baseline showed nonspecific ST-T wave changes with subtle ST segment  depression diffusely. This was accentuated slightly during recovery. She had shortness of breath. Has COPD.   ASSESSMENT AND PLAN:  1. Angina-likely GERD related or musculoskeletal. Reassuring cardiac catheterization on 01/26/13 showing no CAD.  2. Preoperative risk assessment-Dr. Johna Sheriff, breast cancer. She may proceed with surgery with low overall risk. COPD-chronic. Former smoker. 3. She would like to see me back in a year given her strong family history of CAD. We will set up.    Signed, Donato Schultz, MD [facility]  02/04/2013 11:20 AM

## 2013-02-04 NOTE — Telephone Encounter (Signed)
Message copied by Maryan Puls on Fri Feb 04, 2013 11:54 AM ------      Message from: Docia Chuck      Created: Fri Feb 04, 2013  8:41 AM      Regarding: Shelby Lin       She left me a message late yesterday and is very upset that she has not heard back from [place].  Did you receive the clearance and did you call her?            I will be glad to call her but need to know what to tell her.            Thanks ------

## 2013-02-04 NOTE — Telephone Encounter (Signed)
Called Dr. Anne Fu office to make them aware that I am able to see pre-op clearance from office visit  from today (02/04/13)  Written orders given to OR schedulers to schedule patient for surgery.

## 2013-02-08 ENCOUNTER — Ambulatory Visit: Payer: Medicare Other | Admitting: Cardiology

## 2013-02-21 ENCOUNTER — Encounter (HOSPITAL_BASED_OUTPATIENT_CLINIC_OR_DEPARTMENT_OTHER): Payer: Self-pay | Admitting: *Deleted

## 2013-02-21 NOTE — Progress Notes (Signed)
To come in for bmet-had a cardiac work up dr Anne Fu 12/14-cleared for surgery

## 2013-02-23 ENCOUNTER — Encounter (HOSPITAL_BASED_OUTPATIENT_CLINIC_OR_DEPARTMENT_OTHER)
Admission: RE | Admit: 2013-02-23 | Discharge: 2013-02-23 | Disposition: A | Discharge status: Home / Self Care | Payer: Medicare Other | Source: Ambulatory Visit | Attending: General Surgery | Admitting: General Surgery

## 2013-02-23 DIAGNOSIS — Z01812 Encounter for preprocedural laboratory examination: Secondary | ICD-10-CM | POA: Insufficient documentation

## 2013-02-23 DIAGNOSIS — Z01818 Encounter for other preprocedural examination: Secondary | ICD-10-CM | POA: Insufficient documentation

## 2013-02-23 LAB — BASIC METABOLIC PANEL
BUN: 13 mg/dL (ref 6–23)
Calcium: 9.1 mg/dL (ref 8.4–10.5)
Chloride: 98 mEq/L (ref 96–112)
Creatinine, Ser: 0.59 mg/dL (ref 0.50–1.10)
GFR calc Af Amer: >90 mL/min (ref >90)

## 2013-02-28 ENCOUNTER — Encounter (HOSPITAL_BASED_OUTPATIENT_CLINIC_OR_DEPARTMENT_OTHER): Payer: Self-pay

## 2013-02-28 ENCOUNTER — Encounter (HOSPITAL_BASED_OUTPATIENT_CLINIC_OR_DEPARTMENT_OTHER)
Admission: RE | Disposition: A | Discharge status: Home / Self Care | Payer: Self-pay | Source: Ambulatory Visit | Attending: General Surgery

## 2013-02-28 ENCOUNTER — Ambulatory Visit
Admission: RE | Admit: 2013-02-28 | Discharge: 2013-02-28 | Disposition: A | Discharge status: Home / Self Care | Payer: Medicare Other | Source: Ambulatory Visit | Attending: General Surgery | Admitting: General Surgery

## 2013-02-28 ENCOUNTER — Ambulatory Visit (HOSPITAL_BASED_OUTPATIENT_CLINIC_OR_DEPARTMENT_OTHER): Payer: Medicare Other | Admitting: Anesthesiology

## 2013-02-28 ENCOUNTER — Encounter (HOSPITAL_BASED_OUTPATIENT_CLINIC_OR_DEPARTMENT_OTHER): Payer: Medicare Other | Admitting: Anesthesiology

## 2013-02-28 ENCOUNTER — Ambulatory Visit (HOSPITAL_BASED_OUTPATIENT_CLINIC_OR_DEPARTMENT_OTHER)
Admission: RE | Admit: 2013-02-28 | Discharge: 2013-02-28 | Disposition: A | Discharge status: Home / Self Care | Payer: Medicare Other | Source: Ambulatory Visit | Attending: General Surgery | Admitting: General Surgery

## 2013-02-28 DIAGNOSIS — F17200 Nicotine dependence, unspecified, uncomplicated: Secondary | ICD-10-CM | POA: Insufficient documentation

## 2013-02-28 DIAGNOSIS — J449 Chronic obstructive pulmonary disease, unspecified: Secondary | ICD-10-CM | POA: Insufficient documentation

## 2013-02-28 DIAGNOSIS — E039 Hypothyroidism, unspecified: Secondary | ICD-10-CM | POA: Insufficient documentation

## 2013-02-28 DIAGNOSIS — K219 Gastro-esophageal reflux disease without esophagitis: Secondary | ICD-10-CM | POA: Insufficient documentation

## 2013-02-28 DIAGNOSIS — I10 Essential (primary) hypertension: Secondary | ICD-10-CM | POA: Insufficient documentation

## 2013-02-28 DIAGNOSIS — N6089 Other benign mammary dysplasias of unspecified breast: Secondary | ICD-10-CM

## 2013-02-28 DIAGNOSIS — N6099 Unspecified benign mammary dysplasia of unspecified breast: Secondary | ICD-10-CM

## 2013-02-28 DIAGNOSIS — J4489 Other specified chronic obstructive pulmonary disease: Secondary | ICD-10-CM | POA: Insufficient documentation

## 2013-02-28 HISTORY — PX: BREAST LUMPECTOMY WITH NEEDLE LOCALIZATION: SHX5759

## 2013-02-28 HISTORY — DX: Unspecified osteoarthritis, unspecified site: M19.90

## 2013-02-28 HISTORY — DX: Presence of spectacles and contact lenses: Z97.3

## 2013-02-28 LAB — POCT HEMOGLOBIN-HEMACUE: Hemoglobin: 12.6 g/dL (ref 12.0–15.0)

## 2013-02-28 SURGERY — BREAST LUMPECTOMY WITH NEEDLE LOCALIZATION
Anesthesia: General | Site: Breast | Laterality: Right

## 2013-02-28 MED ORDER — PROPOFOL 10 MG/ML IV BOLUS
INTRAVENOUS | Status: DC | PRN
  Administered 2013-02-28: 150 mg via INTRAVENOUS

## 2013-02-28 MED ORDER — OXYCODONE HCL 5 MG/5ML PO SOLN: 5.0000 mg | Freq: Once | ORAL | Status: DC | PRN

## 2013-02-28 MED ORDER — MEPERIDINE HCL 25 MG/ML IJ SOLN: 6.2500 mg | INTRAMUSCULAR | Status: DC | PRN

## 2013-02-28 MED ORDER — HYDROMORPHONE HCL PF 1 MG/ML IJ SOLN: 0.2500 mg | INTRAMUSCULAR | Status: DC | PRN

## 2013-02-28 MED ORDER — ACETAMINOPHEN 325 MG PO TABS
650.0000 mg | ORAL_TABLET | Freq: Once | ORAL | Status: AC
  Administered 2013-02-28: 650 mg via ORAL

## 2013-02-28 MED ORDER — ONDANSETRON HCL 4 MG/2ML IJ SOLN
INTRAMUSCULAR | Status: DC | PRN
  Administered 2013-02-28: 4 mg via INTRAVENOUS

## 2013-02-28 MED ORDER — MIDAZOLAM HCL 5 MG/5ML IJ SOLN
INTRAMUSCULAR | Status: DC | PRN
  Administered 2013-02-28: 1 mg via INTRAVENOUS

## 2013-02-28 MED ORDER — ACETAMINOPHEN 325 MG PO TABS
ORAL_TABLET | ORAL | Status: AC
  Filled 2013-02-28: qty 1

## 2013-02-28 MED ORDER — BUPIVACAINE-EPINEPHRINE PF 0.5-1:200000 % IJ SOLN
INTRAMUSCULAR | Status: AC
  Filled 2013-02-28: qty 30

## 2013-02-28 MED ORDER — LIDOCAINE HCL (CARDIAC) 20 MG/ML IV SOLN
INTRAVENOUS | Status: DC | PRN
  Administered 2013-02-28: 50 mg via INTRAVENOUS

## 2013-02-28 MED ORDER — BUPIVACAINE-EPINEPHRINE 0.5% -1:200000 IJ SOLN
INTRAMUSCULAR | Status: DC | PRN
  Administered 2013-02-28: 19 mL

## 2013-02-28 MED ORDER — ONDANSETRON HCL 4 MG/2ML IJ SOLN: 4.0000 mg | Freq: Once | INTRAMUSCULAR | Status: DC | PRN

## 2013-02-28 MED ORDER — OXYCODONE HCL 5 MG PO TABS: 5.0000 mg | ORAL_TABLET | Freq: Once | ORAL | Status: DC | PRN

## 2013-02-28 MED ORDER — FENTANYL CITRATE 0.05 MG/ML IJ SOLN
INTRAMUSCULAR | Status: DC | PRN
  Administered 2013-02-28: 50 ug via INTRAVENOUS

## 2013-02-28 MED ORDER — CIPROFLOXACIN IN D5W 400 MG/200ML IV SOLN
INTRAVENOUS | Status: DC | PRN
  Administered 2013-02-28: 400 mg via INTRAVENOUS

## 2013-02-28 MED ORDER — FENTANYL CITRATE 0.05 MG/ML IJ SOLN
INTRAMUSCULAR | Status: AC
  Filled 2013-02-28: qty 6

## 2013-02-28 MED ORDER — MIDAZOLAM HCL 2 MG/2ML IJ SOLN
INTRAMUSCULAR | Status: AC
  Filled 2013-02-28: qty 2

## 2013-02-28 MED ORDER — LACTATED RINGERS IV SOLN
INTRAVENOUS | Status: DC
  Administered 2013-02-28: 11:00:00 via INTRAVENOUS
  Administered 2013-02-28: 10 mL/h via INTRAVENOUS

## 2013-02-28 MED ORDER — CHLORHEXIDINE GLUCONATE 4 % EX LIQD: 1.0000 "application " | Freq: Once | CUTANEOUS | Status: DC

## 2013-02-28 MED ORDER — HYDROCODONE-ACETAMINOPHEN 5-325 MG PO TABS: 1.0000 | ORAL_TABLET | ORAL | Status: DC | PRN

## 2013-02-28 MED ORDER — CIPROFLOXACIN IN D5W 400 MG/200ML IV SOLN
INTRAVENOUS | Status: AC
  Filled 2013-02-28: qty 200

## 2013-02-28 SURGICAL SUPPLY — 47 items
BLADE SURG 10 STRL SS (BLADE) IMPLANT
BLADE SURG 15 STRL LF DISP TIS (BLADE) ×1 IMPLANT
BLADE SURG 15 STRL SS (BLADE) ×1
CANISTER SUCT 1200ML W/VALVE (MISCELLANEOUS) IMPLANT
CHLORAPREP W/TINT 26ML (MISCELLANEOUS) ×2 IMPLANT
CLIP TI MEDIUM 6 (CLIP) IMPLANT
CLIP TI WIDE RED SMALL 6 (CLIP) IMPLANT
COVER MAYO STAND STRL (DRAPES) ×2 IMPLANT
COVER TABLE BACK 60X90 (DRAPES) ×2 IMPLANT
DERMABOND ADVANCED (GAUZE/BANDAGES/DRESSINGS)
DERMABOND ADVANCED .7 DNX12 (GAUZE/BANDAGES/DRESSINGS) IMPLANT
DEVICE DUBIN W/COMP PLATE 8390 (MISCELLANEOUS) IMPLANT
DRAPE PED LAPAROTOMY (DRAPES) ×2 IMPLANT
DRAPE UTILITY XL STRL (DRAPES) ×2 IMPLANT
ELECT COATED BLADE 2.86 ST (ELECTRODE) ×2 IMPLANT
ELECT REM PT RETURN 9FT ADLT (ELECTROSURGICAL) ×2
ELECTRODE REM PT RTRN 9FT ADLT (ELECTROSURGICAL) ×1 IMPLANT
GLOVE BIO SURGEON STRL SZ 6.5 (GLOVE) ×2 IMPLANT
GLOVE BIOGEL PI IND STRL 8 (GLOVE) ×1 IMPLANT
GLOVE BIOGEL PI INDICATOR 8 (GLOVE) ×1
GLOVE ECLIPSE 6.5 STRL STRAW (GLOVE) ×2 IMPLANT
GLOVE EXAM NITRILE EXT CUFF MD (GLOVE) ×2 IMPLANT
GLOVE SS BIOGEL STRL SZ 7.5 (GLOVE) ×1 IMPLANT
GLOVE SUPERSENSE BIOGEL SZ 7.5 (GLOVE) ×1
GOWN STRL REUS W/ TWL LRG LVL3 (GOWN DISPOSABLE) ×1 IMPLANT
GOWN STRL REUS W/ TWL XL LVL3 (GOWN DISPOSABLE) ×1 IMPLANT
GOWN STRL REUS W/TWL LRG LVL3 (GOWN DISPOSABLE) ×1
GOWN STRL REUS W/TWL XL LVL3 (GOWN DISPOSABLE) ×1
KIT MARKER MARGIN INK (KITS) IMPLANT
NEEDLE HYPO 25X1 1.5 SAFETY (NEEDLE) ×2 IMPLANT
NS IRRIG 1000ML POUR BTL (IV SOLUTION) IMPLANT
PACK BASIN DAY SURGERY FS (CUSTOM PROCEDURE TRAY) ×2 IMPLANT
PENCIL BUTTON HOLSTER BLD 10FT (ELECTRODE) ×2 IMPLANT
SLEEVE SCD COMPRESS KNEE MED (MISCELLANEOUS) IMPLANT
STAPLER VISISTAT 35W (STAPLE) IMPLANT
SUT MON AB 3-0 SH 27 (SUTURE)
SUT MON AB 3-0 SH27 (SUTURE) IMPLANT
SUT MON AB 5-0 PS2 18 (SUTURE) ×2 IMPLANT
SUT SILK 3 0 SH 30 (SUTURE) IMPLANT
SUT VIC AB 4-0 BRD 54 (SUTURE) IMPLANT
SUT VICRYL 3-0 CR8 SH (SUTURE) ×2 IMPLANT
SYR BULB 3OZ (MISCELLANEOUS) IMPLANT
SYR CONTROL 10ML LL (SYRINGE) ×2 IMPLANT
TOWEL OR 17X24 6PK STRL BLUE (TOWEL DISPOSABLE) ×2 IMPLANT
TOWEL OR NON WOVEN STRL DISP B (DISPOSABLE) ×2 IMPLANT
TUBE CONNECTING 20X1/4 (TUBING) IMPLANT
YANKAUER SUCT BULB TIP NO VENT (SUCTIONS) IMPLANT

## 2013-02-28 NOTE — Anesthesia Procedure Notes (Signed)
Procedure Name: LMA Insertion Date/Time: 02/28/2013 11:51 AM Performed by: Melynda Ripple D Pre-anesthesia Checklist: Patient identified, Emergency Drugs available, Suction available and Patient being monitored Patient Re-evaluated:Patient Re-evaluated prior to inductionOxygen Delivery Method: Circle System Utilized Preoxygenation: Pre-oxygenation with 100% oxygen Intubation Type: IV induction Ventilation: Mask ventilation without difficulty LMA: LMA inserted LMA Size: 4.0 Number of attempts: 1 Airway Equipment and Method: bite block Placement Confirmation: positive ETCO2 and breath sounds checked- equal and bilateral Tube secured with: Tape Dental Injury: Teeth and Oropharynx as per pre-operative assessment

## 2013-02-28 NOTE — Discharge Instructions (Signed)
°Post Anesthesia Home Care Instructions ° °Activity: °Get plenty of rest for the remainder of the day. A responsible adult should stay with you for 24 hours following the procedure.  °For the next 24 hours, DO NOT: °-Drive a car °-Operate machinery °-Drink alcoholic beverages °-Take any medication unless instructed by your physician °-Make any legal decisions or sign important papers. ° °Meals: °Start with liquid foods such as gelatin or soup. Progress to regular foods as tolerated. Avoid greasy, spicy, heavy foods. If nausea and/or vomiting occur, drink only clear liquids until the nausea and/or vomiting subsides. Call your physician if vomiting continues. ° °Special Instructions/Symptoms: °Your throat may feel dry or sore from the anesthesia or the breathing tube placed in your throat during surgery. If this causes discomfort, gargle with warm salt water. The discomfort should disappear within 24 hours. ° ° ° ° ° ° ° °[facility] °Office Phone Number [phone] ° °BREAST BIOPSY/ PARTIAL MASTECTOMY: POST OP INSTRUCTIONS ° °Always review your discharge instruction sheet given to you by the facility where your surgery was performed. ° °IF YOU HAVE DISABILITY OR FAMILY LEAVE FORMS, YOU MUST BRING THEM TO THE OFFICE FOR PROCESSING.  DO NOT GIVE THEM TO YOUR DOCTOR. ° °1. A prescription for pain medication may be given to you upon discharge.  Take your pain medication as prescribed, if needed.  If narcotic pain medicine is not needed, then you may take acetaminophen (Tylenol) or ibuprofen (Advil) as needed. °2. Take your usually prescribed medications unless otherwise directed °3. If you need a refill on your pain medication, please contact your pharmacy.  They will contact our office to request authorization.  Prescriptions will not be filled after 5pm or on week-ends. °4. You should eat very light the first 24 hours after surgery, such as soup, crackers, pudding, etc.  Resume your normal diet the  day after surgery. °5. Most patients will experience some swelling and bruising in the breast.  Ice packs and a good support bra will help.  Swelling and bruising can take several days to resolve.  °6. It is common to experience some constipation if taking pain medication after surgery.  Increasing fluid intake and taking a stool softener will usually help or prevent this problem from occurring.  A mild laxative (Milk of Magnesia or Miralax) should be taken according to package directions if there are no bowel movements after 48 hours. °7. Unless discharge instructions indicate otherwise, you may remove your bandages 24-48 hours after surgery, and you may shower at that time.  You may have steri-strips (small skin tapes) in place directly over the incision.  These strips should be left on the skin for 7-10 days.  If your surgeon used skin glue on the incision, you may shower in 24 hours.  The glue will flake off over the next 2-3 weeks.  Any sutures or staples will be removed at the office during your follow-up visit. °8. ACTIVITIES:  You may resume regular daily activities (gradually increasing) beginning the next day.  Wearing a good support bra or sports bra minimizes pain and swelling.  You may have sexual intercourse when it is comfortable. °a. You may drive when you no longer are taking prescription pain medication, you can comfortably wear a seatbelt, and you can safely maneuver your car and apply brakes. °b. RETURN TO WORK:  ______________________________________________________________________________________ °9. You should see your doctor in the office for a follow-up appointment approximately two weeks after your surgery.  Your doctor’s nurse will   typically make your follow-up appointment when she calls you with your pathology report.  Expect your pathology report 2-3 business days after your surgery.  You may call to check if you do not hear from us after three days. °10. OTHER INSTRUCTIONS:  _______________________________________________________________________________________________ _____________________________________________________________________________________________________________________________________ °_____________________________________________________________________________________________________________________________________ °_____________________________________________________________________________________________________________________________________ ° °WHEN TO CALL YOUR DOCTOR: °1. Fever over 101.0 °2. Nausea and/or vomiting. °3. Extreme swelling or bruising. °4. Continued bleeding from incision. °5. Increased pain, redness, or drainage from the incision. ° °The clinic staff is available to answer your questions during regular business hours.  Please don’t hesitate to call and ask to speak to one of the nurses for clinical concerns.  If you have a medical emergency, go to the nearest emergency room or call 911.  A surgeon from [facility] is always on call at the hospital. ° °For further questions, please visit [email or web address]  °

## 2013-02-28 NOTE — Transfer of Care (Signed)
Immediate Anesthesia Transfer of Care Note  Patient: Shelby Lin  Procedure(s) Performed: Procedure(s): RIGHT BREAST NEEDLE LOCALIZATION LUMPECTOMY  (Right)  Patient Location: PACU  Anesthesia Type:General  Level of Consciousness: awake, alert  and oriented  Airway & Oxygen Therapy: Patient Spontanous Breathing and Patient connected to face mask oxygen  Post-op Assessment: Report given to PACU RN and Post -op Vital signs reviewed and stable  Post vital signs: Reviewed and stable  Complications: No apparent anesthesia complications

## 2013-02-28 NOTE — H&P (Signed)
Subjective:   diagnosis atypical ductal hyperplasia of the right breast  [name], female DOB: December 12, 1940, 73 y.o. MRN: [id number]  HPI  Patient is a very pleasant 73 year old female referred by Dr. Miquel Dunn following a recent needle core biopsy of the right breast showing atypical ductal hyperplasia. The patient has a history of fibrocystic breast disease but no personal history of breast cancer. She recently presented for a screening mammogram. This revealed a new small area of clustered calcifications within the right breast. She was recalled for diagnostic mammogram which confirmed a 3 x 3 x 3 mm group of heterogeneous calcifications in the lower inner quadrant of the right breast. Stereotactic biopsy was recommended and performed. This has revealed atypical ductal hyperplasia associated with microcalcifications. Complete excision of the area was recommended and she was referred. She has not noticed any breast lumps or nipple discharge or skin changes. Her right nipple has been somewhat sensitive for about a month.    Past Medical History   Diagnosis  Date   .  Hypothyroidism, acquired, autoimmune    .  Fibromyalgia syndrome    .  Thyroiditis, autoimmune    .  Multinodular goiter (nontoxic)    .  Tachycardia    .  Familial tremor    .  Coronary artery spasm    .  Dyspepsia    .  Fatigue    .  Combined hyperlipidemia    .  Dupuytren's contracture    .  COPD (chronic obstructive pulmonary disease)    .  GERD (gastroesophageal reflux disease)    .  Hypertension     Past Surgical History   Procedure  Laterality  Date   .  Partial hysterectomy     .  Breast lumpectomy     .  Cholecystectomy      Current Outpatient Prescriptions   Medication  Sig  Dispense  Refill   .  DULoxetine (CYMBALTA) 20 MG capsule  Take 60 mg by mouth daily.     .  enalapril (VASOTEC) 2.5 MG tablet  Take 2.5 mg by mouth daily.     Marland Kitchen  estrogens, conjugated, (PREMARIN) 0.625 MG tablet  Take 0.625  mg by mouth daily. Take daily for 21 days then do not take for 7 days.     Marland Kitchen  levothyroxine (SYNTHROID) 112 MCG tablet  Take 1 tablet Synthroid 112 mcg four days a week and 1.5 tablets 3 days a week.  40 tablet  4   .  Multiple Vitamin (MULTIVITAMIN) tablet  Take 1 tablet by mouth daily.      No current facility-administered medications for this visit.    Allergies   Allergen  Reactions   .  Amoxicillin    .  Lorabid [Loracarbef]    .  Sulfa Antibiotics    Review of Systems  Respiratory: Negative.  Cardiovascular: Positive for chest pain. Negative for palpitations and leg swelling.  Gastrointestinal: Positive for diarrhea and constipation. Negative for abdominal pain.   Objective:   Physical Exam  BP 123/63  Pulse 89  Temp(Src) 97.4 F (36.3 C) (Oral)  Ht 5\' 5"  (1.651 m)  Wt 158 lb 6.4 oz (71.85 kg)  BMI 26.36 kg/m2  SpO2 98%  General: Alert, well-developed Caucasian female, in no distress  Skin: Warm and dry without rash or infection.  HEENT: No palpable masses or thyromegaly. Sclera nonicteric. Pupils equal round and reactive. Oropharynx clear.  Lymph nodes: No cervical, supraclavicular, or inguinal  nodes palpable.  Breasts: Healing core biopsy site inferior medial right breast. I cannot feel any masses in either breast. No palpable axillary adenopathy.  Lungs: Breath sounds clear and equal without increased work of breathing  Cardiovascular: Regular rate and rhythm without murmur. No JVD or edema. Peripheral pulses intact.  Abdomen: Nondistended. Soft and nontender. No masses palpable. No organomegaly. No palpable hernias.  Extremities: No edema or joint swelling or deformity. No chronic venous stasis changes.  Neurologic: Alert and fully oriented. Gait normal.  Assessment:   Recent mammogram showing a small cluster of heterogeneous calcifications in core biopsy has revealed atypical ductal hyperplasia. I have recommended needle localized excision. We discussed the  indications for the procedure and its nature as well as risks of bleeding infection and anesthetic complications. She has COPD that is very stable. She has a diagnosis of coronary spasm and has been evaluated by cardiology preoperatively with a coronary angiogram and cardiac clearance has been obtained.  Plan:   Needle localized right breast lumpectomy as an outpatient under general

## 2013-02-28 NOTE — Op Note (Signed)
Preoperative Diagnosis: Right breast atypical hyperplasia  Postoprative Diagnosis: Right breast atypical hyperplasia  Procedure: Procedure(s): RIGHT BREAST NEEDLE LOCALIZATION LUMPECTOMY    Surgeon: Excell Seltzer T   Assistants: none  Anesthesia:  General LMA anesthesia  Indications: patient is a 73 year old female status post recent stereotactic large core needle biopsy for a 3 mm area of new calcifications in the medial right breast. Pathology has revealed atypical ductal hyperplasia. We have recommended proceeding with needle localized excision. The details regarding indications in nature and risks of the surgery have been discussed and documented elsewhere  Procedure Detail:  Following accurate needle localization at the breast center the patient is brought to the operating room, placed in the supine position on the operating table, and laryngeal mask general anesthesia induced. The right breast was widely sterilely prepped and draped. Patient timeout was performed and correct procedure verified. The wire was placed from a medial approach. I made a transverse incision in the medial aspect of the breast overlying the area of abnormality, lateral to the wire insertion site. Dissection was carried down to the subcutaneous tissue with cautery to the breast capsule. The wire was encountered and brought into the incision. I then excised the specimen of breast tissue around the shaft and the tip of the wire. The specimen mammogram showed the wire and the clip within the specimen. The specimen had been oriented with sutures. The soft tissue was infiltrated with Marcaine and complete hemostasis obtained. The deep tissue was closed with interrupted 3-0 Vicryl and the skin with subcuticular 4-0 Monocryl and Dermabond. Sponge needle and counts were correct.   Estimated Blood Loss:  Minimal         Drains: none  Blood Given: none          Specimens: right breast tissue        Complications:  *  No complications entered in OR log *         Disposition: PACU - hemodynamically stable.         Condition: stable

## 2013-02-28 NOTE — Anesthesia Preprocedure Evaluation (Addendum)
Anesthesia Evaluation  Patient identified by MRN, date of birth, ID band Patient awake    Reviewed: Allergy & Precautions, H&P , NPO status , Patient's Chart, lab work & pertinent test results  Airway Mallampati: I TM Distance: >3 FB Neck ROM: Full    Dental   Pulmonary COPDformer smoker,          Cardiovascular hypertension, Pt. on medications     Neuro/Psych    GI/Hepatic GERD-  ,  Endo/Other  Hypothyroidism   Renal/GU      Musculoskeletal   Abdominal   Peds  Hematology   Anesthesia Other Findings   Reproductive/Obstetrics                         Anesthesia Physical Anesthesia Plan  ASA: II  Anesthesia Plan: General   Post-op Pain Management:    Induction: Intravenous  Airway Management Planned: LMA  Additional Equipment:   Intra-op Plan:   Post-operative Plan: Extubation in OR  Informed Consent: I have reviewed the patients History and Physical, chart, labs and discussed the procedure including the risks, benefits and alternatives for the proposed anesthesia with the patient or authorized representative who has indicated his/her understanding and acceptance.     Plan Discussed with: CRNA and Surgeon  Anesthesia Plan Comments:       Anesthesia Quick Evaluation

## 2013-02-28 NOTE — Anesthesia Postprocedure Evaluation (Signed)
Anesthesia Post Note  Patient: Shelby Lin  Procedure(s) Performed: Procedure(s) (LRB): RIGHT BREAST NEEDLE LOCALIZATION LUMPECTOMY  (Right)  Anesthesia type: general  Patient location: PACU  Post pain: Pain level controlled  Post assessment: Patient's Cardiovascular Status Stable  Last Vitals:  Filed Vitals:   02/28/13 1315  BP: 116/53  Pulse: 80  Temp:   Resp: 15    Post vital signs: Reviewed and stable  Level of consciousness: sedated  Complications: No apparent anesthesia complications

## 2013-03-01 ENCOUNTER — Encounter (HOSPITAL_BASED_OUTPATIENT_CLINIC_OR_DEPARTMENT_OTHER): Payer: Self-pay | Admitting: General Surgery

## 2013-03-04 ENCOUNTER — Telehealth (INDEPENDENT_AMBULATORY_CARE_PROVIDER_SITE_OTHER): Payer: Self-pay | Admitting: General Surgery

## 2013-03-04 NOTE — Telephone Encounter (Signed)
Called the patient and discussed pathology report

## 2013-03-07 ENCOUNTER — Telehealth (INDEPENDENT_AMBULATORY_CARE_PROVIDER_SITE_OTHER): Payer: Self-pay

## 2013-03-07 NOTE — Telephone Encounter (Signed)
Message copied by Ivor Costa on Mon Mar 07, 2013  2:25 PM ------      Message from: Aviva Signs      Created: Mon Mar 07, 2013  1:02 PM       Pt had lumpectomy on 1-5....[place] told pt to make a 3 week po apt .Her po apt is on 1-11.does this need to be r/s out a little or leave where its at?Marland Kitchen..pt is waiting on an answer..thanks ------

## 2013-03-07 NOTE — Telephone Encounter (Signed)
Called patient to schedule patient 1st post appt for 03/17/12 @ 8:45 am w/Dr. Excell Seltzer.

## 2013-03-11 ENCOUNTER — Encounter (INDEPENDENT_AMBULATORY_CARE_PROVIDER_SITE_OTHER): Payer: Medicare Other | Admitting: General Surgery

## 2013-03-16 ENCOUNTER — Other Ambulatory Visit: Payer: Self-pay | Admitting: *Deleted

## 2013-03-16 DIAGNOSIS — E038 Other specified hypothyroidism: Secondary | ICD-10-CM

## 2013-03-17 ENCOUNTER — Encounter (INDEPENDENT_AMBULATORY_CARE_PROVIDER_SITE_OTHER): Payer: Self-pay | Admitting: General Surgery

## 2013-03-17 ENCOUNTER — Ambulatory Visit (INDEPENDENT_AMBULATORY_CARE_PROVIDER_SITE_OTHER): Payer: Medicare Other | Admitting: General Surgery

## 2013-03-17 VITALS — BP 118/70 | HR 72 | Temp 97.7°F | Resp 14 | Ht 65.0 in | Wt 160.8 lb

## 2013-03-17 DIAGNOSIS — Z09 Encounter for follow-up examination after completed treatment for conditions other than malignant neoplasm: Secondary | ICD-10-CM

## 2013-03-17 NOTE — Progress Notes (Signed)
History: Patient returns following needle localized right breast lumpectomy for atypical hyperplasia on core biopsy. She reports no problems from surgery.  Exam: Her incision is well-healed without complication.  Pathology confirmed atypical ductal hyperplasia was noted as a malignancy.  Assessment and plan: Doing well following lumpectomy without complication for atypical hyperplasia.  We discussed that this is a slight risk factor for subsequent breast cancer and she is encouraged to keep up with her routine screening. Return when necessary.

## 2013-04-13 ENCOUNTER — Ambulatory Visit: Payer: Medicare Other | Admitting: "Endocrinology

## 2013-04-28 LAB — T4, FREE: FREE T4: 1.24 ng/dL (ref 0.80–1.80)

## 2013-04-28 LAB — CALCIUM: Calcium: 9.7 mg/dL (ref 8.4–10.5)

## 2013-04-28 LAB — T3, FREE: T3 FREE: 3.2 pg/mL (ref 2.3–4.2)

## 2013-04-28 LAB — TSH: TSH: 0.426 u[IU]/mL (ref 0.350–4.500)

## 2013-04-29 LAB — VITAMIN D 25 HYDROXY (VIT D DEFICIENCY, FRACTURES): Vit D, 25-Hydroxy: 36 ng/mL (ref 30–89)

## 2013-05-02 ENCOUNTER — Encounter: Payer: Self-pay | Admitting: "Endocrinology

## 2013-05-02 ENCOUNTER — Ambulatory Visit (INDEPENDENT_AMBULATORY_CARE_PROVIDER_SITE_OTHER): Payer: Medicare Other | Admitting: "Endocrinology

## 2013-05-02 VITALS — BP 138/80 | HR 87 | Wt 157.0 lb

## 2013-05-02 DIAGNOSIS — H8109 Meniere's disease, unspecified ear: Secondary | ICD-10-CM

## 2013-05-02 DIAGNOSIS — M79606 Pain in leg, unspecified: Secondary | ICD-10-CM

## 2013-05-02 DIAGNOSIS — R1013 Epigastric pain: Secondary | ICD-10-CM

## 2013-05-02 DIAGNOSIS — E038 Other specified hypothyroidism: Secondary | ICD-10-CM

## 2013-05-02 DIAGNOSIS — M79609 Pain in unspecified limb: Secondary | ICD-10-CM

## 2013-05-02 DIAGNOSIS — E049 Nontoxic goiter, unspecified: Secondary | ICD-10-CM

## 2013-05-02 DIAGNOSIS — G44229 Chronic tension-type headache, not intractable: Secondary | ICD-10-CM

## 2013-05-02 DIAGNOSIS — K3189 Other diseases of stomach and duodenum: Secondary | ICD-10-CM

## 2013-05-02 DIAGNOSIS — E063 Autoimmune thyroiditis: Secondary | ICD-10-CM

## 2013-05-02 NOTE — Patient Instructions (Addendum)
Follow up visit in 6 months. Reduce Synthroid to one 112 mcg tablet on 5 days per week and 1.5 tablets on Wednesdays and Sundays.

## 2013-05-02 NOTE — Progress Notes (Signed)
Subjective:  Patient Name: Shelby Lin Date of Birth: February 05, 1941  MRN: [id number]  Shelby Lin  presents to the office today for follow-up of her hypothyroidism secondary to Hashimoto's disease, questionable multinodular goiter, tachycardia, tremor, dyspepsia, fatigue, fibromyalgia, hyperlipidemia, night sweats, and tremor.  HISTORY OF PRESENT ILLNESS:   Shelby Lin is a 73 y.o. Caucasian woman.  Shelby Lin was unaccompanied.  1. Shelby Lin was referred to me on 03/25/2005 by her primary care provider, Dr. Carol Ada, for evaluation of her thyroid problems. Shelby Lin was 36 at that time.   A. At about age 36 the patient developed thyroid problems and was put on a thyroid medicine. Later when she became pregnant her obstetrician took her off that medicine. At about age 11, she developed severe fatigue and weight gain. She was put back on Synthroid. Her dose of Synthroid was 88 mcg per day for many years. In January 2006, she had fluctuations in her thyroid function tests. Synthroid was increased to 100 mcg and later to 125 mcg per day. The Synthroid dose was then reduced to 75 mcg. After blood tests about 9 months prior to her first appointment with me, the patient was put back on a dose of 88 mcg per day. When the patient was having fluctuations in her thyroid tests, her thyroid gland became visibly enlarged and felt full and tender to palpation. She was also more hoarse during that period. Hoarseness comes and goes in parallel with the thyroid gland swelling. Ultrasound studies of the thyroid in January 2006 and January 2007 showed multinodular goiter.   B. Thyroid function tests performed at that first visit showed a TSH of 4.139, free T4 1.22, and free T3 of 2.7. Because any TSH at that time greater than 3 was really high, I increased her Synthroid from 88 to 100 mcg per day. TPO antibody was markedly positive at 954.0, c/w the diagnosis of Hashimoto's thyroiditis.Marland Kitchen TSI was quite normal at 0.9.   2. During  the last 8 years the patient has had several flare-ups of Hashimoto's disease. In February 2011 we had to increase her Synthroid dose to 125 mcg per day. Later that year we reduced the dose to 125 mcg 5 days per week and later to 88 mcg/day 6 days per week.    3. The patient's last [facility] visit was on 10/12/12. In the interim, she has been through a lot.   A. She had surgery on the right breast on 02/28/13 to remove a small mass containing calcium. Fortunately, the mass was benign.  B. In preparation for the surgery and because she had had jaw pain that might indicate atypical angina, she underwent a cardiac catheterization on 01/26/13. No significant coronary artery disease was seen.     C. She then had a viral URI that lasted for about 3 weeks.  D. [name] stopped her estrogen due to concerns that the estrogen might aggravate breast problems.    E. Her feet still bother her a lot. Her feet burn in the forefoot a lot. Steroid injections did not help. The podiatrist wants to do surgery on her right bunion and right hammer toe. Shelby Lin does not want to have surgery now.   F. Her Meniere's disease of the left ear occasionally acts up, but she has not had any significant vertigo in months. Her ENT doctor had advised doing surgery on her left mastoid area, but she has declined. The hearing in the left ear varies. Her sensation of head fullness continues,  especially on the left side. She has not had to take meclizine recently. She is on a low salt diet to reduce edema within the left ear.   G. Her COPD is fine as long as she uses her spirometer daily.    H. She continues to have fronto-temporal headaches, occasionally involving the nuchal area and trapezius areas. Her trapezius areas remain tight and tender, but are better. The cervical stretching exercises help when she does them.   I. She remains on Synthroid, 112 mcg, one tablet 4 days per week and 1.5 tablets three days per week. She also takes omeprazole  and enalapril.   J. Her night sweats have been better.    K. She still has three wens on her left thumb tendons. She had them aspirated, but they recurred. Fortunately, they are less prominent and are no longer painful.   L. She is intermittently, chronically hoarse. The hoarseness comes and goes. She has a lot of postnasal drip and some cough. She is not sure if her allergies affect the hoarseness or not.   M. Her depression is "better", partly due to resuming Cymbalta, partly due to her Meniere's disease and vertigo improving, and partly due to finding new things to do since she retired.   4. Pertinent Review of Systems:  Constitutional: She feels "all right.". Her stamina and energy vary, from pretty good to not so good.    Eyes: Vision is good long as she wears her glasses. She had an eye exam about one year ago. There are no significant eye complaints.  Neck: The thyroid gland has not seemed to swell lately. The patient has no complaints of anterior neck soreness, tenderness,  pressure, discomfort, or difficulty swallowing.  Heart: She has not had any chest symptoms. Heart rate increases with exercise or other physical activity. The patient has no other complaints of palpitations, irregular heat beats, chest pain, or chest pressure. Gastrointestinal: Her acid reflux and dyspepsia are pretty well controlled by her omeprazole. She still eats some [place] food, but not as frequently.    Legs: Muscle mass and strength seem normal. Legs were stronger when she was walking more. The episodic leg numbness and burning, have resolved. She has not had any edema recently.   Feet: Her episodic foot pains are about the same.   GYN: She is now off Premarin. Emotional/psychiatric: She feels much better overall.  Mental: She does "fine" at thinking, paying attention, remembering, and making decisions.    PAST MEDICAL, FAMILY, AND SOCIAL HISTORY:  Past Medical History  Diagnosis Date  . Hypothyroidism,  acquired, autoimmune   . Fibromyalgia syndrome   . Thyroiditis, autoimmune   . Multinodular goiter (nontoxic)   . Tachycardia   . Familial tremor   . Coronary artery spasm   . Dyspepsia   . Fatigue   . Combined hyperlipidemia   . Dupuytren's contracture   . COPD (chronic obstructive pulmonary disease)   . GERD (gastroesophageal reflux disease)   . Hypertension   . Wears glasses   . Arthritis     Family History  Problem Relation Age of Onset  . Cancer Mother   . Thyroid disease Neg Hx   . Hypertension Mother   . Hypertension Sister   . Diabetes Sister   . Heart attack Father   . Sudden death Father   . Stroke Mother   . Stroke Sister     Current outpatient prescriptions:DULoxetine (CYMBALTA) 60 MG capsule, Take 60 mg by mouth daily.,  Disp: , Rfl: ;  enalapril (VASOTEC) 2.5 MG tablet, Take 2.5 mg by mouth daily. , Disp: , Rfl: ;  levothyroxine (SYNTHROID, LEVOTHROID) 112 MCG tablet, Take 112 mcg by mouth daily before breakfast., Disp: , Rfl: ;  Multiple Vitamin (MULTIVITAMIN) tablet, Take 1 tablet by mouth daily.  , Disp: , Rfl:  omeprazole (PRILOSEC) 20 MG capsule, Take 20 mg by mouth daily., Disp: , Rfl: ;  tiotropium (SPIRIVA) 18 MCG inhalation capsule, Place 18 mcg into inhaler and inhale daily., Disp: , Rfl: ;  HYDROcodone-acetaminophen (NORCO/VICODIN) 5-325 MG per tablet, Take 1-2 tablets by mouth every 4 (four) hours as needed., Disp: 30 tablet, Rfl: 0  Allergies as of 05/02/2013 - Review Complete 05/02/2013  Allergen Reaction Noted  . Amoxicillin Hives 06/18/2010  . Codeine Nausea And Vomiting 02/21/2013  . Lorabid [loracarbef] Hives 06/18/2010  . Sulfa antibiotics Hives 06/18/2010    1. Work and Family: She retired in June 2013. She stays fairly busy, to include a part-time job. She and her husband are due for a trip to [place] with their grandchildren soon.  2. Activities: Physical activities are somewhat restricted due to foot pain and fatigue. She walks more.    3. Smoking, alcohol, or drugs: None 4. Primary Care Provider: Dr. Carol Ada, [facility] [place]: There are no other significant problems involving Shelby Lin's other body systems.   Objective:  Vital Signs:  BP 138/80  Pulse 87  Wt 157 lb (71.215 kg) She has lost 2 lbs since last visit.   PHYSICAL EXAMINATION  Constitutional: The patient looks quite good today. She is alert, bright,  upbeat, and perky.   Eyes: There is no obvious arcus or proptosis. Moisture appears normal. Mouth: The oropharynx and tongue appear normal. Oral moisture is normal. Neck: The neck appears to be visibly normal. No carotid bruits are noted. The thyroid gland is within normal limits for size at about 18-20 grams. The consistency of the thyroid gland is normal. The thyroid gland is not tender to palpation. Her trapezius girdle is still somewhat tight and somewhat tender. Her cervical range of motion is somewhat restricted, but better. Lungs: The lungs are clear to auscultation. Air movement is good. Heart: Heart rate and rhythm are regular. Heart sounds S1 and S2 are normal. I did not appreciate any pathologic cardiac murmurs. Abdomen: The abdomen is somewhat enlarged. Bowel sounds are normal. There is no obvious hepatomegaly, splenomegaly, or other mass effect.  Arms: Muscle size and bulk are normal for age. Hands: There is a trace-to-1+ tremor. Phalangeal and metacarpophalangeal joints are normal. Palmar muscles are low-normal. Palmar moisture is also normal. Legs: Muscles appear normal for age. No edema is present. She has pain to palpation in the distal shins. Neurologic: Strength is normal for age in both the upper and lower extremities. Muscle tone is normal. Sensation to touch is normal in the legs.   LAB DATA:   04/28/13: TSH 0.426, free T4 1.24, free T3 3.2; calcium 9.7, PTH pending, 25-hydroxy vitamin D 36  10/08/12: TSH 2.106, free T4 1.05, free T3 2.6; 25-OH vitamin  D 37, PTH 39.4, calcium 9.1  04/02/12: TSH 1.073, free T4 1.31, free T3 2.9, calcium 9.5, 25-hydroxy vitamin D 31 - PTH was ordered but not done. I have re-ordered it this morning.  09/30/11: TSH 8.095, free T4 1.16, free T3 2.4. PTH 24, calcium 9.3, 25-hydroxy vitamin D 33, 1,25-dihydroxy vitamin D 95 11/21/10: Calcium was 9.5. Her PTH  was 37.6. 25-hydroxy vitamin D was 41. 1, 25-dihydroxy vitamin D was 85, which was slightly elevated. B12 was 352 (211-911)    10/12/12: US thyroid gland: Small 10 x 4 x 5 mm nodule or adjacent lymph node or parathyroid adenoma. Diffuse echogenicity. As I read the [place], there is definitely a lot of echogenicity c/w Hashimoto's thyroiditis. There may be a small nodule that is actually a bit less than one cm in longest dimension. This is unchanged since June 2011.   Assessment and Plan:   ASSESSMENT:  1. Hypothyroid: She was euthyroid in February and again in August 2014, but is borderline hyperthyroid now. She appears to be absorbing her Synthroid better.  We will continue to follow her TFTs serially and adjust Synthroid doses as needed.  2. Thyroiditis: The patient's Hashimoto's disease has been clinically quiescent. I still suspect she will lose more thyroid cells in the next 2-3 years. 3. Headaches: Headaches appear to be "tension" headaches. They have improved with cervical stretching exercises. Her cervical stretching exercises and "gull wing" pillow help her.  4. Multinodular goiter: Thyroid gland is again within normal limits today. The waxing and waning of thyroid gland size is c/w evolving Hashimoto's disease. Although some [place] studies have shown a nodule, her [place] study in June 2011 showed either a questionable nodule or demonstrated the echo texture heterogeneity that is commonly seen with Hashimoto's disease. Her [place] in August 2014 was essentially unchanged.  5. Night sweats: The night sweats are much less frequent and severe. There is no indication of a  pheochromocytoma.  6. Leg pains: Calcium is normal, just above the 50%. Her vitamin D is also normal. Her PTH value is pending. She is still taking her Caltrate-D once a day.  7. Meniere's disease: Her vertigo has essentially resolved. The left ear Meniere's disease is still an issue for her. She may require surgery. Her hearing in the left ear varies. Her sensation of head fullness persists. It is likely that her environmental allergies are responsible for some serous fluid buildup in the ear.  8. Hoarseness: The fact that her hoarseness and post nasal drip come and go essentially in parallel indicate that the hoarseness is likely due to flare ups of her allergies.  9. Dyspepsia: This problem is controlled with omeprazole. 10. Chest pressure with radiation to her jaws: Her cardiac cath was clean. Therefore, her symptoms are most likely due to esophageal spasm.  11. Hypertension: She is doing well with enalapril.  12. Depression: She looks great today.    PLAN:  1. Diagnostic: Repeat TFTs and calcium/ PTH, and 25-hydroxy vitamin D levels 2 weeks before the next visit. 2. Therapeutic: Do her best to continue daily physical activity. Reduce Synthroid to 112 mcg 5 days per week and 1.5 tabs on Wednesday and Sunday.  3. Patient education: We discussed the pathophysiologies of headaches, Hashimoto's disease, and Meniere's disease.  4. Follow-up: 6 months  Level of Service: This visit lasted in excess of 45 minutes. More than 50% of the visit was devoted to counseling.  Sherrlyn Hock

## 2013-05-12 ENCOUNTER — Other Ambulatory Visit: Payer: Self-pay | Admitting: Family Medicine

## 2013-05-12 DIAGNOSIS — Z0289 Encounter for other administrative examinations: Secondary | ICD-10-CM

## 2013-05-12 DIAGNOSIS — R0989 Other specified symptoms and signs involving the circulatory and respiratory systems: Secondary | ICD-10-CM

## 2013-05-18 ENCOUNTER — Ambulatory Visit (INDEPENDENT_AMBULATORY_CARE_PROVIDER_SITE_OTHER): Payer: Medicare Other | Admitting: Neurology

## 2013-05-18 ENCOUNTER — Ambulatory Visit
Admission: RE | Admit: 2013-05-18 | Discharge: 2013-05-18 | Disposition: A | Discharge status: Home / Self Care | Payer: Medicare Other | Source: Ambulatory Visit | Attending: Family Medicine | Admitting: Family Medicine

## 2013-05-18 ENCOUNTER — Encounter: Payer: Self-pay | Admitting: Neurology

## 2013-05-18 VITALS — BP 109/71 | HR 100 | Ht 63.5 in | Wt 157.0 lb

## 2013-05-18 DIAGNOSIS — G250 Essential tremor: Secondary | ICD-10-CM

## 2013-05-18 DIAGNOSIS — R0989 Other specified symptoms and signs involving the circulatory and respiratory systems: Secondary | ICD-10-CM

## 2013-05-18 DIAGNOSIS — G252 Other specified forms of tremor: Secondary | ICD-10-CM

## 2013-05-18 MED ORDER — TOPIRAMATE 25 MG PO TABS: 25.0000 mg | ORAL_TABLET | Freq: Two times a day (BID) | ORAL | Status: DC

## 2013-05-18 NOTE — Progress Notes (Signed)
[facility] [address] [place]. [place] [address] [phone]       OFFICE CONSULT NOTE  Ms. DESIA SABAN Date of Birth:  09-Aug-1940 Medical Record Number:  [id number]   Referring MD:  Carol Ada  Reason for Referral:  Leg pain  HPI: 72 year pleasant Caucasian lady who's had bilateral lower extremity pain for greater than 10 years. She describes the pain in the feet and present most of the time as a dull ache from ankle down. The pain seems to be increased with  physical activity. At times she describes numbness, tingling or burning sensation. Rarely she has some cramps. She denies restless legs or more trouble at night or difficulty sleeping She has been evaluated for this extensively and has seen rheumatologist Dr. Charlestine Night as well as Levaquin and neurologist Dr. Erling Cruz and had extensive evaluation. She is ready and in a conduction studies done in 2003 and 2011 both of which were normal. Extensive lab evaluation for sarcoidosis, paraneoplastic antibodies, vasculitis, RPR, cryoglobulins have been normal. MRI scans of the spine have shown only mild degenerative changes. She was last seen by Dr. Erling Cruz on 07/24/2009 and details of that visit are listed below. She has tried over the years gabapentin which did not help her. She's currently on Cymbalta which seems to help somewhat. She was briefly tried on Lyrica per Cymbalta but did not tolerate the combination. She has not to try Topamax for neuropathic pain though years ago she had tried that for headaches. She's states that her balance is fine and she does not fall he easily the at times has to catch herself. She denies any significant injury fall back pain rectal pain. She also complains of tremors of her hands for the last 3-4 years at least these are very mild and noticed on his activities like holding a coffee cup or writing. The tremors do not significantly impair her activities of daily living. There is no family is there is  a family strict avoidance of one of her paternal uncles. She denies any cogwheeling, bradykinesia, drooling of saliva.  ROS:   14 system review of systems is positive for neck pain, fatigue, hearing loss, cramps, headache, numbness, hand tremor and all other systems negative  PMH:  Past Medical History  Diagnosis Date  . Hypothyroidism, acquired, autoimmune   . Fibromyalgia syndrome   . Thyroiditis, autoimmune   . Multinodular goiter (nontoxic)   . Tachycardia   . Familial tremor   . Coronary artery spasm   . Dyspepsia   . Fatigue   . Combined hyperlipidemia   . Dupuytren's contracture   . COPD (chronic obstructive pulmonary disease)   . GERD (gastroesophageal reflux disease)   . Hypertension   . Wears glasses   . Arthritis     Social History:  History   Social History  . Marital Status: Married    Spouse Name: edward    Number of Children: 2  . Years of Education: 12th   Occupational History  .     Social History Main Topics  . Smoking status: Former Smoker    Quit date: 02/21/2001  . Smokeless tobacco: Not on file  . Alcohol Use: No  . Drug Use: No  . Sexual Activity: Not on file   Other Topics Concern  . Not on file   Social History Narrative   Patient lives at home with the husband.. Patient drinks coffee    Medications:   Current Outpatient Prescriptions on  File Prior to Visit  Medication Sig Dispense Refill  . DULoxetine (CYMBALTA) 60 MG capsule Take 60 mg by mouth daily.      . enalapril (VASOTEC) 2.5 MG tablet Take 2.5 mg by mouth daily.       Marland Kitchen levothyroxine (SYNTHROID, LEVOTHROID) 112 MCG tablet Take 112 mcg by mouth daily before breakfast.      . Multiple Vitamin (MULTIVITAMIN) tablet Take 1 tablet by mouth daily.        Marland Kitchen omeprazole (PRILOSEC) 20 MG capsule Take 20 mg by mouth daily.      Marland Kitchen tiotropium (SPIRIVA) 18 MCG inhalation capsule Place 18 mcg into inhaler and inhale daily.       No current facility-administered medications on file  prior to visit.    Allergies:   Allergies  Allergen Reactions  . Amoxicillin Hives  . Codeine Nausea And Vomiting  . Lorabid [Loracarbef] Hives  . Sulfa Antibiotics Hives    Physical Exam General: well developed, well nourished, seated, in no evident distress Head: head normocephalic and atraumatic. Orohparynx benign Neck: supple with no carotid or supraclavicular bruits Cardiovascular: regular rate and rhythm, no murmurs Musculoskeletal: no deformity Skin:  no rash/petichiae Vascular:  Normal pulses all extremities Filed Vitals:   05/18/13 1315  BP: 109/71  Pulse: 100    Neurologic Exam Mental Status: Awake and fully alert. Oriented to place and time. Recent and remote memory intact. Attention span, concentration and fund of knowledge appropriate. Mood and affect appropriate.  Cranial Nerves: Fundoscopic exam reveals sharp disc margins. Pupils equal, briskly reactive to light. Extraocular movements full without nystagmus. Visual fields full to confrontation. Hearing intact. Facial sensation intact. Face, tongue, palate moves normally and symmetrically.  Motor: Normal bulk and tone. Normal strength in all tested extremity muscles. Fine action tremor of both outstretched upper extremities right greater than left. No cogwheel rigidity. No bradykinesia. Sensory.: Diminished touch and pin prick sensation in the right foot from mid calf down and left foot from ankle down. Position sense is intact. A Romberg sign is negative. Vibration sense is diminished over toes bilaterally. Coordination: Rapid alternating movements normal in all extremities. Finger-to-nose and heel-to-shin performed accurately bilaterally. Gait and Station: Arises from chair without difficulty. Stance is normal. Gait demonstrates normal stride length and balance . Unable to heel, toe and tandem walk without difficulty. Good postural response to threat Reflexes: 1+ and symmetric including ankle jerks.. Toes downgoing.        ASSESSMENT: 79 year Caucasian lady with chronic bilateral lower extremity pain of undetermined etiology with extensive evaluation in the past being negative. Suspects small fiber peripheral neuropathy. New onset upper extremity fine action tremor of undetermined origin. Anxiety versus essential tremor. Parkinson's disease less likely due to absence of associated features    PLAN: I had a long discussion with the patient regarding her chronic lower extremity pain and new onset of hand tremors, discuss results of my evaluation, assessment plan and answered questions. Trial of Topamax 25 mg at night for one week increase to twice daily if tolerated to help with leg pain and tremors. Check EMG nerve conduction study, vitamin B12, TSH, vitamin D, ANA, ESR and ferritin levels. May consider skin biopsy for small fiber neuropathy if above evaluation is negative. Return for followup in 2 months. This was a prolonged visit needing extensive history taking, review or prior records, imaging studies, labwork as well as medical decision making of high complexity and requiring face-to-face visit and time of 60 minutes  Note: This document was prepared with digital dictation and possible smart phrase technology. Any transcriptional errors that result from this process are unintentional.

## 2013-05-18 NOTE — Patient Instructions (Signed)
I had a long discussion with the patient regarding her chronic lower extremity pain and new onset of hand tremors, discuss results of my evaluation, assessment plan and answered questions. Trial of Topamax 25 mg at night for one week increase to twice daily if tolerated to help with leg pain and tremors. Check EMG nerve conduction study, vitamin B12, TSH, vitamin D, ANA, ESR and ferritin levels. May consider skin biopsy for small fiber neuropathy if above evaluation is negative. Return for followup in 2 months

## 2013-05-19 ENCOUNTER — Telehealth: Payer: Self-pay | Admitting: Neurology

## 2013-05-19 LAB — VITAMIN D 1,25 DIHYDROXY: Vit D, 1,25-Dihydroxy: 40.1 pg/mL (ref 19.9–79.3)

## 2013-05-19 LAB — FERRITIN: Ferritin: 43 ng/mL (ref 15–150)

## 2013-05-19 LAB — SEDIMENTATION RATE: SED RATE: 11 mm/h (ref 0–40)

## 2013-05-19 LAB — VITAMIN B12: Vitamin B-12: 360 pg/mL (ref 211–946)

## 2013-05-19 LAB — TSH: TSH: 0.052 u[IU]/mL — ABNORMAL LOW (ref 0.450–4.500)

## 2013-05-19 LAB — ANA: Anti Nuclear Antibody(ANA): NEGATIVE

## 2013-05-19 NOTE — Telephone Encounter (Signed)
Pt is calling stating that the medication that Dr. Leonie Man prescribed Topiramate is full of sulfur and that sulfur breaks her out in hives. Pt states that she can not take this medicine. Is there anything else that Dr. Leonie Man can prescribe. Please advise

## 2013-05-19 NOTE — Telephone Encounter (Signed)
Shelby Lin check into cross reactivity with sulfa allergy and topamax and advise

## 2013-05-19 NOTE — Telephone Encounter (Signed)
Pt called.  Stated when she went to pick up the new prescription and found out it was full of sulfur. She stated that sulfur gives her hives.  She would like to know if anything else could be called in for her. Please contact the patient to discuss.  Thank you

## 2013-05-20 NOTE — Telephone Encounter (Signed)
Thanks

## 2013-05-20 NOTE — Telephone Encounter (Signed)
I called the pharmacy.  Spoke with Abbe Amsterdam, the pharmacist.  He said Topamax is similar to sulfonamides, however, it is chemically different, and the majority of people who take Topamax have no issues at all.  Says it does not have sulfa itself as a component.  States he explained this to the patient.  I called and spoke with the patient.  She said she did talk with the pharmacist.  She will go ahead and try the medication, but will not start it until Monday.  Advised her to contact us if she has any problems at all.  She is aware if a severe allergic reaction were to occur, she should seek immedicate medical attention.

## 2013-05-25 ENCOUNTER — Encounter: Payer: Medicare Other | Admitting: Diagnostic Neuroimaging

## 2013-05-25 ENCOUNTER — Telehealth: Payer: Self-pay | Admitting: Neurology

## 2013-05-25 NOTE — Telephone Encounter (Signed)
Patient returning call regarding lab results  ?

## 2013-05-25 NOTE — Telephone Encounter (Signed)
Called pt to inform her per Dr. Leonie Man that the pt needed to refer to MD to adjust pt's thyroid medication and that everything else look normal. I advised the pt that if she has any other problems, questions or concerns to call the office. Pt verbalized understanding.

## 2013-06-01 ENCOUNTER — Encounter (INDEPENDENT_AMBULATORY_CARE_PROVIDER_SITE_OTHER): Payer: Medicare Other | Admitting: Neurology

## 2013-06-14 ENCOUNTER — Ambulatory Visit (INDEPENDENT_AMBULATORY_CARE_PROVIDER_SITE_OTHER): Payer: Medicare Other | Admitting: Diagnostic Neuroimaging

## 2013-06-14 ENCOUNTER — Encounter (INDEPENDENT_AMBULATORY_CARE_PROVIDER_SITE_OTHER): Payer: Self-pay

## 2013-06-14 DIAGNOSIS — Z0289 Encounter for other administrative examinations: Secondary | ICD-10-CM

## 2013-06-14 DIAGNOSIS — R200 Anesthesia of skin: Secondary | ICD-10-CM

## 2013-06-14 DIAGNOSIS — R209 Unspecified disturbances of skin sensation: Secondary | ICD-10-CM

## 2013-06-14 NOTE — Procedures (Signed)
   [facility]  NCS (NERVE CONDUCTION STUDY) WITH EMG (ELECTROMYOGRAPHY) REPORT   STUDY DATE: 06/14/13 PATIENT NAME: Shelby Lin DOB: 09-27-1940 MRN: [id number]  ORDERING CLINICIAN: Antony Contras, MD   TECHNOLOGIST: Laretta Alstrom ELECTROMYOGRAPHER: Earlean Polka. Tristian Sickinger, MD  CLINICAL INFORMATION: 73 year old female with numbness and pain in the feet.  FINDINGS: NERVE CONDUCTION STUDY: Bilateral peroneal and bilateral tibial motor responses and F-wave latencies are normal. Bilateral H reflex responses are normal.  NEEDLE ELECTROMYOGRAPHY: Bilateral peroneal sensory responses are normal.   IMPRESSION:  Normal study. No evidence of large fiber neuropathy at this time.   INTERPRETING PHYSICIAN:  Penni Bombard, MD Certified in Neurology, Neurophysiology and Neuroimaging  [facility] [address], [place], [place] [address] [phone]

## 2013-06-24 ENCOUNTER — Other Ambulatory Visit: Payer: Self-pay | Admitting: "Endocrinology

## 2013-07-29 ENCOUNTER — Encounter: Payer: Self-pay | Admitting: Neurology

## 2013-07-29 ENCOUNTER — Telehealth: Payer: Self-pay | Admitting: Neurology

## 2013-07-29 NOTE — Telephone Encounter (Signed)
Spoke to patient's husband about rescheduling 08/24/13 appointment per Dr. Clydene Fake schedule. Printed and mailed letter with new appointment time.

## 2013-08-24 ENCOUNTER — Ambulatory Visit: Payer: Medicare Other | Admitting: Neurology

## 2013-08-31 ENCOUNTER — Ambulatory Visit: Payer: Medicare Other | Admitting: Neurology

## 2013-09-20 ENCOUNTER — Other Ambulatory Visit: Payer: Self-pay | Admitting: Gastroenterology

## 2013-10-05 ENCOUNTER — Telehealth: Payer: Self-pay | Admitting: Neurology

## 2013-10-05 NOTE — Telephone Encounter (Signed)
Patient requesting an appointment soon with Dr. Leonie Man, states that she has numbness in her legs and arm again. Please return call to patient and advise.

## 2013-10-06 NOTE — Telephone Encounter (Signed)
Called patient and scheduled a appointment with dr. Leonie Man for the 21 @ 9am

## 2013-10-14 ENCOUNTER — Ambulatory Visit (INDEPENDENT_AMBULATORY_CARE_PROVIDER_SITE_OTHER): Payer: Medicare Other | Admitting: Neurology

## 2013-10-14 ENCOUNTER — Encounter: Payer: Self-pay | Admitting: Neurology

## 2013-10-14 VITALS — BP 111/68 | HR 89 | Ht 63.5 in | Wt 154.4 lb

## 2013-10-14 DIAGNOSIS — M792 Neuralgia and neuritis, unspecified: Secondary | ICD-10-CM

## 2013-10-14 DIAGNOSIS — IMO0002 Reserved for concepts with insufficient information to code with codable children: Secondary | ICD-10-CM

## 2013-10-14 MED ORDER — CARBAMAZEPINE 100 MG PO CHEW: 100.0000 mg | CHEWABLE_TABLET | Freq: Two times a day (BID) | ORAL | Status: DC

## 2013-10-14 NOTE — Progress Notes (Signed)
[facility] [address] [place]. [place] [address] [phone]       OFFICE CONSULT NOTE  [name] Date of Birth:  Jan 06, 1941 Medical Record Number:  [id number]   Referring MD:  Shelby Lin  Reason for Referral:  Leg pain  HPI: 89 year pleasant Caucasian lady who's had bilateral lower extremity pain for greater than 10 years. She describes the pain in the feet and present most of the time as a dull ache from ankle down. The pain seems to be increased with  physical activity. At times she describes numbness, tingling or burning sensation. Rarely she has some cramps. She denies restless legs or more trouble at Lin or difficulty sleeping She has been evaluated for this extensively and has seen rheumatologist Dr. Charlestine Lin as well as Levaquin and neurologist Dr. Erling Lin and had extensive evaluation. She is ready and in a conduction studies done in 2003 and 2011 both of which were normal. Extensive lab evaluation for sarcoidosis, paraneoplastic antibodies, vasculitis, RPR, cryoglobulins have been normal. MRI scans of the spine have shown only mild degenerative changes. She was last seen by Dr. Erling Lin on 07/24/2009 and details of that visit are listed below. She has tried over the years gabapentin which did not help her. She's currently on Cymbalta which seems to help somewhat. She was briefly tried on Lyrica per Cymbalta but did not tolerate the combination. She has not to try Topamax for neuropathic pain though years ago she had tried that for headaches. She's states that her balance is fine and she does not fall he easily the at times has to catch herself. She denies any significant injury fall back pain rectal pain. She also complains of tremors of her hands for the last 3-4 years at least these are very mild and noticed on his activities like holding a coffee cup or writing. The tremors do not significantly impair her activities of daily living. There is no family is there is  a family strict avoidance of one of her paternal uncles. She denies any cogwheeling, bradykinesia, drooling of saliva. Update 10/14/13 : She returns for followup of the last nearly 5 months ago. She missed followup appointment once and had to reschedule appointment once. She has had a flareup of her colitis with bloody diarrhea. She had a colonoscopy done by Shelby Lin. She also had to have her toenails removed due to trauma which she suffered while visiting [place] a few months ago. She did not tolerate the Topamax which I started her on and it upset her stomach and she stopped it. Her primary physician and change her to Neurontin which also produced very diarrhea and she stopped it. In the past she has tried amitriptyline but got nightmares from it. She did not tolerate Lyrica and if past as well and it made her feel like a zombie. She had EMG nerve conduction study done on 06/14/48 meropenem orally which showed no evidence of large fiber neuropathy and was normal. Lab work on 05/18/13 showed normal vitamin D, B12, ESR and ANA panel. TSH was suppressed at 0.052. Patient informs minutes he subsequently had repeat vitamin D. and B12 levels a month ago by her primary physician and they were both low and she has been started on B12 and vitamin D replacement since then. She also complains of chronic mild headache she has had for a while. She describes this as a pressure-like sensation on the top of her head. She takes Tylenol almost daily which  works quite well for her. ROS:   14 system review of systems is positive for foot pain, tingling, numbness, burning, headache, walking difficulty, back pain and  all other systems negative  PMH:  Past Medical History  Diagnosis Date  . Hypothyroidism, acquired, autoimmune   . Fibromyalgia syndrome   . Thyroiditis, autoimmune   . Multinodular goiter (nontoxic)   . Tachycardia   . Familial tremor   . Coronary artery spasm   . Dyspepsia   . Fatigue   . Combined  hyperlipidemia   . Dupuytren's contracture   . COPD (chronic obstructive pulmonary disease)   . GERD (gastroesophageal reflux disease)   . Hypertension   . Wears glasses   . Arthritis     Social History:  History   Social History  . Marital Status: Married    Spouse Name: edward    Number of Children: 2  . Years of Education: 12th   Occupational History  .     Social History Main Topics  . Smoking status: Former Smoker    Quit date: 02/21/2001  . Smokeless tobacco: Not on file  . Alcohol Use: No  . Drug Use: No  . Sexual Activity: Not on file   Other Topics Concern  . Not on file   Social History Narrative   Patient lives at home with the husband.. Patient drinks coffee    Medications:   Current Outpatient Prescriptions on File Prior to Visit  Medication Sig Dispense Refill  . DULoxetine (CYMBALTA) 60 MG capsule Take 60 mg by mouth daily.      . enalapril (VASOTEC) 2.5 MG tablet Take 2.5 mg by mouth daily.       Marland Kitchen levothyroxine (SYNTHROID, LEVOTHROID) 112 MCG tablet Take 112 mcg by mouth daily before breakfast.      . Multiple Vitamin (MULTIVITAMIN) tablet Take 1 tablet by mouth daily.        Marland Kitchen omeprazole (PRILOSEC) 20 MG capsule Take 20 mg by mouth daily.      Marland Kitchen tiotropium (SPIRIVA) 18 MCG inhalation capsule Place 18 mcg into inhaler and inhale daily.       No current facility-administered medications on file prior to visit.    Allergies:   Allergies  Allergen Reactions  . Amoxicillin Hives  . Codeine Nausea And Vomiting  . Lorabid [Loracarbef] Hives  . Sulfa Antibiotics Hives    Physical Exam General: well developed, well nourished, seated, in no evident distress Head: head normocephalic and atraumatic. Orohparynx benign Neck: supple with no carotid or supraclavicular bruits Cardiovascular: regular rate and rhythm, no murmurs Musculoskeletal: no deformity Skin:  no rash/petichiae Vascular:  Normal pulses all extremities Filed Vitals:   10/14/13  0847  BP: 111/68  Pulse: 89    Neurologic Exam Mental Status: Awake and fully alert. Oriented to place and time. Recent and remote memory intact. Attention span, concentration and fund of knowledge appropriate. Mood and affect appropriate.  Cranial Nerves: Fundoscopic exam reveals sharp disc margins. Pupils equal, briskly reactive to light. Extraocular movements full without nystagmus. Visual fields full to confrontation. Hearing intact. Facial sensation intact. Face, tongue, palate moves normally and symmetrically.  Motor: Normal bulk and tone. Normal strength in all tested extremity muscles. Fine action tremor of both outstretched upper extremities right greater than left. No cogwheel rigidity. No bradykinesia. Sensory.: Diminished touch and pin prick sensation in the right foot from mid calf down and left foot from ankle down. Position sense is intact. A Romberg sign is negative.  Vibration sense is diminished over toes bilaterally. Coordination: Rapid alternating movements normal in all extremities. Finger-to-nose and heel-to-shin performed accurately bilaterally. Gait and Station: Arises from chair without difficulty. Stance is normal. Gait demonstrates normal stride length and balance . Unable to heel, toe and tandem walk without difficulty. Good postural response to threat Reflexes: 1+ and symmetric including ankle jerks.. Toes downgoing.      ASSESSMENT: 31 year Caucasian lady with chronic bilateral lower extremity  Neuropathic pain  Likely from small fiber peripheral neuropathy perhaps related to her autoimmune colitis. Chronic headaches with a likely muscle tension headaches   PLAN: I had a long discussion with the patient with regards to her neuropathic pain symptoms, reviewed results of diagnostic lab studies as well as EMG nerve conduction studies. I discussed the possible utility of doing a skin biopsy but she is declining this at the current time. She has tried and failed several  different medications for neuropathic pain. Try Tegretol 100 mg twice daily for one week if tolerated increase to 3 times daily. I discussed possible side effects the patient and the need for checking periodic CBCs in the future if she stays on this medicine. I also advised her to follow up with her endocrinologist to adjust the dose of Synthroid given her hyperthyroidism. I also advised her to participate in stress relaxation activities like regular exercise, meditation and yoga to help with her tension headaches. Return for followup in 2 months with Charlott Holler, NP or call  earlier if necessary  Note: This document was prepared with digital dictation and possible smart phrase technology. Any transcriptional errors that result from this process are unintentional.

## 2013-10-14 NOTE — Patient Instructions (Signed)
I had a long discussion with the patient with regards to her neuropathic pain symptoms, reviewed results of diagnostic lab studies as well as EMG nerve conduction studies. I discussed the possible utility of doing a skin biopsy but she is declining this at the current time. She has tried and failed several different medications for neuropathic pain. Try Tegretol 100 mg twice daily for one week if tolerated increase to 3 times daily. I discussed possible side effects the patient and the need for checking periodic CBCs in the future if she stays on this medicine. I also advised her to follow up with her endocrinologist to adjust the dose of Synthroid given her hyperthyroidism. I also advised her to participate in stress relaxation activities like regular exercise, meditation and yoga to help with her tension headaches. Return for followup in 2 months with Charlott Holler, NP or call  earlier if necessary

## 2013-10-18 ENCOUNTER — Other Ambulatory Visit: Payer: Self-pay | Admitting: *Deleted

## 2013-10-18 DIAGNOSIS — E038 Other specified hypothyroidism: Secondary | ICD-10-CM

## 2013-10-27 LAB — T3, FREE: T3, Free: 2.7 pg/mL (ref 2.3–4.2)

## 2013-10-27 LAB — TSH: TSH: 1.087 u[IU]/mL (ref 0.350–4.500)

## 2013-10-27 LAB — CALCIUM: Calcium: 9.6 mg/dL (ref 8.4–10.5)

## 2013-10-27 LAB — T4, FREE: FREE T4: 1.04 ng/dL (ref 0.80–1.80)

## 2013-10-28 LAB — VITAMIN D 25 HYDROXY (VIT D DEFICIENCY, FRACTURES): Vit D, 25-Hydroxy: 34 ng/mL (ref 30–89)

## 2013-11-01 LAB — PTH, INTACT AND CALCIUM
CALCIUM: 9.6 mg/dL (ref 8.4–10.5)
PTH: 32 pg/mL (ref 14–64)

## 2013-11-02 ENCOUNTER — Ambulatory Visit (INDEPENDENT_AMBULATORY_CARE_PROVIDER_SITE_OTHER): Payer: Medicare Other | Admitting: "Endocrinology

## 2013-11-02 ENCOUNTER — Other Ambulatory Visit: Payer: Self-pay | Admitting: *Deleted

## 2013-11-02 ENCOUNTER — Ambulatory Visit: Payer: Medicare Other | Admitting: "Endocrinology

## 2013-11-02 ENCOUNTER — Encounter: Payer: Self-pay | Admitting: "Endocrinology

## 2013-11-02 VITALS — BP 117/70 | HR 95 | Wt 154.0 lb

## 2013-11-02 DIAGNOSIS — E063 Autoimmune thyroiditis: Secondary | ICD-10-CM

## 2013-11-02 DIAGNOSIS — M797 Fibromyalgia: Secondary | ICD-10-CM

## 2013-11-02 DIAGNOSIS — I10 Essential (primary) hypertension: Secondary | ICD-10-CM

## 2013-11-02 DIAGNOSIS — R1013 Epigastric pain: Secondary | ICD-10-CM

## 2013-11-02 DIAGNOSIS — E049 Nontoxic goiter, unspecified: Secondary | ICD-10-CM

## 2013-11-02 DIAGNOSIS — F329 Major depressive disorder, single episode, unspecified: Secondary | ICD-10-CM

## 2013-11-02 DIAGNOSIS — R490 Dysphonia: Secondary | ICD-10-CM

## 2013-11-02 DIAGNOSIS — F32A Depression, unspecified: Secondary | ICD-10-CM

## 2013-11-02 DIAGNOSIS — R61 Generalized hyperhidrosis: Secondary | ICD-10-CM

## 2013-11-02 DIAGNOSIS — K3189 Other diseases of stomach and duodenum: Secondary | ICD-10-CM

## 2013-11-02 DIAGNOSIS — E038 Other specified hypothyroidism: Secondary | ICD-10-CM

## 2013-11-02 DIAGNOSIS — H8103 Meniere's disease, bilateral: Secondary | ICD-10-CM

## 2013-11-02 DIAGNOSIS — F3289 Other specified depressive episodes: Secondary | ICD-10-CM

## 2013-11-02 DIAGNOSIS — IMO0001 Reserved for inherently not codable concepts without codable children: Secondary | ICD-10-CM

## 2013-11-02 DIAGNOSIS — G44229 Chronic tension-type headache, not intractable: Secondary | ICD-10-CM

## 2013-11-02 DIAGNOSIS — H8109 Meniere's disease, unspecified ear: Secondary | ICD-10-CM

## 2013-11-02 MED ORDER — LEVOTHYROXINE SODIUM 112 MCG PO TABS: ORAL_TABLET | ORAL | Status: DC

## 2013-11-02 NOTE — Patient Instructions (Signed)
Follow up visit in 6 months. Please repeat blood tests 1-2 weeks prior to your next appointment.

## 2013-11-02 NOTE — Progress Notes (Signed)
Subjective:  Patient Name: Shelby Lin Date of Birth: 1941-01-18  MRN: [id number]  Shelby Lin  presents to the office today for follow-up of her hypothyroidism secondary to Hashimoto's disease, questionable multinodular goiter, tachycardia, tremor, dyspepsia, fatigue, fibromyalgia, hyperlipidemia, night sweats, and tremor.  HISTORY OF PRESENT ILLNESS:   Shelby Lin is a 73 y.o. Caucasian woman.  [name] was unaccompanied.  1. Shelby Lin was referred to me on 03/25/2005 by her primary care provider, Dr. Carol Ada, for evaluation of her thyroid problems. Shelby Lin was 38 at that time.   A. At about age 4 the patient developed thyroid problems and was put on a thyroid medicine. Later when she became pregnant her obstetrician took her off that medicine. At about age 14, she developed severe fatigue and weight gain. She was put back on Synthroid. Her dose of Synthroid was 88 mcg per day for many years. In January 2006, she had fluctuations in her thyroid function tests. Synthroid was increased to 100 mcg and later to 125 mcg per day. The Synthroid dose was then reduced to 75 mcg. After blood tests about 9 months prior to her first appointment with me, the patient was put back on a dose of 88 mcg per day. When the patient was having fluctuations in her thyroid tests, her thyroid gland became visibly enlarged and felt full and tender to palpation. She was also more hoarse during that period. Hoarseness comes and goes in parallel with the thyroid gland swelling. Ultrasound studies of the thyroid in January 2006 and January 2007 showed multinodular goiter.   B. Thyroid function tests performed at that first visit showed a TSH of 4.139, free T4 1.22, and free T3 of 2.7. Because any TSH at that time greater than 3 was really high, I increased her Synthroid from 88 to 100 mcg per day. TPO antibody was markedly positive at 954.0, c/w the diagnosis of Hashimoto's thyroiditis.Marland Kitchen TSI was quite normal at 0.9.   2. During  the last 8 years the patient has had several flare-ups of Hashimoto's disease, resulting in several changes in her doses of Synthroid. In February 2011 we had to increase her Synthroid dose to 125 mcg per day. Later that year we reduced the dose to 125 mcg 5 days per week and later to 88 mcg/day 6 days per week. In March 2015 I changed her Synthroid dose to 112 mcg 5 days per week and 168 mg (1.5 of the 112 mg tablets) 2 days per week.   3. The patient's last [facility] visit was on 05/02/13. In the interim, she has again been through a lot.   A. In May she had to have both great toenails removed due to trauma from walking at [facility].   B.  About 6 weeks ago she had bloody stools, cramp, and pain which were due to a recurrence of colitis. She was treated with several medications, which eventually caused the colitis to resolve. She had a colonoscopy in the process, which showed the colitis.  She was then treated with a regimen of decreasing doses of prednisone over several weeks. Unfortunately, she had a recurrence of bleeding and pain yesterday, so prednisone was re-started.   C. Her neurologist feels that part of her fibromyalgia symptoms may be due to neuropathy. She did not tolerate Tramadol due to fatigue and malaise.   D. Her feet still bother her a lot. Her feet burn in the forefoot area a lot. Steroid injections did not help. The podiatrist wants  to do surgery on her right bunion and right hammer toe. Shelby Lin does not want to have surgery now.   E. Her Meniere's disease of the left ear occasionally acts up, but she has not had any significant vertigo in months. Her ENT doctor had advised doing surgery on her left mastoid area, but she has declined. The hearing in the left ear varies. Her sensation of head fullness continues intermittently, especially on the left side. She has not had to take meclizine recently. She is on a low salt diet to reduce edema within the left ear.   F. Her COPD is fine as long  as she uses her spirometer daily.    G. She continues to have fronto-temporal headaches, occasionally involving the nuchal area and trapezius areas. Her trapezius areas remain tight and tender, but are better. The cervical stretching exercises help when she does them.   H. She remains on Synthroid, 112 mcg, one tablet 5 days per week and 1.5 tablets two days per week. She also takes omeprazole and enalapril.   I. She has sweats during the day and at night since stopping estrogen.Her night sweats have been better.    J. The wens on her left wrist disappeared after steroid injections.    K. She is intermittently, chronically hoarse. The hoarseness comes and goes. She has a lot of postnasal drip and some cough. She is not sure if her allergies affect the hoarseness or not.   L. Her depression is "fair". When she has medical problems the depression sometimes gets worse.   4. Pertinent Review of Systems:  Constitutional: "I feel okay. I never feel good any more." Her stamina and energy vary from pretty good to not so good. When she feels good, she "burns the candle at both ends and then pays for it the next day."   Eyes: Vision is good long as she wears her glasses. She had an eye exam about 18 months ago. There are no significant eye complaints.  Neck: The thyroid gland has not seemed to swell lately. The patient has no complaints of anterior neck soreness, tenderness,  pressure, discomfort, or difficulty swallowing.  Heart: She has not had any chest symptoms. Heart rate increases with exercise or other physical activity. The patient has no other complaints of palpitations, irregular heat beats, chest pain, or chest pressure. Gastrointestinal: As above. Her acid reflux and dyspepsia are pretty well controlled by her omeprazole. She still eats some [place] food, but not as frequently.    Legs: Muscle mass and strength seem normal. Legs were stronger when she was walking more. The episodic leg numbness and  burning only bother her occasionally. She has not had any edema recently.   Feet: As above. Her episodic foot pains are about the same.   GYN: She is still off Premarin. Emotional/psychiatric: She feels "pretty good" on most days.   Mental: She does "fine" at thinking, paying attention, remembering, and making decisions. "I have all my marbles."   PAST MEDICAL, FAMILY, AND SOCIAL HISTORY:  Past Medical History  Diagnosis Date  . Hypothyroidism, acquired, autoimmune   . Fibromyalgia syndrome   . Thyroiditis, autoimmune   . Multinodular goiter (nontoxic)   . Tachycardia   . Familial tremor   . Coronary artery spasm   . Dyspepsia   . Fatigue   . Combined hyperlipidemia   . Dupuytren's contracture   . COPD (chronic obstructive pulmonary disease)   . GERD (gastroesophageal reflux disease)   .  Hypertension   . Wears glasses   . Arthritis     Family History  Problem Relation Age of Onset  . Cancer Mother   . Thyroid disease Neg Hx   . Hypertension Mother   . Hypertension Sister   . Diabetes Sister   . Heart attack Father   . Sudden death Father   . Stroke Mother   . Stroke Sister     Current outpatient prescriptions:Cyanocobalamin (VITAMIN B 12 PO), Take by mouth daily., Disp: , Rfl: ;  DULoxetine (CYMBALTA) 60 MG capsule, Take 60 mg by mouth daily., Disp: , Rfl: ;  enalapril (VASOTEC) 2.5 MG tablet, Take 2.5 mg by mouth daily. , Disp: , Rfl: ;  levothyroxine (SYNTHROID, LEVOTHROID) 112 MCG tablet, Take 112 mcg by mouth daily before breakfast., Disp: , Rfl:  mesalamine (LIALDA) 1.2 G EC tablet, Take by mouth daily with breakfast. Two pills daily., Disp: , Rfl: ;  Multiple Vitamin (MULTIVITAMIN) tablet, Take 1 tablet by mouth daily.  , Disp: , Rfl: ;  omeprazole (PRILOSEC) 20 MG capsule, Take 20 mg by mouth daily., Disp: , Rfl: ;  tiotropium (SPIRIVA) 18 MCG inhalation capsule, Place 18 mcg into inhaler and inhale daily., Disp: , Rfl: ;  VITAMIN D, CHOLECALCIFEROL, PO, Take by mouth  daily., Disp: , Rfl:  carbamazepine (TEGRETOL) 100 MG chewable tablet, Chew 1 tablet (100 mg total) by mouth 2 (two) times daily. After 1 week increase to three times daily if tolerated, Disp: 60 tablet, Rfl: 0;  predniSONE (DELTASONE) 2.5 MG tablet, Take 2.5 mg by mouth daily with breakfast., Disp: , Rfl:   Allergies as of 11/02/2013 - Review Complete 11/02/2013  Allergen Reaction Noted  . Amoxicillin Hives 06/18/2010  . Codeine Nausea And Vomiting 02/21/2013  . Lorabid [loracarbef] Hives 06/18/2010  . Sulfa antibiotics Hives 06/18/2010    1. Work and Family: She retired in June 2013. She stays fairly busy, to include a part-time job.  2. Activities: Physical activities are somewhat restricted due to foot pain and fatigue. She has not been walking much lately. 3. Smoking, alcohol, or drugs: None 4. Primary Care Provider: Dr. Carol Ada, [facility] [place]: There are no other significant problems involving Jailyn's other body systems.   Objective:  Vital Signs:  BP 117/70  Pulse 95  Wt 154 lb (69.854 kg) She has lost 3 lbs since last visit.   PHYSICAL EXAMINATION  Constitutional: The patient looks quite good today. She is surprisingly alert, bright, upbeat, and perky.   Eyes: There is no obvious arcus or proptosis. Moisture appears normal. Mouth: The oropharynx and tongue appear normal. Oral moisture is normal. Neck: The neck appears to be visibly normal. No carotid bruits are noted. The thyroid gland is slightly larger, but still within normal limits, at about  at about 20 grams. The consistency of the thyroid gland is normal. The thyroid gland is not tender to palpation. Her trapezius girdle is still somewhat tight and somewhat tender. Her cervical range of motion is probably more  restricted today than at last visit. Lungs: The lungs are clear to auscultation. Air movement is good. Heart: Heart rate and rhythm are regular. Heart sounds S1 and S2  are normal. I did not appreciate any pathologic cardiac murmurs. Abdomen: The abdomen is somewhat enlarged. Bowel sounds are normal. There is no obvious hepatomegaly, splenomegaly, or other mass effect. The abdomen is not tender to palpation. Arms: Muscle size and bulk are normal for  age. Hands: There is 1+ tremor. Phalangeal and metacarpophalangeal joints are normal. Palmar muscles are low-normal. Palmar moisture is also normal. Legs: Muscles appear normal for age. No edema is present. She has pain to palpation in the distal shins. Neurologic: Strength is normal for age in both the upper and lower extremities. Muscle tone is normal. Sensation to touch is normal in the legs.   LAB DATA:   10/27/13: TSH 1.087, free T4 1.04, free T3 2.7; Calcium 9.6, PTH 32, 25-hydroxyvitamin D 34  05/18/13: TSH 0.052, free T4 1.24, free T3 3.2  04/28/13: TSH 0.426, free T4 1.24, free T3 3.2; calcium 9.7, 25-hydroxy vitamin D 36  10/08/12: TSH 2.106, free T4 1.05, free T3 2.6; 25-OH vitamin D 37, PTH 39.4, calcium 9.1  04/02/12: TSH 1.073, free T4 1.31, free T3 2.9, calcium 9.5, 25-hydroxy vitamin D 31 - PTH was ordered but not done. I have re-ordered it this morning.  09/30/11: TSH 8.095, free T4 1.16, free T3 2.4. PTH 24, calcium 9.3, 25-hydroxy vitamin D 33, 1,25-dihydroxy vitamin D 95 11/21/10: Calcium was 9.5. Her PTH was 37.6. 25-hydroxy vitamin D was 41. 1, 25-dihydroxy vitamin D was 85, which was slightly elevated. B12 was 352 ([phone])    10/12/12: US thyroid gland: Small 10 x 4 x 5 mm nodule or adjacent lymph node or parathyroid adenoma. Diffuse echogenicity. As I read the [place], there is definitely a lot of echogenicity c/w Hashimoto's thyroiditis. There may be a small nodule that is actually a bit less than one cm in longest dimension. This is unchanged since June 2011.   Assessment and Plan:   ASSESSMENT:  1. Hypothyroid: She was euthyroid in February and in August 2014, but was borderline hyperthyroid at  last visit in March. I consequently reduced her Synthroid dose. However, on 05/18/13 she was frankly hyperthyroid. Since then, while remaining on the same Synthroid dose, her TFTS have normalized and are now at the junction of the upper and middle thirds of the normal range. It appears that she had another flare up of Hashimoto's disease in March, causing Hashitoxicosis. We will continue to follow her TFTs serially and adjust Synthroid doses as needed.  2. Thyroiditis: The patient's Hashimoto's disease has been clinically quiescent. We'll see if she loses more thyroid cells in the next 2-3 years. 3. Headaches: Headaches appear to be "tension" headaches. They have worsened in the past several months. She needs to resume her cervical stretching exercises twice a day.   4. Multinodular goiter: Thyroid gland is again within normal limits for size today, but is a bit larger. The waxing and waning of thyroid gland size is c/w evolving Hashimoto's disease. Although some [place] studies have shown a nodule, her [place] study in June 2011 showed either a questionable nodule or demonstrated the echo texture heterogeneity that is commonly seen with Hashimoto's disease. Her [place] in August 2014 was essentially unchanged.  5. Night sweats: The night sweats are more frequent and now occur during the day since stopping estrogen therapy. If the sweats worsen, she may need to resume estrogen therapy. 6. Leg pains: Calcium is normal, just above the 50%. Her PTH value is mid-range normal. Her vitamin D is normal, but low-normal. She is still taking her Caltrate-D once a day. She may need to take the Caltrate-D twice a day 2-3 days per week or increase dairy consumption. She says that dairy aggravates her dyspepsia.  7. Meniere's disease: Her vertigo has essentially resolved. The left ear Meniere's disease is  still an issue for her. She may require surgery. Her hearing in the left ear varies. Her sensation of head fullness occurs  intermittently. It is likely that her environmental allergies are responsible for some serous fluid buildup in the ear.  8. Hoarseness: The fact that her hoarseness and post nasal drip come and go essentially in parallel indicate that the hoarseness is likely due to flare ups of her allergies. However, severe flare ups of Hashimoto's disease have also caused hoarseness in the past. 9. Dyspepsia: This problem is controlled with omeprazole. 10. Chest pressure with radiation to her jaws: This problem has resolved. Her cardiac cath was clean. Therefore, her symptoms were most likely due to esophageal spasm.  11. Hypertension: She is doing well with enalapril.  12. Depression: She looks great today.   13. Fibromyalgia syndrome: This is her major clinical problem. Exercise will help her "re-charge her batteries".  PLAN:  1. Diagnostic: Repeat TFTs and calcium/ PTH, and 25-hydroxy vitamin D levels 2 weeks before the next visit. 2. Therapeutic: Do her best to continue daily physical activity. Continue Synthroid at 112 mcg 5 days per week and 1.5 tabs on Wednesday and Sunday.  3. Patient education: We discussed the pathophysiologies of headaches, Hashimoto's disease, and Meniere's disease.  4. Follow-up: 6 months  Level of Service: This visit lasted in excess of 45 minutes. More than 50% of the visit was devoted to counseling.  Sherrlyn Hock

## 2013-11-12 ENCOUNTER — Other Ambulatory Visit: Payer: Self-pay | Admitting: "Endocrinology

## 2013-12-20 ENCOUNTER — Ambulatory Visit: Payer: Medicare Other | Admitting: Nurse Practitioner

## 2014-01-03 ENCOUNTER — Other Ambulatory Visit: Payer: Self-pay | Admitting: "Endocrinology

## 2014-02-02 ENCOUNTER — Encounter (HOSPITAL_COMMUNITY): Payer: Self-pay | Admitting: Cardiology

## 2014-04-15 ENCOUNTER — Other Ambulatory Visit: Payer: Self-pay | Admitting: "Endocrinology

## 2014-04-28 LAB — CALCIUM: Calcium: 9.4 mg/dL (ref 8.4–10.5)

## 2014-04-29 LAB — T4, FREE: FREE T4: 1.47 ng/dL (ref 0.80–1.80)

## 2014-04-29 LAB — T3, FREE: T3 FREE: 3.3 pg/mL (ref 2.3–4.2)

## 2014-04-29 LAB — VITAMIN D 25 HYDROXY (VIT D DEFICIENCY, FRACTURES): Vit D, 25-Hydroxy: 23 ng/mL — ABNORMAL LOW (ref 30–100)

## 2014-04-29 LAB — TSH: TSH: 0.081 u[IU]/mL — AB (ref 0.350–4.500)

## 2014-05-01 LAB — PTH, INTACT AND CALCIUM
CALCIUM: 9.4 mg/dL (ref 8.4–10.5)
PTH: 26 pg/mL (ref 14–64)

## 2014-05-03 ENCOUNTER — Ambulatory Visit: Payer: Self-pay | Admitting: Pediatrics

## 2014-05-03 ENCOUNTER — Ambulatory Visit: Payer: Medicare Other | Admitting: "Endocrinology

## 2014-05-04 ENCOUNTER — Ambulatory Visit: Payer: Self-pay | Admitting: "Endocrinology

## 2014-05-09 ENCOUNTER — Telehealth: Payer: Self-pay | Admitting: *Deleted

## 2014-05-09 ENCOUNTER — Other Ambulatory Visit: Payer: Self-pay | Admitting: *Deleted

## 2014-05-09 NOTE — Telephone Encounter (Signed)
Spoke to patient, advised that per [name] PTH and calcium are normal. 25-hydroxy vitamin D is somewhat low again. If she is not taking her vitamin D regularly, she needs to do so. She is unusually hyperthyroid. Please ask her to call me tomorrow evening between 8-10 PM so we can talk. In the interim, please reduce her Synthroid dose to 112 mcg/day every day. She advises that she would call tonight.

## 2014-05-11 ENCOUNTER — Telehealth: Payer: Self-pay | Admitting: "Endocrinology

## 2014-05-11 NOTE — Telephone Encounter (Signed)
Routed to provider

## 2014-05-12 ENCOUNTER — Telehealth: Payer: Self-pay | Admitting: "Endocrinology

## 2014-05-12 NOTE — Telephone Encounter (Signed)
1 Shelby Lin called yesterday requesting that I call her. Because I was working until 1:30 this AM I was not able to return her call until today.  2. When I called her home she was not available. I talked with her husband. He was not aware of her having any new or urgent medical issues. I asked him to let his wife know that if she needs to contact me over the weekend to call the office number between 8:00-9:30 PM. Otherwise we can try to get together by phone next week. The husband said that he would pass along the message to his wife.  Shelby Lin

## 2014-05-17 NOTE — Telephone Encounter (Signed)
Handled by provider.Emily M Hull °

## 2014-05-25 ENCOUNTER — Telehealth: Payer: Self-pay | Admitting: *Deleted

## 2014-05-25 NOTE — Telephone Encounter (Signed)
Spoke with patient moved appt from 4/27 to 4/22

## 2014-06-16 ENCOUNTER — Encounter: Payer: Self-pay | Admitting: "Endocrinology

## 2014-06-16 ENCOUNTER — Ambulatory Visit (INDEPENDENT_AMBULATORY_CARE_PROVIDER_SITE_OTHER): Payer: Medicare Other | Admitting: "Endocrinology

## 2014-06-16 VITALS — BP 122/78 | HR 94 | Wt 150.8 lb

## 2014-06-16 DIAGNOSIS — E038 Other specified hypothyroidism: Secondary | ICD-10-CM

## 2014-06-16 DIAGNOSIS — M79606 Pain in leg, unspecified: Secondary | ICD-10-CM

## 2014-06-16 DIAGNOSIS — E049 Nontoxic goiter, unspecified: Secondary | ICD-10-CM | POA: Diagnosis not present

## 2014-06-16 DIAGNOSIS — E063 Autoimmune thyroiditis: Secondary | ICD-10-CM

## 2014-06-16 DIAGNOSIS — G8929 Other chronic pain: Secondary | ICD-10-CM

## 2014-06-16 DIAGNOSIS — R1013 Epigastric pain: Secondary | ICD-10-CM

## 2014-06-16 NOTE — Progress Notes (Signed)
Subjective:  Patient Name: Shelby Lin Date of Birth: 1941/02/01  MRN: [id number]  Shelby Lin  presents to the office today for follow-up of her hypothyroidism secondary to Hashimoto's disease, questionable multinodular goiter, tachycardia, tremor, dyspepsia, fatigue, fibromyalgia, hyperlipidemia, night sweats, and tremor.  HISTORY OF PRESENT ILLNESS:   Shelby Lin is a 74 y.o. Caucasian woman.  Shelby Lin was unaccompanied.  1. [name] was referred to me on 03/25/2005 by her primary care provider, Dr. Carol Ada, for evaluation of her thyroid problems. [name] was 51 at that time.   A. At about age 74 the patient developed thyroid problems and was put on a thyroid medicine. Later when she became pregnant her obstetrician took her off that medicine. At about age 3, she developed severe fatigue and weight gain. She was put back on Synthroid. Her dose of Synthroid was 88 mcg per day for many years. In January 2006, she had fluctuations in her thyroid function tests. Synthroid was increased to 100 mcg and later to 125 mcg per day. The Synthroid dose was then reduced to 75 mcg. After blood tests about 9 months prior to her first appointment with me, the patient was put back on a dose of 88 mcg per day. When the patient was having fluctuations in her thyroid tests, her thyroid gland became visibly enlarged and felt full and tender to palpation. She was also more hoarse during those periods. Hoarseness came and went in parallel with the thyroid gland swelling. Ultrasound studies of the thyroid in January 2006 and January 2007 showed multinodular goiter.   B. Thyroid function tests performed at that first visit showed a TSH of 4.139, free T4 1.22, and free T3 of 2.7. Because any TSH at that time greater than 3.0 was really high, I increased her Synthroid from 88 to 100 mcg per day. TPO antibody was markedly positive at 954.0, c/w the diagnosis of Hashimoto's thyroiditis.Marland Kitchen TSI was quite normal at 0.9.   2.  During the last 9 years the patient has had several flare-ups of Hashimoto's disease, resulting in several changes in her doses of Synthroid. In February 2011 we had to increase her Synthroid dose to 125 mcg per day. Later that year we reduced the dose to 125 mcg 5 days per week and later to 88 mcg/day 6 days per week. In March 2015 I changed her Synthroid dose to 112 mcg 5 days per week and 168 mcg (1.5 of the 112 mg tablets) 2 days per week.   3. The patient's last [facility] visit was on 11/02/13. In the interim, she has again been through a lot.   A. She has had more problems with fibromyalgia and arthritis in her wrists, knees, and feet. She saw Dr. Hurley Cisco for a rheumatology consult many years ago, but has never been back to see him.  B.  She has not had any more bloody stools. She does have some alternating constipation and diarrhea. She has not been having any abdominal pains.   C. Her neurologist felt that part of her fibromyalgia symptoms in her feet may be due to neuropathy. She has not been back to see him for quite some time. She did not tolerate Tramadol due to fatigue and malaise.   D. Her feet still bother her a lot. Her feet burn in the forefoot area a lot. Steroid injections did not help. The podiatrist wanted to do surgery on her right bunion and right hammer toe. [name] does not want to have surgery  now.   E. Her Meniere's disease of the left ear occasionally acts up, but she has not had any significant vertigo in months. Her ENT doctor had advised doing surgery on her left mastoid area, but she has declined. The hearing in the left ear varies. Her sensation of head fullness continues intermittently, especially on the left side. She has not had to take meclizine recently. She is on a low salt diet to reduce edema within the left ear.   F. Her COPD is fine as long as she uses her spirometer daily.    G. She continues to have fronto-temporal headaches, occasionally involving the nuchal  area and trapezius areas. Her trapezius areas remain tight and tender, but are better. The cervical stretching exercises help when she does them.   H. I changed her Synthroid dose to 112 mcg, one tablet per day, on 05/03/14.. She also takes omeprazole and enalapril.   I. She has sweats during the day and at night since stopping estrogen. Over time her sweats have been better.    J. The wens on her left wrist are smaller since being drained in the past.   K. She is intermittently, chronically hoarse. The hoarseness comes and goes. She has a lot of postnasal drip and some cough. She is not sure if her allergies affect the hoarseness or not.   L. Her depression is "pretty bad lately". Her older sister died recently. The sister was Shelby Lin's best friend. When Shelby Lin's medical problems worsen her depression worsens in parallel.   M. 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  4. Pertinent Review of Systems:  Constitutional: "I have bad days and good days. I often feel just terrible."  Her stamina and energy vary from pretty good to not so good. When she feels good, she sometimes "burns the candle at both ends and then pays for it the next day."   Eyes: Vision is good long when she wears her glasses. She had an eye exam about 24 months ago. There are no significant eye complaints. She has a follow up appointment in May.  Neck: The thyroid gland has not seemed to swell lately. The patient has no complaints of anterior neck soreness, tenderness,  pressure, discomfort, or difficulty swallowing.  Heart: She has not had any chest symptoms. Heart rate increases with exercise or other physical activity. The patient has no other complaints of palpitations, irregular heat beats, chest pain, or chest pressure. Gastrointestinal: As above. Her acid reflux and dyspepsia are pretty well controlled by her omeprazole. She still eats some [place] food, but not as frequently.    Legs:  Muscle mass and strength seem normal. Legs were stronger when she was walking more. The episodic leg numbness and burning only bother her occasionally. She has not had any edema recently.   Feet: As above. Her episodic foot pains are about the same.   GYN: She is still off Premarin. Emotional/psychiatric: She feels "pretty good, considering".   Mental: She does "okay" at thinking, paying attention, remembering, and making decisions. "I have all my marbles."   PAST MEDICAL, FAMILY, AND SOCIAL HISTORY:  Past Medical History  Diagnosis Date  . Hypothyroidism, acquired, autoimmune   . Fibromyalgia syndrome   . Thyroiditis, autoimmune   . Multinodular goiter (nontoxic)   . Tachycardia   . Familial tremor   . Coronary artery spasm   . Dyspepsia   . Fatigue   .  Combined hyperlipidemia   . Dupuytren's contracture   . COPD (chronic obstructive pulmonary disease)   . GERD (gastroesophageal reflux disease)   . Hypertension   . Wears glasses   . Arthritis     Family History  Problem Relation Age of Onset  . Cancer Mother   . Thyroid disease Neg Hx   . Hypertension Mother   . Hypertension Sister   . Diabetes Sister   . Heart attack Father   . Sudden death Father   . Stroke Mother   . Stroke Sister      Current outpatient prescriptions:  .  Cyanocobalamin (VITAMIN B 12 PO), Take by mouth daily., Disp: , Rfl:  .  DULoxetine (CYMBALTA) 60 MG capsule, Take 60 mg by mouth daily., Disp: , Rfl:  .  enalapril (VASOTEC) 2.5 MG tablet, Take 2.5 mg by mouth daily. , Disp: , Rfl:  .  levothyroxine (SYNTHROID, LEVOTHROID) 112 MCG tablet, Take one tablet of brand name synthroid per day 5 days per week and take 1.5 tablets two days per week., Disp: 35 tablet, Rfl: 6 .  mesalamine (LIALDA) 1.2 G EC tablet, Take by mouth daily with breakfast. Two pills daily., Disp: , Rfl:  .  Multiple Vitamin (MULTIVITAMIN) tablet, Take 1 tablet by mouth daily.  , Disp: , Rfl:  .  omeprazole (PRILOSEC) 20 MG  capsule, Take 20 mg by mouth daily., Disp: , Rfl:  .  tiotropium (SPIRIVA) 18 MCG inhalation capsule, Place 18 mcg into inhaler and inhale daily., Disp: , Rfl:  .  VITAMIN D, CHOLECALCIFEROL, PO, Take by mouth daily., Disp: , Rfl:  .  carbamazepine (TEGRETOL) 100 MG chewable tablet, Chew 1 tablet (100 mg total) by mouth 2 (two) times daily. After 1 week increase to three times daily if tolerated (Patient not taking: Reported on 06/16/2014), Disp: 60 tablet, Rfl: 0 .  predniSONE (DELTASONE) 2.5 MG tablet, Take 2.5 mg by mouth daily with breakfast., Disp: , Rfl:   Allergies as of 06/16/2014 - Review Complete 06/16/2014  Allergen Reaction Noted  . Amoxicillin Hives 06/18/2010  . Codeine Nausea And Vomiting 02/21/2013  . Lorabid [loracarbef] Hives 06/18/2010  . Sulfa antibiotics Hives 06/18/2010    1. Work and Family: She retired in June 2013.but later went back to work part-time. Her husband also works 4 days per week.  She stays fairly busy.  2. Activities: Physical activities are somewhat restricted due to foot pain and fatigue. She has not been walking much lately. 3. Smoking, alcohol, or drugs: None 4. Primary Care Provider: Dr. Carol Ada, [facility] [place]: There are no other significant problems involving Kayzlee's other body systems.   Objective:  Vital Signs:  BP 122/78 mmHg  Pulse 94  Wt 150 lb 12.8 oz (68.402 kg) She has lost another 4 lbs since last visit. She says that she is more careful about what she eats, especially breads.  PHYSICAL EXAMINATION  Constitutional: The patient looks tired, but overall good today. She is surprisingly alert, bright, upbeat, and perky.   Eyes: There is no obvious arcus or proptosis. Moisture appears normal. Mouth: The oropharynx and tongue appear normal. Oral moisture is normal. Neck: The neck appears to be visibly normal. No carotid bruits are noted. The thyroid gland is smaller and is well within normal  limits, at about  at about 18 grams in size. The consistency of the thyroid gland is normal. The thyroid gland is not tender to palpation. Her  trapezius girdle is still somewhat tight and somewhat tender. She has a 4-5 mm firm nodular lesion in the left trapezius area. The firm area feels like a lipoma or small lymph node.  Lungs: The lungs are clear to auscultation. Air movement is good. Heart: Heart rate and rhythm are regular. Heart sounds S1 and S2 are normal. I did not appreciate any pathologic cardiac murmurs. Abdomen: The abdomen is somewhat enlarged. Bowel sounds are normal. There is no obvious hepatomegaly, splenomegaly, or other mass effect. The abdomen is not tender to palpation. Arms: Muscle size and bulk are normal for age. Hands: There is 1-2+ tremor. Phalangeal and metacarpophalangeal joints are normal. Palmar muscles are low-normal. Palmar moisture is also normal. Legs: Muscles appear normal for age. No edema is present. She has pain to palpation in the distal shins. Neurologic: Strength is normal for age in both the upper and lower extremities. Muscle tone is normal. Sensation to touch is normal in the legs.   LAB DATA:   04/28/14: TSH 0.081, free T4 1.47, free T3 3.3; calcium 9.4, PTH 26, 25-OH vitamin D 23  10/27/13: TSH 1.087, free T4 1.04, free T3 2.7; Calcium 9.6, PTH 32, 25-hydroxyvitamin D 34  05/18/13: TSH 0.052, free T4 1.24, free T3 3.2  04/28/13: TSH 0.426, free T4 1.24, free T3 3.2; calcium 9.7, 25-hydroxy vitamin D 36  10/08/12: TSH 2.106, free T4 1.05, free T3 2.6; 25-OH vitamin D 37, PTH 39.4, calcium 9.1  04/02/12: TSH 1.073, free T4 1.31, free T3 2.9, calcium 9.5, 25-hydroxy vitamin D 31 - PTH was ordered but not done. I have re-ordered it this morning.  09/30/11: TSH 8.095, free T4 1.16, free T3 2.4. PTH 24, calcium 9.3, 25-hydroxy vitamin D 33, 1,25-dihydroxy vitamin D 95 11/21/10: Calcium was 9.5. Her PTH was 37.6. 25-hydroxy vitamin D was 41. 1, 25-dihydroxy  vitamin D was 85, which was slightly elevated. B12 was 352 ([phone])    10/12/12: US thyroid gland: Small 10 x 4 x 5 mm nodule or adjacent lymph node or parathyroid adenoma. Diffuse echogenicity. As I read the [place], there is definitely a lot of echogenicity c/w Hashimoto's thyroiditis. There may be a small nodule that is actually a bit less than one cm in longest dimension. This is unchanged since June 2011.   Assessment and Plan:   ASSESSMENT:  1. Hypothyroid: She was euthyroid in February and in August 2014, was borderline hyperthyroid and hyperthyroid in March 2015, but was euthyroid in September 2015. In March 2016, however, she ws hypothyroid again. . I consequently reduced her Synthroid dose to 112 mcg/day. It appears that she had another flare up of Hashimoto's disease in March, causing Hashitoxicosis. We will continue to follow her TFTs serially and adjust Synthroid doses as needed.  2. Thyroiditis: The patient's Hashimoto's disease has been clinically quiescent. We'll see if she loses more thyroid cells in the next 2-3 years. 3. Headaches: Headaches appear to be "tension" headaches. They have decreased in both frequency and severity. worsened in the past several months. She needs to resume her cervical stretching exercises twice a day.   4. Multinodular goiter: Thyroid gland is smaller and is again within normal limits for size today. The waxing and waning of thyroid gland size is c/w evolving Hashimoto's disease. Although some [place] studies have shown a nodule, her [place] study in June 2011 showed either a questionable nodule or demonstrated the echo texture heterogeneity that is commonly seen with Hashimoto's disease. Her [place] in August 2014 was  essentially unchanged.  5. Night sweats: The night sweats are much less frequent and severe with time.  6. Leg pains: Calcium is normal, just below the 50%. Her PTH value is mid-range normal. Her vitamin D is low. She needs more calcium and vitamin D. I suggested  changing to Citracal-D in order to take in 1200-1500 mg of calcium per day and [phone] IU of vitamin D per day. Taking half at lunch and half at [place] will be beneficial.  7. Meniere's disease: Her vertigo has essentially resolved. The left ear Meniere's disease is still an issue for her. She may require surgery. Her hearing in the left ear varies. Her sensation of head fullness occurs intermittently. It is likely that her environmental allergies are responsible for some serous fluid buildup in the ear.  8. Hoarseness: The fact that her hoarseness and post nasal drip come and go essentially in parallel indicate that the hoarseness is likely due to flare ups of her allergies. However, severe flare ups of Hashimoto's disease have also caused hoarseness in the past. 9. Dyspepsia: This problem is controlled with omeprazole. 10. Chest pressure with radiation to her jaws: This problem has resolved. Her cardiac cath was clean. Therefore, her symptoms were most likely due to esophageal spasm.  11. Hypertension: She is doing well with enalapril.  12. Depression: She looks great today.   13. Fibromyalgia syndrome: This is her major clinical problem. Exercise will help her "re-charge her batteries". I suggested seeing Dr. Charlestine Night again.   PLAN:  1. Diagnostic: Repeat TFTs, calcium, PTH, and 25-hydroxy vitamin D levels 2 weeks before the next visit. 2. Therapeutic: Do her best to continue daily physical activity. Continue Synthroid at 112 mcg per day. Convert to Citracal-D.  3. Patient education: We discussed the pathophysiologies of headaches, Hashimoto's disease, hypothyroidism, and fibromyalgia.  4. Follow-up: 6 months  Level of Service: This visit lasted in excess of 55 minutes. More than 50% of the visit was devoted to counseling.  Sherrlyn Hock

## 2014-06-16 NOTE — Patient Instructions (Signed)
Follow up visit in 6 months. Please have lab tests drawn about one week prior to next visit.

## 2014-06-21 ENCOUNTER — Ambulatory Visit: Payer: Self-pay | Admitting: "Endocrinology

## 2014-07-06 ENCOUNTER — Other Ambulatory Visit: Payer: Self-pay | Admitting: *Deleted

## 2014-07-06 DIAGNOSIS — E063 Autoimmune thyroiditis: Secondary | ICD-10-CM

## 2014-07-06 MED ORDER — LEVOTHYROXINE SODIUM 112 MCG PO TABS: ORAL_TABLET | ORAL | Status: DC

## 2014-07-09 ENCOUNTER — Other Ambulatory Visit: Payer: Self-pay | Admitting: "Endocrinology

## 2014-08-17 ENCOUNTER — Other Ambulatory Visit: Payer: Self-pay | Admitting: "Endocrinology

## 2014-09-07 ENCOUNTER — Other Ambulatory Visit: Payer: Self-pay | Admitting: *Deleted

## 2014-09-07 DIAGNOSIS — E034 Atrophy of thyroid (acquired): Secondary | ICD-10-CM

## 2014-09-20 LAB — PTH, INTACT AND CALCIUM
CALCIUM: 9.3 mg/dL (ref 8.4–10.5)
PTH: 33 pg/mL (ref 14–64)

## 2014-09-20 LAB — TSH: TSH: 1.64 u[IU]/mL (ref 0.350–4.500)

## 2014-09-20 LAB — CALCIUM: CALCIUM: 9.3 mg/dL (ref 8.6–10.4)

## 2014-09-20 LAB — T4, FREE: Free T4: 1.03 ng/dL (ref 0.80–1.80)

## 2014-09-20 LAB — T3, FREE: T3, Free: 2.6 pg/mL (ref 2.3–4.2)

## 2014-09-20 LAB — VITAMIN D 25 HYDROXY (VIT D DEFICIENCY, FRACTURES): Vit D, 25-Hydroxy: 21 ng/mL — ABNORMAL LOW (ref 30–100)

## 2014-09-26 ENCOUNTER — Encounter: Payer: Self-pay | Admitting: "Endocrinology

## 2014-09-26 ENCOUNTER — Ambulatory Visit (INDEPENDENT_AMBULATORY_CARE_PROVIDER_SITE_OTHER): Payer: Medicare Other | Admitting: "Endocrinology

## 2014-09-26 VITALS — BP 124/75 | HR 85 | Wt 152.8 lb

## 2014-09-26 DIAGNOSIS — R5383 Other fatigue: Secondary | ICD-10-CM

## 2014-09-26 DIAGNOSIS — E063 Autoimmune thyroiditis: Secondary | ICD-10-CM

## 2014-09-26 DIAGNOSIS — I10 Essential (primary) hypertension: Secondary | ICD-10-CM

## 2014-09-26 DIAGNOSIS — E038 Other specified hypothyroidism: Secondary | ICD-10-CM | POA: Diagnosis not present

## 2014-09-26 DIAGNOSIS — R1013 Epigastric pain: Secondary | ICD-10-CM

## 2014-09-26 DIAGNOSIS — E049 Nontoxic goiter, unspecified: Secondary | ICD-10-CM

## 2014-09-26 NOTE — Patient Instructions (Signed)
Follow up appointment in October as planned. Please have lab tests drawn at 8:30 AM.

## 2014-09-26 NOTE — Progress Notes (Signed)
Subjective:  Patient Name: Shelby Lin Date of Birth: 03-23-1940  MRN: [id number]  Shelby Lin  presents to the office today for follow-up of her hypothyroidism secondary to Hashimoto's disease, questionable multinodular goiter, tachycardia, tremor, dyspepsia, fatigue, fibromyalgia, hyperlipidemia, night sweats, and tremor.  HISTORY OF PRESENT ILLNESS:   Shelby Lin is a 74 y.o. Caucasian woman.  Shelby Lin was unaccompanied.  1. Shelby Lin was referred to me on 03/25/2005 by her primary care provider, Dr. Carol Ada, for evaluation of her thyroid problems. Shelby Lin was 64 at that time.   A. At about age 82 the patient developed thyroid problems and was put on a thyroid medicine. Later when she became pregnant her obstetrician took her off that medicine. At about age 64, she developed severe fatigue and weight gain. She was put back on Synthroid. Her dose of Synthroid was 88 mcg per day for many years. In January 2006, she had fluctuations in her thyroid function tests. Synthroid was increased to 100 mcg and later to 125 mcg per day. The Synthroid dose was then reduced to 75 mcg. After blood tests about 9 months prior to her first appointment with me, the patient was put back on a dose of 88 mcg per day. When the patient was having fluctuations in her thyroid tests, her thyroid gland became visibly enlarged and felt full and tender to palpation. She was also more hoarse during those periods. Hoarseness came and went in parallel with the thyroid gland swelling. Ultrasound studies of the thyroid in January 2006 and January 2007 showed multinodular goiter.   B. Thyroid function tests performed at that first visit showed a TSH of 4.139, free T4 1.22, and free T3 of 2.7. Because any TSH at that time greater than 3.0 was really high, I increased her Synthroid from 88 to 100 mcg per day. TPO antibody was markedly positive at 954.0, c/w the diagnosis of Hashimoto's thyroiditis.Marland Kitchen TSI was quite normal at 0.9.   2.  During the last 9 years the patient has had several flare-ups of Hashimoto's disease, resulting in several changes in her doses of Synthroid. In February 2011 we had to increase her Synthroid dose to 125 mcg per day. Later that year we reduced the dose to 125 mcg 5 days per week and later to 88 mcg/day 6 days per week. In March 2015 I changed her Synthroid dose to 112 mcg 5 days per week and 168 mcg (1.5 of the 112 mg tablets) 2 days per week. She has been taking one Citracal-D tablet per day and 1000 IU of vitamin D per day.   3. The patient's last [facility] visit was on 06/16/14. She was supposed to see me in follow up this coming October, but asked to see me today because of incredible fatigue that has been worsening for about two months. She has also been more depressed.   A. Fatigue: She has become progressively more tired in the last 2-3 months. Some days she awakens and feels good and other times is still tired when she awakens. The fatigue tends to be the worst about 4-5 PM. The fatigue is present every day. She typically gets about 8 hours of sleep per night and thinks that the quality and duration of her sleeping are good. She is not aware of any significant snoring, although her husband does complain at times that she snores.  She is not aware of any acute illnesses, URI symptoms, AGI symptoms, or UTI symptoms.   B. Depression: She as  been depressed. One son will not talk with her or other family members. Her sister, who was also her best friend, died 34 months ago and Shelby Lin is still very depressed.   C. She has had more problems with fibromyalgia and arthritis in her wrists, knees, and feet. She saw Dr. Hurley Cisco for a rheumatology consult again. He suggested adding ibuprofen or Advil, but later suggested taking Tylenol.    D.  She has not had any more bloody stools or alternating constipation and diarrhea. She has not been having any abdominal pains.   E. Her feet still bother her a lot. Her  feet burn in the forefoot area a lot. Steroid injections did not help. The podiatrist wanted to do surgery on her right bunion and right hammer toe. [name] does not want to have surgery now.   F. Her Meniere's disease of the left ear has not acted up recently.   G. Her COPD is fine as long as she uses her spirometer daily.    H. She continues to have fronto-temporal headaches, occasionally involving the nuchal area and trapezius areas. Her trapezius areas remain tight and tender at times, but are better overall. The cervical stretching exercises help when she does them.   I. She still takes Synthroid, but has been taking one 112 mcg tablet 6 days per week and 1.5 tablets on the 7th day of each week.  She also takes omeprazole and enalapril.   J. She had not been having sweats during the day for quite a while, but did have some yesterday.   K. The wens on her wrists are still "sore as a boil".    L. She is intermittently, chronically hoarse. The hoarseness comes and goes. She no longer has a lot of postnasal drip and cough.   M. 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 She enjoys the work.  4. Pertinent Review of Systems:  Constitutional: "I feel OK, but tired and depressed.."  Her stamina and energy have both decreased. Eyes: Vision is good long when she wears her glasses. She had an eye exam about 2+ years ago, but has a follow up appointment next month. There are no significant eye complaints. She has a follow up appointment in May.  Neck: The thyroid gland has not seemed to swell lately. The patient has no complaints of anterior neck soreness, tenderness,  pressure, discomfort, or difficulty swallowing.  Heart: She has not had any chest symptoms. Heart rate increases with exercise or other physical activity. The patient has no other complaints of palpitations, irregular heat beats, chest pain, or chest pressure. Gastrointestinal: As above. Her acid  reflux and dyspepsia come and go, usually associated with eating too much of the wrong foods, such as [place] food.    Legs: Muscle mass and strength seem normal. Legs were stronger when she was walking more. The episodic leg numbness and burning only bother her occasionally. She has not had any edema recently.   Feet: As above. Her episodic foot pains are about the same.   GYN: She is still off Premarin. Emotional/psychiatric: She feels "depressed".   Mental: She does "okay" at thinking, paying attention, remembering, and making decisions. "I have all my marbles."   PAST MEDICAL, FAMILY, AND SOCIAL HISTORY:  Past Medical History  Diagnosis Date  . Hypothyroidism, acquired, autoimmune   . Fibromyalgia syndrome   . Thyroiditis, autoimmune   . Multinodular goiter (nontoxic)   .  Tachycardia   . Familial tremor   . Coronary artery spasm   . Dyspepsia   . Fatigue   . Combined hyperlipidemia   . Dupuytren's contracture   . COPD (chronic obstructive pulmonary disease)   . GERD (gastroesophageal reflux disease)   . Hypertension   . Wears glasses   . Arthritis     Family History  Problem Relation Age of Onset  . Cancer Mother   . Thyroid disease Neg Hx   . Hypertension Mother   . Hypertension Sister   . Diabetes Sister   . Heart attack Father   . Sudden death Father   . Stroke Mother   . Stroke Sister      Current outpatient prescriptions:  .  Cyanocobalamin (VITAMIN B 12 PO), Take by mouth daily., Disp: , Rfl:  .  DULoxetine (CYMBALTA) 60 MG capsule, Take 60 mg by mouth daily., Disp: , Rfl:  .  enalapril (VASOTEC) 2.5 MG tablet, Take 2.5 mg by mouth daily. , Disp: , Rfl:  .  Multiple Vitamin (MULTIVITAMIN) tablet, Take 1 tablet by mouth daily.  , Disp: , Rfl:  .  omeprazole (PRILOSEC) 20 MG capsule, Take 20 mg by mouth daily., Disp: , Rfl:  .  SYNTHROID 112 MCG tablet, TAKE TABLET SYNTHROID 112 MCG FIVE DAYS A WEEK AND 1.5 TABLETS 2 DAYS A WEEK, Disp: 35 tablet, Rfl: 4 .   tiotropium (SPIRIVA) 18 MCG inhalation capsule, Place 18 mcg into inhaler and inhale daily., Disp: , Rfl:  .  VITAMIN D, CHOLECALCIFEROL, PO, Take by mouth daily., Disp: , Rfl:  .  carbamazepine (TEGRETOL) 100 MG chewable tablet, Chew 1 tablet (100 mg total) by mouth 2 (two) times daily. After 1 week increase to three times daily if tolerated (Patient not taking: Reported on 06/16/2014), Disp: 60 tablet, Rfl: 0 .  mesalamine (LIALDA) 1.2 G EC tablet, Take by mouth daily with breakfast. Two pills daily., Disp: , Rfl:  .  predniSONE (DELTASONE) 2.5 MG tablet, Take 2.5 mg by mouth daily with breakfast., Disp: , Rfl:   Allergies as of 09/26/2014 - Review Complete 09/26/2014  Allergen Reaction Noted  . Amoxicillin Hives 06/18/2010  . Codeine Nausea And Vomiting 02/21/2013  . Lorabid [loracarbef] Hives 06/18/2010  . Sulfa antibiotics Hives 06/18/2010    1. Work and Family: She retired in June 2013, but later went back to work part-time. Her husband also works 4 days per week.  She stays fairly busy.  2. Activities: Physical activities are somewhat restricted due to foot pain and fatigue. She has not been walking much lately. 3. Smoking, alcohol, or drugs: None 4. Primary Care Provider: Dr. Carol Ada, [facility] [place]: There are no other significant problems involving Glorya's other body systems.   Objective:  Vital Signs:  BP 124/75 mmHg  Pulse 85  Wt 152 lb 12.8 oz (69.31 kg) She has gained 2 pounds since last visit.   PHYSICAL EXAMINATION  Constitutional: The patient looks pretty good today. Except for becoming sad when she mentioned the loss of her sister, Cherrie was upbeat, fairly chatty, had a good sense of humor, had a normal affect, and had good insight. Eyes: There is no obvious arcus or proptosis. Moisture appears normal. Mouth: The oropharynx and tongue appear normal. Oral moisture is normal. There is no oral hyperpigmentation. Neck: The neck  appears to be visibly normal. No carotid bruits are noted. The thyroid gland is smaller and is well within  normal limits, at about 18 grams in size. The consistency of the thyroid gland is normal. The thyroid gland is not tender to palpation. Her trapezius girdle is still somewhat tight, but ise not tender. She has a 4-5 mm firm nodular lesion in the left trapezius area. The firm area feels like a lipoma.  Lungs: The lungs are clear to auscultation. Air movement is good. Heart: Heart rate and rhythm are regular. Heart sounds S1 and S2 are normal. I did not appreciate any pathologic cardiac murmurs. Abdomen: The abdomen is somewhat enlarged. Bowel sounds are normal. There is no obvious hepatomegaly, splenomegaly, or other mass effect. The abdomen is not tender to palpation. Arms: Muscle size and bulk are normal for age. She has a 4-5 mm nodule of her right medial forearm.  Hands: There is 1-2+ tremor. Phalangeal and metacarpophalangeal joints are normal. Palmar muscles are low-normal. Palmar moisture is also normal.There is no palmar hyperpigmentation.  Legs: Muscles appear normal for age. No edema is present. She has pain to palpation in the distal shins. Neurologic: Strength is normal for age in both the upper and lower extremities. Muscle tone is normal. Sensation to touch is normal in the legs.   LAB DATA:   09/19/14: TSH 1.640, free T4 1.03, free T3 2.6; Calcium 9.3, PTH 33 (increased from 26 in March), 25-OH vitamin D 21 (decreased from 23 in march)  04/28/14: TSH 0.081, free T4 1.47, free T3 3.3; calcium 9.4, PTH 26, 25-OH vitamin D 23  10/27/13: TSH 1.087, free T4 1.04, free T3 2.7; Calcium 9.6, PTH 32, 25-hydroxyvitamin D 34  05/18/13: TSH 0.052, free T4 1.24, free T3 3.2  04/28/13: TSH 0.426, free T4 1.24, free T3 3.2; calcium 9.7, 25-hydroxy vitamin D 36  10/08/12: TSH 2.106, free T4 1.05, free T3 2.6; 25-OH vitamin D 37, PTH 39.4, calcium 9.1  04/02/12: TSH 1.073, free T4 1.31, free T3  2.9, calcium 9.5, 25-hydroxy vitamin D 31 - PTH was ordered but not done. I have re-ordered it this morning.  09/30/11: TSH 8.095, free T4 1.16, free T3 2.4. PTH 24, calcium 9.3, 25-hydroxy vitamin D 33, 1,25-dihydroxy vitamin D 95 11/21/10: Calcium was 9.5. Her PTH was 37.6. 25-hydroxy vitamin D was 41. 1, 25-dihydroxy vitamin D was 85, which was slightly elevated. B12 was 352 ([phone])    10/12/12: US thyroid gland: Small 10 x 4 x 5 mm nodule or adjacent lymph node or parathyroid adenoma. Diffuse echogenicity. As I read the [place], there is definitely a lot of echogenicity c/w Hashimoto's thyroiditis. There may be a small nodule that is actually a bit less than one cm in longest dimension. This is unchanged since June 2011.   Assessment and Plan:   ASSESSMENT:  1. Hypothyroid:   A. She has acquired hypothyroidism due to Hashimoto's thyroiditis. She was euthyroid in February and in August 2014, was borderline hyperthyroid and hyperthyroid in March 2015, but was euthyroid in September 2015. In March 2016, however, she ws hyperthyroid again. . I consequently reduced her Synthroid dose to 112 mcg/day. It appears that she had another flare up of Hashimoto's disease in March, causing Hashitoxicosis. Since then she has actually increased her Synthroid dose by one-half pill per week.   B. She is currently euthyroid in the middle of the normal range. We will continue to follow her TFTs serially and adjust Synthroid doses as needed.  2. Thyroiditis: The patient's Hashimoto's disease has been clinically quiescent, but was subclinically active in the January-March time period.  We'll see if she loses more thyroid cells in the next 2-3 years. 3. Headaches: Headaches appear to be "tension" headaches. They have decreased in both frequency and severity. She needs to perform her cervical stretching exercises daily.   4. Multinodular goiter: Thyroid gland is smaller and is again within normal limits for size today. The  waxing and waning of thyroid gland size is c/w evolving Hashimoto's disease. Although some [place] studies have shown a nodule, her [place] study in June 2011 showed either a questionable nodule or demonstrated the echo texture heterogeneity that is commonly seen with Hashimoto's disease. Her [place] in August 2014 was essentially unchanged.  5. Night sweats: The night sweats are much less frequent and severe with time.  6. Leg pains: Calcium is normal, just below the 50%. Her PTH value is mid-range normal. Her vitamin D is lower. She needs more calcium and vitamin D. I had previously asked her to take Citracal-D at doses of 1200-1500 mg of calcium per day and [phone] IU of vitamin D per day. Unfortunately, she has been taking only one Citracal-D tablet per day. Taking her Citracal-D at both lunch and dinner will be beneficial.  7. Meniere's disease: Her vertigo has essentially resolved. Her left ear Meniere's disease has not bothered her for months. She may require surgery. Her hearing in the left ear varies. It is likely that her environmental allergies are responsible for some serous fluid buildup in the ear.  8. Hoarseness: The fact that her hoarseness and post nasal drip come and go essentially in parallel indicate that the hoarseness is likely due to flare ups of her allergies. However, several flare ups of Hashimoto's disease have also caused hoarseness in the past. 9. Dyspepsia: This problem is fairly well controlled with omeprazole. 10. Chest pressure with radiation to her jaws: This problem has resolved. Her cardiac cath was clean. Therefore, her symptoms were most likely due to esophageal spasm.  11. Hypertension: She is doing well with enalapril.  12. Depression: She looks great today.   13. Fibromyalgia syndrome: This is her major clinical problem. Exercise will help her "re-charge her batteries". She does not feel that her visit to Dr. Charlestine Night helped her.   14. Fatigue: I suspect that her fatigue is due to  a combination of fibromyalgia, depression, and getting progressively out of shape. She does not have any clinical stigmata of adrenal insufficiency. However, it would be reasonable to check a morning cortisol and ACTH values, as well as a CMP, CBC, iron, and B12.   PLAN:  1. Diagnostic: At 8:30 AM draw CMP, CBC, iron, B12, folate, ACTH, and cortisol. 2. Therapeutic: Do her best to continue daily physical activity. Continue Synthroid at current doses. Take Citracal-D twice daily. Stop the vitamin D, 1000 IU/day. Start Biotek, 50,000 IU per week. .  3. Patient education: We discussed the pathophysiologies of headaches, Hashimoto's disease, hypothyroidism, fibromyalgia, and fatigue.  4. Follow-up: 6 months  Level of Service: This visit lasted in excess of 65 minutes. More than 50% of the visit was devoted to counseling.  Sherrlyn Hock

## 2014-09-28 LAB — COMPREHENSIVE METABOLIC PANEL
ALT: 11 U/L (ref 6–29)
AST: 17 U/L (ref 10–35)
Albumin: 4 g/dL (ref 3.6–5.1)
Alkaline Phosphatase: 127 U/L (ref 33–130)
BILIRUBIN TOTAL: 0.5 mg/dL (ref 0.2–1.2)
BUN: 17 mg/dL (ref 7–25)
CO2: 30 mmol/L (ref 20–31)
CREATININE: 0.66 mg/dL (ref 0.60–0.93)
Calcium: 9.3 mg/dL (ref 8.6–10.4)
Chloride: 97 mmol/L — ABNORMAL LOW (ref 98–110)
Glucose, Bld: 128 mg/dL — ABNORMAL HIGH (ref 70–99)
POTASSIUM: 4.5 mmol/L (ref 3.5–5.3)
Sodium: 140 mmol/L (ref 135–146)
Total Protein: 6.4 g/dL (ref 6.1–8.1)

## 2014-09-28 LAB — VITAMIN B12: Vitamin B-12: 705 pg/mL (ref 211–911)

## 2014-09-28 LAB — HEMOGLOBIN A1C
Hgb A1c MFr Bld: 6.2 % — ABNORMAL HIGH (ref <5.7)
MEAN PLASMA GLUCOSE: 131 mg/dL — AB (ref <117)

## 2014-09-28 LAB — CBC WITH DIFFERENTIAL/PLATELET
Basophils Absolute: 0.1 10*3/uL (ref 0.0–0.1)
Basophils Relative: 1 % (ref 0–1)
Eosinophils Absolute: 0.3 10*3/uL (ref 0.0–0.7)
Eosinophils Relative: 3 % (ref 0–5)
HCT: 39.4 % (ref 36.0–46.0)
Hemoglobin: 13.1 g/dL (ref 12.0–15.0)
LYMPHS PCT: 41 % (ref 12–46)
Lymphs Abs: 3.6 10*3/uL (ref 0.7–4.0)
MCH: 29.8 pg (ref 26.0–34.0)
MCHC: 33.2 g/dL (ref 30.0–36.0)
MCV: 89.7 fL (ref 78.0–100.0)
MONO ABS: 0.6 10*3/uL (ref 0.1–1.0)
MPV: 9.5 fL (ref 8.6–12.4)
Monocytes Relative: 7 % (ref 3–12)
NEUTROS PCT: 48 % (ref 43–77)
Neutro Abs: 4.2 10*3/uL (ref 1.7–7.7)
Platelets: 413 10*3/uL — ABNORMAL HIGH (ref 150–400)
RBC: 4.39 MIL/uL (ref 3.87–5.11)
RDW: 13.3 % (ref 11.5–15.5)
WBC: 8.8 10*3/uL (ref 4.0–10.5)

## 2014-09-28 LAB — IRON: Iron: 81 ug/dL (ref 42–145)

## 2014-09-29 LAB — CORTISOL: Cortisol, Plasma: 12.9 ug/dL

## 2014-10-04 LAB — ACTH: C206 ACTH: 21 pg/mL (ref 6–50)

## 2014-10-09 ENCOUNTER — Telehealth: Payer: Self-pay | Admitting: "Endocrinology

## 2014-10-09 NOTE — Telephone Encounter (Signed)
Routed to provider

## 2014-10-31 ENCOUNTER — Telehealth: Payer: Self-pay | Admitting: "Endocrinology

## 2014-10-31 NOTE — Telephone Encounter (Signed)
1. Patient called requesting lab results from august. She asked me to leave a voice mail message on her phone if she were not available to speak with me directly.  2. When I called back she was not available. I left the following voice mail message: CMP, CBC, iron, ACTH, cortisol, and vitamin B12 were normal. HbA1c was in the pre-diabetes range, which she already knew.   Sherrlyn Hock

## 2014-11-29 NOTE — Telephone Encounter (Signed)
Handled by [name] on 9/6

## 2014-12-04 ENCOUNTER — Ambulatory Visit (INDEPENDENT_AMBULATORY_CARE_PROVIDER_SITE_OTHER): Payer: Medicare Other | Admitting: "Endocrinology

## 2014-12-04 ENCOUNTER — Encounter: Payer: Self-pay | Admitting: "Endocrinology

## 2014-12-04 VITALS — BP 115/74 | HR 87 | Wt 155.0 lb

## 2014-12-04 DIAGNOSIS — E038 Other specified hypothyroidism: Secondary | ICD-10-CM | POA: Diagnosis not present

## 2014-12-04 DIAGNOSIS — I10 Essential (primary) hypertension: Secondary | ICD-10-CM

## 2014-12-04 DIAGNOSIS — R5383 Other fatigue: Secondary | ICD-10-CM | POA: Diagnosis not present

## 2014-12-04 DIAGNOSIS — R739 Hyperglycemia, unspecified: Secondary | ICD-10-CM | POA: Diagnosis not present

## 2014-12-04 DIAGNOSIS — R7303 Prediabetes: Secondary | ICD-10-CM

## 2014-12-04 DIAGNOSIS — E049 Nontoxic goiter, unspecified: Secondary | ICD-10-CM | POA: Diagnosis not present

## 2014-12-04 DIAGNOSIS — E063 Autoimmune thyroiditis: Secondary | ICD-10-CM

## 2014-12-04 DIAGNOSIS — R1013 Epigastric pain: Secondary | ICD-10-CM

## 2014-12-04 DIAGNOSIS — M797 Fibromyalgia: Secondary | ICD-10-CM

## 2014-12-04 LAB — POCT GLYCOSYLATED HEMOGLOBIN (HGB A1C): HEMOGLOBIN A1C: 6.1

## 2014-12-04 NOTE — Patient Instructions (Addendum)
Follow up in 3 months. Please repeat blood tests one week  prior to next visit

## 2014-12-04 NOTE — Progress Notes (Signed)
Subjective:  Patient Name: Shelby Lin Date of Birth: 1940/06/07  MRN: [id number]  Shelby Lin  presents to the office today for follow-up of her hypothyroidism secondary to Hashimoto's disease, questionable multinodular goiter, tachycardia, tremor, dyspepsia, fatigue, fibromyalgia, hyperlipidemia, night sweats, and tremor.  HISTORY OF PRESENT ILLNESS:   Shelby Lin is a 74 y.o. Caucasian woman.  [name] was unaccompanied.  1. Shelby Lin was referred to me on 03/25/2005 by her primary care provider, Dr. Carol Ada, for evaluation of her thyroid problems. Shelby Lin was 66 at that time.   A. At about age 30 the patient developed thyroid problems and was put on a thyroid medicine. Later when she became pregnant her obstetrician took her off that medicine. At about age 13, she developed severe fatigue and weight gain. She was put back on Synthroid. Her dose of Synthroid was 88 mcg per day for many years. In January 2006, she had fluctuations in her thyroid function tests. Synthroid was increased to 100 mcg and later to 125 mcg per day. The Synthroid dose was then reduced to 75 mcg. After blood tests about 9 months prior to her first appointment with me, the patient was put back on a dose of 88 mcg per day. When the patient was having fluctuations in her thyroid tests, her thyroid gland became visibly enlarged and felt full and tender to palpation. She was also more hoarse during those periods. Hoarseness came and went in parallel with the thyroid gland swelling. Ultrasound studies of the thyroid in January 2006 and January 2007 showed multinodular goiter.   B. Thyroid function tests performed at that first visit showed a TSH of 4.139, free T4 1.22, and free T3 of 2.7. Because any TSH at that time greater than 3.0 was really high, I increased her Synthroid from 88 to 100 mcg per day. TPO antibody was markedly positive at 954.0, c/w the diagnosis of Hashimoto's thyroiditis.Marland Kitchen TSI was quite normal at 0.9.   2.  During the last 9 years the patient has had several flare-ups of Hashimoto's disease, resulting in several changes in her doses of Synthroid. In February 2011 we had to increase her Synthroid dose to 125 mcg per day. Later that year we reduced the dose to 125 mcg 5 days per week and later to 88 mcg/day 6 days per week. In March 2015 I changed her Synthroid dose to 112 mcg 5 days per week and 168 mcg (1.5 of the 112 mg tablets) 2 days per week. She has been taking one Citracal-D tablet per day and 1000 IU of vitamin D per day. Her Synthroid doe was subsequently increased to 112 mcg/day.  3. The patient's last [facility] visit was on 09/26/14. In the interim she has been generally healthy and feels better overall, but has had a recurrence of hot spells and sweats. Her energy is about the same. She is not quite as depressed overall, but does get depressed when she has the hot spells and sweats. .    A. Fatigue: This problem is about the same. She typically gets about 8 hours of sleep per night and thinks that the quality and duration of her sleeping are good. She is not aware of any significant snoring, although her husband occasionally complains that she snores.  She is not aware of any acute illnesses, URI symptoms, AGI symptoms, or UTI symptoms.   B. Depression: She has not been as depressed. One son will not talk with her or other family members. Her sister, who  was also her best friend, died 4 months ago. Shelby Lin is gradually coming to grips with her death.    C. She continues to have daily problems with fibromyalgia and arthritis in her wrists, knees, and feet.   D.  She has not had any more bloody stools or alternating constipation and diarrhea. She has not been having any abdominal pains.   E. Her feet still bother her a lot. Her feet burn in the forefoot area a lot. Steroid injections did not help. The podiatrist wanted to do surgery on her right bunion and right hammer toe. Shelby Lin does not want to have  surgery now.   F. Her Meniere's disease of the left ear has not acted up recently.   G. Her COPD is fine as long as she uses her spirometer daily.    H. She continues to have fronto-temporal headaches, occasionally involving the nuchal area and trapezius areas. Her trapezius areas remain tight and tender at times, but are better overall. The cervical stretching exercises help when she does them.   I. She had a problems with substernal chest pain about 6 weeks ago. She saw [name], who told her that it was not her heart. [name] felt that she had a recurrence of esophageal spasm and changed her from omeprazole to Protonix.   J. She still takes Synthroid, one 112 mcg tablet daily. She also takes Protonix and enalapril.   K. She had worsening of her hot flashes and sweats in the past two months. The hot flashes occur during the day and the night.    L. The wens on her wrists are much smaller, but still sore.    M. She is intermittently hoarse. The hoarseness comes and goes, now about once a month. The hoarseness may last 12-18 hours. She no longer has a lot of postnasal drip and cough.    4. Pertinent Review of Systems:  Constitutional: "I feel pretty good today."  Her stamina and energy are about the same. Some days are better than others.  Eyes: Vision is good as long when she wears her glasses. She had an eye exam about 2+ years ago, but has a follow up appointment next Thursday. There are no significant eye complaints.  Neck: The thyroid gland has not seemed to swell lately. The patient has no complaints of anterior neck soreness, tenderness,  pressure, discomfort, or difficulty swallowing.  Heart: She has not had any other chest symptoms. Heart rate increases with exercise or other physical activity. The patient has no other complaints of palpitations, irregular heat beats, chest pain, or chest pressure. Gastrointestinal: As above. Protonix is helping her. Her acid reflux and dyspepsia are  better, but can recur if she  eats too much of the wrong foods, such as [place] food.    Legs: Muscle mass and strength seem normal. Legs were stronger when she was walking more. The episodic leg numbness and burning only bother her occasionally. She has not had any edema recently.   Feet: As above. Her episodic foot pains are about the same.   GYN: She is still off Premarin. Emotional/psychiatric: She feels "pretty good today".   Mental: She does "okay" at thinking, paying attention, remembering, and making decisions. "I have all my marbles."   PAST MEDICAL, FAMILY, AND SOCIAL HISTORY:  Past Medical History  Diagnosis Date  . Hypothyroidism, acquired, autoimmune   . Fibromyalgia syndrome   . Thyroiditis, autoimmune   . Multinodular goiter (nontoxic)   .  Tachycardia   . Familial tremor   . Coronary artery spasm ([place])   . Dyspepsia   . Fatigue   . Combined hyperlipidemia   . Dupuytren's contracture   . COPD (chronic obstructive pulmonary disease) ([place])   . GERD (gastroesophageal reflux disease)   . Hypertension   . Wears glasses   . Arthritis     Family History  Problem Relation Age of Onset  . Cancer Mother   . Thyroid disease Neg Hx   . Hypertension Mother   . Hypertension Sister   . Diabetes Sister   . Heart attack Father   . Sudden death Father   . Stroke Mother   . Stroke Sister      Current outpatient prescriptions:  .  DULoxetine (CYMBALTA) 60 MG capsule, Take 60 mg by mouth daily., Disp: , Rfl:  .  enalapril (VASOTEC) 2.5 MG tablet, Take 2.5 mg by mouth daily. , Disp: , Rfl:  .  Multiple Vitamin (MULTIVITAMIN) tablet, Take 1 tablet by mouth daily.  , Disp: , Rfl:  .  omeprazole (PRILOSEC) 20 MG capsule, Take 20 mg by mouth daily., Disp: , Rfl:  .  SYNTHROID 112 MCG tablet, TAKE TABLET SYNTHROID 112 MCG FIVE DAYS A WEEK AND 1.5 TABLETS 2 DAYS A WEEK, Disp: 35 tablet, Rfl: 4 .  tiotropium (SPIRIVA) 18 MCG inhalation capsule, Place 18 mcg into inhaler and inhale  daily., Disp: , Rfl:  .  VITAMIN D, CHOLECALCIFEROL, PO, Take by mouth daily., Disp: , Rfl:  .  carbamazepine (TEGRETOL) 100 MG chewable tablet, Chew 1 tablet (100 mg total) by mouth 2 (two) times daily. After 1 week increase to three times daily if tolerated (Patient not taking: Reported on 06/16/2014), Disp: 60 tablet, Rfl: 0 .  Cyanocobalamin (VITAMIN B 12 PO), Take by mouth daily., Disp: , Rfl:  .  mesalamine (LIALDA) 1.2 G EC tablet, Take by mouth daily with breakfast. Two pills daily., Disp: , Rfl:  .  predniSONE (DELTASONE) 2.5 MG tablet, Take 2.5 mg by mouth daily with breakfast., Disp: , Rfl:   Allergies as of 12/04/2014 - Review Complete 12/04/2014  Allergen Reaction Noted  . Amoxicillin Hives 06/18/2010  . Codeine Nausea And Vomiting 02/21/2013  . Lorabid [loracarbef] Hives 06/18/2010  . Sulfa antibiotics Hives 06/18/2010    1. Work and Family: She retired in June 2013, but later went back to work part-time. As of today she will only work on Mondays. She stays fairly busy. Her husband still works 4 days per week.   2. Activities: Physical activities are somewhat restricted due to foot pain and fatigue. She has not been walking much lately. 3. Smoking, alcohol, or drugs: None 4. Primary Care Provider: Dr. Carol Ada, [facility] [place]: There are no other significant problems involving Haunani's other body systems.   Objective:  Vital Signs:  BP 115/74 mmHg  Pulse 87  Wt 155 lb (70.308 kg) She has gained 2 pounds since last visit.   PHYSICAL EXAMINATION  Constitutional: The patient looks good today. Jesusita was upbeat, chatty, had a good sense of humor, had a normal affect, and had good insight. She usually looks much better than her review of systems would suggest.  Eyes: There is no obvious arcus or proptosis. Moisture appears normal. Mouth: The oropharynx and tongue appear normal. Oral moisture is normal. There is no oral  hyperpigmentation. Neck: The neck appears to be visibly normal. No carotid bruits are noted. The  thyroid gland is again within normal limits at about 18 grams in size. The consistency of the thyroid gland is normal. The thyroid gland is not tender to palpation. Her trapezius girdle is still somewhat tight, but ise not tender. She has a 4-5 mm firm nodular lesion in the left trapezius area. The firm area feels like a lipoma.  Lungs: The lungs are clear to auscultation. Air movement is good. Heart: Heart rate and rhythm are regular. Heart sounds S1 and S2 are normal. I did not appreciate any pathologic cardiac murmurs. Abdomen: The abdomen is somewhat enlarged. Bowel sounds are normal. There is no obvious hepatomegaly, splenomegaly, or other mass effect. The abdomen is not tender to palpation. Arms: Muscle size and bulk are normal for age.  Hands: There is 1-2+ tremor. Phalangeal and metacarpophalangeal joints are normal. Palmar muscles are low-normal. Palmar moisture is also normal. There is no palmar hyperpigmentation.  Legs: Muscles appear normal for age. No edema is present. She has pain to palpation in the distal shins. Neurologic: Strength is normal for age in both the upper and lower extremities. Muscle tone is normal. Sensation to touch is normal in the legs.   LAB DATA:   Labs 12/04/14: HbA1c 6.1%  Labs 09/26/14 at 12:01 PM: CMP normal; HbA1c 6.2%;CBC normal, iron 81; ACTH 21, cortisol 12.9; vitamin B12 705 (normal 211-911)  09/19/14: TSH 1.640, free T4 1.03, free T3 2.6; Calcium 9.3, PTH 33 (increased from 26 in March), 25-OH vitamin D 21 (decreased from 23 in march)  04/28/14: TSH 0.081, free T4 1.47, free T3 3.3; calcium 9.4, PTH 26, 25-OH vitamin D 23  10/27/13: TSH 1.087, free T4 1.04, free T3 2.7; Calcium 9.6, PTH 32, 25-hydroxyvitamin D 34  05/18/13: TSH 0.052, free T4 1.24, free T3 3.2  04/28/13: TSH 0.426, free T4 1.24, free T3 3.2; calcium 9.7, 25-hydroxy vitamin D 36  10/08/12:  TSH 2.106, free T4 1.05, free T3 2.6; 25-OH vitamin D 37, PTH 39.4, calcium 9.1  04/02/12: TSH 1.073, free T4 1.31, free T3 2.9, calcium 9.5, 25-hydroxy vitamin D 31 - PTH was ordered but not done. I have re-ordered it this morning.  09/30/11: TSH 8.095, free T4 1.16, free T3 2.4. PTH 24, calcium 9.3, 25-hydroxy vitamin D 33, 1,25-dihydroxy vitamin D 95 11/21/10: Calcium was 9.5. Her PTH was 37.6. 25-hydroxy vitamin D was 41. 1, 25-dihydroxy vitamin D was 85, which was slightly elevated. B12 was 352 (211-911)    10/12/12: US thyroid gland: Small 10 x 4 x 5 mm nodule or adjacent lymph node or parathyroid adenoma. Diffuse echogenicity. As I read the [place], there is definitely a lot of echogenicity c/w Hashimoto's thyroiditis. There may be a small nodule that is actually a bit less than one cm in longest dimension. This is unchanged since June 2011.   Assessment and Plan:   ASSESSMENT:  1. Hypothyroid:   A. She has acquired hypothyroidism due to Hashimoto's thyroiditis. She was euthyroid in February and in August 2014, was borderline hyperthyroid and hyperthyroid on two different occasions 20 days apart in March 2015, but was euthyroid in September 2015. In March 2016, however, she was hyperthyroid again. . I consequently reduced her Synthroid dose to 112 mcg/day. It appears that she had another flare up of Hashimoto's disease in March, causing Hashitoxicosis.   B. In July 2016 she was euthyroid in the middle of the normal range. We will continue to follow her TFTs serially and adjust Synthroid doses as needed.  2. Thyroiditis:  The patient's Hashimoto's disease has been clinically quiescent, but was subclinically active in the January-March time period. We'll see if she loses more thyroid cells in the next 2-3 years. 3. Headaches: Headaches appear to be "tension" headaches. They have decreased in both frequency and severity. She needs to perform her cervical stretching exercises daily.   4. Multinodular  goiter: Thyroid gland is smaller and is again within normal limits for size today. The waxing and waning of thyroid gland size is c/w evolving Hashimoto's disease. Although some [place] studies have shown a nodule, her [place] study in June 2011 showed either a questionable nodule or demonstrated the echo texture heterogeneity that is commonly seen with Hashimoto's disease. Her [place] in August 2014 was essentially unchanged. It is time to repeat that study.  5. Hot flashes and sweats: These problems are more frequent again. I wish that I knew the cause and knew an effective treatment.  6. Leg pains: Her labs in July showed that her calcium was normal at 9.3, just below the 50%. Her PTH value was mid-range normal. Her vitamin D was lower. She needed more calcium and vitamin D. I had previously asked her to take Citracal-D at doses of 1200-1500 mg of calcium per day and [phone] IU of vitamin D per day. She now takes a MVI and two calcium-D tablets daily.   7. Meniere's disease: Her vertigo has essentially resolved. Her left ear Meniere's disease has not bothered her for months. Her hearing in the left ear varies. It is likely that her environmental allergies are responsible for some serous fluid buildup in the ear. It's possible that she might require further treatment in the future, up to and including surgery. 8. Hoarseness: The fact that her hoarseness and post nasal drip come and go essentially in parallel indicate that the hoarseness is likely due to flare ups of her allergies. However, several flare ups of Hashimoto's disease have also caused hoarseness in the past. 9. Dyspepsia: This problem is fairly well controlled with Protonix. 10. Chest pressure with radiation to her jaws: This problem has resolved. Her cardiac cath was clean. Therefore, her symptoms were most likely due to esophageal spasm.  11. Hypertension: She is doing well with enalapril.  12. Depression: She really looks great today. At this visit and in  other recent visits she always looks much better than her Review of Systems would indicate.   13. Fibromyalgia syndrome: This is her major clinical problem. Exercise will help her "re-charge her batteries". She does not feel that her visit to Dr. Charlestine Night helped her.   14. Fatigue: I suspect that her fatigue is due to a combination of fibromyalgia, depression, and getting progressively out of shape. She does not have any clinical stigmata of adrenal insufficiency. Fortunately her  Cortisol, ACTH, CMP, CBC, iron, and B12 were all normal in July.  15. Hyperglycemia: Her HbA1c in July was elevated into the "pre-diabetes" range. Today her HbA1c is 6.1%, again in the middle of the prediabetes range. She definitely has pre-diabetes. It appears that she may be following her sister's pattern of developing T2DM at about age 62. The sister controlled her DM through metformin and diet.   PLAN:  1. Diagnostic: HbA1c now. Prior to next visit repeat TFTS, C-peptide and thyroid US. 2. Therapeutic: Do her best to continue daily physical activity. Continue Synthroid at current doses. Take Citracal-D twice daily. Start Biotek, 50,000 IU per week. Refer to [facility]. Consider adding metformin at her next visit.  3.  Patient education: We discussed the pathophysiologies of headaches, Hashimoto's disease, hypothyroidism, fibromyalgia, and fatigue. We also discussed the Eat Right Diet.  4. Follow-up: 3 months  Level of Service: This visit lasted in excess of 65 minutes. More than 50% of the visit was devoted to counseling.  Sherrlyn Hock

## 2014-12-06 ENCOUNTER — Ambulatory Visit: Payer: Self-pay | Admitting: "Endocrinology

## 2014-12-07 ENCOUNTER — Ambulatory Visit
Admission: RE | Admit: 2014-12-07 | Discharge: 2014-12-07 | Disposition: A | Discharge status: Home / Self Care | Payer: Medicare Other | Source: Ambulatory Visit | Attending: "Endocrinology | Admitting: "Endocrinology

## 2014-12-08 ENCOUNTER — Other Ambulatory Visit: Payer: Medicare Other

## 2015-03-07 ENCOUNTER — Ambulatory Visit: Payer: Medicare Other | Admitting: "Endocrinology

## 2015-04-04 ENCOUNTER — Other Ambulatory Visit: Payer: Self-pay | Admitting: *Deleted

## 2015-04-04 DIAGNOSIS — E034 Atrophy of thyroid (acquired): Secondary | ICD-10-CM

## 2015-04-04 MED ORDER — LEVOTHYROXINE SODIUM 112 MCG PO TABS: ORAL_TABLET | ORAL | Status: DC

## 2015-04-05 ENCOUNTER — Other Ambulatory Visit: Payer: Self-pay | Admitting: "Endocrinology

## 2015-04-25 LAB — C-PEPTIDE: C PEPTIDE: 1.43 ng/mL (ref 0.80–3.85)

## 2015-05-08 ENCOUNTER — Ambulatory Visit (INDEPENDENT_AMBULATORY_CARE_PROVIDER_SITE_OTHER): Payer: Medicare Other | Admitting: "Endocrinology

## 2015-05-08 ENCOUNTER — Encounter: Payer: Self-pay | Admitting: "Endocrinology

## 2015-05-08 VITALS — BP 105/72 | HR 92 | Wt 150.6 lb

## 2015-05-08 DIAGNOSIS — E559 Vitamin D deficiency, unspecified: Secondary | ICD-10-CM

## 2015-05-08 DIAGNOSIS — I10 Essential (primary) hypertension: Secondary | ICD-10-CM

## 2015-05-08 DIAGNOSIS — E049 Nontoxic goiter, unspecified: Secondary | ICD-10-CM | POA: Diagnosis not present

## 2015-05-08 DIAGNOSIS — E063 Autoimmune thyroiditis: Secondary | ICD-10-CM

## 2015-05-08 DIAGNOSIS — R1013 Epigastric pain: Secondary | ICD-10-CM

## 2015-05-08 DIAGNOSIS — M79606 Pain in leg, unspecified: Secondary | ICD-10-CM

## 2015-05-08 DIAGNOSIS — E038 Other specified hypothyroidism: Secondary | ICD-10-CM

## 2015-05-08 DIAGNOSIS — R7303 Prediabetes: Secondary | ICD-10-CM

## 2015-05-08 DIAGNOSIS — F32A Depression, unspecified: Secondary | ICD-10-CM

## 2015-05-08 DIAGNOSIS — F329 Major depressive disorder, single episode, unspecified: Secondary | ICD-10-CM

## 2015-05-08 DIAGNOSIS — R5383 Other fatigue: Secondary | ICD-10-CM

## 2015-05-08 NOTE — Patient Instructions (Signed)
Follow up visit in 3 months. 

## 2015-05-08 NOTE — Progress Notes (Signed)
Subjective:  Patient Name: Shelby Lin Date of Birth: 1941-01-18  MRN: [id number]  Shelby Lin  presents to the office today for follow-up of her hypothyroidism secondary to Hashimoto's disease, questionable nodular goiter, tachycardia, tremor, dyspepsia, fatigue, fibromyalgia, hyperlipidemia, night sweats, tremor, and pre-diabetes.  HISTORY OF PRESENT ILLNESS:   Shelby Lin is a 75 y.o. Caucasian woman.  Shelby Lin was unaccompanied.  1. Shelby Lin was referred to me on 03/25/2005 by her primary care provider, Dr. Carol Ada, for evaluation of her thyroid problems. Shelby Lin was 66 at that time.   A. At about age 14 the patient developed thyroid problems and was put on a thyroid medicine. Later when she became pregnant her obstetrician took her off that medicine. At about age 66, she developed severe fatigue and weight gain. She was put back on Synthroid. Her dose of Synthroid was 88 mcg per day for many years. In January 2006, she had fluctuations in her thyroid function tests. Synthroid was increased to 100 mcg and later to 125 mcg per day. The Synthroid dose was then reduced to 75 mcg. After blood tests about 9 months prior to her first appointment with me, the patient was put back on a dose of 88 mcg per day. When the patient was having fluctuations in her thyroid tests, her thyroid gland became visibly enlarged and felt full and tender to palpation. She was also more hoarse during those periods. Hoarseness came and went in parallel with the thyroid gland swelling. Ultrasound studies of the thyroid in January 2006 and January 2007 showed multinodular goiter.   B. Thyroid function tests performed at that first visit showed a TSH of 4.139, free T4 1.22, and free T3 of 2.7. Because any TSH at that time greater than 3.0 was actually high, I increased her Synthroid from 88 to 100 mcg per day. TPO antibody was markedly positive at 954.0, c/w the diagnosis of Hashimoto's thyroiditis.Marland Kitchen TSI was quite normal at 0.9.    2. During the last 10 years the patient has had several medical issues that we have followed:  A. She has had several flare-ups of Hashimoto's disease, resulting in several changes in her doses of Synthroid. In February 2011 we had to increase her Synthroid dose to 125 mcg per day. Later that year we reduced the dose to 125 mcg 5 days per week and later to 88 mcg/day 6 days per week. In March 2015 I changed her Synthroid dose to 112 mcg 5 days per week and 168 mcg (1.5 of the 112 mg tablets) 2 days per week. She has been taking one Citracal-D tablet per day and 1000 IU of vitamin D per day. Her Synthroid dose was subsequently increased to 112 mcg/day.   B. Although she was initially thought to have a multinodular goiter, over time it became clear that all but one of the nodules were actually areas of echotexture heterogeneity. She has had one "nodularish" area of her left inferior pole that has remained unchanged since 2011. This area is ill-defined and may also just represent several overlapping areas of heterogeneity.  C. She has had problems with fibromyalgia, arthritis, depression, dyspepsia, fatigue, tremor, hyperlipidemia, and dyspepsia.   D. In August 2016 her HbA1c was 6.2%, c/w prediabetes. Follow up HbA1c in October 2016 was 6.1%.   3. The patient's last [facility] visit was on 1010/16. In the interim she has been generally healthy and feels better overall, but has had a recent URI. She continues to have hot spells and  sweats occasionally. She had cortisone shots in both thumb MTP joints recently. Her energy is good enough most of the time. Her depression is much better, but when she has to sort out her deceased sister's personal effects, those experiences are depressing.   A. Fatigue: This problem is about the same. She typically gets about 8 hours of sleep per night and thinks that the quality and duration of her sleeping are good. She is not aware of any significant snoring, although her husband  occasionally complains that she snores.  She is not aware of any acute illnesses, URI symptoms, AGI symptoms, or UTI symptoms.   B. Depression: Her depression is much improved. Her sister, who was also her best friend, died 78 months ago. Shelby Lin is gradually coming to grips with her death.    C. She continues to have daily problems with fibromyalgia and arthritis in her wrists, knees, and feet.   D.  She has not had any more bloody stools or alternating constipation and diarrhea. She has not been having any abdominal pains.   E. Her feet still bother her a lot. Her feet burn in the forefoot area a lot. Steroid injections did not help. The podiatrist wanted to do surgery on her right bunion and right hammer toe. Shelby Lin does not want to have surgery now.   F. Her Meniere's disease of the left ear has not acted up recently.   G. Her COPD is fine as long as she uses her Spiriva daily.    H. She continues to have occasional fronto-temporal headaches, occasionally involving the nuchal area and trapezius areas. Her trapezius areas remain tight and tender at times, but are better overall. The cervical stretching exercises help when she does them.   I. She had a problems with substernal chest pain about 6 weeks prior to her last visit. She saw [name], who told her that it was not her heart. [name] felt that she had a recurrence of esophageal spasm and changed her from omeprazole to Protonix.   J. She still takes Synthroid, one 112 mcg tablet daily. She also takes Protonix and enalapril.   K. The wens on her wrists are much smaller and much less sore.     L. She is intermittently hoarse. The hoarseness comes and goes, now about once a month. The hoarseness may last 12-18 hours. These episodes appear to be related to URIs and allergies.     4. Pertinent Review of Systems:  Constitutional: "I feel all right."  Her stamina and energy are about the same. Some days are better than others.  Eyes: Vision is  good as long when she wears her glasses. She had an eye exam in November 2016. There were no significant eye problems noted.   Neck: The thyroid gland has not seemed to swell lately. The patient has no complaints of anterior neck soreness, tenderness,  pressure, discomfort, or difficulty swallowing.  Heart: She has not had any other chest symptoms. Heart rate increases with exercise or other physical activity. The patient has no other complaints of palpitations, irregular heat beats, chest pain, or chest pressure. Gastrointestinal: As above. Protonix is helping her. Her acid reflux and dyspepsia are better, but can recur if she  eats too much of the wrong foods, such as [place] food.    Legs: Muscle mass and strength seem normal. Legs were stronger when she was walking more. The episodic leg numbness and burning only bother her occasionally. She has not  had any edema recently.   Feet: As above. Her episodic foot pains are about the same.   GYN: She is still off Premarin. Emotional/psychiatric: She feels "good today".   Mental: She does "okay" at thinking, paying attention, remembering, and making decisions. "I have all my marbles."   PAST MEDICAL, FAMILY, AND SOCIAL HISTORY:  Past Medical History  Diagnosis Date  . Hypothyroidism, acquired, autoimmune   . Fibromyalgia syndrome   . Thyroiditis, autoimmune   . Multinodular goiter (nontoxic)   . Tachycardia   . Familial tremor   . Coronary artery spasm ([place])   . Dyspepsia   . Fatigue   . Combined hyperlipidemia   . Dupuytren's contracture   . COPD (chronic obstructive pulmonary disease) ([place])   . GERD (gastroesophageal reflux disease)   . Hypertension   . Wears glasses   . Arthritis     Family History  Problem Relation Age of Onset  . Cancer Mother   . Thyroid disease Neg Hx   . Hypertension Mother   . Hypertension Sister   . Diabetes Sister   . Heart attack Father   . Sudden death Father   . Stroke Mother   . Stroke Sister       Current outpatient prescriptions:  .  carbamazepine (TEGRETOL) 100 MG chewable tablet, Chew 1 tablet (100 mg total) by mouth 2 (two) times daily. After 1 week increase to three times daily if tolerated, Disp: 60 tablet, Rfl: 0 .  Cyanocobalamin (VITAMIN B 12 PO), Take by mouth daily., Disp: , Rfl:  .  DULoxetine (CYMBALTA) 60 MG capsule, Take 60 mg by mouth daily., Disp: , Rfl:  .  enalapril (VASOTEC) 2.5 MG tablet, Take 2.5 mg by mouth daily. , Disp: , Rfl:  .  mesalamine (LIALDA) 1.2 G EC tablet, Take by mouth daily with breakfast. Two pills daily., Disp: , Rfl:  .  Multiple Vitamin (MULTIVITAMIN) tablet, Take 1 tablet by mouth daily.  , Disp: , Rfl:  .  omeprazole (PRILOSEC) 20 MG capsule, Take 20 mg by mouth daily., Disp: , Rfl:  .  predniSONE (DELTASONE) 2.5 MG tablet, Take 2.5 mg by mouth daily with breakfast., Disp: , Rfl:  .  SYNTHROID 112 MCG tablet, TAKE 1 TABLET 5 DAYS A WEEK AND 1 & 1/2 TABLETS 2 DAYS A WEEK, Disp: 35 tablet, Rfl: 1 .  tiotropium (SPIRIVA) 18 MCG inhalation capsule, Place 18 mcg into inhaler and inhale daily., Disp: , Rfl:  .  VITAMIN D, CHOLECALCIFEROL, PO, Take by mouth daily., Disp: , Rfl:   Allergies as of 05/08/2015 - Review Complete 05/08/2015  Allergen Reaction Noted  . Amoxicillin Hives 06/18/2010  . Codeine Nausea And Vomiting 02/21/2013  . Lorabid [loracarbef] Hives 06/18/2010  . Sulfa antibiotics Hives 06/18/2010    1. Work and Family: She retired in June 2013, but later went back to work part-time. She now works on Mondays and Thursdays. She stays fairly busy. Her husband still works 4 days per week.   2. Activities: Physical activities are somewhat restricted due to foot pain and fatigue. She has not been walking much lately. 3. Smoking, alcohol, or drugs: None 4. Primary Care Provider: Dr. Carol Ada, [facility] [place]: There are no other significant problems involving Shelby Lin's other body systems.    Objective:  Vital Signs:  BP 105/72 mmHg  Pulse 92  Wt 150 lb 9.6 oz (68.312 kg) She has lost 5 pounds since last visit.  PHYSICAL EXAMINATION  Constitutional: The patient looks good today. Shelby Lin was very upbeat, chatty, had a good sense of humor, had a normal affect, and had good insight. Even her review of systems is much more upbeat today.   Eyes: There is no obvious arcus or proptosis. Moisture appears normal. Mouth: The oropharynx and tongue appear normal. Oral moisture is normal. There is no oral hyperpigmentation. Neck: The neck appears to be visibly normal. No carotid bruits are noted. The thyroid gland is again within normal limits at about 18 grams in size. The consistency of the thyroid gland is normal. The thyroid gland is not tender to palpation. Her trapezius girdle is still somewhat tight, but is not very tender. She has a 4-5 mm firm nodular lesion in the left trapezius area. The firm area feels like a lipoma.  Lungs: The lungs are clear to auscultation. Air movement is good. Heart: Heart rate and rhythm are regular. Heart sounds S1 and S2 are normal. I did not appreciate any pathologic cardiac murmurs. Abdomen: The abdomen is somewhat enlarged. Bowel sounds are normal. There is no obvious hepatomegaly, splenomegaly, or other mass effect. The abdomen is not tender to palpation. Arms: Muscle size and bulk are normal for age.  Hands: There is 1-2+ tremor. Phalangeal and metacarpophalangeal joints are normal. Palmar muscles are low-normal. Palmar moisture is also normal. There is no palmar hyperpigmentation.  Legs: Muscles appear normal for age. No edema is present. She has pain to palpation in the distal shins. Neurologic: Strength is normal for age in both the upper and lower extremities. Muscle tone is normal. Sensation to touch is normal in the legs.   LAB DATA:   Labs 04/24/15: C-peptide 1.43 (normal 0.80-3.90)  Labs 12/04/14: HbA1c 6.1%  Labs 09/26/14 at 12:01 PM: CMP  normal; HbA1c 6.2%;CBC normal, iron 81; ACTH 21, cortisol 12.9; vitamin B12 705 (normal 211-911)  09/19/14: TSH 1.640, free T4 1.03, free T3 2.6; Calcium 9.3, PTH 33 (increased from 26 in March), 25-OH vitamin D 21 (decreased from 23 in march)  04/28/14: TSH 0.081, free T4 1.47, free T3 3.3; calcium 9.4, PTH 26, 25-OH vitamin D 23  10/27/13: TSH 1.087, free T4 1.04, free T3 2.7; Calcium 9.6, PTH 32, 25-hydroxyvitamin D 34  05/18/13: TSH 0.052, free T4 1.24, free T3 3.2  04/28/13: TSH 0.426, free T4 1.24, free T3 3.2; calcium 9.7, 25-hydroxy vitamin D 36  10/08/12: TSH 2.106, free T4 1.05, free T3 2.6; 25-OH vitamin D 37, PTH 39.4, calcium 9.1  04/02/12: TSH 1.073, free T4 1.31, free T3 2.9, calcium 9.5, 25-hydroxy vitamin D 31 - PTH was ordered but not done. I have re-ordered it this morning.  09/30/11: TSH 8.095, free T4 1.16, free T3 2.4. PTH 24, calcium 9.3, 25-hydroxy vitamin D 33, 1,25-dihydroxy vitamin D 95 11/21/10: Calcium was 9.5. Her PTH was 37.6. 25-hydroxy vitamin D was 41. 1, 25-dihydroxy vitamin D was 85, which was slightly elevated. B12 was 352 (211-911)    IMAGING:   12/07/14 US thyroid: Both lobes are smaller, with maximum dimensions <2.5 cm. Both lobes are heterogenous in echotexture. The previously seen exophytic, solid, hypoechoic, ill-defined nodule in the left inferior pole remains at 0.9 x 0.4 x 0.5 cm and is unchanged from 08/23/2009.   10/12/12: US thyroid gland: Small 10 x 4 x 5 mm nodule or adjacent lymph node or parathyroid adenoma. Diffuse echogenicity. As I read the [place], there is definitely a lot of echogenicity c/w Hashimoto's thyroiditis. There may be a  small nodule that is actually a bit less than one cm in longest dimension. This is unchanged since June 2011.   Assessment and Plan:   ASSESSMENT:  1. Hypothyroid:   A. She has acquired hypothyroidism due to Hashimoto's thyroiditis. She was euthyroid in February and in August 2014, was borderline hyperthyroid and  hyperthyroid on two different occasions 20 days apart in March 2015, but was euthyroid in September 2015. In March 2016, however, she was hyperthyroid again. I consequently reduced her Synthroid dose to 112 mcg/day. It appears that she had another flare up of Hashimoto's disease in March 2016 that caused Hashitoxicosis.   B. In July 2016 she was euthyroid in the middle of the normal range. She was supposed to have had thyroid tests done prior to this visit, but did not.   2. Thyroiditis: The patient's Hashimoto's disease has usually been clinically quiescent, but was subclinically active in the January-March 2016 time period. Her thyroid US in October 2016 showed the heterogeneity c/w Hashimoto's thyroiditis. We'll see if she loses more thyroid cells in the next 2-3 years. 3. Headaches: Headaches appear to be "tension" headaches. They have decreased in both frequency and severity. She needs to perform her cervical stretching exercises daily.   4. Multinodular goiter: Thyroid gland is smaller and is again within normal limits for size today. The waxing and waning of thyroid gland size is c/w evolving Hashimoto's disease. Although some [place] studies have shown a nodule, her [place] study in June 2011 showed either a questionable nodule or demonstrated the echotexture heterogeneity that is commonly seen with Hashimoto's disease. Her [place] studies in August 2014 and October 2016 were essentially unchanged. She does not need a FNA at this time.  5. Hot flashes and sweats: These problems are less frequent.  6-7. Leg pains/vitamin D deficiency disease: Her labs in July showed that her calcium was normal at 9.3, just below the 50%. Her PTH value was mid-range normal. Her vitamin D was lower. She needed more calcium and vitamin D. I had previously asked her to take Citracal-D at doses of 1200-1500 mg of calcium per day and [phone] IU of vitamin D per day. She now takes a MVI and two calcium-D tablets daily. She did not add the  Biotech once weekly capsule as I had recommended.   8. Meniere's disease: Her vertigo has essentially resolved. Her left ear Meniere's disease has not bothered her for months. Her hearing in the left ear varies. It is likely that her environmental allergies are responsible for some serous fluid buildup in the ear. It's possible that she might require further treatment in the future, up to and including surgery. 9. Hoarseness: The fact that her hoarseness and post nasal drip come and go essentially in parallel indicate that the hoarseness is likely due to flare ups of her allergies. However, several flare ups of Hashimoto's disease have also caused hoarseness in the past. 10. Dyspepsia: This problem is fairly well controlled with Protonix. 11. Chest pressure with radiation to her jaws: This problem has resolved. Her cardiac cath was clean. Therefore, her symptoms were most likely due to esophageal spasm.  12. Hypertension: She is doing well with enalapril.  13. Depression: She really looks great today. At this visit and in other recent visits she always looks much better than her Review of Systems would indicate. Today, however, even her ROS is better.  14. Fibromyalgia syndrome: This is her major clinical problem. Exercise will help her "re-charge her batteries".  15. Fatigue: I suspect that her fatigue is due to a combination of fibromyalgia, depression, and getting progressively out of shape. She does not have any clinical stigmata of adrenal insufficiency. Fortunately her cortisol, ACTH, CMP, CBC, iron, and B12 were all normal in August 2016.  16. Prediabetes: Her HbA1c values in July and in October 2016 were elevated into the "pre-diabetes" range. She definitely has pre-diabetes. It appears that she may be following her sister's pattern of developing T2DM at about age 61. The sister controlled her DM through metformin and diet.   PLAN:  1. Diagnostic: Obtain HbA1c, TFTs, calcium, PTH, and 25-OH  vitamin D. 2. Therapeutic: Do her best to continue daily physical activity. Continue Synthroid at current doses. Take Citracal-D twice daily. Consider Biotech, 50,000 IU per week. Consider adding metformin at her next visit.  3. Patient education: We discussed the interrelated pathophysiologies of headaches, Hashimoto's disease, hypothyroidism, fibromyalgia, and fatigue. We also discussed the Eat Right Diet.  4. Follow-up: 3 months  Level of Service: This visit lasted in excess of  minutes. More than 50% of the visit was devoted to counseling.    Sherrlyn Hock

## 2015-05-25 LAB — T4, FREE: Free T4: 1.3 ng/dL (ref 0.8–1.8)

## 2015-05-25 LAB — PTH, INTACT AND CALCIUM
Calcium: 9.4 mg/dL (ref 8.4–10.5)
PTH: 45 pg/mL (ref 14–64)

## 2015-05-25 LAB — HEMOGLOBIN A1C
HEMOGLOBIN A1C: 6.3 % — AB (ref <5.7)
MEAN PLASMA GLUCOSE: 134 mg/dL

## 2015-05-25 LAB — VITAMIN D 25 HYDROXY (VIT D DEFICIENCY, FRACTURES): Vit D, 25-Hydroxy: 19 ng/mL — ABNORMAL LOW (ref 30–100)

## 2015-05-25 LAB — T3, FREE: T3 FREE: 2.8 pg/mL (ref 2.3–4.2)

## 2015-05-25 LAB — TSH: TSH: 1.04 m[IU]/L

## 2015-06-05 ENCOUNTER — Telehealth: Payer: Self-pay | Admitting: *Deleted

## 2015-06-05 NOTE — Telephone Encounter (Signed)
Patient states had lab done a few weeks ago - anxious to get results.

## 2015-06-06 NOTE — Telephone Encounter (Signed)
Routed to provider

## 2015-06-11 NOTE — Telephone Encounter (Signed)
Routed to provider

## 2015-06-11 NOTE — Telephone Encounter (Signed)
Patient called again requesting results. She is anxious for these.

## 2015-06-11 NOTE — Telephone Encounter (Signed)
Routed to provider, 2nd time

## 2015-06-12 ENCOUNTER — Telehealth: Payer: Self-pay | Admitting: "Endocrinology

## 2015-06-12 NOTE — Telephone Encounter (Signed)
Routed To [name].

## 2015-06-12 NOTE — Telephone Encounter (Signed)
1. Ms Berendsen had called asking to discuss her recent lab results. 2.  Subjective: She is still taking one MVI per day and one vitamin D pill per day. 3.  Objective:   A. Thyroid blood tests, calcium, and PTH were normal.  B. HbA1c was a bit higher at 6.3%.  C. Vitamin D level was low at 19. 4. Assessment/Plan:  A.Her prediabetes is a bit worse. She needs to exercise more and consume fewer carbs.  B. Her Vitamin D level is too low. She needs to order Biotech brand of vitamin D, one 50,000 IU capsule per week.  C. Follow up visit as planned. Sherrlyn Hock

## 2015-08-14 ENCOUNTER — Encounter: Payer: Self-pay | Admitting: *Deleted

## 2015-08-14 ENCOUNTER — Ambulatory Visit (INDEPENDENT_AMBULATORY_CARE_PROVIDER_SITE_OTHER): Payer: Medicare Other | Admitting: "Endocrinology

## 2015-08-14 ENCOUNTER — Encounter: Payer: Self-pay | Admitting: "Endocrinology

## 2015-08-14 VITALS — BP 132/74 | HR 94 | Wt 150.6 lb

## 2015-08-14 DIAGNOSIS — E063 Autoimmune thyroiditis: Secondary | ICD-10-CM | POA: Diagnosis not present

## 2015-08-14 DIAGNOSIS — M797 Fibromyalgia: Secondary | ICD-10-CM

## 2015-08-14 DIAGNOSIS — E038 Other specified hypothyroidism: Secondary | ICD-10-CM

## 2015-08-14 DIAGNOSIS — R5383 Other fatigue: Secondary | ICD-10-CM

## 2015-08-14 DIAGNOSIS — E042 Nontoxic multinodular goiter: Secondary | ICD-10-CM

## 2015-08-14 DIAGNOSIS — M1990 Unspecified osteoarthritis, unspecified site: Secondary | ICD-10-CM

## 2015-08-14 DIAGNOSIS — R1013 Epigastric pain: Secondary | ICD-10-CM

## 2015-08-14 DIAGNOSIS — I10 Essential (primary) hypertension: Secondary | ICD-10-CM

## 2015-08-14 DIAGNOSIS — R7303 Prediabetes: Secondary | ICD-10-CM

## 2015-08-14 DIAGNOSIS — M25579 Pain in unspecified ankle and joints of unspecified foot: Secondary | ICD-10-CM

## 2015-08-14 DIAGNOSIS — E049 Nontoxic goiter, unspecified: Secondary | ICD-10-CM

## 2015-08-14 DIAGNOSIS — E559 Vitamin D deficiency, unspecified: Secondary | ICD-10-CM

## 2015-08-14 LAB — POCT GLYCOSYLATED HEMOGLOBIN (HGB A1C): Hemoglobin A1C: 5.9

## 2015-08-14 LAB — GLUCOSE, POCT (MANUAL RESULT ENTRY): POC Glucose: 124 mg/dl — AB (ref 70–99)

## 2015-08-14 NOTE — Progress Notes (Signed)
Subjective:  Patient Name: Shelby Lin Date of Birth: 1940-06-04  MRN: [id number]  Shelby Lin  presents to the office today for follow-up of her hypothyroidism secondary to Hashimoto's disease, questionable nodular goiter, tachycardia, tremor, dyspepsia, fatigue, fibromyalgia, hyperlipidemia, night sweats, tremor, and pre-diabetes.  HISTORY OF PRESENT ILLNESS:   Shelby Lin is a 75 y.o. Caucasian woman.  Shelby Lin was unaccompanied.  1. Shelby Lin was referred to me on 03/25/2005 by her primary care provider, Shelby Lin, for evaluation of her thyroid problems. Shelby Lin was 23 at that time.   A. At about age 59 the patient developed thyroid problems and was put on a thyroid medicine. Later when she became pregnant her obstetrician took her off that medicine. At about age 33, she developed severe fatigue and weight gain. She was put back on Synthroid. Her dose of Synthroid was 88 mcg per day for many years. In January 2006, she had fluctuations in her thyroid function tests. Synthroid was increased to 100 mcg and later to 125 mcg per day. The Synthroid dose was then reduced to 75 mcg. After blood tests about 9 months prior to her first appointment with me, the patient was put back on a dose of 88 mcg per day. When the patient was having fluctuations in her thyroid tests, her thyroid gland became visibly enlarged and felt full and tender to palpation. She was also more hoarse during those periods. Hoarseness came and went in parallel with the thyroid gland swelling. Ultrasound studies of the thyroid in January 2006 and January 2007 showed multinodular goiter.   B. Thyroid function tests performed at that first visit showed a TSH of 4.139, free T4 1.22, and free T3 of 2.7. Because any TSH at that time greater than 3.0 was actually high, I increased her Synthroid from 88 to 100 mcg per day. TPO antibody was markedly positive at 954.0, c/w the diagnosis of Hashimoto's thyroiditis.Marland Kitchen TSI was quite normal at 0.9.    2. During the last 10 years the patient has had several medical issues that we have followed:  A. She has had several flare-ups of Hashimoto's disease, resulting in several changes in her doses of Synthroid. In February 2011 we had to increase her Synthroid dose to 125 mcg per day. Later that year we reduced the dose to 125 mcg 5 days per week and later to 88 mcg/day 6 days per week. In March 2015 I changed her Synthroid dose to 112 mcg 5 days per week and 168 mcg (1.5 of the 112 mg tablets) 2 days per week. She had been taking one Citracal-D tablet per day and 1000 IU of vitamin D per day. Her Synthroid dose was subsequently increased to 112 mcg/day.   B. Although she was initially thought to have a multinodular goiter, over time it became clear that all but one of the nodules were actually areas of echotexture heterogeneity. She has had one "nodularish" area of her left inferior pole that has remained unchanged since 2011. This area is ill-defined and may also just represent several overlapping areas of heterogeneity.  C. She has had problems with fibromyalgia, arthritis, depression, dyspepsia, fatigue, tremor, hyperlipidemia, and dyspepsia.   D. In August 2016 her HbA1c was 6.2%, c/w prediabetes. Follow up HbA1c in October 2016 was 6.1%. The patient refuses to take metformin because three of her relatives lost their scalp hair when they took metformin.    3. The patient's last [facility] visit was on 05/08/15. In the interim. "I've been  better."  A. "I just don't feel good. My feet hurt all the time. I can't walk much anymore due to the throbbing pain in my feet. My hands (PIP joints) stay sore. I continue to be very tired. I still get dizzy at times."  She says the dizziness is an unsteady feeling that she gets at times, not spinning, and not lightheaded as might occur if she stands up too soon. She has not had many hot spells or sweats. She had cortisone shots in both thumb MTP joints just prior to her last  visit that did help.   B. Her energy is variable, but often too low. Her depression is "fair".  She still has some family problems that she is dealing with.   C. Fatigue: This problem is worse some days. She typically gets about 8 hours of sleep per night and thinks that the quality and duration of her sleeping are good. She is not aware of any significant snoring, although her husband occasionally complains that she snores.  She is not aware of any acute illnesses, URI symptoms, AGI symptoms, or UTI symptoms.   D. She continues to have daily problems with fibromyalgia and arthritis in her wrists, hands, knees, and feet.   E.  She has not had any more bloody stools or alternating constipation and diarrhea. She has not been having any abdominal pains.   F. Her feet still bother her a lot. Her feet burn in the forefoot area a lot. Steroid injections did not help. The podiatrist wanted to do surgery on her right bunion and right hammer toe. Shelby Lin does not want to have surgery now.   G. Her Meniere's disease of the left ear has not acted up recently. She still has "some ringing and roaring once in a while".  H. Her COPD is fine as long as she uses her Spiriva daily.    I. She continues to have occasional fronto-temporal headaches, occasionally involving the nuchal area and trapezius areas. Her trapezius areas remain tight and tender at times, but are better overall. The cervical stretching exercises help when she does them.   J. She occasionally has a problems with substernal chest pain in the evenings. If she takes Tums or a soda the symptoms resolve. She remains on her Protonix daily.   K. She still takes Synthroid, one 112 mcg tablet daily. She also takes enalapril and Biotech vitamin D, 50,000 IU/day. Shelby Lin recently started [place] on Crestor, 5 mg/day.  L. The wens on her wrists are much smaller and much less sore.     M. She is intermittently hoarse. The hoarseness comes and goes, now about once a  month. The hoarseness may last 12-18 hours. These episodes appear to be related to URIs and allergies.     4. Pertinent Review of Systems:  Constitutional: "I feel sluggish and not very good."  Her stamina and energy are about the same. Some days are better than others.  Eyes: Vision is good as long as she wears her glasses. She had an eye exam in November 2016. There were no significant eye problems noted.   Neck: The thyroid gland has not seemed to swell lately. The patient has no complaints of anterior neck soreness, tenderness,  pressure, discomfort, or difficulty swallowing.  Heart: She has not had any other chest symptoms. Heart rate increases with exercise or other physical activity. The patient has no other complaints of palpitations, irregular heat beats, chest pain, or chest pressure. Gastrointestinal: As  above. Protonix is helping her. Her acid reflux and dyspepsia are better, but can recur if she  eats too much of the wrong foods, such as [place] food.    Legs: Muscle mass and strength seem normal. Legs were stronger when she was walking more. The episodic leg numbness and burning only bother her occasionally. She has not had any edema recently.   Feet: As above. Her episodic foot pains are worse.   GYN: She is still off Premarin. Emotional/psychiatric: She feels "okay today".  She is having problems with her children.  Mental: She does "okay" at thinking, paying attention, remembering, and making decisions. "I have all my marbles."   PAST MEDICAL, FAMILY, AND SOCIAL HISTORY:  Past Medical History  Diagnosis Date  . Hypothyroidism, acquired, autoimmune   . Fibromyalgia syndrome   . Thyroiditis, autoimmune   . Multinodular goiter (nontoxic)   . Tachycardia   . Familial tremor   . Coronary artery spasm ([place])   . Dyspepsia   . Fatigue   . Combined hyperlipidemia   . Dupuytren's contracture   . COPD (chronic obstructive pulmonary disease) ([place])   . GERD (gastroesophageal reflux  disease)   . Hypertension   . Wears glasses   . Arthritis     Family History  Problem Relation Age of Onset  . Cancer Mother   . Thyroid disease Neg Hx   . Hypertension Mother   . Hypertension Sister   . Diabetes Sister   . Heart attack Father   . Sudden death Father   . Stroke Mother   . Stroke Sister      Current outpatient prescriptions:  .  carbamazepine (TEGRETOL) 100 MG chewable tablet, Chew 1 tablet (100 mg total) by mouth 2 (two) times daily. After 1 week increase to three times daily if tolerated, Disp: 60 tablet, Rfl: 0 .  Cyanocobalamin (VITAMIN B 12 PO), Take by mouth daily., Disp: , Rfl:  .  DULoxetine (CYMBALTA) 60 MG capsule, Take 60 mg by mouth daily., Disp: , Rfl:  .  enalapril (VASOTEC) 2.5 MG tablet, Take 2.5 mg by mouth daily. , Disp: , Rfl:  .  mesalamine (LIALDA) 1.2 G EC tablet, Take by mouth daily with breakfast. Two pills daily., Disp: , Rfl:  .  Multiple Vitamin (MULTIVITAMIN) tablet, Take 1 tablet by mouth daily.  , Disp: , Rfl:  .  omeprazole (PRILOSEC) 20 MG capsule, Take 20 mg by mouth daily., Disp: , Rfl:  .  predniSONE (DELTASONE) 2.5 MG tablet, Take 2.5 mg by mouth daily with breakfast., Disp: , Rfl:  .  rosuvastatin (CRESTOR) 5 MG tablet, Take 5 mg by mouth daily., Disp: , Rfl:  .  SYNTHROID 112 MCG tablet, TAKE 1 TABLET 5 DAYS A WEEK AND 1 & 1/2 TABLETS 2 DAYS A WEEK, Disp: 35 tablet, Rfl: 1 .  tiotropium (SPIRIVA) 18 MCG inhalation capsule, Place 18 mcg into inhaler and inhale daily., Disp: , Rfl:  .  VITAMIN D, CHOLECALCIFEROL, PO, Take by mouth daily., Disp: , Rfl:   Allergies as of 08/14/2015 - Review Complete 05/08/2015  Allergen Reaction Noted  . Amoxicillin Hives 06/18/2010  . Codeine Nausea And Vomiting 02/21/2013  . Lorabid [loracarbef] Hives 06/18/2010  . Sulfa antibiotics Hives 06/18/2010    1. Work and Family: She retired in June 2013, but later went back to work part-time. She still works on Mondays and Thursdays. She stays  fairly busy. Her husband still works 4 days per week.   2.  Activities: Physical activities are somewhat restricted due to foot pain and fatigue. She has not been walking much lately. 3. Smoking, alcohol, or drugs: None 4. Primary Care Provider: Dr. Carol Lin, [facility] [place]: There are no other significant problems involving Shelby Lin's other body systems.   Objective:  Vital Signs:  BP 132/74 mmHg  Pulse 94  Wt 150 lb 9.6 oz (68.312 kg)  BPs at home are similar.  PHYSICAL EXAMINATION  Constitutional: The patient looks tired today. Shelby Lin was very alert, bright, chatty, had a good sense of humor, had a normal affect, and had good insight. Her weight is unchanged. Eyes: There is no obvious arcus or proptosis. Moisture appears normal. Mouth: The oropharynx and tongue appear normal. Oral moisture is normal. There is no oral hyperpigmentation. Neck: The neck appears to be visibly normal. No carotid bruits are noted. The thyroid gland is slightly larger, but is still within normal limits at about 20 grams in size. The left lobe is larger than the right. The consistency of the thyroid gland is normal. The thyroid gland is not tender to palpation. Lungs: The lungs are clear to auscultation. Air movement is good. Heart: Heart rate and rhythm are regular. Heart sounds S1 and S2 are normal. I did not appreciate any pathologic cardiac murmurs. Abdomen: The abdomen is somewhat enlarged. Bowel sounds are normal. There is no obvious hepatomegaly, splenomegaly, or other mass effect. The abdomen is not tender to palpation. Arms: Muscle size and bulk are normal for age.  Hands: There is 1-2+ tremor. Phalangeal and metacarpophalangeal joints are normal. Palmar muscles are low-normal. Palmar moisture is also normal. There is 1+ palmar hyperpigmentation.  Legs: Muscles appear normal for age. No edema is present. She has pain to palpation in the distal shins. Neurologic:  Strength is normal for age in both the upper and lower extremities. Muscle tone is normal. Sensation to touch is normal in the legs.   LAB DATA:   Labs 08/14/15: HbA1c 5.8%  Labs 04/27/15: HbA1c 6.3%; TSH 1.04, free T4 1.3, free T3 2.8; PTH 45, calcium 9.4, 25-OH vitamin D 19  Labs 04/24/15: C-peptide 1.43 (normal 0.80-3.90)  Labs 12/04/14: HbA1c 6.1%  Labs 09/26/14 at 12:01 PM: CMP normal; HbA1c 6.2%;CBC normal, iron 81; ACTH 21, cortisol 12.9; vitamin B12 705 (normal 211-911)  09/19/14: TSH 1.640, free T4 1.03, free T3 2.6; Calcium 9.3, PTH 33 (increased from 26 in March), 25-OH vitamin D 21 (decreased from 23 in march)  04/28/14: TSH 0.081, free T4 1.47, free T3 3.3; calcium 9.4, PTH 26, 25-OH vitamin D 23  10/27/13: TSH 1.087, free T4 1.04, free T3 2.7; Calcium 9.6, PTH 32, 25-hydroxyvitamin D 34  05/18/13: TSH 0.052, free T4 1.24, free T3 3.2  04/28/13: TSH 0.426, free T4 1.24, free T3 3.2; calcium 9.7, 25-hydroxy vitamin D 36  10/08/12: TSH 2.106, free T4 1.05, free T3 2.6; 25-OH vitamin D 37, PTH 39.4, calcium 9.1  04/02/12: TSH 1.073, free T4 1.31, free T3 2.9, calcium 9.5, 25-hydroxy vitamin D 31 - PTH was ordered but not done. I have re-ordered it this morning.  09/30/11: TSH 8.095, free T4 1.16, free T3 2.4. PTH 24, calcium 9.3, 25-hydroxy vitamin D 33, 1,25-dihydroxy vitamin D 95 11/21/10: Calcium was 9.5. Her PTH was 37.6. 25-hydroxy vitamin D was 41. 1, 25-dihydroxy vitamin D was 85, which was slightly elevated. B12 was 352 (211-911)    IMAGING:   12/07/14 US thyroid: Both lobes are smaller, with  maximum dimensions <2.5 cm. Both lobes are heterogenous in echotexture. The previously seen exophytic, solid, hypoechoic, ill-defined nodule in the left inferior pole remains at 0.9 x 0.4 x 0.5 cm and is unchanged from 08/23/2009.   10/12/12: US thyroid gland: Small 10 x 4 x 5 mm nodule or adjacent lymph node or parathyroid adenoma. Diffuse echogenicity. As I read the [place], there is  definitely a lot of echogenicity c/w Hashimoto's thyroiditis. There may be a small nodule that is actually a bit less than one cm in longest dimension. This is unchanged since June 2011.   Assessment and Plan:   ASSESSMENT:  1. Hypothyroid:   A. She has acquired hypothyroidism due to Hashimoto's thyroiditis. She was euthyroid in February and in August 2014, was borderline hyperthyroid and hyperthyroid on two different occasions 20 days apart in March 2015, but was euthyroid in September 2015. In March 2016, however, she was hyperthyroid again. I consequently reduced her Synthroid dose to 112 mcg/day.  It appears that she had another flare up of Hashimoto's disease in March 2016 that caused Hashitoxicosis.   B. In July 2016 she was euthyroid in the middle of the normal range. She was also euthyroid in March 2017.   2. Thyroiditis: The patient's Hashimoto's disease has usually been clinically quiescent, but was subclinically active in the January-March 2016 time period. Her thyroid US in October 2016 showed the heterogeneity c/w Hashimoto's thyroiditis. We'll see if she loses more thyroid cells in the next 2-3 years. 3. Headaches: Headaches appear to be "tension" headaches. They have decreased in both frequency and severity. She needs to perform her cervical stretching exercises daily.   4. Multinodular goiter: Thyroid gland is smaller and is again within normal limits for size today. The waxing and waning of thyroid gland size is c/w evolving Hashimoto's disease. Although some [place] studies have shown a nodule, her [place] study in June 2011 showed either a questionable nodule or demonstrated the echotexture heterogeneity that is commonly seen with Hashimoto's disease. Her [place] studies in August 2014 and October 2016 were essentially unchanged. She does not need a FNA at this time.  5. Hot flashes and sweats: These problems are less frequent.  6-7. Leg pains/vitamin D deficiency disease:   A. Her labs in July  2016 showed that her calcium was normal at 9.3, just below the 50%. Her PTH value was mid-range normal. Her vitamin D was lower. She needed more calcium and vitamin D. I had previously asked her to take Citracal-D at doses of 1200-1500 mg of calcium per day and [phone] IU of vitamin D per day.   B. Her labs in March 2017 showed that her PTH value of 45 was higher, but still mid-normal. Calcium was a bit higher at 9.4. Vitamin D was lower at 19. At that point she agreed to purchase and take the Biotech form of vitamin D, one 50,000 IU capsule each week.   C. She now takes a MVI daily and Biotech vitamin D, 50,000 units each week.    8. Meniere's disease: Her vertigo has essentially resolved. Her left ear Meniere's disease has not bothered her for months. Her hearing in the left ear varies. It is likely that her environmental allergies are responsible for some serous fluid buildup in the ear. It's possible that she might require further treatment in the future, up to and including surgery. 9. Hoarseness: The fact that her hoarseness and post nasal drip come and go essentially in parallel indicate that the  hoarseness is likely due to flare ups of her allergies. However, several flare ups of Hashimoto's disease have also caused hoarseness in the past. 10. Dyspepsia: This problem is fairly well controlled with Protonix. 11. Chest pressure with radiation to her jaws: This problem has resolved. Her cardiac cath was clean. Therefore, her symptoms were most likely due to esophageal spasm.  12. Hypertension: She is doing well with enalapril.  13. Depression: She really looks fairly good today. She has finally come to grips with her sister's death.  14. Fibromyalgia syndrome: This problem remains a major problem for her. Exercise will help her "re-charge her batteries".  15. Fatigue: I suspect that her fatigue is due to a combination of fibromyalgia, depression, and getting progressively out of shape. She does not  have any clinical stigmata of adrenal insufficiency. Fortunately her cortisol, ACTH, CMP, CBC, iron, and B12 were all normal in August 2016.  16. Prediabetes: Her HbA1c values in July and in October 2016 were elevated into the "pre-diabetes" range. Her HbA1c in March 2017 was higher. Her HbA1c today was lower, but still in the prediabetes range. It appears that she may be following her sister's pattern of developing T2DM at about age 53. The sister controlled her DM through metformin and diet. Since Shelby Lin's foot pain is severe enough to limit her walking, she needs to try other exercise such as glide machine, bike, or water aerobics.   17. Arthritis: I think that Shelby Lin should be referred to the rheumatologist of Dr. Thompson Caul choosing in order to determine if any physical therapies or adjunctive therapies will help her.   PLAN:  1. Diagnostic: Reviewed HbA1c result. Obtain TFTs, CMP,  calcium, PTH, and 25-OH vitamin D now. 2. Therapeutic: Do her best to continue daily physical activity. Watch what she eats and drinks. Continue Synthroid at current doses. Take MVI daily and Biotech, 50,000 IU per week.  3. Patient education: We discussed the interrelated pathophysiologies of headaches, Hashimoto's disease, hypothyroidism, fibromyalgia, and fatigue. We also discussed the Eat Right Diet.  4. Follow-up: 6 months  Level of Service: This visit lasted in excess of 55 minutes. More than 50% of the visit was devoted to counseling.    Sherrlyn Hock, MD, CDE Adult and Pediatric Endocrinology

## 2015-08-14 NOTE — Patient Instructions (Addendum)
Follow up visit in 6 months. 

## 2015-08-24 LAB — COMPREHENSIVE METABOLIC PANEL
ALBUMIN: 4.2 g/dL (ref 3.6–5.1)
ALK PHOS: 97 U/L (ref 33–130)
ALT: 10 U/L (ref 6–29)
AST: 15 U/L (ref 10–35)
BILIRUBIN TOTAL: 0.5 mg/dL (ref 0.2–1.2)
BUN: 18 mg/dL (ref 7–25)
CALCIUM: 9.7 mg/dL (ref 8.6–10.4)
CO2: 30 mmol/L (ref 20–31)
CREATININE: 0.67 mg/dL (ref 0.60–0.93)
Chloride: 100 mmol/L (ref 98–110)
Glucose, Bld: 102 mg/dL — ABNORMAL HIGH (ref 70–99)
Potassium: 4.7 mmol/L (ref 3.5–5.3)
SODIUM: 138 mmol/L (ref 135–146)
TOTAL PROTEIN: 6.5 g/dL (ref 6.1–8.1)

## 2015-08-24 LAB — PTH, INTACT AND CALCIUM
Calcium: 9.7 mg/dL (ref 8.4–10.5)
PTH: 25 pg/mL (ref 14–64)

## 2015-08-24 LAB — VITAMIN D 25 HYDROXY (VIT D DEFICIENCY, FRACTURES): Vit D, 25-Hydroxy: 64 ng/mL (ref 30–100)

## 2015-08-24 LAB — T4, FREE: Free T4: 1.8 ng/dL (ref 0.8–1.8)

## 2015-08-24 LAB — T3, FREE: T3 FREE: 3.3 pg/mL (ref 2.3–4.2)

## 2015-08-24 LAB — TSH: TSH: 0.06 mIU/L — ABNORMAL LOW

## 2015-10-04 ENCOUNTER — Other Ambulatory Visit: Payer: Self-pay | Admitting: Family Medicine

## 2015-10-04 DIAGNOSIS — G44319 Acute post-traumatic headache, not intractable: Secondary | ICD-10-CM

## 2015-10-05 ENCOUNTER — Ambulatory Visit
Admission: RE | Admit: 2015-10-05 | Discharge: 2015-10-05 | Disposition: A | Discharge status: Home / Self Care | Payer: Medicare Other | Source: Ambulatory Visit | Attending: Family Medicine | Admitting: Family Medicine

## 2015-10-05 DIAGNOSIS — G44319 Acute post-traumatic headache, not intractable: Secondary | ICD-10-CM

## 2015-11-11 ENCOUNTER — Telehealth: Payer: Self-pay | Admitting: "Endocrinology

## 2015-11-11 DIAGNOSIS — E039 Hypothyroidism, unspecified: Secondary | ICD-10-CM

## 2015-11-11 NOTE — Telephone Encounter (Signed)
1. I called the patient to discuss her last lab test results.  2. Subjective: She stays tired.  3. Objective: The TFTs were high. Her other labs were good. 3. Assessment: I can't be sure whether her TFTs were high due to a flare up of thyroiditis or her liver may not be metabolizing her Synthroid 112 mcg tablets as well as previously. We need to repeat her TFTs before deciding on whether or not to change her Synthroid dose.  4. Plan: Repeat TFTs this week. Sherrlyn Hock

## 2015-11-12 LAB — T3, FREE: T3, Free: 3.6 pg/mL (ref 2.3–4.2)

## 2015-11-12 LAB — T4, FREE: Free T4: 1.5 ng/dL (ref 0.8–1.8)

## 2015-11-12 LAB — TSH: TSH: 0.05 mIU/L — ABNORMAL LOW

## 2015-11-13 ENCOUNTER — Encounter: Payer: Self-pay | Admitting: *Deleted

## 2015-11-14 ENCOUNTER — Telehealth: Payer: Self-pay

## 2015-11-14 NOTE — Telephone Encounter (Signed)
Routed to provider

## 2015-11-14 NOTE — Telephone Encounter (Signed)
Patient asking about lab work and stated that her thyroid medication is almost out and will need a refill and also wanted to know if based on her results will provider change the dosage.

## 2015-12-11 ENCOUNTER — Other Ambulatory Visit: Payer: Self-pay | Admitting: Family Medicine

## 2015-12-11 DIAGNOSIS — Z1231 Encounter for screening mammogram for malignant neoplasm of breast: Secondary | ICD-10-CM

## 2015-12-28 ENCOUNTER — Ambulatory Visit
Admission: RE | Admit: 2015-12-28 | Discharge: 2015-12-28 | Disposition: A | Discharge status: Home / Self Care | Payer: Medicare Other | Source: Ambulatory Visit | Attending: Family Medicine | Admitting: Family Medicine

## 2015-12-28 DIAGNOSIS — Z1231 Encounter for screening mammogram for malignant neoplasm of breast: Secondary | ICD-10-CM

## 2016-01-01 ENCOUNTER — Other Ambulatory Visit: Payer: Self-pay | Admitting: Family Medicine

## 2016-01-01 DIAGNOSIS — N6489 Other specified disorders of breast: Secondary | ICD-10-CM

## 2016-01-02 ENCOUNTER — Ambulatory Visit
Admission: RE | Admit: 2016-01-02 | Discharge: 2016-01-02 | Disposition: A | Discharge status: Home / Self Care | Payer: Medicare Other | Source: Ambulatory Visit | Attending: Family Medicine | Admitting: Family Medicine

## 2016-01-02 DIAGNOSIS — N6489 Other specified disorders of breast: Secondary | ICD-10-CM

## 2016-02-01 ENCOUNTER — Other Ambulatory Visit: Payer: Self-pay | Admitting: Family Medicine

## 2016-02-01 DIAGNOSIS — M8580 Other specified disorders of bone density and structure, unspecified site: Secondary | ICD-10-CM

## 2016-02-05 ENCOUNTER — Ambulatory Visit (INDEPENDENT_AMBULATORY_CARE_PROVIDER_SITE_OTHER): Payer: Self-pay | Admitting: "Endocrinology

## 2016-02-12 ENCOUNTER — Ambulatory Visit
Admission: RE | Admit: 2016-02-12 | Discharge: 2016-02-12 | Disposition: A | Discharge status: Home / Self Care | Payer: Medicare Other | Source: Ambulatory Visit | Attending: Family Medicine | Admitting: Family Medicine

## 2016-02-12 DIAGNOSIS — M8580 Other specified disorders of bone density and structure, unspecified site: Secondary | ICD-10-CM

## 2016-02-22 ENCOUNTER — Encounter (INDEPENDENT_AMBULATORY_CARE_PROVIDER_SITE_OTHER): Payer: Self-pay | Admitting: "Endocrinology

## 2016-02-22 ENCOUNTER — Ambulatory Visit (INDEPENDENT_AMBULATORY_CARE_PROVIDER_SITE_OTHER): Payer: Medicare Other | Admitting: "Endocrinology

## 2016-02-22 VITALS — BP 132/82 | HR 80 | Wt 152.0 lb

## 2016-02-22 DIAGNOSIS — E049 Nontoxic goiter, unspecified: Secondary | ICD-10-CM

## 2016-02-22 DIAGNOSIS — M25561 Pain in right knee: Secondary | ICD-10-CM | POA: Diagnosis not present

## 2016-02-22 DIAGNOSIS — E038 Other specified hypothyroidism: Secondary | ICD-10-CM | POA: Diagnosis not present

## 2016-02-22 DIAGNOSIS — R7303 Prediabetes: Secondary | ICD-10-CM

## 2016-02-22 DIAGNOSIS — R5383 Other fatigue: Secondary | ICD-10-CM | POA: Diagnosis not present

## 2016-02-22 DIAGNOSIS — I10 Essential (primary) hypertension: Secondary | ICD-10-CM

## 2016-02-22 DIAGNOSIS — E559 Vitamin D deficiency, unspecified: Secondary | ICD-10-CM | POA: Diagnosis not present

## 2016-02-22 DIAGNOSIS — E063 Autoimmune thyroiditis: Secondary | ICD-10-CM | POA: Diagnosis not present

## 2016-02-22 DIAGNOSIS — M25562 Pain in left knee: Secondary | ICD-10-CM | POA: Diagnosis not present

## 2016-02-22 DIAGNOSIS — F3289 Other specified depressive episodes: Secondary | ICD-10-CM

## 2016-02-22 LAB — POCT GLYCOSYLATED HEMOGLOBIN (HGB A1C): Hemoglobin A1C: 6.4

## 2016-02-22 LAB — GLUCOSE, POCT (MANUAL RESULT ENTRY): POC GLUCOSE: 104 mg/dL — AB (ref 70–99)

## 2016-02-22 NOTE — Patient Instructions (Signed)
Follow up visit in 3 months. 

## 2016-02-22 NOTE — Progress Notes (Signed)
Subjective:  Patient Name: Shelby Lin Date of Birth: [birth year]  MRN: [id number]  Shelby Lin  presents to the office today for follow-up of her hypothyroidism secondary to Hashimoto's disease, questionable nodular goiter, tachycardia, tremor, dyspepsia, fatigue, fibromyalgia, hyperlipidemia, night sweats, tremor, and pre-diabetes.  HISTORY OF PRESENT ILLNESS:   Shelby Lin is a 75 y.o. Caucasian woman.  Shelby Lin was unaccompanied.  1. Shelby Lin was referred to me on 03/25/2005 by her primary care provider, Dr. Carol Ada, for evaluation of her thyroid problems. Shelby Lin was 46 at that time.   A. At about age 78 the patient developed thyroid problems and was put on a thyroid medicine. Later when she became pregnant her obstetrician took her off that medicine. At about age 89, she developed severe fatigue and weight gain. She was put back on Synthroid. Her dose of Synthroid was 88 mcg per day for many years. In January 2006, she had fluctuations in her thyroid function tests. Synthroid was increased to 100 mcg and later to 125 mcg per day. The Synthroid dose was then reduced to 75 mcg. After blood tests about 9 months prior to her first appointment with me, the patient was put back on a dose of 88 mcg per day. When the patient was having fluctuations in her thyroid tests, her thyroid gland became visibly enlarged and felt full and tender to palpation. She was also more hoarse during those periods. Hoarseness came and went in parallel with the thyroid gland swelling. Ultrasound studies of the thyroid in January 2006 and January 2007 showed multinodular goiter.   B. Thyroid function tests performed at that first visit showed a TSH of 4.139, free T4 1.22, and free T3 of 2.7. Because any TSH at that time greater than 3.0 was actually high, I increased her Synthroid from 88 to 100 mcg per day. TPO antibody was markedly positive at 954.0, c/w the diagnosis of Hashimoto's thyroiditis.Marland Kitchen TSI was quite normal at 0.9.    2. During the last 10 years the patient has had several medical issues that we have followed:  A. She has had several flare-ups of Hashimoto's disease, resulting in several changes in her doses of Synthroid. In February 2011 we had to increase her Synthroid dose to 125 mcg per day. Later that year we reduced the dose to 125 mcg 5 days per week and later to 88 mcg/day 6 days per week. In March 2015 I changed her Synthroid dose to 112 mcg 5 days per week and 168 mcg (1.5 of the 112 mg tablets) 2 days per week. She had been taking one Citracal-D tablet per day and 1000 IU of vitamin D per day. Her Synthroid dose was subsequently increased to 112 mcg/day.   B. Although she was initially thought to have a multinodular goiter, over time it became clear that all but one of the nodules were actually areas of echotexture heterogeneity. She has had one "nodularish" area of her left inferior pole that has remained unchanged since 2011. This area is ill-defined and may also just represent several overlapping areas of heterogeneity.  C. She has had problems with fibromyalgia, arthritis, depression, dyspepsia, fatigue, tremor, hyperlipidemia, and dyspepsia.   D. In August 2016 her HbA1c was 6.2%, c/w prediabetes. Follow up HbA1c in October 2016 was 6.1%. The patient refuses to take metformin because three of her relatives lost their scalp hair when they took metformin.    3. The patient's last [facility] visit was on 08/14/15. In the interim she has  been healthy, except for recurrent tension headaches. She does better when she does her cervical stretching exercises regularly. She has not had any new health problems.   A. "I feel alright."  She still has episodic fatigue. She has not been bothered by dizziness recently. She still has hot spells or sweats occasionally.   B. Her energy is variable, overall better, but still sometimes too low. Her depression is "better".  She still has some family problems that she is dealing  with, but not as bad.   C. She continues to have daily problems with fibromyalgia and arthritis in her wrists, hands, knees, and feet.   D.  She has not had any more bloody stools or alternating constipation and diarrhea. She has not been having any abdominal pains.   E. Her feet still bother her a lot. Her feet get red and feel hot in the forefoot area a lot.    F. Her Meniere's disease of the left ear has not acted up recently. She still has "some ringing and roaring once in a while".  G. Her COPD is fine as long as she uses her Spiriva daily.    H. She has not had any recent problems with substernal chest pain in the evenings.  She remains on her Protonix daily.   I. She still takes Synthroid, one 112 mcg tablet daily for 6 days each week, but skips Sundays. She also takes enalapril, Crestor 5 mg/day, and Biotech vitamin D, 50,000 IU/day.   J. The wens on her wrists are much smaller and much less sore.     K. She is intermittently hoarse. The hoarseness comes and goes, now about once a month. The hoarseness may last 12-18 hours. These episodes are probably related to URIs and allergies.     4. Pertinent Review of Systems:  Constitutional: "I feel alright."  Her stamina and energy are somewhat better, but some days are better than others.  Eyes: Vision is good as long as she wears her glasses. She had an eye exam earlier this Fall. There were no significant eye problems noted.   Neck: The thyroid gland has not seemed to swell lately. The patient has no complaints of anterior neck soreness, tenderness,  pressure, discomfort, or difficulty swallowing.  Heart: She has not had any other chest symptoms. Heart rate increases with exercise or other physical activity. The patient has no other complaints of palpitations, irregular heat beats, chest pain, or chest pressure. Gastrointestinal: As above. Protonix is helping her. Her acid reflux and dyspepsia have essentially resolved.     Legs: Muscle mass and  strength seem normal. Legs are stronger when she walks more. She no longer has episodic leg numbness and burning. She has not had any edema recently.   Feet: Her feet still burn in the forefeet at times.    GYN: She is still off Premarin. Emotional/psychiatric: She feels "okay today and better than I have been".   Mental: She does "okay" at thinking, paying attention, remembering, and making decisions. "I'm OK, so far."   PAST MEDICAL, FAMILY, AND SOCIAL HISTORY:  Past Medical History:  Diagnosis Date  . Arthritis   . Combined hyperlipidemia   . COPD (chronic obstructive pulmonary disease) ([place])   . Coronary artery spasm ([place])   . Dupuytren's contracture   . Dyspepsia   . Familial tremor   . Fatigue   . Fibromyalgia syndrome   . GERD (gastroesophageal reflux disease)   . Hypertension   .  Hypothyroidism, acquired, autoimmune   . Multinodular goiter (nontoxic)   . Tachycardia   . Thyroiditis, autoimmune   . Wears glasses     Family History  Problem Relation Age of Onset  . Cancer Mother   . Thyroid disease Neg Hx   . Hypertension Mother   . Hypertension Sister   . Diabetes Sister   . Heart attack Father   . Sudden death Father   . Stroke Mother   . Stroke Sister      Current Outpatient Prescriptions:  .  carbamazepine (TEGRETOL) 100 MG chewable tablet, Chew 1 tablet (100 mg total) by mouth 2 (two) times daily. After 1 week increase to three times daily if tolerated, Disp: 60 tablet, Rfl: 0 .  Cyanocobalamin (VITAMIN B 12 PO), Take by mouth daily., Disp: , Rfl:  .  DULoxetine (CYMBALTA) 60 MG capsule, Take 60 mg by mouth daily., Disp: , Rfl:  .  enalapril (VASOTEC) 2.5 MG tablet, Take 2.5 mg by mouth daily. , Disp: , Rfl:  .  mesalamine (LIALDA) 1.2 G EC tablet, Take by mouth daily with breakfast. Two pills daily., Disp: , Rfl:  .  Multiple Vitamin (MULTIVITAMIN) tablet, Take 1 tablet by mouth daily.  , Disp: , Rfl:  .  omeprazole (PRILOSEC) 20 MG capsule, Take 20 mg by  mouth daily., Disp: , Rfl:  .  predniSONE (DELTASONE) 2.5 MG tablet, Take 2.5 mg by mouth daily with breakfast., Disp: , Rfl:  .  rosuvastatin (CRESTOR) 5 MG tablet, Take 5 mg by mouth daily., Disp: , Rfl:  .  SYNTHROID 112 MCG tablet, TAKE 1 TABLET 5 DAYS A WEEK AND 1 & 1/2 TABLETS 2 DAYS A WEEK, Disp: 35 tablet, Rfl: 1 .  tiotropium (SPIRIVA) 18 MCG inhalation capsule, Place 18 mcg into inhaler and inhale daily., Disp: , Rfl:  .  VITAMIN D, CHOLECALCIFEROL, PO, Take by mouth daily., Disp: , Rfl:   Allergies as of 02/22/2016 - Review Complete 02/22/2016  Allergen Reaction Noted  . Amoxicillin Hives 06/18/2010  . Codeine Nausea And Vomiting 02/21/2013  . Lorabid [loracarbef] Hives 06/18/2010  . Sulfa antibiotics Hives 06/18/2010    1. Work and Family: She retired in June 2013, but later went back to work part-time. She still works on Mondays and Thursdays. She stays fairly busy. Her husband, who is 69, still works 4 days per week.   2. Activities: Physical activities are still somewhat restricted due to foot pain and fatigue. She has not been walking much lately. 3. Smoking, alcohol, or drugs: None 4. Primary Care Provider: Dr. Carol Ada, [facility] [place]: There are no other significant problems involving Shelby Lin's other body systems.   Objective:  Vital Signs:  BP 132/82   Pulse 80   Wt 152 lb (68.9 kg)   BMI 26.50 kg/m     PHYSICAL EXAMINATION  Constitutional: Shelby Lin looks good today. She was very alert, bright, chatty, had a good sense of humor, had a normal affect, and had good insight. Her weight has increased two pounds.  Eyes: There is no obvious arcus or proptosis. Moisture appears normal. Mouth: The oropharynx and tongue appear normal. Oral moisture is normal. There is no oral hyperpigmentation. Neck: The neck appears to be visibly normal. No carotid bruits are noted. The thyroid gland is slightly smaller and still within normal  limits at about 18-20 grams in size. The consistency of the thyroid gland is normal. The thyroid gland is not  tender to palpation. Lungs: The lungs are clear to auscultation. Air movement is good. Heart: Heart rate and rhythm are regular. Heart sounds S1 and S2 are normal. I did not appreciate any pathologic cardiac murmurs. Abdomen: The abdomen is somewhat enlarged. Bowel sounds are normal. There is no obvious hepatomegaly, splenomegaly, or other mass effect. The abdomen is not tender to palpation. Arms: Muscle size and bulk are normal for age.  Hands: There is 1-2+ tremor. Phalangeal and metacarpophalangeal joints are normal. Palmar muscles are low-normal. Palmar moisture is also normal. There is 1+ palmar hyperpigmentation.  Legs: Muscles appear normal for age. No edema is present. She has pain to palpation in the distal shins. Neurologic: Strength is normal for age in both the upper and lower extremities. Muscle tone is normal. Sensation to touch is normal in the legs.   LAB DATA:   Labs 02/22/16: HbA1c 6.4%  Labs 11/11/15: TSH 0.05, free T4 1.5, free T3 3.6  Labs 08/14/15: HbA1c 5.8%  Labs 04/27/15: HbA1c 6.3%; TSH 1.04, free T4 1.3, free T3 2.8; PTH 45, calcium 9.4, 25-OH vitamin D 19  Labs 04/24/15: C-peptide 1.43 (normal 0.80-3.90)  Labs 12/04/14: HbA1c 6.1%  Labs 09/26/14 at 12:01 PM: CMP normal; HbA1c 6.2%;CBC normal, iron 81; ACTH 21, cortisol 12.9; vitamin B12 705 (normal 211-911)  09/19/14: TSH 1.640, free T4 1.03, free T3 2.6; Calcium 9.3, PTH 33 (increased from 26 in March), 25-OH vitamin D 21 (decreased from 23 in march)  04/28/14: TSH 0.081, free T4 1.47, free T3 3.3; calcium 9.4, PTH 26, 25-OH vitamin D 23  10/27/13: TSH 1.087, free T4 1.04, free T3 2.7; Calcium 9.6, PTH 32, 25-hydroxyvitamin D 34  05/18/13: TSH 0.052, free T4 1.24, free T3 3.2  04/28/13: TSH 0.426, free T4 1.24, free T3 3.2; calcium 9.7, 25-hydroxy vitamin D 36  10/08/12: TSH 2.106, free T4 1.05, free T3  2.6; 25-OH vitamin D 37, PTH 39.4, calcium 9.1  04/02/12: TSH 1.073, free T4 1.31, free T3 2.9, calcium 9.5, 25-hydroxy vitamin D 31 - PTH was ordered but not done. I have re-ordered it this morning.  09/30/11: TSH 8.095, free T4 1.16, free T3 2.4. PTH 24, calcium 9.3, 25-hydroxy vitamin D 33, 1,25-dihydroxy vitamin D 95 11/21/10: Calcium was 9.5. Her PTH was 37.6. 25-hydroxy vitamin D was 41. 1, 25-dihydroxy vitamin D was 85, which was slightly elevated. B12 was 352 (211-911)    IMAGING:   12/07/14 US thyroid: Both lobes are smaller, with maximum dimensions <2.5 cm. Both lobes are heterogenous in echotexture. The previously seen exophytic, solid, hypoechoic, ill-defined nodule in the left inferior pole remains at 0.9 x 0.4 x 0.5 cm and is unchanged from 08/23/2009.   10/12/12: US thyroid gland: Small 10 x 4 x 5 mm nodule or adjacent lymph node or parathyroid adenoma. Diffuse echogenicity. As I read the [place], there is definitely a lot of echogenicity c/w Hashimoto's thyroiditis. There may be a small nodule that is actually a bit less than one cm in longest dimension. This is unchanged since June 2011.   Assessment and Plan:   ASSESSMENT:  1. Hypothyroid:   A. She has acquired hypothyroidism due to Hashimoto's thyroiditis. During the past three years her TFTS have been variable, requiring changes in her Synthroid dosage. Part of the variability was due to flare ups of Hashimoto's disease causing Hashitoxicosis.   B. In September 2017 she was hyperthyroid, so I asked her to skip Synthroid on one day each week. We need to recheck her  TFTs now.  2. Thyroiditis: The patient's Hashimoto's disease has usually been clinically quiescent, but was subclinically active in the January-March 2016 time period. Her thyroid US in October 2016 showed the heterogeneity c/w Hashimoto's thyroiditis. We'll see if she loses more thyroid cells in the next 2-3 years. 3. Headaches: Headaches appear to be "tension" headaches.  They have decreased in both frequency and severity. She needs to perform her cervical stretching exercises daily.   4. Multinodular goiter: Thyroid gland is smaller and is again within normal limits for size today. The waxing and waning of thyroid gland size is c/w evolving Hashimoto's disease. Although some [place] studies have shown a nodule, her [place] study in June 2011 showed either a questionable nodule or demonstrated the echotexture heterogeneity that is commonly seen with Hashimoto's disease. Her [place] studies in August 2014 and October 2016 were essentially unchanged. She does not need a FNA at this time.  5. Hot flashes and sweats: These problems are less frequent.  6-7. Leg pains/vitamin D deficiency disease:   A. Her labs in July 2016 showed that her calcium was normal at 9.3, just below the 50%. Her PTH value was mid-range normal. Her vitamin D was lower. She needed more calcium and vitamin D. I had previously asked her to take Citracal-D at doses of 1200-1500 mg of calcium per day and [phone] IU of vitamin D per day.   B. Her labs in March 2017 showed that her PTH value of 45 was higher, but still mid-normal. Calcium was a bit higher at 9.4. Vitamin D was lower at 19. At that point she agreed to purchase and take the Biotech form of vitamin D, one 50,000 IU capsule each week.   C. She stopped taking her MVI daily, but does take calcium with D once daily and Biotech vitamin D, 50,000 units each week.    8. Meniere's disease: Her vertigo has essentially resolved. Her left ear Meniere's disease has not bothered her for months. Her hearing in the left ear varies.  9. Hoarseness: The fact that her hoarseness and post nasal drip come and go essentially in parallel indicate that the hoarseness is likely due to flare ups of her allergies. However, several flare ups of Hashimoto's disease have also caused hoarseness in the past. 10. Dyspepsia: This problem is well controlled with Protonix. 11. Hypertension: Her  BP has increased since reducing her walking.   13. Depression: She looks very good today.   14. Fibromyalgia syndrome: This problem remains a major problem for her. Exercise will help her "re-charge her batteries".  15. Fatigue: I suspect that her fatigue is due to a combination of fibromyalgia, depression, and getting progressively out of shape. She does not have any clinical stigmata of adrenal insufficiency. Fortunately her cortisol, ACTH, CMP, CBC, iron, and B12 were all normal in August 2016.  16. Prediabetes: Her HbA1c values in July and in October 2016 were elevated into the "pre-diabetes" range. Her HbA1c in March 2017 was higher. Her HbA1c today was higher, just barely within the prediabetes range. It appears that she may be following her sister's pattern of developing T2DM at about age 50. The sister controlled her DM through metformin and diet. I again offered her metformin and she again refused, politely. Since Shelby Lin's foot pain is severe enough to limit her walking, she needs to try other exercise such as glide machine, bike, or water aerobics.  She also needs to control her carb intake more tightly. 17. Arthritis: I still  think that Shelby Lin should be referred to the rheumatologist of Dr. Thompson Caul choosing in order to determine if any physical therapies or adjunctive therapies will help her.   PLAN:  1. Diagnostic: Reviewed HbA1c result. Obtain TFTs, CMP, calcium, PTH, 25-OH vitamin D, and C-peptide now. 2. Therapeutic: Do her best to continue daily physical activity. Watch what she eats and drinks. Continue Synthroid at current doses. Take MVI daily and Biotech, 50,000 IU per week.  3. Patient education: We discussed the interrelated pathophysiologies of headaches, Hashimoto's disease, hypothyroidism, fibromyalgia, and fatigue. We discussed what she needs to do to control her BGs, to include getting more exercise and following our Eat Right Diet.  4. Follow-up: 3 months  Level of  Service: This visit lasted in excess of 60 minutes. More than 50% of the visit was devoted to counseling.    Tillman Sers, MD, CDE Adult and Pediatric Endocrinology

## 2016-02-23 LAB — COMPREHENSIVE METABOLIC PANEL
ALK PHOS: 101 U/L (ref 33–130)
ALT: 9 U/L (ref 6–29)
AST: 16 U/L (ref 10–35)
Albumin: 4.2 g/dL (ref 3.6–5.1)
BILIRUBIN TOTAL: 0.5 mg/dL (ref 0.2–1.2)
BUN: 21 mg/dL (ref 7–25)
CALCIUM: 10 mg/dL (ref 8.6–10.4)
CO2: 25 mmol/L (ref 20–31)
CREATININE: 0.59 mg/dL — AB (ref 0.60–0.93)
Chloride: 100 mmol/L (ref 98–110)
GLUCOSE: 108 mg/dL — AB (ref 70–99)
Potassium: 4.5 mmol/L (ref 3.5–5.3)
SODIUM: 140 mmol/L (ref 135–146)
Total Protein: 6.6 g/dL (ref 6.1–8.1)

## 2016-02-23 LAB — C-PEPTIDE: C-Peptide: 1.57 ng/mL (ref 0.80–3.85)

## 2016-02-23 LAB — TSH: TSH: 0.16 m[IU]/L — AB

## 2016-02-23 LAB — T3, FREE: T3 FREE: 2.8 pg/mL (ref 2.3–4.2)

## 2016-02-23 LAB — T4, FREE: FREE T4: 1.3 ng/dL (ref 0.8–1.8)

## 2016-02-23 LAB — VITAMIN D 25 HYDROXY (VIT D DEFICIENCY, FRACTURES): Vit D, 25-Hydroxy: 45 ng/mL (ref 30–100)

## 2016-02-26 LAB — PTH, INTACT AND CALCIUM
Calcium: 10 mg/dL (ref 8.6–10.4)
PTH: 28 pg/mL (ref 14–64)

## 2016-02-29 ENCOUNTER — Telehealth (INDEPENDENT_AMBULATORY_CARE_PROVIDER_SITE_OTHER): Payer: Self-pay | Admitting: *Deleted

## 2016-02-29 ENCOUNTER — Other Ambulatory Visit (INDEPENDENT_AMBULATORY_CARE_PROVIDER_SITE_OTHER): Payer: Self-pay | Admitting: *Deleted

## 2016-02-29 ENCOUNTER — Encounter (INDEPENDENT_AMBULATORY_CARE_PROVIDER_SITE_OTHER): Payer: Self-pay | Admitting: *Deleted

## 2016-02-29 DIAGNOSIS — E034 Atrophy of thyroid (acquired): Secondary | ICD-10-CM

## 2016-02-29 MED ORDER — LEVOTHYROXINE SODIUM 88 MCG PO TABS: 88.0000 ug | ORAL_TABLET | Freq: Every day | ORAL | 5 refills | Status: DC

## 2016-02-29 NOTE — Telephone Encounter (Signed)
-----   Message from Sherrlyn Hock, MD sent at 02/28/2016 10:19 PM EST ----- PTH and calcium are normal. Vitamin D is normal, but lower than 6 months ago. She needs to continue to take calcium and vitamin D. C-peptide is still normal, but in the lower half of the normal range. Metabolic panel was normal. Thyroid tests are still high. Please change her Synthroid to 88 mcg per day and repeat TFTs in 2 months.

## 2016-02-29 NOTE — Telephone Encounter (Signed)
Spoke to patient, advised that per [name] PTH and calcium are normal. Vitamin D is normal, but lower than 6 months ago. She needs to continue to take calcium and vitamin D. C-peptide is still normal, but in the lower half of the normal range. Metabolic panel was normal. Thyroid tests are still high. Please change her Synthroid to 88 mcg per day and repeat TFTs in 2 months, Script sent to pharmacy and labs placed in portal.

## 2016-05-26 ENCOUNTER — Ambulatory Visit (INDEPENDENT_AMBULATORY_CARE_PROVIDER_SITE_OTHER): Payer: Medicare Other | Admitting: "Endocrinology

## 2016-06-06 LAB — HEMOGLOBIN A1C
Hgb A1c MFr Bld: 5.8 % — ABNORMAL HIGH (ref <5.7)
Mean Plasma Glucose: 120 mg/dL

## 2016-06-06 LAB — T4, FREE: FREE T4: 1.1 ng/dL (ref 0.8–1.8)

## 2016-06-06 LAB — TSH: TSH: 1.4 m[IU]/L

## 2016-06-06 LAB — T3, FREE: T3 FREE: 2.7 pg/mL (ref 2.3–4.2)

## 2016-06-11 ENCOUNTER — Ambulatory Visit (INDEPENDENT_AMBULATORY_CARE_PROVIDER_SITE_OTHER): Payer: Medicare Other | Admitting: "Endocrinology

## 2016-06-11 ENCOUNTER — Encounter (INDEPENDENT_AMBULATORY_CARE_PROVIDER_SITE_OTHER): Payer: Self-pay | Admitting: "Endocrinology

## 2016-06-11 VITALS — BP 120/68 | HR 90 | Wt 146.8 lb

## 2016-06-11 DIAGNOSIS — E063 Autoimmune thyroiditis: Secondary | ICD-10-CM | POA: Diagnosis not present

## 2016-06-11 DIAGNOSIS — I10 Essential (primary) hypertension: Secondary | ICD-10-CM | POA: Diagnosis not present

## 2016-06-11 DIAGNOSIS — E049 Nontoxic goiter, unspecified: Secondary | ICD-10-CM | POA: Diagnosis not present

## 2016-06-11 DIAGNOSIS — R7303 Prediabetes: Secondary | ICD-10-CM | POA: Diagnosis not present

## 2016-06-11 DIAGNOSIS — M25561 Pain in right knee: Secondary | ICD-10-CM | POA: Diagnosis not present

## 2016-06-11 DIAGNOSIS — M25562 Pain in left knee: Secondary | ICD-10-CM | POA: Diagnosis not present

## 2016-06-11 DIAGNOSIS — R1013 Epigastric pain: Secondary | ICD-10-CM

## 2016-06-11 NOTE — Patient Instructions (Signed)
Follow up visit in 4 months. Please repeat lab tests one week prior.  

## 2016-06-11 NOTE — Progress Notes (Signed)
Subjective:  Patient Name: Shelby Lin Date of Birth: 04-15-1940  MRN: [id number]  Shelby Lin  presents to the office today for follow-up of her hypothyroidism secondary to Hashimoto's disease, questionable nodular goiter, tachycardia, tremor, dyspepsia, fatigue, fibromyalgia, hyperlipidemia, night sweats, tremor, and pre-diabetes.  HISTORY OF PRESENT ILLNESS:   Shelby Lin is a 76 y.o. Caucasian woman.  Shelby Lin was unaccompanied.  1. Shelby Lin was referred to me on 03/25/2005 by her primary care provider, Dr. Carol Ada, for evaluation of her thyroid problems. Shelby Lin was 50 at that time.   A. At about age 57 the patient developed thyroid problems and was put on a thyroid medicine. Later when she became pregnant her obstetrician took her off that medicine. At about age 59, she developed severe fatigue and weight gain. She was put back on Synthroid. Her dose of Synthroid was 88 mcg per day for many years. In January 2006, she had fluctuations in her thyroid function tests. Synthroid was increased to 100 mcg and later to 125 mcg per day. The Synthroid dose was then reduced to 75 mcg. After blood tests about 9 months prior to her first appointment with me, the patient was put back on a dose of 88 mcg per day. When the patient was having fluctuations in her thyroid tests, her thyroid gland became visibly enlarged and felt full and tender to palpation. She was also more hoarse during those periods. Hoarseness came and went in parallel with the thyroid gland swelling. Ultrasound studies of the thyroid in January 2006 and January 2007 showed multinodular goiter.   B. Thyroid function tests performed at that first visit showed a TSH of 4.139, free T4 1.22, and free T3 of 2.7. Because any TSH at that time greater than 3.0 was actually high, I increased her Synthroid from 88 to 100 mcg per day. TPO antibody was markedly positive at 954.0, c/w the diagnosis of Hashimoto's thyroiditis.Marland Kitchen TSI was quite normal at  0.9.   2. During the last 11 years the patient has had several medical issues that we have followed:  A. She has had several flare-ups of Hashimoto's disease, resulting in several changes in her doses of Synthroid. Her Synthroid dose was subsequently decreased to 88 mcg/day in January 2018.   B. Although she was initially thought to have a multinodular goiter, over time it became clear that all but one of the nodules were actually areas of echotexture heterogeneity. She has had one "nodularish" area of her left inferior pole that has remained unchanged since 2011. This area is ill-defined and may also just represent several overlapping areas of heterogeneity.  C. She has had problems with fibromyalgia, arthritis, depression, dyspepsia, fatigue, tremor, hyperlipidemia, and dyspepsia.   D. In August 2016 her HbA1c was 6.2%, c/w prediabetes. Follow up HbA1c in October 2016 was 6.1%. The patient refused to take metformin because three of her relatives lost their scalp hair when they took metformin.    3. The patient's last [facility] visit was on 02/12/16. In the interim she has been healthy, except for recurrent allergies/URIs. Her PCP put her on 20 mg of prednisone/day for 5 days beginning yesterday.   A. Her tension headaches are probably less frequent. She does better when she does her cervical stretching exercises regularly.   B. She still has episodic fatigue, but probably less. She has not been bothered by dizziness recently. She still has hot spells or sweats occasionally at night.   C. Her energy is "nothing extra". Her  depression is "okay".  She still has some family problems that she is dealing with that are not better or worse.    D. She continues to have daily problems with fibromyalgia and arthritis in her wrists, hands, knees, and feet.   E.  She has not had any more bloody stools or alternating constipation and diarrhea. She has not been having any abdominal pains.   F. Her feet still bother her  a lot. Her feet get red and feel hot in the forefoot area a lot, especially when the weather is warm. Shelby Lin. Her Meniere's disease is about the same.  She still has "some ringing and roaring once in a while".  H. Her Spiriva was changed to Incruse.     I. She remains on her Protonix daily. She still takes Synthroid, now 88 mcg/day. She also takes enalapril, Crestor 5 mg/day, and Biotech vitamin D, 50,000 IU/day.   J. The wens on her wrists have resolved.      K. She is intermittently hoarse. The hoarseness still comes and goes. The hoarseness may last 12-18 hours. These episodes are usually related to URIs and allergies.    L. " I haven't had any sweet tea since I was here last time. I could drink that sink full of it."   4. Pertinent Review of Systems:  Constitutional: "I feel alright."  Her stamina and energy are better overall, but some days are better than others.  Eyes: Vision is good as long as she wears her glasses. She had an eye exam in March 2018. There were no significant eye problems noted.   Neck: The thyroid gland has not seemed to swell lately. The patient has no complaints of anterior neck soreness, tenderness,  pressure, discomfort, or difficulty swallowing.  Heart: She has not had any other chest symptoms. Heart rate increases with exercise or other physical activity. The patient has no other complaints of palpitations, irregular heat beats, chest pain, or chest pressure. Gastrointestinal: Protonix is helping her. Her acid reflux and dyspepsia have essentially resolved. Bowel movements are normal.  Legs: Muscle mass and strength seem normal. Legs are stronger when she walks more. She no longer has episodic leg numbness and burning. She has not had any edema recently.   Feet: Her feet still burn in the forefeet at times.    GYN: She still gets hot flashes, but much milder.  Emotional/psychiatric: She feels "okay, about the same as at the last visit".    Mental: She does "okay" at  thinking, paying attention, remembering, and making decisions. "I'm OK."   PAST MEDICAL, FAMILY, AND SOCIAL HISTORY:  Past Medical History:  Diagnosis Date  . Arthritis   . Combined hyperlipidemia   . COPD (chronic obstructive pulmonary disease) ([place])   . Coronary artery spasm ([place])   . Dupuytren's contracture   . Dyspepsia   . Familial tremor   . Fatigue   . Fibromyalgia syndrome   . GERD (gastroesophageal reflux disease)   . Hypertension   . Hypothyroidism, acquired, autoimmune   . Multinodular goiter (nontoxic)   . Tachycardia   . Thyroiditis, autoimmune   . Wears glasses     Family History  Problem Relation Age of Onset  . Cancer Mother   . Thyroid disease Neg Hx   . Hypertension Mother   . Hypertension Sister   . Diabetes Sister   . Heart attack Father   . Sudden death Father   . Stroke Mother   .  Stroke Sister      Current Outpatient Prescriptions:  .  DULoxetine (CYMBALTA) 60 MG capsule, Take 60 mg by mouth daily., Disp: , Rfl:  .  enalapril (VASOTEC) 2.5 MG tablet, Take 2.5 mg by mouth daily. , Disp: , Rfl:  .  levothyroxine (SYNTHROID) 88 MCG tablet, Take 1 tablet (88 mcg total) by mouth daily before breakfast., Disp: 30 tablet, Rfl: 5 .  Multiple Vitamin (MULTIVITAMIN) tablet, Take 1 tablet by mouth daily.  , Disp: , Rfl:  .  predniSONE (DELTASONE) 2.5 MG tablet, Take 2.5 mg by mouth daily with breakfast., Disp: , Rfl:  .  rosuvastatin (CRESTOR) 5 MG tablet, Take 5 mg by mouth daily., Disp: , Rfl:  .  umeclidinium bromide (INCRUSE ELLIPTA) 62.5 MCG/INH AEPB, Inhale 1 puff into the lungs daily., Disp: , Rfl:  .  VITAMIN D, CHOLECALCIFEROL, PO, Take by mouth daily., Disp: , Rfl:  .  carbamazepine (TEGRETOL) 100 MG chewable tablet, Chew 1 tablet (100 mg total) by mouth 2 (two) times daily. After 1 week increase to three times daily if tolerated, Disp: 60 tablet, Rfl: 0 .  Cyanocobalamin (VITAMIN B 12 PO), Take by mouth daily., Disp: , Rfl:  .  mesalamine  (LIALDA) 1.2 G EC tablet, Take by mouth daily with breakfast. Two pills daily., Disp: , Rfl:  .  omeprazole (PRILOSEC) 20 MG capsule, Take 20 mg by mouth daily., Disp: , Rfl:  .  tiotropium (SPIRIVA) 18 MCG inhalation capsule, Place 18 mcg into inhaler and inhale daily., Disp: , Rfl:   Allergies as of 06/11/2016 - Review Complete 06/11/2016  Allergen Reaction Noted  . Amoxicillin Hives 06/18/2010  . Codeine Nausea And Vomiting 02/21/2013  . Lorabid [loracarbef] Hives 06/18/2010  . Sulfa antibiotics Hives 06/18/2010    1. Work and Family: She retired in June 2013, but later went back to work part-time. She still works on Mondays and Thursdays. She still stays busy. Her husband, who is 74, still works 4 days per week.   2. Activities: She is walking for up to 20 minutes per day.  3. Smoking, alcohol, or drugs: None 4. Primary Care Provider: Dr. Carol Ada, [facility] [place]: There are no other significant problems involving Shelby Lin's other body systems.   Objective:  Vital Signs:  BP 120/68   Pulse 90   Wt 146 lb 12.8 oz (66.6 kg)   BMI 25.60 kg/m     PHYSICAL EXAMINATION  Constitutional: Shelby Lin looks good today. She is very alert, bright, chatty, has a good sense of humor, has a normal affect, and has good insight. Her weight has decreased six pounds.  Eyes: There is no obvious arcus or proptosis. Moisture appears normal. Mouth: The oropharynx and tongue appear normal. Oral moisture is normal. There is no oral hyperpigmentation. Neck: The neck appears to be visibly normal. No carotid bruits are noted. The thyroid gland is again within normal limits at about 18-20 grams in size. The consistency of the thyroid gland is normal. The thyroid gland is not tender to palpation. Lungs: The lungs are clear to auscultation. Air movement is good. Heart: Heart rate and rhythm are regular. Heart sounds S1 and S2 are normal. I did not appreciate any pathologic  cardiac murmurs. Abdomen: The abdomen is somewhat enlarged. Bowel sounds are normal. There is no obvious hepatomegaly, splenomegaly, or other mass effect. The abdomen is not tender to palpation. Arms: Muscle size and bulk are normal for age.  Hands:  There is 1-2+ tremor. Phalangeal and metacarpophalangeal joints are normal. Palmar muscles are low-normal. Palmar moisture is also normal. There is 1+ palmar erythema. Palms are warm. Legs: Muscles appear normal for age. No edema is present. She has pain to palpation in the distal shins. Neurologic: Strength is normal for age in both the upper and lower extremities. Muscle tone is normal. Sensation to touch is normal in the legs.   LAB DATA:   Labs 06/05/16: HbA1c 5.8%, fasting glucose 120; TSH  1.40, free T4 1.1, free T3 2.7  Labs 02/22/16: HbA1c 6.4%, CBG 104, C-peptide 1.57 (ref 080-3.85); PTH 28, calcium 10, 25-OH vitamin D 45; TSH 0.16, free T4 1.3, free T3 2.8; CMP normal  Labs 11/11/15: TSH 0.05, free T4 1.5, free T3 3.6  Labs 08/14/15: HbA1c 5.8%  Labs 04/27/15: HbA1c 6.3%; TSH 1.04, free T4 1.3, free T3 2.8; PTH 45, calcium 9.4, 25-OH vitamin D 19  Labs 04/24/15: C-peptide 1.43 (normal 0.80-3.90)  Labs 12/04/14: HbA1c 6.1%  Labs 09/26/14 at 12:01 PM: CMP normal; HbA1c 6.2%;CBC normal, iron 81; ACTH 21, cortisol 12.9; vitamin B12 705 (normal 211-911)  09/19/14: TSH 1.640, free T4 1.03, free T3 2.6; Calcium 9.3, PTH 33 (increased from 26 in March), 25-OH vitamin D 21 (decreased from 23 in march)  04/28/14: TSH 0.081, free T4 1.47, free T3 3.3; calcium 9.4, PTH 26, 25-OH vitamin D 23  10/27/13: TSH 1.087, free T4 1.04, free T3 2.7; Calcium 9.6, PTH 32, 25-hydroxyvitamin D 34  05/18/13: TSH 0.052, free T4 1.24, free T3 3.2  04/28/13: TSH 0.426, free T4 1.24, free T3 3.2; calcium 9.7, 25-hydroxy vitamin D 36  10/08/12: TSH 2.106, free T4 1.05, free T3 2.6; 25-OH vitamin D 37, PTH 39.4, calcium 9.1  04/02/12: TSH 1.073, free T4 1.31, free T3  2.9, calcium 9.5, 25-hydroxy vitamin D 31 - PTH was ordered but not done. I have re-ordered it this morning.  09/30/11: TSH 8.095, free T4 1.16, free T3 2.4. PTH 24, calcium 9.3, 25-hydroxy vitamin D 33, 1,25-dihydroxy vitamin D 95 11/21/10: Calcium was 9.5. Her PTH was 37.6. 25-hydroxy vitamin D was 41. 1, 25-dihydroxy vitamin D was 85, which was slightly elevated. B12 was 352 (211-911)    IMAGING:   12/07/14 US thyroid: Both lobes are smaller, with maximum dimensions <2.5 cm. Both lobes are heterogenous in echotexture. The previously seen exophytic, solid, hypoechoic, ill-defined nodule in the left inferior pole remains at 0.9 x 0.4 x 0.5 cm and is unchanged from 08/23/2009.   10/12/12: US thyroid gland: Small 10 x 4 x 5 mm nodule or adjacent lymph node or parathyroid adenoma. Diffuse echogenicity. As I read the [place], there is definitely a lot of echogenicity c/w Hashimoto's thyroiditis. There may be a small nodule that is actually a bit less than one cm in longest dimension. This is unchanged since June 2011.   Assessment and Plan:   ASSESSMENT:  1. Hypothyroid:   A. She has acquired hypothyroidism due to Hashimoto's thyroiditis. During the past 11 years her TFTs have been variable, requiring changes in her Synthroid dosage. Part of the variability was due to flare ups of Hashimoto's disease causing Hashitoxicosis.   B. In September 2017 she was hyperthyroid, so I asked her to skip Synthroid on one day each week. In December her TFTs were elevated, so I changed her Synthroid dose accordingly. Her TFTs in April 2018 were mid-normal.  2. Thyroiditis: The patient's Hashimoto's disease has usually been clinically quiescent, but was subclinically active  in the January-March 2016 time period. Her thyroid US in October 2016 showed the heterogeneity c/w Hashimoto's thyroiditis. We'll see if she loses more thyroid cells over time. 3. Headaches: Headaches appear to be "tension" headaches. They have decreased  in both frequency and severity. She needs to perform her cervical stretching exercises daily.   4. Multinodular goiter: Thyroid gland is smaller and is again within normal limits for size today. The waxing and waning of thyroid gland size is c/w evolving Hashimoto's disease. Although some [place] studies have shown a nodule, her [place] study in June 2011 showed either a questionable nodule or demonstrated the echotexture heterogeneity that is commonly seen with Hashimoto's disease. Her [place] studies in August 2014 and October 2016 were essentially unchanged. She does not need a FNA at this time.  5. Hot flashes and sweats: These problems are less frequent.  6-7. Leg pains/vitamin D deficiency disease:   A. Her labs in July 2016 showed that her calcium was normal at 9.3, just below the 50%. Her PTH value was mid-range normal. Her vitamin D was lower. She needed more calcium and vitamin D. I had previously asked her to take Citracal-D at doses of 1200-1500 mg of calcium per day and [id number] IU of vitamin D per day.   B. Her labs in March 2017 showed that her PTH value of 45 was higher, but still mid-normal. Calcium was a bit higher at 9.4. Vitamin D was lower at 19. At that point she agreed to purchase and take the Biotech form of vitamin D, one 50,000 IU capsule each week.   C. Her PTH, calcium and vitamin D were normal in December 2017, although the vitamin D had decreased. She is now taking Biotech vitamin D, 50,000 units each week.   8. Meniere's disease: Her vertigo has essentially resolved. Her left ear Meniere's disease has not bothered her for months. Her hearing in the left ear varies.  9. Hoarseness: The fact that her hoarseness and post nasal drip come and go essentially in parallel indicate that the hoarseness is likely due to flare ups of her allergies. However, several flare ups of Hashimoto's disease have also caused hoarseness in the past. 10. Dyspepsia: This problem is well controlled with Protonix. 11.  Hypertension: Her BP has decreased since increasing her walking.   13. Depression: She looks very good today.   14. Fibromyalgia syndrome: This problem remains a major problem for her. Exercise will help her "re-charge her batteries".  15. Fatigue:  This problem has also improved. 16. Prediabetes: Her HbA1c values in July and in October 2016 were elevated into the "pre-diabetes" range. Her HbA1c in March 2017 was higher. Her HbA1c today was lower, c/w her weight loss and increase in exercise.  17. Arthritis: I still think that Shelby Lin might benefit from a referral to the rheumatologist of Dr. Thompson Caul choosing in order to determine if any physical therapies or adjunctive therapies will help her. Shelby Lin, however, does not feel that a referral is needed.   PLAN:  1. Diagnostic: Reviewed HbA1c result and lab results from December. Vitamin D, and TFTs prior to next visit. HbA1c at next visit. 2. Therapeutic: Do her best to continue daily physical activity. Watch what she eats and drinks. Continue Synthroid at current doses. Take MVI daily and Biotech, 50,000 IU per week.  3. Patient education: We again discussed the issues of prediabetes, headaches, Hashimoto's disease, hypothyroidism, fibromyalgia, and fatigue. We discussed what she needs to do to control her BGs,  to include getting more exercise and following our Eat Right Diet. She is pleased with the improvements in her HbA1c and weight.  4. Follow-up: 4 months  Lin of Service: This visit lasted in excess of 60 minutes. More than 50% of the visit was devoted to counseling.   Tillman Sers, MD, CDE Adult and Pediatric Endocrinology

## 2016-09-08 DIAGNOSIS — H938X2 Other specified disorders of left ear: Secondary | ICD-10-CM | POA: Insufficient documentation

## 2016-10-08 LAB — TSH: TSH: 2.63 mIU/L

## 2016-10-08 LAB — VITAMIN D 25 HYDROXY (VIT D DEFICIENCY, FRACTURES): Vit D, 25-Hydroxy: 47 ng/mL (ref 30–100)

## 2016-10-08 LAB — T3, FREE: T3, Free: 2.9 pg/mL (ref 2.3–4.2)

## 2016-10-08 LAB — T4, FREE: FREE T4: 1.3 ng/dL (ref 0.8–1.8)

## 2016-10-09 ENCOUNTER — Encounter (INDEPENDENT_AMBULATORY_CARE_PROVIDER_SITE_OTHER): Payer: Self-pay

## 2016-10-09 ENCOUNTER — Telehealth (INDEPENDENT_AMBULATORY_CARE_PROVIDER_SITE_OTHER): Payer: Self-pay | Admitting: "Endocrinology

## 2016-10-09 NOTE — Telephone Encounter (Signed)
°  Who's calling (name and relationship to patient) : Shelby Lin (patient) Best contact number: [phone] Provider they see: Tobe Sos Reason for call: Patient stated that Dr Tobe Sos said her blood work was okay and did not need to change her meds.  She wants to know if she still need to keep her appt on Monday, Aug 20th??  Please call.     PRESCRIPTION REFILL ONLY  Name of prescription:  Pharmacy:

## 2016-10-10 ENCOUNTER — Telehealth (INDEPENDENT_AMBULATORY_CARE_PROVIDER_SITE_OTHER): Payer: Self-pay | Admitting: "Endocrinology

## 2016-10-10 NOTE — Telephone Encounter (Signed)
LVM to advise yes, needs to keep appointment for Monday with [name].

## 2016-10-10 NOTE — Telephone Encounter (Signed)
1. Shelby Lin called the office earlier to ask if she still needed to come in for her appointment next Monday because her recent TFTs and vitamin D results were good. 2. I called her on her cell phone, but she was not available. I also called her at home, but she was not available. I left voicemail messages for her stating that I do need to see her to assess her other problems. Tillman Sers, MD, CDE

## 2016-10-10 NOTE — Telephone Encounter (Signed)
°  Who's calling (name and relationship to patient) : Shelby Lin (patient) Best contact number: [phone] Provider they see: Tobe Sos Reason for call: Patient left voice message today at 10:45am asking whether or not she need to keep appt on Monday.  She left message on yesterday.      PRESCRIPTION REFILL ONLY  Name of prescription:  Pharmacy:

## 2016-10-13 ENCOUNTER — Ambulatory Visit (INDEPENDENT_AMBULATORY_CARE_PROVIDER_SITE_OTHER): Payer: Medicare Other | Admitting: "Endocrinology

## 2016-10-13 ENCOUNTER — Encounter (INDEPENDENT_AMBULATORY_CARE_PROVIDER_SITE_OTHER): Payer: Self-pay | Admitting: "Endocrinology

## 2016-10-13 VITALS — BP 132/80 | HR 86 | Wt 150.4 lb

## 2016-10-13 DIAGNOSIS — R5383 Other fatigue: Secondary | ICD-10-CM | POA: Diagnosis not present

## 2016-10-13 DIAGNOSIS — I10 Essential (primary) hypertension: Secondary | ICD-10-CM | POA: Diagnosis not present

## 2016-10-13 DIAGNOSIS — R7303 Prediabetes: Secondary | ICD-10-CM | POA: Diagnosis not present

## 2016-10-13 DIAGNOSIS — E049 Nontoxic goiter, unspecified: Secondary | ICD-10-CM

## 2016-10-13 DIAGNOSIS — E663 Overweight: Secondary | ICD-10-CM | POA: Diagnosis not present

## 2016-10-13 DIAGNOSIS — E559 Vitamin D deficiency, unspecified: Secondary | ICD-10-CM

## 2016-10-13 DIAGNOSIS — E063 Autoimmune thyroiditis: Secondary | ICD-10-CM | POA: Diagnosis not present

## 2016-10-13 LAB — POCT GLUCOSE (DEVICE FOR HOME USE): POC GLUCOSE: 122 mg/dL — AB (ref 70–99)

## 2016-10-13 LAB — POCT GLYCOSYLATED HEMOGLOBIN (HGB A1C): Hemoglobin A1C: 6.1

## 2016-10-13 NOTE — Progress Notes (Signed)
Subjective:  Patient Name: Shelby Lin Date of Birth: 04/06/1940  MRN: [id number]  Shelby Lin  presents to the office today for follow-up of her hypothyroidism secondary to Hashimoto's disease, questionable nodular goiter, tachycardia, tremor, dyspepsia, fatigue, fibromyalgia, hyperlipidemia, night sweats, tremor, and pre-diabetes.  HISTORY OF PRESENT ILLNESS:   Shelby Lin is a 76 y.o. Caucasian woman.  Shelby Lin was unaccompanied.  1. Shelby Lin was referred to me on 03/25/2005 by her primary care provider, Dr. Carol Ada, for evaluation of her thyroid problems. Shelby Lin was 89 at that time.   A. At about age 68 the patient developed thyroid problems and was put on a thyroid medicine. Later when she became pregnant her obstetrician took her off that medicine. At about age 45, she developed severe fatigue and weight gain. She was put back on Synthroid. Her dose of Synthroid was 88 mcg per day for many years. In January 2006, she had fluctuations in her thyroid function tests. Synthroid was increased to 100 mcg and later to 125 mcg per day. The Synthroid dose was then reduced to 75 mcg. After blood tests about 9 months prior to her first appointment with me, the patient was put back on a dose of 88 mcg per day. When the patient was having fluctuations in her thyroid tests, her thyroid gland became visibly enlarged and felt full and tender to palpation. She was also more hoarse during those periods. Hoarseness came and went in parallel with the thyroid gland swelling. Ultrasound studies of the thyroid in January 2006 and January 2007 showed multinodular goiter.   B. Thyroid function tests performed at that first visit showed a TSH of 4.139, free T4 1.22, and free T3 of 2.7. Because any TSH at that time greater than 3.0 was actually high, I increased her Synthroid from 88 to 100 mcg per day. TPO antibody was markedly positive at 954.0, c/w the diagnosis of Hashimoto's thyroiditis.Marland Kitchen TSI was quite normal at  0.9.   2. During the last 11 years the patient has had several medical issues that we have followed:  A. She has had several flare-ups of Hashimoto's disease, resulting in several changes in her doses of Synthroid. Her Synthroid dose was subsequently decreased to 88 mcg/day in January 2018.   B. Although she was initially thought to have a multinodular goiter, over time it became clear that all but one of the nodules were actually areas of echotexture heterogeneity. She has had one "nodularish" area of her left inferior pole that has remained unchanged since 2011. This area is ill-defined and may also just represent several overlapping areas of heterogeneity.  C. She has had problems with fibromyalgia, arthritis, hypertension, depression, fatigue, tremor, hyperlipidemia, reflux, and dyspepsia.   D. In August 2016 her HbA1c was 6.2%, c/w prediabetes. Follow up HbA1c in October 2016 was 6.1%. The patient refused to take metformin because three of her relatives lost their scalp hair when they took metformin.    3. The patient's last [facility] visit was on 06/11/16. In the interim she has been healthy. Her allergies were acting up more in July, but not since then.    A. Her tension headaches are somewhat better and occur somewhat less frequently. She does better when she does her cervical stretching exercises regularly.   B. She still has episodic fatigue, but probably less. Her energy varies. Some days when she feels really good she may be much more active than usual, and so may tire herself out.  C. She still has hot spells or sweats occasionally. Her COPD symptoms are often worse at those times.    D. Her COPD is better on Spiriva.   E. Her depression is "pretty good".  She still has some family problems that she is dealing with.    F. She continues to have problems with fibromyalgia and arthritis in her wrists, hands, knees, and feet, worse when it rains.   G. Her feet still bother her a lot. Her feet get  red and feel hot in the forefoot area a lot, especially when the weather is warm. .    H. Her Meniere's disease is "fine'. She no longer has any ringing and roaring.   I. She remains on her Protonix daily. She still takes Synthroid, 88 mcg/day. She also takes enalapril 2.5 mg/day, Crestor 5 mg/day, an MVI, and Biotech vitamin D, 50,000 IU/day.   J. She is intermittently hoarse. The hoarseness still comes and goes in association with her allergies.   K. She only has sweet tea at times if she eat out.    4. Pertinent Review of Systems:  Constitutional: "I feel alright."  Her stamina and energy are better overall, but some days are better than others.  Eyes: Vision is good as long as she wears her glasses. She had an eye exam in March 2018. There were no significant eye problems noted.   Neck: The thyroid gland has not seemed to swell lately. The patient has no complaints of anterior neck soreness, tenderness,  pressure, discomfort, or difficulty swallowing.  Heart: She has not had any other chest symptoms. Heart rate increases with exercise or other physical activity. The patient has no other complaints of palpitations, irregular heat beats, chest pain, or chest pressure. Gastrointestinal: Protonix is helping her. Her acid reflux and dyspepsia have essentially resolved. Bowel movements are normal.  Legs: Muscle mass and strength seem normal. Legs are stronger when she walks more. She no longer has episodic leg numbness and burning. She has had edema occasionally.   Feet: Her feet still burn in the forefeet at times.    GYN: She still gets hot flashes, but the intensity of the hot flashes varies over time.   Emotional/psychiatric: She feels "good".    Mental: She does "fine" at thinking, paying attention, remembering, and making decisions.   PAST MEDICAL, FAMILY, AND SOCIAL HISTORY:  Past Medical History:  Diagnosis Date  . Arthritis   . Combined hyperlipidemia   . COPD (chronic obstructive  pulmonary disease) ([place])   . Coronary artery spasm ([place])   . Dupuytren's contracture   . Dyspepsia   . Familial tremor   . Fatigue   . Fibromyalgia syndrome   . GERD (gastroesophageal reflux disease)   . Hypertension   . Hypothyroidism, acquired, autoimmune   . Multinodular goiter (nontoxic)   . Tachycardia   . Thyroiditis, autoimmune   . Wears glasses     Family History  Problem Relation Age of Onset  . Cancer Mother   . Thyroid disease Neg Hx   . Hypertension Mother   . Hypertension Sister   . Diabetes Sister   . Heart attack Father   . Sudden death Father   . Stroke Mother   . Stroke Sister      Current Outpatient Prescriptions:  .  Cyanocobalamin (VITAMIN B 12 PO), Take by mouth daily., Disp: , Rfl:  .  DULoxetine (CYMBALTA) 60 MG capsule, Take 60 mg by mouth daily., Disp: , Rfl:  .  enalapril (VASOTEC) 2.5 MG tablet, Take 2.5 mg by mouth daily. , Disp: , Rfl:  .  levothyroxine (SYNTHROID) 88 MCG tablet, Take 1 tablet (88 mcg total) by mouth daily before breakfast., Disp: 30 tablet, Rfl: 5 .  Multiple Vitamin (MULTIVITAMIN) tablet, Take 1 tablet by mouth daily.  , Disp: , Rfl:  .  omeprazole (PRILOSEC) 20 MG capsule, Take 20 mg by mouth daily., Disp: , Rfl:  .  rosuvastatin (CRESTOR) 5 MG tablet, Take 5 mg by mouth daily., Disp: , Rfl:  .  tiotropium (SPIRIVA) 18 MCG inhalation capsule, Place 18 mcg into inhaler and inhale daily., Disp: , Rfl:  .  umeclidinium bromide (INCRUSE ELLIPTA) 62.5 MCG/INH AEPB, Inhale 1 puff into the lungs daily., Disp: , Rfl:  .  VITAMIN D, CHOLECALCIFEROL, PO, Take by mouth daily., Disp: , Rfl:  .  carbamazepine (TEGRETOL) 100 MG chewable tablet, Chew 1 tablet (100 mg total) by mouth 2 (two) times daily. After 1 week increase to three times daily if tolerated (Patient not taking: Reported on 10/13/2016), Disp: 60 tablet, Rfl: 0 .  mesalamine (LIALDA) 1.2 G EC tablet, Take by mouth daily with breakfast. Two pills daily., Disp: , Rfl:  .   predniSONE (DELTASONE) 2.5 MG tablet, Take 2.5 mg by mouth daily with breakfast., Disp: , Rfl:   Allergies as of 10/13/2016 - Review Complete 10/13/2016  Allergen Reaction Noted  . Amoxicillin Hives 06/18/2010  . Codeine Nausea And Vomiting 02/21/2013  . Lorabid [loracarbef] Hives 06/18/2010  . Sulfa antibiotics Hives 06/18/2010    1. Work and Family: She retired in June 2013, but later went back to work part-time. She still works on Tuesdays and Thursdays. She still stays busy. Her husband, who is 66, still works 4 days per week.   2. Activities: She has not been walking much in the warm weather.   3. Smoking, alcohol, or drugs: None 4. Primary Care Provider: Dr. Carol Ada, [facility] [place]: There are no other significant problems involving Shelby Lin other body systems.   Objective:  Vital Signs:  BP 132/80   Pulse 86   Wt 150 lb 6.4 oz (68.2 kg)   BMI 26.22 kg/m     PHYSICAL EXAMINATION  Constitutional: Shelby Lin looks good today. She is very alert, bright, upbeat, has a good sense of humor, has a normal affect, and has good insight. Her weight has increased 3.75 pounds.  Eyes: There is no obvious arcus or proptosis. Moisture appears normal. Mouth: The oropharynx and tongue appear normal. Oral moisture is normal. There is no oral hyperpigmentation. Neck: The neck appears to be visibly normal. No carotid bruits are noted. The thyroid gland is again within normal limits at about 18-20 grams in size. The consistency of the thyroid gland is normal. The thyroid gland is not tender to palpation. Lungs: The lungs are clear to auscultation. Air movement is good. Heart: Heart rate and rhythm are regular. Heart sounds S1 and S2 are normal. I did not appreciate any pathologic cardiac murmurs. Abdomen: The abdomen is somewhat enlarged. Bowel sounds are normal. There is no obvious hepatomegaly, splenomegaly, or other mass effect. The abdomen is not tender to  palpation. Arms: Muscle size and bulk are normal for age.  Hands: There is trace-1+ tremor. Phalangeal and metacarpophalangeal joints are normal. Palmar muscles are low-normal. Palmar moisture is also normal. There is trace palmar erythema. Palms are warm. Legs: Muscles appear normal for age. No edema is present.  Neurologic: Strength is normal for age in both the upper and lower extremities. Muscle tone is normal. Sensation to touch is normal in the legs.   LAB DATA:   Labs 10/13/16: HbA1c 6.1%, CBG 122  Labs 10/07/16: TSH 2.63, free T4 1.3, free T3 2.9; 25-OH vitamin D 47  Labs 06/05/16: HbA1c 5.8%, fasting glucose 120; TSH  1.40, free T4 1.1, free T3 2.7  Labs 02/22/16: HbA1c 6.4%, CBG 104, C-peptide 1.57 (ref 080-3.85); PTH 28, calcium 10, 25-OH vitamin D 45; TSH 0.16, free T4 1.3, free T3 2.8; CMP normal  Labs 11/11/15: TSH 0.05, free T4 1.5, free T3 3.6  Labs 08/14/15: HbA1c 5.8%  Labs 04/27/15: HbA1c 6.3%; TSH 1.04, free T4 1.3, free T3 2.8; PTH 45, calcium 9.4, 25-OH vitamin D 19  Labs 04/24/15: C-peptide 1.43 (normal 0.80-3.90)  Labs 12/04/14: HbA1c 6.1%  Labs 09/26/14 at 12:01 PM: CMP normal; HbA1c 6.2%;CBC normal, iron 81; ACTH 21, cortisol 12.9; vitamin B12 705 (normal 211-911)  09/19/14: TSH 1.640, free T4 1.03, free T3 2.6; Calcium 9.3, PTH 33 (increased from 26 in March), 25-OH vitamin D 21 (decreased from 23 in march)  04/28/14: TSH 0.081, free T4 1.47, free T3 3.3; calcium 9.4, PTH 26, 25-OH vitamin D 23  10/27/13: TSH 1.087, free T4 1.04, free T3 2.7; Calcium 9.6, PTH 32, 25-hydroxyvitamin D 34  05/18/13: TSH 0.052, free T4 1.24, free T3 3.2  04/28/13: TSH 0.426, free T4 1.24, free T3 3.2; calcium 9.7, 25-hydroxy vitamin D 36  10/08/12: TSH 2.106, free T4 1.05, free T3 2.6; 25-OH vitamin D 37, PTH 39.4, calcium 9.1  04/02/12: TSH 1.073, free T4 1.31, free T3 2.9, calcium 9.5, 25-hydroxy vitamin D 31 - PTH was ordered but not done. I have re-ordered it this  morning.  09/30/11: TSH 8.095, free T4 1.16, free T3 2.4. PTH 24, calcium 9.3, 25-hydroxy vitamin D 33, 1,25-dihydroxy vitamin D 95 11/21/10: Calcium was 9.5. Her PTH was 37.6. 25-hydroxy vitamin D was 41. 1, 25-dihydroxy vitamin D was 85, which was slightly elevated. B12 was 352 (211-911)    IMAGING:   12/07/14 US thyroid: Both lobes are smaller, with maximum dimensions <2.5 cm. Both lobes are heterogenous in echotexture. The previously seen exophytic, solid, hypoechoic, ill-defined nodule in the left inferior pole remains at 0.9 x 0.4 x 0.5 cm and is unchanged from 08/23/2009.   10/12/12: US thyroid gland: Small 10 x 4 x 5 mm nodule or adjacent lymph node or parathyroid adenoma. Diffuse echogenicity. As I read the [place], there is definitely a lot of echogenicity c/w Hashimoto's thyroiditis. There may be a small nodule that is actually a bit less than one cm in longest dimension. This is unchanged since June 2011.   Assessment and Plan:   ASSESSMENT:  1. Hypothyroid:   A. She has acquired hypothyroidism due to Hashimoto's thyroiditis. During the past 11 years her TFTs have been variable, requiring changes in her Synthroid dosage. Part of the variability was due to flare ups of Hashimoto's disease causing Hashitoxicosis.   B. In September 2017 she was hyperthyroid, so I asked her to skip Synthroid on one day each week. In December her TFTs were elevated, so I changed her Synthroid dose accordingly. Her TFTs in April 2018 were mid-normal.   C. Her TFTs today are lower, but at about the 20% of the normal range. Given the tendency for relatively high thyroid hormone levels to sometimes cause cardiac arrhythmias in older patients, it is reasonable to continue her  current Synthroid dosage.  2. Thyroiditis: The patient's Hashimoto's disease has usually been clinically quiescent, but was subclinically active in the January-March 2016 time period. Her thyroid US in October 2016 showed the heterogeneity c/w  Hashimoto's thyroiditis. We'll see if she loses more thyroid cells over time. 3. Multinodular goiter:   A. Thyroid gland is smaller and is again within normal limits for size today. The waxing and waning of thyroid gland size is c/w evolving Hashimoto's disease. Although some [place] studies have shown a nodule, her [place] study in June 2011 showed either a questionable nodule or demonstrated the echotexture heterogeneity that is commonly seen with Hashimoto's disease. Her [place] studies in August 2014 and October 2016 were essentially unchanged. She did not need a FNA at those times.  B. Her thyroid gland is not enlarged today. I do not palpate any nodular area.  4. Headaches: Headaches appear to be "tension" headaches. They have decreased in both frequency and severity. She does better when she performs her cervical stretching exercises daily.   5. Hot flashes and sweats: These problems recur intermittently.   6-7. Leg pains/vitamin D deficiency disease:   A. Her labs in July 2016 showed that her calcium was normal at 9.3, just below the 50%. Her PTH value was mid-range normal. Her vitamin D was lower. She needed more calcium and vitamin D. I had previously asked her to take Citracal-D at doses of 1200-1500 mg of calcium per day and [phone] IU of vitamin D per day.   B. Her labs in March 2017 showed that her PTH value of 45 was higher, but still mid-normal. Calcium was a bit higher at 9.4. Vitamin D was lower at 19. At that point she agreed to purchase and take the Biotech form of vitamin D, one 50,000 IU capsule each week.   C. Her PTH, calcium and vitamin D were normal in December 2017, although the vitamin D had decreased.   D. She is now taking Biotech vitamin D, 50,000 units each week.  Her vitamin D levels are normal again in August 2018.   8. Meniere's disease: Her vertigo has essentially resolved. Her left ear Meniere's disease has not bothered her for months. Her hearing in the left ear varies.  9.  Hoarseness: The fact that her hoarseness and post nasal drip come and go essentially in parallel indicate that the hoarseness is likely due to flare ups of her allergies. However, several flare ups of Hashimoto's disease have also caused hoarseness in the past. 10. Dyspepsia: This problem is well controlled with Protonix. 11. Hypertension: Her BP has increased since decreasing her walking.   13. Depression: She looks very good today.   14. Fibromyalgia syndrome: This problem remains a major problem for her. Exercise will help her "re-charge her batteries".  15. Fatigue:  This problem has also improved. 16. Prediabetes: Her HbA1c values in July and in October 2016 were elevated into the "pre-diabetes" range. Her HbA1c in March 2017 was higher. Her HbA1c in April was lower after losing weight. Her HbA1c today was higher after stopping exercise and gaining weight.  I again offered her medication and she again declined.  17. Arthritis: I have previously suggested a referral to the rheumatologist of Dr. Thompson Caul choosing in order to determine if any physical therapies or adjunctive therapies will help her. Chrys Racer, however, still does not feel that a referral is needed.  2. Overweight: Shelby Lin is mildly overweight. Unfortunately, after losing 6 pounds at her last visit,  she has re-gained almost 3 of those pounds. The associated co-morbidities of hypertension and prediabetes increased in parallel with her weight gain.   PLAN:  1. Diagnostic: Reviewed HbA1c result and lab results from August. Ordered vitamin D and TFTs prior to next visit. HbA1c at next visit. 2. Therapeutic: Ty to do daily physical activity. Watch what she eats and drinks. Continue Synthroid at current doses. Take MVI daily and Biotech, 50,000 IU per week.  3. Patient education: We again discussed the issues of prediabetes, headaches, Hashimoto's disease, hypothyroidism, fibromyalgia, and fatigue. We discussed what she needs to do to  control her BGs, to include getting more exercise and following our Eat Right Diet. She is not happy that she has gained weight, but is also intelligent enough to do what she needs to do to lose the weight again.   4. Follow-up: 6 months  Level of Service: This visit lasted in excess of 50 minutes. More than 50% of the visit was devoted to counseling.   Tillman Sers, MD, CDE Adult and Pediatric Endocrinology

## 2016-10-13 NOTE — Patient Instructions (Signed)
Follow up visit in 6 months. Please repeat lab tests one week prior.  

## 2016-11-25 ENCOUNTER — Other Ambulatory Visit (INDEPENDENT_AMBULATORY_CARE_PROVIDER_SITE_OTHER): Payer: Self-pay | Admitting: "Endocrinology

## 2016-11-25 DIAGNOSIS — E034 Atrophy of thyroid (acquired): Secondary | ICD-10-CM

## 2017-01-21 ENCOUNTER — Institutional Professional Consult (permissible substitution): Payer: Medicare Other | Admitting: Internal Medicine

## 2017-02-10 ENCOUNTER — Other Ambulatory Visit: Payer: Self-pay | Admitting: Family Medicine

## 2017-02-10 DIAGNOSIS — Z1231 Encounter for screening mammogram for malignant neoplasm of breast: Secondary | ICD-10-CM

## 2017-02-11 ENCOUNTER — Ambulatory Visit (HOSPITAL_BASED_OUTPATIENT_CLINIC_OR_DEPARTMENT_OTHER)
Admission: RE | Admit: 2017-02-11 | Discharge: 2017-02-11 | Disposition: A | Discharge status: Home / Self Care | Payer: Medicare Other | Source: Ambulatory Visit | Attending: Internal Medicine | Admitting: Internal Medicine

## 2017-02-11 ENCOUNTER — Telehealth: Payer: Self-pay | Admitting: Internal Medicine

## 2017-02-11 ENCOUNTER — Ambulatory Visit: Payer: Medicare Other | Admitting: Internal Medicine

## 2017-02-11 ENCOUNTER — Encounter: Payer: Self-pay | Admitting: Internal Medicine

## 2017-02-11 VITALS — BP 118/60 | HR 97 | Ht 64.5 in | Wt 152.8 lb

## 2017-02-11 DIAGNOSIS — R0602 Shortness of breath: Secondary | ICD-10-CM

## 2017-02-11 DIAGNOSIS — R918 Other nonspecific abnormal finding of lung field: Secondary | ICD-10-CM | POA: Insufficient documentation

## 2017-02-11 DIAGNOSIS — R0609 Other forms of dyspnea: Secondary | ICD-10-CM

## 2017-02-11 DIAGNOSIS — J432 Centrilobular emphysema: Secondary | ICD-10-CM | POA: Insufficient documentation

## 2017-02-11 DIAGNOSIS — J479 Bronchiectasis, uncomplicated: Secondary | ICD-10-CM | POA: Diagnosis not present

## 2017-02-11 DIAGNOSIS — I700 Atherosclerosis of aorta: Secondary | ICD-10-CM | POA: Insufficient documentation

## 2017-02-11 DIAGNOSIS — Z87891 Personal history of nicotine dependence: Secondary | ICD-10-CM | POA: Diagnosis not present

## 2017-02-11 DIAGNOSIS — I2510 Atherosclerotic heart disease of native coronary artery without angina pectoris: Secondary | ICD-10-CM | POA: Insufficient documentation

## 2017-02-11 DIAGNOSIS — J439 Emphysema, unspecified: Secondary | ICD-10-CM | POA: Diagnosis not present

## 2017-02-11 NOTE — Patient Instructions (Addendum)
ICD-10-CM   1. Dyspnea on exertion R06.09   2. History of smoking 10-25 pack years Z87.891    Need to assess if you have copd or real reason for shortness of breath  Plan Walk test on Room air 02/11/2017 Stop incruse Do HRCT next few week Do full PFT next few weeks Office staff to call [facility] and get old pft result if any  Followup Regroup with Dr Chase Caller or an APP but after above

## 2017-02-11 NOTE — Telephone Encounter (Signed)
Let Jersee Winiarski Leclaire\ know that indeed 2013 PFT is c/w asthma v copd. So a) do notstop incruse but b-) keep up appt iwht PFT but on incruse and hrct  And c) do blood work for alpha 1 phenotype at convenience  Dr. Brand Males, M.D., [facility].C.P Pulmonary and Critical Care Medicine Staff Physician, [place] Director - Interstitial Lung Disease  Program  Pulmonary [place] at [place], [place], [address]  Pager: [phone], If no answer or between  15:00h - 7:00h: call [phone] Telephone: [phone]

## 2017-02-11 NOTE — Addendum Note (Signed)
Addended by: Lorretta Harp on: 02/11/2017 09:34 AM   Modules accepted: Orders

## 2017-02-11 NOTE — Addendum Note (Signed)
Addended by: Lorretta Harp on: 02/11/2017 10:46 AM   Modules accepted: Orders

## 2017-02-11 NOTE — Progress Notes (Addendum)
Subjective:    [name], female    DOB: 04-17-1940, 76 y.o.   MRN: [id number] [facility] Carol Ada, MD   HPI  IOV 02/11/2017  Chief Complaint  Patient presents with  . Advice Only    Self referral for COPD. States she was dx by Dr. Carol Ada x2 years ago. Has SOB with exertion and anxiety and has an occ. cough. Denies any CP.   76 year old female with limited smoking history and quit many years ago.  She tells me for the last few years she has had insidious onset of shortness of breath that is mild to moderate in severity brought on with exertion relieved by rest.  It is stable overall.  Does not much of associated cough except except very mild.  She does have associated ACE inhibitor intake.  The dyspnea is stable.  A few years ago based on pulmonary function test done?  At [facility] health location but I do not have the results patient was started on Spiriva.  There is a chest x-ray in the system from few to several years ago that shows hyperinflation personally visualized and confirmed the findings.  Few to several months ago was switched to incruse/.  She honestly does not know if the symptoms are helping her.  She denies any wheezing orthopnea proximal nocturnal dyspnea hemoptysis fever chills edema.  Some 6 weeks ago she describes what sounds like an asthma or COPD exacerbation treated with antibiotics and prednisone x2 and then back to baseline currently.  At this point in time she wants pulmonary support for her diagnosis of COPD.  Her current symptoms are detailed below.  Blood work August 2018 normal hemoglobin A1c and TSH  2013 PFT o  CAT COPD Symptom & Quality of Life Score ([place]) 0 is no burden. 5 is highest burden 02/11/2017   Never Cough -> Cough all the time 1  No phlegm in chest -> Chest is full of phlegm 1  No chest tightness -> Chest feels very tight 3  No dyspnea for 1 flight stairs/hill -> Very dyspneic for 1 flight of stairs 4  No  limitations for ADL at home -> Very limited with ADL at home 1  Confident leaving home -> Not at all confident leaving home 0  Sleep soundly -> Do not sleep soundly because of lung condition 3  Lots of Energy -> No energy at all 3  TOTAL Score (max 40)  16     Walking desaturation test on 02/11/2017 185 feet x 3 laps on ROOM AIR:  did not desaturate. Rest pulse ox was 96%, final pulse ox was 95%. HR response 99/min at rest to 105/min at peak exertion. Patient [name]  Did not Desaturate < 88% . Cleophas Dunker [place] did not  Desaturated </= 3% points. [name] yes did get tachyardic   2013 PFT obtained after she left and persinally visualie  C.w Gold sstage 3 copd with BD response - fev1 0.95L/42%, +28%, Ratio 46, DLCO 41%    has a past medical history of Arthritis, Combined hyperlipidemia, COPD (chronic obstructive pulmonary disease) ([place]), Coronary artery spasm (H[facility]), Dupuytren's contracture, Dyspepsia, Familial tremor, Fatigue, Fibromyalgia syndrome, GERD (gastroesophageal reflux disease), Hypertension, Hypothyroidism, acquired, autoimmune, Multinodular goiter (nontoxic), Tachycardia, Thyroiditis, autoimmune, and Wears glasses.   reports that she quit smoking about 15 years ago. Her smoking use included cigarettes. She has a 10.50 pack-year smoking history. she has never used smokeless tobacco.  Past  Surgical History:  Procedure Laterality Date  . BREAST LUMPECTOMY  1990   left  . BREAST LUMPECTOMY WITH NEEDLE LOCALIZATION Right 02/28/2013   Procedure: RIGHT BREAST NEEDLE LOCALIZATION LUMPECTOMY ;  Surgeon: Edward Jolly, MD;  Location: [place];  Service: General;  Laterality: Right;  . CHOLECYSTECTOMY  2008   lapcholi  . DILATION AND CURETTAGE OF UTERUS    . LEFT HEART CATHETERIZATION WITH CORONARY ANGIOGRAM N/A 01/26/2013   Procedure: LEFT HEART CATHETERIZATION WITH CORONARY ANGIOGRAM;  Surgeon: Candee Furbish, MD;  Location: [facility] CATH LAB;  Service:  Cardiovascular;  Laterality: N/A;  . PARTIAL HYSTERECTOMY  1971  . TONSILLECTOMY      Allergies  Allergen Reactions  . Amoxicillin Hives  . Codeine Nausea And Vomiting  . Lorabid [Loracarbef] Hives  . Sulfa Antibiotics Hives    Immunization History  Administered Date(s) Administered  . Influenza, High Dose Seasonal PF 12/12/2016    Family History  Problem Relation Age of Onset  . Cancer Mother   . Thyroid disease Neg Hx   . Hypertension Mother   . Hypertension Sister   . Diabetes Sister   . Heart attack Father   . Sudden death Father   . Stroke Mother   . Stroke Sister      Current Outpatient Medications:  .  Cyanocobalamin (VITAMIN B 12 PO), Take by mouth daily., Disp: , Rfl:  .  DULoxetine (CYMBALTA) 60 MG capsule, Take 60 mg by mouth daily., Disp: , Rfl:  .  enalapril (VASOTEC) 2.5 MG tablet, Take 2.5 mg by mouth daily. , Disp: , Rfl:  .  Multiple Vitamin (MULTIVITAMIN) tablet, Take 1 tablet by mouth daily.  , Disp: , Rfl:  .  omeprazole (PRILOSEC) 20 MG capsule, Take 20 mg by mouth daily., Disp: , Rfl:  .  rosuvastatin (CRESTOR) 5 MG tablet, Take 5 mg by mouth daily., Disp: , Rfl:  .  SYNTHROID 88 MCG tablet, TAKE 1 TABLET (88 MCG TOTAL) BY MOUTH DAILY BEFORE BREAKFAST., Disp: 30 tablet, Rfl: 5 .  umeclidinium bromide (INCRUSE ELLIPTA) 62.5 MCG/INH AEPB, Inhale 1 puff into the lungs daily., Disp: , Rfl:  .  VITAMIN D, CHOLECALCIFEROL, PO, Take by mouth daily., Disp: , Rfl:    Review of Systems  Constitutional: Negative for fever and unexpected weight change.  HENT: Positive for ear pain. Negative for congestion, dental problem, nosebleeds, postnasal drip, rhinorrhea, sinus pressure, sneezing, sore throat and trouble swallowing.   Eyes: Negative for redness and itching.  Respiratory: Positive for cough, chest tightness, shortness of breath and wheezing.   Cardiovascular: Negative for palpitations and leg swelling.  Gastrointestinal: Negative for nausea and  vomiting.  Genitourinary: Negative for dysuria.  Musculoskeletal: Negative for joint swelling.  Skin: Negative for rash.  Allergic/Immunologic: Negative.  Negative for environmental allergies, food allergies and immunocompromised state.  Neurological: Positive for headaches.  Hematological: Does not bruise/bleed easily.  Psychiatric/Behavioral: Positive for dysphoric mood. The patient is nervous/anxious.        Objective:   Physical Exam  Constitutional: She is oriented to person, place, and time. She appears well-developed and well-nourished. No distress.  HENT:  Head: Normocephalic and atraumatic.  Right Ear: External ear normal.  Left Ear: External ear normal.  Mouth/Throat: Oropharynx is clear and moist. No oropharyngeal exudate.  Eyes: Conjunctivae and EOM are normal. Pupils are equal, round, and reactive to light. Right eye exhibits no discharge. Left eye exhibits no discharge. No scleral icterus.  Neck: Normal range of  motion. Neck supple. No JVD present. No tracheal deviation present. No thyromegaly present.  Cardiovascular: Normal rate, regular rhythm, normal heart sounds and intact distal pulses. Exam reveals no gallop and no friction rub.  No murmur heard. Pulmonary/Chest: Effort normal and breath sounds normal. No respiratory distress. She has no wheezes. She has no rales. She exhibits no tenderness.  Abdominal: Soft. Bowel sounds are normal. She exhibits no distension and no mass. There is no tenderness. There is no rebound and no guarding.  Musculoskeletal: Normal range of motion. She exhibits no edema or tenderness.  Lymphadenopathy:    She has no cervical adenopathy.  Neurological: She is alert and oriented to person, place, and time. She has normal reflexes. No cranial nerve deficit. She exhibits normal muscle tone. Coordination normal.  Skin: Skin is warm and dry. No rash noted. She is not diaphoretic. No erythema. No pallor.  Psychiatric: She has a normal mood and  affect. Her behavior is normal. Judgment and thought content normal.  Vitals reviewed.   Vitals:   02/11/17 0905  BP: 118/60  Pulse: 97  SpO2: 98%  Weight: 152 lb 12.8 oz (69.3 kg)  Height: 5' 4.5" (1.638 m)   Estimated body mass index is 25.82 kg/m as calculated from the following:   Height as of this encounter: 5' 4.5" (1.638 m).   Weight as of this encounter: 152 lb 12.8 oz (69.3 kg).        Assessment & Plan:     ICD-10-CM   1. Dyspnea on exertion R06.09   2. History of smoking 10-25 pack years Z87.891      Need to assess if you have copd or real reason for shortness of breath  Plan Walk test on Room air 02/11/2017 Stop incruse Do HRCT next few week Do full PFT next few weeks Office staff to call [facility] and get old pft result if any  Followup Regroup with Dr Chase Caller or an APP but after above    Dr. Brand Males, M.D., [facility].C.P Pulmonary and Critical Care Medicine Staff Physician, [place] Director - Interstitial Lung Disease  Program  Pulmonary [place] at [place], [place], [address]  Pager: [phone], If no answer or between  15:00h - 7:00h: call [phone] Telephone: [phone]

## 2017-02-13 NOTE — Telephone Encounter (Signed)
Called pt letting her know that MR had looked at the PFT she had done at [facility] and stated to her that it looked like asthma v copd.  Pt expressed understanding.     When I mentioned asthma, pt stated to me that as a child but stated her father said she had outgrown it but pt stated she was not convinced she had outgrown it.  Told pt to continue taking the incruse. Pt had the HRCT done 12/19.  Told pt to keep her appt scheduled with the PFT and that we needed her to go to the lab to have the alpha-1 with phenotype done.  Pt expressed understanding. Lab order placed. Nothing further needed at this time.

## 2017-02-18 ENCOUNTER — Other Ambulatory Visit: Payer: Medicare Other

## 2017-02-18 DIAGNOSIS — R0602 Shortness of breath: Secondary | ICD-10-CM

## 2017-02-27 LAB — ALPHA-1 ANTITRYPSIN PHENOTYPE: A1 ANTITRYPSIN SER: 129 mg/dL (ref 83–199)

## 2017-03-11 ENCOUNTER — Ambulatory Visit
Admission: RE | Admit: 2017-03-11 | Discharge: 2017-03-11 | Disposition: A | Discharge status: Home / Self Care | Payer: Medicare Other | Source: Ambulatory Visit | Attending: Family Medicine | Admitting: Family Medicine

## 2017-03-11 DIAGNOSIS — Z1231 Encounter for screening mammogram for malignant neoplasm of breast: Secondary | ICD-10-CM

## 2017-04-01 ENCOUNTER — Ambulatory Visit (INDEPENDENT_AMBULATORY_CARE_PROVIDER_SITE_OTHER): Payer: Medicare Other | Admitting: Internal Medicine

## 2017-04-01 ENCOUNTER — Ambulatory Visit: Payer: Medicare Other | Admitting: Internal Medicine

## 2017-04-01 ENCOUNTER — Encounter: Payer: Self-pay | Admitting: Internal Medicine

## 2017-04-01 VITALS — BP 110/56 | HR 88 | Ht 64.0 in | Wt 154.0 lb

## 2017-04-01 DIAGNOSIS — R911 Solitary pulmonary nodule: Secondary | ICD-10-CM | POA: Diagnosis not present

## 2017-04-01 DIAGNOSIS — Z01811 Encounter for preprocedural respiratory examination: Secondary | ICD-10-CM

## 2017-04-01 DIAGNOSIS — R0609 Other forms of dyspnea: Secondary | ICD-10-CM

## 2017-04-01 DIAGNOSIS — J449 Chronic obstructive pulmonary disease, unspecified: Secondary | ICD-10-CM | POA: Diagnosis not present

## 2017-04-01 MED ORDER — FLUTICASONE FUROATE-VILANTEROL 100-25 MCG/INH IN AEPB: 1.0000 | INHALATION_SPRAY | Freq: Every day | RESPIRATORY_TRACT | 6 refills | Status: DC

## 2017-04-01 MED ORDER — ALBUTEROL SULFATE HFA 108 (90 BASE) MCG/ACT IN AERS: 2.0000 | INHALATION_SPRAY | Freq: Four times a day (QID) | RESPIRATORY_TRACT | 6 refills | Status: DC | PRN

## 2017-04-01 NOTE — Addendum Note (Signed)
Addended by: Lorretta Harp on: 04/01/2017 10:45 AM   Modules accepted: Orders

## 2017-04-01 NOTE — Progress Notes (Signed)
Spirometry pre and post and Dlco done today. ?

## 2017-04-01 NOTE — Patient Instructions (Addendum)
Stage 3 severe COPD by GOLD classification ([place])   - continue incruse  - add breo daily at night  -0 use albuterol as needed - talk to PCP Carol Ada, MD and come off vasotec, take another class of drug for bp  Nodule of middle lobe of right lung - 2mm in Right middle lobe  - repeat CT chest wo contrast dec 2019  Preop respiratory exam - let Dr Percell Miller team know you have copd as above - ok for propofol for procedure under anesthesia support   Followup 6 months or sooner if needed

## 2017-04-01 NOTE — Progress Notes (Signed)
Subjective:     Patient ID: JORDAIN RADIN, female   DOB: Feb 21, 1941, 77 y.o.   MRN: [id number]  HPI IOV 02/11/2017  Chief Complaint  Patient presents with  . Advice Only    Self referral for COPD. States she was dx by Dr. Carol Ada x2 years ago. Has SOB with exertion and anxiety and has an occ. cough. Denies any CP.   77 year old female with limited smoking history and quit many years ago.  She tells me for the last few years she has had insidious onset of shortness of breath that is mild to moderate in severity brought on with exertion relieved by rest.  It is stable overall.  Does not much of associated cough except except very mild.  She does have associated ACE inhibitor intake.  The dyspnea is stable.  A few years ago based on pulmonary function test done?  At [facility] health location but I do not have the results patient was started on Spiriva.  There is a chest x-ray in the system from few to several years ago that shows hyperinflation personally visualized and confirmed the findings.  Few to several months ago was switched to incruse/.  She honestly does not know if the symptoms are helping her.  She denies any wheezing orthopnea proximal nocturnal dyspnea hemoptysis fever chills edema.  Some 6 weeks ago she describes what sounds like an asthma or COPD exacerbation treated with antibiotics and prednisone x2 and then back to baseline currently.  At this point in time she wants pulmonary support for her diagnosis of COPD.  Her current symptoms are detailed below.  Blood work August 2018 normal hemoglobin A1c and TSH  OV 04/01/2017  Chief Complaint  Patient presents with  . Follow-up    PFT done today and HRCT done 02/11/17.  Pt states she has been doing good.  No real complaints of cough other than acid reflux.   Follow-up investigation for COPD.  Since her last visit she had CT scan of the chest that does show bronchiectasis associated with emphysema.  Pulmonary function test today shows  Gold stage II COPD but with significant bronchodilator response.  She feels good and stable.  Cough is very minimal.  Med review shows that she is just on anticholinergic.  She is not on inhaled corticosteroid or bronchodilator.  I noticed that she is on ACE inhibitor.  She tells me that she can cough significantly after a viral infection.  However at baseline she only has mild cough.  She room was having asthma as a child but she says she outgrew it.  Incidental finding of 5 mm right middle lobe nodule in December 2018 CT chest  New issue of her having to undergo upper endoscopy by Dr. Oletta Lamas at [place].  She has had colonoscopy a few years ago under propofol and she tolerated this fine.  She says gastroenterology is not aware of her COPD diagnosis and the severity of impairment of the lung function although she is quite functional and has tolerated sedation before.  CAT COPD Symptom & Quality of Life Score ([employer] trademark) 0 is no burden. 5 is highest burden 02/11/2017   Never Cough -> Cough all the time 1  No phlegm in chest -> Chest is full of phlegm 1  No chest tightness -> Chest feels very tight 3  No dyspnea for 1 flight stairs/hill -> Very dyspneic for 1 flight of stairs 4  No limitations for ADL at home -> Very  limited with ADL at home 1  Confident leaving home -> Not at all confident leaving home 0  Sleep soundly -> Do not sleep soundly because of lung condition 3  Lots of Energy -> No energy at all 3  TOTAL Score (max 40)  16    Alpha 1 - 129 and MM 02/18/18   Walking desaturation test on 02/11/2017 185 feet x 3 laps on ROOM AIR:  did not desaturate. Rest pulse ox was 96%, final pulse ox was 95%. HR response 99/min at rest to 105/min at peak exertion. Patient SENETRA DILLIN  Did not Desaturate < 88% . Cleophas Dunker [place] did not  Desaturated </= 3% points. Breindy Meadow [place] yes did get tachyardic   IMPRESSION: ct chest 2018 1. Minimal scattered cylindrical bronchiectasis with  associated minimal tree-in-bud opacity in the medial upper lobes and medial right middle lobe. Findings could be due to atypical mycobacterial infection (MAI) . 2. Mild-to-moderate centrilobular emphysema with mild diffuse bronchial wall thickening, compatible with the provided history of COPD. 3. Two small solid pulmonary nodules in the right lung, largest with average diameter 5 mm in the right middle lobe. No follow-up needed if patient is low-risk (and has no known or suspected primary neoplasm). Non-contrast chest CT can be considered in 12 months if patient is high-risk. This recommendation follows the consensus statement: Guidelines for Management of Incidental Pulmonary Nodules Detected on CT Images: From the Fleischner Society 2017; Radiology 2017; 284:228-243. 4. One vessel coronary atherosclerosis.  Aortic Atherosclerosis (ICD10-I70.0) and Emphysema (ICD10-J43.9).   Electronically Signed   By: Ilona Sorrel M.D.   On: 02/11/2017 16:30   has a past medical history of Arthritis, Combined hyperlipidemia, COPD (chronic obstructive pulmonary disease) ([place]), Coronary artery spasm ([place]), Dupuytren's contracture, Dyspepsia, Familial tremor, Fatigue, Fibromyalgia syndrome, GERD (gastroesophageal reflux disease), Hypertension, Hypothyroidism, acquired, autoimmune, Multinodular goiter (nontoxic), Tachycardia, Thyroiditis, autoimmune, and Wears glasses.   reports that she quit smoking about 16 years ago. Her smoking use included cigarettes. She has a 10.50 pack-year smoking history. she has never used smokeless tobacco.  Past Surgical History:  Procedure Laterality Date  . BREAST EXCISIONAL BIOPSY Left    benign  . BREAST EXCISIONAL BIOPSY Right    milk gland removed  . BREAST LUMPECTOMY  1990   left  . BREAST LUMPECTOMY WITH NEEDLE LOCALIZATION Right 02/28/2013   Procedure: RIGHT BREAST NEEDLE LOCALIZATION LUMPECTOMY ;  Surgeon: Edward Jolly, MD;  Location: [place];  Service: General;  Laterality: Right;  . CHOLECYSTECTOMY  2008   lapcholi  . DILATION AND CURETTAGE OF UTERUS    . LEFT HEART CATHETERIZATION WITH CORONARY ANGIOGRAM N/A 01/26/2013   Procedure: LEFT HEART CATHETERIZATION WITH CORONARY ANGIOGRAM;  Surgeon: Candee Furbish, MD;  Location: [facility] CATH LAB;  Service: Cardiovascular;  Laterality: N/A;  . PARTIAL HYSTERECTOMY  1971  . TONSILLECTOMY      Allergies  Allergen Reactions  . Amoxicillin Hives  . Codeine Nausea And Vomiting  . Lorabid [Loracarbef] Hives  . Sulfa Antibiotics Hives    Immunization History  Administered Date(s) Administered  . Influenza, High Dose Seasonal PF 12/12/2016    Family History  Problem Relation Age of Onset  . Cancer Mother   . Hypertension Mother   . Stroke Mother   . Heart attack Father   . Sudden death Father   . Hypertension Sister   . Diabetes Sister   . Stroke Sister   . Thyroid disease Neg Hx  Current Outpatient Medications:  .  Cyanocobalamin (VITAMIN B 12 PO), Take by mouth daily., Disp: , Rfl:  .  DULoxetine (CYMBALTA) 60 MG capsule, Take 60 mg by mouth daily., Disp: , Rfl:  .  enalapril (VASOTEC) 2.5 MG tablet, Take 2.5 mg by mouth daily. , Disp: , Rfl:  .  Multiple Vitamin (MULTIVITAMIN) tablet, Take 1 tablet by mouth daily.  , Disp: , Rfl:  .  omeprazole (PRILOSEC) 20 MG capsule, Take 20 mg by mouth daily., Disp: , Rfl:  .  rosuvastatin (CRESTOR) 5 MG tablet, Take 5 mg by mouth daily., Disp: , Rfl:  .  SYNTHROID 88 MCG tablet, TAKE 1 TABLET (88 MCG TOTAL) BY MOUTH DAILY BEFORE BREAKFAST., Disp: 30 tablet, Rfl: 5 .  VITAMIN D, CHOLECALCIFEROL, PO, Take 50,000 Units by mouth once a week. , Disp: , Rfl:    Review of Systems     Objective:   Physical Exam  Constitutional: She is oriented to person, place, and time. She appears well-developed and well-nourished. No distress.  HENT:  Head: Normocephalic and atraumatic.  Right Ear: External ear normal.  Left Ear:  External ear normal.  Mouth/Throat: Oropharynx is clear and moist. No oropharyngeal exudate.  Eyes: Conjunctivae and EOM are normal. Pupils are equal, round, and reactive to light. Right eye exhibits no discharge. Left eye exhibits no discharge. No scleral icterus.  Neck: Normal range of motion. Neck supple. No JVD present. No tracheal deviation present. No thyromegaly present.  Cardiovascular: Normal rate, regular rhythm, normal heart sounds and intact distal pulses. Exam reveals no gallop and no friction rub.  No murmur heard. Pulmonary/Chest: Effort normal and breath sounds normal. No respiratory distress. She has no wheezes. She has no rales. She exhibits no tenderness.  Abdominal: Soft. Bowel sounds are normal. She exhibits no distension and no mass. There is no tenderness. There is no rebound and no guarding.  Musculoskeletal: Normal range of motion. She exhibits no edema or tenderness.  Lymphadenopathy:    She has no cervical adenopathy.  Neurological: She is alert and oriented to person, place, and time. She has normal reflexes. No cranial nerve deficit. She exhibits normal muscle tone. Coordination normal.  Skin: Skin is warm and dry. No rash noted. She is not diaphoretic. No erythema. No pallor.  Psychiatric: She has a normal mood and affect. Her behavior is normal. Judgment and thought content normal.  Vitals reviewed.   Vitals:   04/01/17 0957  BP: (!) 110/56  Pulse: 88  SpO2: 96%  Weight: 154 lb (69.9 kg)  Height: 5\' 4"  (1.626 m)    Estimated body mass index is 26.43 kg/m as calculated from the following:   Height as of this encounter: 5\' 4"  (1.626 m).   Weight as of this encounter: 154 lb (69.9 kg).     Assessment:       ICD-10-CM   1. Stage 3 severe COPD by GOLD classification ([place]) J44.9   2. Nodule of middle lobe of right lung R91.1   3. Preop respiratory exam Z01.811        Plan:     Stage 3 severe COPD by GOLD classification ([place])   - continue incruse   - add breo daily at night  -0 use albuterol as needed - talk to PCP Carol Ada, MD and come off vasotec, take another class of drug for bp  Nodule of middle lobe of right lung - 33mm in Right middle lobe  - repeat CT chest wo contrast  dec 2019  Preop respiratory exam - let Dr Percell Miller team know you have copd as above - ok for propofol for procedure under anesthesia support   Followup 6 months or sooner if needed   Dr. Brand Males, M.D., [facility].C.P Pulmonary and Critical Care Medicine Staff Physician, [place] Director - Interstitial Lung Disease  Program  Pulmonary [place] at [place], [place], [address]  Pager: [phone], If no answer or between  15:00h - 7:00h: call [phone] Telephone: [phone]

## 2017-04-20 ENCOUNTER — Ambulatory Visit (INDEPENDENT_AMBULATORY_CARE_PROVIDER_SITE_OTHER): Payer: Medicare Other | Admitting: "Endocrinology

## 2017-04-20 ENCOUNTER — Encounter (INDEPENDENT_AMBULATORY_CARE_PROVIDER_SITE_OTHER): Payer: Self-pay | Admitting: "Endocrinology

## 2017-04-20 VITALS — BP 110/70 | HR 86 | Ht 63.78 in | Wt 154.0 lb

## 2017-04-20 DIAGNOSIS — R7303 Prediabetes: Secondary | ICD-10-CM

## 2017-04-20 DIAGNOSIS — E663 Overweight: Secondary | ICD-10-CM

## 2017-04-20 DIAGNOSIS — E063 Autoimmune thyroiditis: Secondary | ICD-10-CM | POA: Diagnosis not present

## 2017-04-20 DIAGNOSIS — E559 Vitamin D deficiency, unspecified: Secondary | ICD-10-CM | POA: Diagnosis not present

## 2017-04-20 DIAGNOSIS — E034 Atrophy of thyroid (acquired): Secondary | ICD-10-CM

## 2017-04-20 DIAGNOSIS — G8929 Other chronic pain: Secondary | ICD-10-CM

## 2017-04-20 DIAGNOSIS — E049 Nontoxic goiter, unspecified: Secondary | ICD-10-CM | POA: Diagnosis not present

## 2017-04-20 DIAGNOSIS — I10 Essential (primary) hypertension: Secondary | ICD-10-CM

## 2017-04-20 DIAGNOSIS — R51 Headache: Secondary | ICD-10-CM | POA: Diagnosis not present

## 2017-04-20 DIAGNOSIS — M797 Fibromyalgia: Secondary | ICD-10-CM

## 2017-04-20 DIAGNOSIS — R519 Headache, unspecified: Secondary | ICD-10-CM

## 2017-04-20 DIAGNOSIS — R1013 Epigastric pain: Secondary | ICD-10-CM

## 2017-04-20 LAB — POCT GLYCOSYLATED HEMOGLOBIN (HGB A1C): Hemoglobin A1C: 6

## 2017-04-20 LAB — POCT GLUCOSE (DEVICE FOR HOME USE): Glucose Fasting, POC: 112 mg/dL — AB (ref 70–99)

## 2017-04-20 MED ORDER — LEVOTHYROXINE SODIUM 88 MCG PO TABS: 88.0000 ug | ORAL_TABLET | Freq: Every day | ORAL | 5 refills | Status: DC

## 2017-04-20 NOTE — Patient Instructions (Signed)
Follow up visit in 6 months. Please repeat lab tests 2 weeks prior. 

## 2017-04-20 NOTE — Progress Notes (Signed)
Subjective:  Patient Name: Shelby Lin Date of Birth: 1940/05/07  MRN: [id number]  Shelby Lin  presents to the office today for follow-up of her hypothyroidism secondary to Hashimoto's disease, questionable nodular goiter, tachycardia, tremor, dyspepsia, fatigue, fibromyalgia, hyperlipidemia, night sweats, tremor, and pre-diabetes.  HISTORY OF PRESENT ILLNESS:   Shelby Lin is a 77 y.o. Caucasian woman.  Shelby Lin was unaccompanied.  1. Shelby Lin was referred to me on 03/25/2005 by her primary care provider, Shelby Lin, for evaluation of her thyroid problems. Shelby Lin was 74 at that time.   A. At about age 69 the patient developed thyroid problems and was put on a thyroid medicine. Later when she became pregnant her obstetrician took her off that medicine. At about age 89, she developed severe fatigue and weight gain. She was put back on Synthroid. Her dose of Synthroid was 88 mcg per day for many years. In January 2006, she had fluctuations in her thyroid function tests. Synthroid was increased to 100 mcg and later to 125 mcg per day. The Synthroid dose was then reduced to 75 mcg. After blood tests about 9 months prior to her first appointment with me, the patient was put back on a dose of 88 mcg per day. When the patient was having fluctuations in her thyroid tests, her thyroid gland became visibly enlarged and felt full and tender to palpation. She was also more hoarse during those periods. Hoarseness came and went in parallel with the thyroid gland swelling. Ultrasound studies of the thyroid in January 2006 and January 2007 showed multinodular goiter.   B. Thyroid function tests performed at that first visit showed a TSH of 4.139, free T4 1.22, and free T3 of 2.7. Because any TSH at that time greater than 3.0 was considered to be elevated physiologically, I increased her Synthroid from 88 to 100 mcg per day. TPO antibody was markedly positive at 954.0, c/w the diagnosis of Hashimoto's thyroiditis.  Her TSI level was quite normal at 0.9.   2. During the last 12 years the patient has had several medical issues that we have followed:  A. She has had several flare-ups of Hashimoto's disease, resulting in several changes in her doses of Synthroid. Her Synthroid dose was subsequently decreased to 88 mcg/day in January 2018.   B. Although she was initially thought to have a multinodular goiter, over time it became clear that all but one of the nodules were actually areas of echotexture heterogeneity. She has had one "nodularish" area of her left inferior pole that has remained unchanged since 2011. This area is ill-defined and may also just represent several overlapping areas of heterogeneity.  C. She has had problems with fibromyalgia, arthritis, hypertension, depression, fatigue, tremor, hyperlipidemia, reflux, dyspepsia, and vitamin D deficiency.   D. In August 2016 her HbA1c was 6.2%, c/w prediabetes. Follow up HbA1c in October 2016 was 6.1%. The patient refused to take metformin because three of her relatives lost their scalp hair when they took metformin.    3. The patient's last [facility] visit was on 10/13/16. In the interim she has been healthy. Her allergies have been "okay".     A. Her tension headaches are better and occur  less frequently. She does better when she does her cervical stretching exercises regularly.   B. She still has episodic fatigue, but less. Her energy varies, but has generally been better. Some days when she feels really good she may be much more active than usual, and so may tire  herself out.    C. She still has hot spells or sweats occasionally.   D. Her COPD was better on Spiriva. She is now taking Incruse. She saw [name] at [facility], who added Breo at night.   E. Her depression is "pretty good".  Her family problems are better.     F. She continues to have problems with fibromyalgia and arthritis in her wrists, hands, knees, and feet, worse when it rains.    G. Her feet still bother her a lot. Her feet get red and feel hot in the forefoot area a lot, especially when the weather is warm. .    H. Her Meniere's disease is "fine". She no longer has any ringing and roaring.   I. She remains on her Protonix daily. She still takes Synthroid, 88 mcg/day. She also takes Crestor 5 mg/day, an MVI, and Biotech vitamin D, 50,000 IU/day.   J. She is intermittently hoarse. The hoarseness still comes and goes in association with her allergies.   K. She only has sweet tea at times if she eat out.    4. Pertinent Review of Systems:  Constitutional: "I feel alright."  Her stamina and energy are better overall, but some days are better than others.  Eyes: Vision is good as long as she wears her glasses. She had an eye exam in March 2018. There were no significant eye problems noted.   Neck: The thyroid gland has not seemed to swell lately. The patient has no complaints of anterior neck soreness, tenderness,  pressure, discomfort, or difficulty swallowing.  Heart: She has not had any other chest symptoms. Heart rate increases with exercise or other physical activity. The patient has no other complaints of palpitations, irregular heat beats, chest pain, or chest pressure. Gastrointestinal: Protonix is usually enough to control her acid reflux, indigestion, and dyspepsia. Bowel movements are normal.  Legs: Muscle mass and strength seem normal. Legs are stronger when she walks more. She no longer has episodic leg numbness and burning. She has had some edema occasionally.   Feet: Her feet still burn in the forefeet at times.    GYN: She still gets hot flashes, but the intensity of the hot flashes varies over time.   Emotional/psychiatric: She feels "fine".    Mental: She does "okay" at thinking, paying attention, remembering, and making decisions.   PAST MEDICAL, FAMILY, AND SOCIAL HISTORY:  Past Medical History:  Diagnosis Date  . Arthritis   . Combined hyperlipidemia    . COPD (chronic obstructive pulmonary disease) ([place])   . Coronary artery spasm ([place])   . Dupuytren's contracture   . Dyspepsia   . Familial tremor   . Fatigue   . Fibromyalgia syndrome   . GERD (gastroesophageal reflux disease)   . Hypertension   . Hypothyroidism, acquired, autoimmune   . Multinodular goiter (nontoxic)   . Tachycardia   . Thyroiditis, autoimmune   . Wears glasses     Family History  Problem Relation Age of Onset  . Cancer Mother   . Hypertension Mother   . Stroke Mother   . Heart attack Father   . Sudden death Father   . Hypertension Sister   . Diabetes Sister   . Stroke Sister   . Thyroid disease Neg Hx      Current Outpatient Medications:  .  albuterol (PROVENTIL HFA;VENTOLIN HFA) 108 (90 Base) MCG/ACT inhaler, Inhale 2 puffs into the lungs every 6 (six) hours as needed for wheezing or  shortness of breath., Disp: 1 Inhaler, Rfl: 6 .  Cyanocobalamin (VITAMIN B 12 PO), Take by mouth daily., Disp: , Rfl:  .  DULoxetine (CYMBALTA) 60 MG capsule, Take 60 mg by mouth daily., Disp: , Rfl:  .  fluticasone furoate-vilanterol (BREO ELLIPTA) 100-25 MCG/INH AEPB, Inhale 1 puff into the lungs daily., Disp: 1 each, Rfl: 6 .  losartan-hydrochlorothiazide (HYZAAR) 100-25 MG tablet, Take 1 tablet by mouth daily., Disp: , Rfl:  .  Multiple Vitamin (MULTIVITAMIN) tablet, Take 1 tablet by mouth daily.  , Disp: , Rfl:  .  omeprazole (PRILOSEC) 20 MG capsule, Take 20 mg by mouth daily., Disp: , Rfl:  .  rosuvastatin (CRESTOR) 5 MG tablet, Take 5 mg by mouth daily., Disp: , Rfl:  .  SYNTHROID 88 MCG tablet, TAKE 1 TABLET (88 MCG TOTAL) BY MOUTH DAILY BEFORE BREAKFAST., Disp: 30 tablet, Rfl: 5 .  umeclidinium bromide (INCRUSE ELLIPTA) 62.5 MCG/INH AEPB, Inhale 1 puff into the lungs daily., Disp: , Rfl:  .  VITAMIN D, CHOLECALCIFEROL, PO, Take 50,000 Units by mouth once a week. , Disp: , Rfl:  .  enalapril (VASOTEC) 2.5 MG tablet, Take 2.5 mg by mouth daily. , Disp: , Rfl:    Allergies as of 04/20/2017 - Review Complete 04/01/2017  Allergen Reaction Noted  . Acyclovir and related  04/01/2017  . Amoxicillin Hives 06/18/2010  . Codeine Nausea And Vomiting 02/21/2013  . Lorabid [loracarbef] Hives 06/18/2010  . Sulfa antibiotics Hives 06/18/2010    1. Work and Family: She retired in June 2013, but later went back to work part-time. She still works on Tuesdays and Thursdays. She still stays busy. Her husband, who is 24, still works 4 days per week.   2. Activities: She has not been walking much in the cold weather.   3. Smoking, alcohol, or drugs: None 4. Primary Care Provider: Dr. Carol Lin, [facility] at [place]: There are no other significant problems involving Jillann's other body systems.   Objective:  Vital Signs:  BP 110/70 (BP Location: Left Arm, Patient Position: Sitting, Cuff Size: Large)   Pulse 86   Ht 5' 3.78" (1.62 m)   Wt 154 lb (69.9 kg)   BMI 26.62 kg/m     PHYSICAL EXAMINATION  Constitutional: Velva looks good today. She looks wonderful today. She is very alert, bright, upbeat, has a good sense of humor, has a normal affect, and has good insight. Her weight has increased 4 pounds.  Eyes: There is no obvious arcus or proptosis. Moisture appears normal. Mouth: The oropharynx and tongue appear normal. Oral moisture is normal. There is no oral hyperpigmentation. Neck: The neck appears to be visibly normal. No carotid bruits are noted. The thyroid gland is again within normal limits at about 18-20 grams in size. The consistency of the thyroid gland is normal. The thyroid gland is not tender to palpation. Lungs: The lungs are clear to auscultation. Air movement is good. Heart: Heart rate and rhythm are regular. Heart sounds S1 and S2 are normal. I did not appreciate any pathologic cardiac murmurs. Abdomen: The abdomen is somewhat enlarged. Bowel sounds are normal. There is no obvious hepatomegaly,  splenomegaly, or other mass effect. The abdomen is not tender to palpation. Arms: Muscle size and bulk are normal for age.  Hands: There is no tremor. Phalangeal and metacarpophalangeal joints are normal. Palmar muscles are low-normal. Palmar moisture is also normal. There is no palmar erythema. Palms are warm. Legs: Muscles  appear normal for age. No edema is present.  Neurologic: Strength is normal for age in both the upper and lower extremities. Muscle tone is normal. Sensation to touch is normal in the legs.   LAB DATA:   Labs 04/20/17: HbA1c 6.0%, CBG 112  Labs 04/16/17: TSH 1.12, free 4 1.5, free T3 3.2; 25-OH vitamin D pending  Labs 10/13/16: HbA1c 6.1%, CBG 122  Labs 10/07/16: TSH 2.63, free T4 1.3, free T3 2.9; 25-OH vitamin D 47  Labs 06/05/16: HbA1c 5.8%, fasting glucose 120; TSH  1.40, free T4 1.1, free T3 2.7  Labs 02/22/16: HbA1c 6.4%, CBG 104, C-peptide 1.57 (ref 080-3.85); PTH 28, calcium 10, 25-OH vitamin D 45; TSH 0.16, free T4 1.3, free T3 2.8; CMP normal  Labs 11/11/15: TSH 0.05, free T4 1.5, free T3 3.6  Labs 08/14/15: HbA1c 5.8%  Labs 04/27/15: HbA1c 6.3%; TSH 1.04, free T4 1.3, free T3 2.8; PTH 45, calcium 9.4, 25-OH vitamin D 19  Labs 04/24/15: C-peptide 1.43 (normal 0.80-3.90)  Labs 12/04/14: HbA1c 6.1%  Labs 09/26/14 at 12:01 PM: CMP normal; HbA1c 6.2%;CBC normal, iron 81; ACTH 21, cortisol 12.9; vitamin B12 705 (normal 211-911)  09/19/14: TSH 1.640, free T4 1.03, free T3 2.6; Calcium 9.3, PTH 33 (increased from 26 in March), 25-OH vitamin D 21 (decreased from 23 in march)  04/28/14: TSH 0.081, free T4 1.47, free T3 3.3; calcium 9.4, PTH 26, 25-OH vitamin D 23  10/27/13: TSH 1.087, free T4 1.04, free T3 2.7; Calcium 9.6, PTH 32, 25-hydroxyvitamin D 34  05/18/13: TSH 0.052, free T4 1.24, free T3 3.2  04/28/13: TSH 0.426, free T4 1.24, free T3 3.2; calcium 9.7, 25-hydroxy vitamin D 36  10/08/12: TSH 2.106, free T4 1.05, free T3 2.6; 25-OH vitamin D 37, PTH 39.4,  calcium 9.1  04/02/12: TSH 1.073, free T4 1.31, free T3 2.9, calcium 9.5, 25-hydroxy vitamin D 31 - PTH was ordered but not done. I have re-ordered it this morning.  09/30/11: TSH 8.095, free T4 1.16, free T3 2.4. PTH 24, calcium 9.3, 25-hydroxy vitamin D 33, 1,25-dihydroxy vitamin D 95 11/21/10: Calcium was 9.5. Her PTH was 37.6. 25-hydroxy vitamin D was 41. 1, 25-dihydroxy vitamin D was 85, which was slightly elevated. B12 was 352 (211-911)    IMAGING:   12/07/14 US thyroid: Both lobes are smaller, with maximum dimensions <2.5 cm. Both lobes are heterogenous in echotexture. The previously seen exophytic, solid, hypoechoic, ill-defined nodule in the left inferior pole remains at 0.9 x 0.4 x 0.5 cm and is unchanged from 08/23/2009.   10/12/12: US thyroid gland: Small 10 x 4 x 5 mm nodule or adjacent lymph node or parathyroid adenoma. Diffuse echogenicity. As I read the [place], there is definitely a lot of echogenicity c/w Hashimoto's thyroiditis. There may be a small nodule that is actually a bit less than one cm in longest dimension. This is unchanged since June 2011.   Assessment and Plan:   ASSESSMENT:  1. Hypothyroid:   A. She has acquired hypothyroidism due to Hashimoto's thyroiditis. During the past 11 years her TFTs have been variable, requiring changes in her Synthroid dosage. Part of the variability was due to flare ups of Hashimoto's disease causing Hashitoxicosis.   B. Her TFTs today are in the middle of the real physiologic normal range on her current Synthroid dose.  2. Thyroiditis: The patient's Hashimoto's disease has usually been clinically quiescent, but was subclinically active in the January-March 2016 time period. Her thyroid US in October 2016 showed  the heterogeneity c/w Hashimoto's thyroiditis. We'll see if she loses more thyroid cells over time. 3. Multinodular goiter:   A. Thyroid gland is again within normal limits for size today. The waxing and waning of thyroid gland size is  c/w evolving Hashimoto's disease. Although some [place] studies have shown a nodule, her [place] study in June 2011 showed either a questionable nodule or demonstrated the echotexture heterogeneity that is commonly seen with Hashimoto's disease. Her [place] studies in August 2014 and October 2016 were essentially unchanged. She did not need a FNA at those times.  B. Her thyroid gland is not enlarged today. I do not palpate any nodular area.  4. Headaches: Headaches appear to be "tension" headaches. They have significantly decreased in both frequency and severity. She does better when she performs her cervical stretching exercises daily.   5. Hot flashes and sweats: These problems recur intermittently.   6-7. Leg pains/vitamin D deficiency disease:   A. Her labs in July 2016 showed that her calcium was normal at 9.3, just below the 50%. Her PTH value was mid-range normal. Her vitamin D was lower. She needed more calcium and vitamin D. I had previously asked her to take Citracal-D at doses of 1200-1500 mg of calcium per day and [phone] IU of vitamin D per day.   B. Her labs in March 2017 showed that her PTH value of 45 was higher, but still mid-normal. Calcium was a bit higher at 9.4. Vitamin D was lower at 19. At that point she agreed to purchase and take the Biotech form of vitamin D, one 50,000 IU capsule each week.   C. Her PTH, calcium and vitamin D were normal in December 2017, although the vitamin D had decreased.   D. She is now taking Biotech vitamin D, 50,000 units each week.  Her vitamin D levels were normal again in August 2018.  Her results from last week are pending.  8. Meniere's disease: Her vertigo has essentially resolved. Her left ear Meniere's disease has not bothered her for months. Her hearing in the left ear varies.  9. Hoarseness: The fact that her hoarseness and post nasal drip come and go essentially in parallel indicate that the hoarseness is likely due to flare ups of her allergies. However,  several flare ups of Hashimoto's disease have also caused hoarseness in the past. 10. Dyspepsia: This problem is reasonably well controlled with Protonix. 11. Hypertension: Her BP had increased at her last visit, but has normalized today.    13. Depression: She looks very good today.   14. Fibromyalgia syndrome: This condition remains a problem for her, but seems to be improved.  Exercise will help her "re-charge her batteries".  15. Fatigue:  This problem has also improved. 16. Prediabetes: Her HbA1c values in July and in October 2016 were elevated into the "pre-diabetes" range. Her HbA1c values have fluctuated over time depending upon her diet and her activity levels.  Her HbA1c today was higher at her last visit after stopping exercise and gaining weight. Her HbA1c is a bit lower today. I again offered her medication and she again politely declined.  17. Arthritis: Her arthritis seems to be doing fairly well.   49. Overweight: Ms. Sida is mildly overweight. Unfortunately, after losing 6 pounds at her prior visit, she has re-gained another 4 pounds.   PLAN:  1. Diagnostic: Reviewed HbA1c result and lab results from February 2019. Ordered vitamin D and TFTs prior to next visit. HbA1c at next visit.  2. Therapeutic: Try to do daily physical activity. Watch what she eats and drinks. Continue Synthroid at current doses. Take MVI daily and Biotech, 50,000 IU per week.  3. Patient education: We again discussed the issues of prediabetes, headaches, Hashimoto's disease, hypothyroidism, fibromyalgia, fatigue, and COPD. We discussed what she needs to do to control her BGs, to include getting more exercise and following our Eat Right Diet. She knows what she needs to do to get her weight back under control.   4. Follow-up: 6 months  Level of Service: This visit lasted in excess of 55 minutes. More than 50% of the visit was devoted to counseling.   Tillman Sers, MD, CDE Adult and Pediatric  Endocrinology

## 2017-04-21 LAB — T3, FREE: T3 FREE: 3.2 pg/mL (ref 2.3–4.2)

## 2017-04-21 LAB — T4, FREE: Free T4: 1.5 ng/dL (ref 0.8–1.8)

## 2017-04-21 LAB — VITAMIN D 25 HYDROXY (VIT D DEFICIENCY, FRACTURES): Vit D, 25-Hydroxy: 55 ng/mL (ref 30–100)

## 2017-04-21 LAB — TSH: TSH: 1.12 m[IU]/L (ref 0.40–4.50)

## 2017-06-19 ENCOUNTER — Telehealth: Payer: Self-pay | Admitting: Internal Medicine

## 2017-06-19 MED ORDER — PREDNISONE 10 MG (21) PO TBPK: ORAL_TABLET | Freq: Every day | ORAL | 0 refills | Status: DC

## 2017-06-19 NOTE — Telephone Encounter (Signed)
Can try Please take prednisone 40 mg x1 day, then 30 mg x1 day, then 20 mg x1 day, then 10 mg x1 day, and then 5 mg x1 day and stop  Or   Take prednisone 40 mg daily x 2 days, then 20mg  daily x 2 days, then 10mg  daily x 2 days, then 5mg  daily x 2 days and stop   Whatever 5d v 8d her preverence is

## 2017-06-19 NOTE — Telephone Encounter (Signed)
Called and spoke to patient. Gave her the options that MR was giving for Rx of 5 or 8 day taper. Patient decided to go with the shorter 5 day taper. Rx sent to pharmacy of patient's choice. Nothing further is needed at this time.

## 2017-06-19 NOTE — Telephone Encounter (Signed)
Spoke with pt, she states she wants to see if she can calm down her cough. She states the pollen is aggravating her and making it worse. She takes Incruse in the morning and Breo at night. She did take Robitussin but in the mornings the cough is really bad. She has tried using albuterol and it helps but makes her really anxious so she tries to avoid taking it. MR please advise.   Current Outpatient Medications on File Prior to Visit  Medication Sig Dispense Refill  . albuterol (PROVENTIL HFA;VENTOLIN HFA) 108 (90 Base) MCG/ACT inhaler Inhale 2 puffs into the lungs every 6 (six) hours as needed for wheezing or shortness of breath. 1 Inhaler 6  . Cyanocobalamin (VITAMIN B 12 PO) Take by mouth daily.    . DULoxetine (CYMBALTA) 60 MG capsule Take 60 mg by mouth daily.    . enalapril (VASOTEC) 2.5 MG tablet Take 2.5 mg by mouth daily.     . fluticasone furoate-vilanterol (BREO ELLIPTA) 100-25 MCG/INH AEPB Inhale 1 puff into the lungs daily. 1 each 6  . levothyroxine (SYNTHROID) 88 MCG tablet Take 1 tablet (88 mcg total) by mouth daily before breakfast. 30 tablet 5  . losartan-hydrochlorothiazide (HYZAAR) 100-25 MG tablet Take 1 tablet by mouth daily.    . Multiple Vitamin (MULTIVITAMIN) tablet Take 1 tablet by mouth daily.      Marland Kitchen omeprazole (PRILOSEC) 20 MG capsule Take 20 mg by mouth daily.    . rosuvastatin (CRESTOR) 5 MG tablet Take 5 mg by mouth daily.    Marland Kitchen SYNTHROID 88 MCG tablet TAKE 1 TABLET (88 MCG TOTAL) BY MOUTH DAILY BEFORE BREAKFAST. 30 tablet 5  . umeclidinium bromide (INCRUSE ELLIPTA) 62.5 MCG/INH AEPB Inhale 1 puff into the lungs daily.    Marland Kitchen VITAMIN D, CHOLECALCIFEROL, PO Take 50,000 Units by mouth once a week.      No current facility-administered medications on file prior to visit.    Allergies  Allergen Reactions  . Acyclovir And Related   . Amoxicillin Hives  . Codeine Nausea And Vomiting  . Lorabid [Loracarbef] Hives  . Sulfa Antibiotics Hives

## 2017-08-26 LAB — PULMONARY FUNCTION TEST
DL/VA % pred: 84 %
DL/VA: 4.06 ml/min/mmHg/L
DLCO unc % pred: 52 %
DLCO unc: 12.81 ml/min/mmHg
FEF 25-75 POST: 0.55 L/s
FEF 25-75 PRE: 0.31 L/s
FEF2575-%Change-Post: 74 %
FEF2575-%PRED-POST: 35 %
FEF2575-%PRED-PRE: 20 %
FEV1-%Change-Post: 26 %
FEV1-%Pred-Post: 46 %
FEV1-%Pred-Pre: 36 %
FEV1-POST: 0.96 L
FEV1-PRE: 0.76 L
FEV1FVC-%Change-Post: 5 %
FEV1FVC-%PRED-PRE: 60 %
FEV6-%CHANGE-POST: 21 %
FEV6-%Pred-Post: 75 %
FEV6-%Pred-Pre: 61 %
FEV6-Post: 1.96 L
FEV6-Pre: 1.61 L
FEV6FVC-%Change-Post: 1 %
FEV6FVC-%Pred-Post: 102 %
FEV6FVC-%Pred-Pre: 100 %
FVC-%CHANGE-POST: 19 %
FVC-%Pred-Post: 73 %
FVC-%Pred-Pre: 61 %
FVC-Post: 2.02 L
FVC-Pre: 1.69 L
POST FEV1/FVC RATIO: 48 %
PRE FEV1/FVC RATIO: 45 %
Post FEV6/FVC ratio: 97 %
Pre FEV6/FVC Ratio: 95 %

## 2017-10-05 LAB — PTH, INTACT AND CALCIUM
Calcium: 9.6 mg/dL (ref 8.6–10.4)
PTH: 36 pg/mL (ref 14–64)

## 2017-10-05 LAB — T3, FREE: T3 FREE: 2.9 pg/mL (ref 2.3–4.2)

## 2017-10-05 LAB — VITAMIN D 25 HYDROXY (VIT D DEFICIENCY, FRACTURES): VIT D 25 HYDROXY: 53 ng/mL (ref 30–100)

## 2017-10-05 LAB — TSH: TSH: 0.66 mIU/L (ref 0.40–4.50)

## 2017-10-05 LAB — T4, FREE: Free T4: 1.3 ng/dL (ref 0.8–1.8)

## 2017-10-07 ENCOUNTER — Encounter (INDEPENDENT_AMBULATORY_CARE_PROVIDER_SITE_OTHER): Payer: Self-pay | Admitting: "Endocrinology

## 2017-10-07 ENCOUNTER — Ambulatory Visit (INDEPENDENT_AMBULATORY_CARE_PROVIDER_SITE_OTHER): Payer: Medicare Other | Admitting: "Endocrinology

## 2017-10-07 VITALS — BP 124/80 | HR 78 | Ht 64.17 in | Wt 155.8 lb

## 2017-10-07 DIAGNOSIS — E049 Nontoxic goiter, unspecified: Secondary | ICD-10-CM

## 2017-10-07 DIAGNOSIS — M797 Fibromyalgia: Secondary | ICD-10-CM

## 2017-10-07 DIAGNOSIS — I10 Essential (primary) hypertension: Secondary | ICD-10-CM

## 2017-10-07 DIAGNOSIS — R5383 Other fatigue: Secondary | ICD-10-CM | POA: Diagnosis not present

## 2017-10-07 DIAGNOSIS — R7303 Prediabetes: Secondary | ICD-10-CM

## 2017-10-07 DIAGNOSIS — E063 Autoimmune thyroiditis: Secondary | ICD-10-CM

## 2017-10-07 DIAGNOSIS — E559 Vitamin D deficiency, unspecified: Secondary | ICD-10-CM

## 2017-10-07 DIAGNOSIS — R1013 Epigastric pain: Secondary | ICD-10-CM

## 2017-10-07 LAB — POCT GLYCOSYLATED HEMOGLOBIN (HGB A1C): Hemoglobin A1C: 6.2 % — AB (ref 4.0–5.6)

## 2017-10-07 LAB — POCT GLUCOSE (DEVICE FOR HOME USE): POC Glucose: 135 mg/dl — AB (ref 70–99)

## 2017-10-07 NOTE — Patient Instructions (Signed)
Follow up visit in 6 months. Please repeat thyroid tests in 3 months. Please repeat thyroid tests, vitamin D,  calcium, and PTH about two weeks prior to next visit.

## 2017-10-07 NOTE — Progress Notes (Signed)
Subjective:  Patient Name: Shelby Lin Date of Birth: Jul 18, 1940  MRN: [id number]  Shelby Lin  presents to the office today for follow-up of her hypothyroidism secondary to Hashimoto's disease, questionable nodular goiter, tachycardia, tremor, dyspepsia, fatigue, fibromyalgia, hyperlipidemia, night sweats, tremor, and pre-diabetes.  HISTORY OF PRESENT ILLNESS:   Shelby Lin is a 77 y.o. Caucasian woman.  Shelby Lin was unaccompanied.  1. Shelby Lin was referred to me on 03/25/2005 by her primary care provider, Dr. Carol Ada, for evaluation of her thyroid problems. Shelby Lin was 10 at that time.   A. At about age 7 the patient developed thyroid problems and was put on a thyroid medicine. Later when she became pregnant her obstetrician took her off that medicine. At about age 21, she developed severe fatigue and weight gain. She was put back on Synthroid. Her dose of Synthroid was 88 mcg per day for many years. In January 2006, she had fluctuations in her thyroid function tests. Synthroid was increased to 100 mcg and later to 125 mcg per day. The Synthroid dose was then reduced to 75 mcg. After blood tests about 9 months prior to her first appointment with me, the patient was put back on a dose of 88 mcg per day. When the patient was having fluctuations in her thyroid tests, her thyroid gland became visibly enlarged and felt full and tender to palpation. She was also more hoarse during those periods. Hoarseness came and went in parallel with the thyroid gland swelling. Ultrasound studies of the thyroid in January 2006 and January 2007 showed a multinodular goiter.   B. Thyroid function tests performed at that first visit showed a TSH of 4.139, free T4 1.22, and free T3 of 2.7. Because any TSH at that time greater than 3.0 was considered to be elevated physiologically, I increased her Synthroid from 88 to 100 mcg per day. TPO antibody was markedly positive at 954.0, c/w the diagnosis of Hashimoto's  thyroiditis. Her TSI level was quite normal at 0.9.   2. During the last 12 years the patient has had several medical issues that we have followed:  A. She has had several flare-ups of Hashimoto's disease, resulting in several changes in her doses of Synthroid. Her Synthroid dose was subsequently decreased to 88 mcg/day in January 2018.   B. Although she was initially thought to have a multinodular goiter, over time it became clear that all but one of the nodules were actually areas of echotexture heterogeneity. She has had one "nodularish" area of her left inferior pole that has remained unchanged since 2011. This area is ill-defined and may also just represent several overlapping areas of heterogeneity.  C. She has had problems with fibromyalgia, arthritis, hypertension, depression, fatigue, tremor, hyperlipidemia, reflux, dyspepsia, and vitamin D deficiency.   D. In August 2016 her HbA1c was 6.2%, c/w prediabetes. Follow up HbA1c in October 2016 was 6.1%. The patient refused to take metformin because three of her relatives lost their scalp hair when they took metformin.    3. The patient's last [facility] visit was on 04/20/17. At that visit I continued her Synthroid, MVI, and Biotech. In the interim she has been healthy, but her COPD is much worse and she has been very tired. Her allergies have been "okay".     A. Her tension headaches occur  more frequently and are often pretty bad. Cervical stretching helps when she does her cervical stretching exercises regularly.   B. She has much more fatigue. Her energy has been  low. She says that she sleeps well. Her husband has not been complaining about her snoring and she has not awakened gasping for breath.     C. She still has hot spells or sweats occasionally.   D. Her Spiriva was discontinued. She takes Incruse in the mornings and Breo at night. She saw [name] at [facility], who wanted her to stop taking enalapril. Her LMD put her on losartan.  She thinks that her SOB has been worse since starting losartan. I suggested that she ask [name] which antihypertensive he thinks might be better for her lungs.   E. Her depression is "about the same, some days better than others".  Her family problems are unchanged.      F. She continues to have problems with fibromyalgia and arthritis in her wrists, hands, knees, and feet, worse when it rains.   G. Her feet still bother her a lot. Her feet get red and feel hot in the forefoot area a lot, especially when the weather is warm. .    H. Her Meniere's disease is "about the same", but no too bad. She does not have ringing and roaring in her ears very often. .   I. She remains on her Protonix daily. She still takes Synthroid, 88 mcg/day. She also takes Crestor 5 mg/day, an MVI, and Biotech vitamin D, 50,000 IU/day.   J. She is intermittently hoarse. The hoarseness still comes and goes in association with her allergies.   K. She only has sweet tea at times if she eat out.    4. Pertinent Review of Systems:  Constitutional: "I feel tired."  Her stamina and energy are worse. The SOB is worse if she is outside in the heat or if she is ina hurry and gets anxious.  Eyes: Vision is good as long as she wears her glasses. She had an eye exam in March 2018. There were no significant eye problems noted.  She has a follow up exam this afternoon.  Neck: The thyroid gland has not seemed to swell lately. The patient has no complaints of anterior neck soreness, tenderness,  pressure, discomfort, or difficulty swallowing.  Heart: She has not had any other chest symptoms. Heart rate increases with exercise or other physical activity. The patient has no other complaints of palpitations, irregular heat beats, chest pain, or chest pressure. Gastrointestinal: Protonix is usually enough to control her acid reflux, indigestion, and dyspepsia. Bowel movements are normal.  Legs: Muscle mass and strength seem normal. Legs are  stronger when she walks more. She no longer has episodic leg numbness and burning. She has had some edema occasionally.   Feet: Her feet still burn in the forefeet at times.    GYN: She still gets hot flashes, but the intensity of the hot flashes varies over time.   Emotional/psychiatric: She feels "okay".    Mental: She does "fine" at thinking, paying attention, remembering, and making decisions.   PAST MEDICAL, FAMILY, AND SOCIAL HISTORY:  Past Medical History:  Diagnosis Date  . Arthritis   . Combined hyperlipidemia   . COPD (chronic obstructive pulmonary disease) ([place])   . Coronary artery spasm ([place])   . Dupuytren's contracture   . Dyspepsia   . Familial tremor   . Fatigue   . Fibromyalgia syndrome   . GERD (gastroesophageal reflux disease)   . Hypertension   . Hypothyroidism, acquired, autoimmune   . Multinodular goiter (nontoxic)   . Tachycardia   . Thyroiditis, autoimmune   .  Wears glasses     Family History  Problem Relation Age of Onset  . Cancer Mother   . Hypertension Mother   . Stroke Mother   . Heart attack Father   . Sudden death Father   . Hypertension Sister   . Diabetes Sister   . Stroke Sister   . Thyroid disease Neg Hx      Current Outpatient Medications:  .  albuterol (PROVENTIL HFA;VENTOLIN HFA) 108 (90 Base) MCG/ACT inhaler, Inhale 2 puffs into the lungs every 6 (six) hours as needed for wheezing or shortness of breath., Disp: 1 Inhaler, Rfl: 6 .  Cyanocobalamin (VITAMIN B 12 PO), Take by mouth daily., Disp: , Rfl:  .  DULoxetine (CYMBALTA) 60 MG capsule, Take 60 mg by mouth daily., Disp: , Rfl:  .  fluticasone furoate-vilanterol (BREO ELLIPTA) 100-25 MCG/INH AEPB, Inhale 1 puff into the lungs daily., Disp: 1 each, Rfl: 6 .  levothyroxine (SYNTHROID) 88 MCG tablet, Take 1 tablet (88 mcg total) by mouth daily before breakfast., Disp: 30 tablet, Rfl: 5 .  losartan-hydrochlorothiazide (HYZAAR) 100-25 MG tablet, Take 1 tablet by mouth daily., Disp: ,  Rfl:  .  Multiple Vitamin (MULTIVITAMIN) tablet, Take 1 tablet by mouth daily.  , Disp: , Rfl:  .  omeprazole (PRILOSEC) 20 MG capsule, Take 20 mg by mouth daily., Disp: , Rfl:  .  rosuvastatin (CRESTOR) 5 MG tablet, Take 5 mg by mouth daily., Disp: , Rfl:  .  umeclidinium bromide (INCRUSE ELLIPTA) 62.5 MCG/INH AEPB, Inhale 1 puff into the lungs daily., Disp: , Rfl:  .  VITAMIN D, CHOLECALCIFEROL, PO, Take 50,000 Units by mouth once a week. , Disp: , Rfl:  .  enalapril (VASOTEC) 2.5 MG tablet, Take 2.5 mg by mouth daily. , Disp: , Rfl:  .  predniSONE (STERAPRED UNI-PAK 21 TAB) 10 MG (21) TBPK tablet, Take by mouth daily. Take 40mg  X 1 day, 30mg  X1, 20mg  X1, 10mg  X1, 5mg  X1, then stop (Patient not taking: Reported on 10/07/2017), Disp: 11 tablet, Rfl: 0  Allergies as of 10/07/2017 - Review Complete 10/07/2017  Allergen Reaction Noted  . Acyclovir and related  04/01/2017  . Amoxicillin Hives 06/18/2010  . Codeine Nausea And Vomiting 02/21/2013  . Lorabid [loracarbef] Hives 06/18/2010  . Sulfa antibiotics Hives 06/18/2010    1. Work and Family: She retired in June 2013, but later went back to work part-time. She still works on Tuesdays and Thursdays. She still stays busy. Her husband, who is 43, still works 4 days per week.   2. Activities: She has not been walking much in the cold weather.   3. Smoking, alcohol, or drugs: None 4. Primary Care Provider: Dr. Carol Ada, [facility] [place]: There are no other significant problems involving Shelby Lin's other body systems.   Objective:  Vital Signs:  BP 124/80   Pulse 78   Ht 5' 4.17" (1.63 m)   Wt 155 lb 12.8 oz (70.7 kg)   BMI 26.60 kg/m     PHYSICAL EXAMINATION  Constitutional: Shelby Lin looks fairly good today. She is very alert, bright, and has a good sense of humor. Her affect is normal, but she is not as upbeat as she was at her last visit. Her insight is always good. Her weight has increased 1 pound.   Eyes: There is no obvious arcus or proptosis. Moisture appears normal. Mouth: The oropharynx and tongue appear normal. Oral moisture is normal. There is no oral hyperpigmentation.  Neck: The neck appears to be visibly normal. No carotid bruits are noted. The thyroid gland is again within normal limits at about 18-20 grams in size. The consistency of the thyroid gland is normal. The thyroid gland is not tender to palpation. Lungs: The lungs are clear to auscultation. Air movement is good, but her expiratory rate is slow.  Heart: Heart rate and rhythm are regular. Heart sounds S1 and S2 are normal. I did not appreciate any pathologic cardiac murmurs. Abdomen: The abdomen is somewhat enlarged. Bowel sounds are normal. There is no obvious hepatomegaly, splenomegaly, or other mass effect. The abdomen is not tender to palpation. Arms: Muscle size and bulk are normal for age.  Hands: There is no tremor. Phalangeal and metacarpophalangeal joints are normal. Palmar muscles are low-normal. Palmar moisture is also normal. There is no palmar erythema. Palms are warm. Legs: Muscles appear normal for age. No edema is present. Shins are sore to the touch.  Neurologic: Strength is normal for age in both the upper and lower extremities. Muscle tone is normal. Sensation to touch is normal in the legs.   LAB DATA:   Labs 10/07/17: HbA1c 6.2%, CBG 135  Labs 10/02/17: TSH 0.66, free T4 1.3, free T3.9; PTH 36, calcium 9.6, 25-OH vitamin D 53  Labs 04/20/17: HbA1c 6.0%, CBG 112  Labs 04/16/17: TSH 1.12, free 4 1.5, free T3 3.2; 25-OH vitamin D 55  Labs 10/13/16: HbA1c 6.1%, CBG 122  Labs 10/07/16: TSH 2.63, free T4 1.3, free T3 2.9; 25-OH vitamin D 47  Labs 06/05/16: HbA1c 5.8%, fasting glucose 120; TSH  1.40, free T4 1.1, free T3 2.7  Labs 02/22/16: HbA1c 6.4%, CBG 104, C-peptide 1.57 (ref 080-3.85); PTH 28, calcium 10, 25-OH vitamin D 45; TSH 0.16, free T4 1.3, free T3 2.8; CMP normal  Labs 11/11/15: TSH 0.05, free  T4 1.5, free T3 3.6  Labs 08/14/15: HbA1c 5.8%  Labs 04/27/15: HbA1c 6.3%; TSH 1.04, free T4 1.3, free T3 2.8; PTH 45, calcium 9.4, 25-OH vitamin D 19  Labs 04/24/15: C-peptide 1.43 (normal 0.80-3.90)  Labs 12/04/14: HbA1c 6.1%  Labs 09/26/14 at 12:01 PM: CMP normal; HbA1c 6.2%;CBC normal, iron 81; ACTH 21, cortisol 12.9; vitamin B12 705 (normal 211-911)  09/19/14: TSH 1.640, free T4 1.03, free T3 2.6; Calcium 9.3, PTH 33 (increased from 26 in March), 25-OH vitamin D 21 (decreased from 23 in march)  04/28/14: TSH 0.081, free T4 1.47, free T3 3.3; calcium 9.4, PTH 26, 25-OH vitamin D 23  10/27/13: TSH 1.087, free T4 1.04, free T3 2.7; Calcium 9.6, PTH 32, 25-hydroxyvitamin D 34  05/18/13: TSH 0.052, free T4 1.24, free T3 3.2  04/28/13: TSH 0.426, free T4 1.24, free T3 3.2; calcium 9.7, 25-hydroxy vitamin D 36  10/08/12: TSH 2.106, free T4 1.05, free T3 2.6; 25-OH vitamin D 37, PTH 39.4, calcium 9.1  04/02/12: TSH 1.073, free T4 1.31, free T3 2.9, calcium 9.5, 25-hydroxy vitamin D 31 - PTH was ordered but not done. I have re-ordered it this morning.  09/30/11: TSH 8.095, free T4 1.16, free T3 2.4. PTH 24, calcium 9.3, 25-hydroxy vitamin D 33, 1,25-dihydroxy vitamin D 95 11/21/10: Calcium was 9.5. Her PTH was 37.6. 25-hydroxy vitamin D was 41. 1, 25-dihydroxy vitamin D was 85, which was slightly elevated. B12 was 352 (211-911)    IMAGING:   12/07/14 US thyroid: Both lobes are smaller, with maximum dimensions <2.5 cm. Both lobes are heterogenous in echotexture. The previously seen exophytic, solid, hypoechoic, ill-defined nodule in  the left inferior pole remains at 0.9 x 0.4 x 0.5 cm and is unchanged from 08/23/2009.   10/12/12: US thyroid gland: Small 10 x 4 x 5 mm nodule or adjacent lymph node or parathyroid adenoma. Diffuse echogenicity. As I read the [place], there is definitely a lot of echogenicity c/w Hashimoto's thyroiditis. There may be a small nodule that is actually a bit less than one cm in  longest dimension. This is unchanged since June 2011.   Assessment and Plan:   ASSESSMENT:  1. Hypothyroid:   A. She has acquired hypothyroidism due to Hashimoto's thyroiditis. During the past 11 years her TFTs have been variable, requiring changes in her Synthroid dosage. Part of the variability was due to flare ups of Hashimoto's disease causing Hashitoxicosis.   B. Her TFTs in February 2019 were in the middle of the real physiologic normal range on her current Synthroid dose. Her TFTs in August 2019 were more variable. Her TSH was lower, her free T4 was lower, and her free T3 was higher. This pattern is most c/w having had a recent  flare up of thyroiditis. 2. Thyroiditis: The patient's Hashimoto's disease has usually been clinically quiescent, but was mildly Hashitoxic in the January-March 2016 time period. Her thyroid US in October 2016 showed the heterogeneity c/w Hashimoto's thyroiditis. As noted above, she appears to have had a recent flare up of thyroiditis. We'll see if she loses more thyroid cells over time. 3. Multinodular goiter:   A. Thyroid gland was again within normal limits for size at her last visit. The waxing and waning of thyroid gland size is c/w evolving Hashimoto's disease. Although some [place] studies have shown a nodule, her [place] study in June 2011 showed either a questionable nodule or demonstrated the echotexture heterogeneity that is commonly seen with Hashimoto's disease. Her [place] studies in August 2014 and October 2016 were essentially unchanged. She did not need a FNA at those times.  B. Her thyroid gland is not enlarged today. I do not palpate any nodular area.  4. Headaches: Headaches appear to be "tension" headaches. They have significantly increased in both frequency and severity. She does better when she performs her cervical stretching exercises daily.   5. Hot flashes and sweats: These problems recur intermittently.   6-7. Leg pains/vitamin D deficiency disease:   A.  Her labs in July 2016 showed that her calcium was normal at 9.3, just below the 50%. Her PTH value was mid-range normal. Her vitamin D was lower. She needed more calcium and vitamin D. I had previously asked her to take Citracal-D at doses of 1200-1500 mg of calcium per day and [phone] IU of vitamin D per day.   B. Her labs in March 2017 showed that her PTH value of 45 was higher, but still mid-normal. Calcium was a bit higher at 9.4. Vitamin D was lower at 19. At that point she agreed to purchase and take the Biotech form of vitamin D, one 50,000 IU capsule each week.   C. Her PTH, calcium and vitamin D were normal in December 2017, although the vitamin D had decreased.   D. She is now taking Biotech vitamin D, 50,000 units each week.  Her vitamin D levels were normal again in August 2018 and in August 2019.   8. Meniere's disease: Her vertigo has essentially resolved. Her Meniere's disease has bothered her a bit more in the past few months. Her hearing in the left ear varies.  9. Hoarseness: The fact  that her hoarseness and post nasal drip come and go essentially in parallel indicate that the hoarseness is likely due to flare ups of her allergies. However, several flare ups of Hashimoto's disease have also caused hoarseness in the past. 10. Dyspepsia: This problem is reasonably well controlled with Protonix. 11. Hypertension: Her BP had normalized at her last visit, but her DBP is higher today. She will discuss BP medication with her pulmonologist.    13. Depression: She looks fairly good today, but not as upbeat.   14. Fibromyalgia syndrome: This condition remains a problem for her, but seems to have worsened.  Exercise will help her "re-charge her mitochondrial batteries".  15. Fatigue:  This problem has also worsened.. 16. Prediabetes: Her HbA1c values in July and in October 2016 were elevated into the "pre-diabetes" range. Her HbA1c values have fluctuated over time depending upon her diet and her  activity levels.  Her HbA1c today was higher than at her last visit after reducing exercise and gaining weight. If she does not exercise much, she needs to cut back more on carbs. I again offered her medication and she again politely declined.  17. Arthritis: Her arthritis seems to be doing fairly well.   4. Overweight: Ms. Abadi is mildly overweight. She has gained one pound since her last visit.   PLAN:  1. Diagnostic: Reviewed HbA1c result and lab results from August 2019. Ordered TFTs in 3 months and TFTs, calcium, PTH, and vitamin D prior to next visit. HbA1c at next visit. 2. Therapeutic: Try to do daily physical activity. Watch what she eats and drinks. Continue Synthroid at current doses. Take MVI daily and Biotech, 50,000 IU per week.  3. Patient education: We again discussed the issues of prediabetes, headaches, Hashimoto's disease, hypothyroidism, fibromyalgia, fatigue, and COPD. We discussed what she needs to do to control her BGs, to include getting more exercise and following our Eat Right Diet. She knows what she needs to do to get her weight back under control.   4. Follow-up: 6 months  Level of Service: This visit lasted in excess of 55 minutes. More than 50% of the visit was devoted to counseling.   Tillman Sers, MD, CDE Adult and Pediatric Endocrinology

## 2017-10-16 ENCOUNTER — Encounter: Payer: Self-pay | Admitting: Internal Medicine

## 2017-10-16 ENCOUNTER — Ambulatory Visit: Payer: Medicare Other | Admitting: Internal Medicine

## 2017-10-16 VITALS — BP 128/70 | HR 90 | Ht 64.0 in | Wt 157.2 lb

## 2017-10-16 DIAGNOSIS — Z23 Encounter for immunization: Secondary | ICD-10-CM | POA: Diagnosis not present

## 2017-10-16 DIAGNOSIS — J449 Chronic obstructive pulmonary disease, unspecified: Secondary | ICD-10-CM | POA: Diagnosis not present

## 2017-10-16 DIAGNOSIS — R911 Solitary pulmonary nodule: Secondary | ICD-10-CM | POA: Diagnosis not present

## 2017-10-16 MED ORDER — FLUTICASONE FUROATE-VILANTEROL 100-25 MCG/INH IN AEPB: 1.0000 | INHALATION_SPRAY | Freq: Every day | RESPIRATORY_TRACT | 0 refills | Status: DC

## 2017-10-16 NOTE — Patient Instructions (Addendum)
Stage 3 severe COPD by GOLD classification ([place]) - stable disease  - continue incruse and breo scheduled; take 1 month worth of sample  - consider 90 day supply to lower cost - use albuterol as needed - flu shot in fall - prevnar vaccine 10/16/2017 (noticed you have never had this) - Please talk to PCP Carol Ada, MD -  and ensure you get  shingarix vaccine    Nodule of middle lobe of right lung - do 1 year CT in dec 2019 - will call with result  Followup wil call with CT results in dec 2019 Otherwise 4-6 months followup

## 2017-10-16 NOTE — Addendum Note (Signed)
Addended by: Lorretta Harp on: 10/16/2017 10:58 AM   Modules accepted: Orders

## 2017-10-16 NOTE — Progress Notes (Signed)
Subjective:     [name], female   DOB: 1940-12-10, 77 y.o.   MRN: [id number]  HPI  IOV 02/11/2017  Chief Complaint  Patient presents with  . Advice Only    Self referral for COPD. States she was dx by Dr. Carol Ada x2 years ago. Has SOB with exertion and anxiety and has an occ. cough. Denies any CP.   77 year old female with limited smoking history and quit many years ago.  She tells me for the last few years she has had insidious onset of shortness of breath that is mild to moderate in severity brought on with exertion relieved by rest.  It is stable overall.  Does not much of associated cough except except very mild.  She does have associated ACE inhibitor intake.  The dyspnea is stable.  A few years ago based on pulmonary function test done?  At [facility] health location but I do not have the results patient was started on Spiriva.  There is a chest x-ray in the system from few to several years ago that shows hyperinflation personally visualized and confirmed the findings.  Few to several months ago was switched to incruse/.  She honestly does not know if the symptoms are helping her.  She denies any wheezing orthopnea proximal nocturnal dyspnea hemoptysis fever chills edema.  Some 6 weeks ago she describes what sounds like an asthma or COPD exacerbation treated with antibiotics and prednisone x2 and then back to baseline currently.  At this point in time she wants pulmonary support for her diagnosis of COPD.  Her current symptoms are detailed below.  Blood work August 2018 normal hemoglobin A1c and TSH  OV 04/01/2017  Chief Complaint  Patient presents with  . Follow-up    PFT done today and HRCT done 02/11/17.  Pt states she has been doing good.  No real complaints of cough other than acid reflux.   Follow-up investigation for COPD.  Since her last visit she had CT scan of the chest that does show bronchiectasis associated with emphysema.  Pulmonary function test today  shows Gold stage II COPD but with significant bronchodilator response.  She feels good and stable.  Cough is very minimal.  Med review shows that she is just on anticholinergic.  She is not on inhaled corticosteroid or bronchodilator.  I noticed that she is on ACE inhibitor.  She tells me that she can cough significantly after a viral infection.  However at baseline she only has mild cough.  She room was having asthma as a child but she says she outgrew it.  Incidental finding of 5 mm right middle lobe nodule in December 2018 CT chest  New issue of her having to undergo upper endoscopy by Dr. Oletta Lamas at [place].  She has had colonoscopy a few years ago under propofol and she tolerated this fine.  She says gastroenterology is not aware of her COPD diagnosis and the severity of impairment of the lung function although she is quite functional and has tolerated sedation before.   OV 10/16/2017  Chief Complaint  Patient presents with  . Follow-up    Pt states she has been having SOB due to the heat. Denies any cough or CP.    Follow-up Gold stage III COPD: Overall stable since last visit. In fact COPD cat score is improved significantly. It is only for an very minimal symptoms. This is on trouble inhaler therapy. She checked her inhaler technique with me and  it looked good.She does she had a bad July because of the heat and humidity but she stated endorse. She is grieving because she lost her little chihuahua at age 75 - [name] was his name   CAT COPD Symptom & Quality of Life Score ([place] trademark) 0 is no burden. 5 is highest burden 02/11/2017  10/16/2017   Never Cough -> Cough all the time 1 0  No phlegm in chest -> Chest is full of phlegm 1 0  No chest tightness -> Chest feels very tight 3 0  No dyspnea for 1 flight stairs/hill -> Very dyspneic for 1 flight of stairs 4 1  No limitations for ADL at home -> Very limited with ADL at home 1 0  Confident leaving home -> Not at all confident leaving  home 0 0  Sleep soundly -> Do not sleep soundly because of lung condition 3 0  Lots of Energy -> No energy at all 3 3  TOTAL Score (max 40)  16 4    Alpha 1 - 129 and MM 02/18/18   Walking desaturation test on 02/11/2017 185 feet x 3 laps on ROOM AIR:  did not desaturate. Rest pulse ox was 96%, final pulse ox was 95%. HR response 99/min at rest to 105/min at peak exertion. Patient ALETHEIA Lin  Did not Desaturate < 88% . Cleophas Dunker Kassem did not  Desaturated </= 3% points. Shelby Lin yes did get tachyardic   IMPRESSION: ct chest 2018 1. Minimal scattered cylindrical bronchiectasis with associated minimal tree-in-bud opacity in the medial upper lobes and medial right middle lobe. Findings could be due to atypical mycobacterial infection (MAI) . 2. Mild-to-moderate centrilobular emphysema with mild diffuse bronchial wall thickening, compatible with the provided history of COPD. 3. Two small solid pulmonary nodules in the right lung, largest with average diameter 5 mm in the right middle lobe. No follow-up needed if patient is low-risk (and has no known or suspected primary neoplasm). Non-contrast chest CT can be considered in 12 months if patient is high-risk. This recommendation follows the consensus statement: Guidelines for Management of Incidental Pulmonary Nodules Detected on CT Images: From the Fleischner Society 2017; Radiology 2017; 284:228-243. 4. One vessel coronary atherosclerosis.  Aortic Atherosclerosis (ICD10-I70.0) and Emphysema (ICD10-J43.9).   Electronically Signed   By: Ilona Sorrel M.D.   On: 02/11/2017 16:30     has a past medical history of Arthritis, Combined hyperlipidemia, COPD (chronic obstructive pulmonary disease) ([place]), Coronary artery spasm ([place]), Dupuytren's contracture, Dyspepsia, Familial tremor, Fatigue, Fibromyalgia syndrome, GERD (gastroesophageal reflux disease), Hypertension, Hypothyroidism, acquired, autoimmune, Multinodular goiter  (nontoxic), Tachycardia, Thyroiditis, autoimmune, and Wears glasses.   reports that she quit smoking about 16 years ago. Her smoking use included cigarettes. She has a 10.50 pack-year smoking history. She has never used smokeless tobacco.  Past Surgical History:  Procedure Laterality Date  . BREAST EXCISIONAL BIOPSY Left    benign  . BREAST EXCISIONAL BIOPSY Right    milk gland removed  . BREAST LUMPECTOMY  1990   left  . BREAST LUMPECTOMY WITH NEEDLE LOCALIZATION Right 02/28/2013   Procedure: RIGHT BREAST NEEDLE LOCALIZATION LUMPECTOMY ;  Surgeon: Edward Jolly, MD;  Location: [facility];  Service: General;  Laterality: Right;  . CHOLECYSTECTOMY  2008   lapcholi  . DILATION AND CURETTAGE OF UTERUS    . LEFT HEART CATHETERIZATION WITH CORONARY ANGIOGRAM N/A 01/26/2013   Procedure: LEFT HEART CATHETERIZATION WITH CORONARY ANGIOGRAM;  Surgeon: Candee Furbish, MD;  Location: [facility];  Service: Cardiovascular;  Laterality: N/A;  . PARTIAL HYSTERECTOMY  1971  . TONSILLECTOMY      Allergies  Allergen Reactions  . Acyclovir And Related   . Amoxicillin Hives  . Codeine Nausea And Vomiting  . Lorabid [Loracarbef] Hives  . Sulfa Antibiotics Hives    Immunization History  Administered Date(s) Administered  . Influenza, High Dose Seasonal PF 12/12/2016    Family History  Problem Relation Age of Onset  . Cancer Mother   . Hypertension Mother   . Stroke Mother   . Heart attack Father   . Sudden death Father   . Hypertension Sister   . Diabetes Sister   . Stroke Sister   . Thyroid disease Neg Hx      Current Outpatient Medications:  .  albuterol (PROVENTIL HFA;VENTOLIN HFA) 108 (90 Base) MCG/ACT inhaler, Inhale 2 puffs into the lungs every 6 (six) hours as needed for wheezing or shortness of breath., Disp: 1 Inhaler, Rfl: 6 .  Cyanocobalamin (VITAMIN B 12 PO), Take by mouth daily., Disp: , Rfl:  .  DULoxetine (CYMBALTA) 60 MG capsule, Take 60 mg by mouth  daily., Disp: , Rfl:  .  fluticasone furoate-vilanterol (BREO ELLIPTA) 100-25 MCG/INH AEPB, Inhale 1 puff into the lungs daily., Disp: 1 each, Rfl: 6 .  levothyroxine (SYNTHROID) 88 MCG tablet, Take 1 tablet (88 mcg total) by mouth daily before breakfast., Disp: 30 tablet, Rfl: 5 .  losartan-hydrochlorothiazide (HYZAAR) 100-25 MG tablet, Take 1 tablet by mouth daily., Disp: , Rfl:  .  Multiple Vitamin (MULTIVITAMIN) tablet, Take 1 tablet by mouth daily.  , Disp: , Rfl:  .  omeprazole (PRILOSEC) 20 MG capsule, Take 20 mg by mouth daily., Disp: , Rfl:  .  rosuvastatin (CRESTOR) 5 MG tablet, Take 5 mg by mouth daily., Disp: , Rfl:  .  umeclidinium bromide (INCRUSE ELLIPTA) 62.5 MCG/INH AEPB, Inhale 1 puff into the lungs daily., Disp: , Rfl:  .  VITAMIN D, CHOLECALCIFEROL, PO, Take 50,000 Units by mouth once a week. , Disp: , Rfl:    Review of Systems     Objective:   Physical Exam  Constitutional: She is oriented to person, place, and time. She appears well-developed and well-nourished. No distress.  HENT:  Head: Normocephalic and atraumatic.  Right Ear: External ear normal.  Left Ear: External ear normal.  Mouth/Throat: Oropharynx is clear and moist. No oropharyngeal exudate.  Eyes: Pupils are equal, round, and reactive to light. Conjunctivae and EOM are normal. Right eye exhibits no discharge. Left eye exhibits no discharge. No scleral icterus.  Neck: Normal range of motion. Neck supple. No JVD present. No tracheal deviation present. No thyromegaly present.  Cardiovascular: Normal rate, regular rhythm, normal heart sounds and intact distal pulses. Exam reveals no gallop and no friction rub.  No murmur heard. Pulmonary/Chest: Effort normal and breath sounds normal. No respiratory distress. She has no wheezes. She has no rales. She exhibits no tenderness.  Abdominal: Soft. Bowel sounds are normal. She exhibits no distension and no mass. There is no tenderness. There is no rebound and no  guarding.  Musculoskeletal: Normal range of motion. She exhibits no edema or tenderness.  Lymphadenopathy:    She has no cervical adenopathy.  Neurological: She is alert and oriented to person, place, and time. She has normal reflexes. No cranial nerve deficit. She exhibits normal muscle tone. Coordination normal.  Skin: Skin is warm and dry. No rash noted. She is not diaphoretic.  No erythema. No pallor.  Psychiatric: She has a normal mood and affect. Her behavior is normal. Judgment and thought content normal.  Vitals reviewed.  Vitals:   10/16/17 0945  BP: 128/70  Pulse: 90  SpO2: 95%  Weight: 157 lb 3.2 oz (71.3 kg)  Height: 5\' 4"  (1.626 m)       Assessment:       ICD-10-CM   1. Stage 3 severe COPD by GOLD classification (HCC) J44.9   2. Nodule of middle lobe of right lung R91.1        Plan:     Stage 3 severe COPD by GOLD classification ([place]) - stable disease  - continue incruse and breo scheduled; take 1 month worth of sample  - consider 90 day supply to lower cost - use albuterol as needed - flu shot in fall - prevnar vaccine 10/16/2017 (noticed you have never had this) - Please talk to PCP Carol Ada, MD -  and ensure you get  shingarix vaccine    Nodule of middle lobe of right lung - do 1 year CT in dec 2019 - will call with result  Followup wil call with CT results in dec 2019 Otherwise 4-6 months followup    Dr. Brand Males, M.D., [facility]C.P Pulmonary and Critical Care Medicine Staff Physician, [place] Director - Interstitial Lung Disease  Program  Pulmonary [place] [facility] [place], [place], [address]  Pager: [phone], If no answer or between  15:00h - 7:00h: call [phone] Telephone: [phone]

## 2017-10-19 ENCOUNTER — Ambulatory Visit (INDEPENDENT_AMBULATORY_CARE_PROVIDER_SITE_OTHER): Payer: Medicare Other | Admitting: "Endocrinology

## 2017-12-07 ENCOUNTER — Other Ambulatory Visit (INDEPENDENT_AMBULATORY_CARE_PROVIDER_SITE_OTHER): Payer: Self-pay | Admitting: "Endocrinology

## 2017-12-07 DIAGNOSIS — E034 Atrophy of thyroid (acquired): Secondary | ICD-10-CM

## 2018-01-28 ENCOUNTER — Ambulatory Visit (HOSPITAL_BASED_OUTPATIENT_CLINIC_OR_DEPARTMENT_OTHER)
Admission: RE | Admit: 2018-01-28 | Discharge: 2018-01-28 | Disposition: A | Discharge status: Home / Self Care | Payer: Medicare Other | Source: Ambulatory Visit | Attending: Internal Medicine | Admitting: Internal Medicine

## 2018-01-28 DIAGNOSIS — R911 Solitary pulmonary nodule: Secondary | ICD-10-CM | POA: Diagnosis present

## 2018-02-01 ENCOUNTER — Ambulatory Visit: Payer: Medicare Other | Admitting: Internal Medicine

## 2018-02-01 ENCOUNTER — Encounter: Payer: Self-pay | Admitting: Internal Medicine

## 2018-02-01 VITALS — BP 124/78 | HR 87 | Ht 64.57 in | Wt 158.0 lb

## 2018-02-01 DIAGNOSIS — J449 Chronic obstructive pulmonary disease, unspecified: Secondary | ICD-10-CM

## 2018-02-01 DIAGNOSIS — R911 Solitary pulmonary nodule: Secondary | ICD-10-CM | POA: Diagnosis not present

## 2018-02-01 DIAGNOSIS — I2510 Atherosclerotic heart disease of native coronary artery without angina pectoris: Secondary | ICD-10-CM | POA: Diagnosis not present

## 2018-02-01 MED ORDER — FLUTICASONE-UMECLIDIN-VILANT 100-62.5-25 MCG/INH IN AEPB: 1.0000 | INHALATION_SPRAY | Freq: Every day | RESPIRATORY_TRACT | 5 refills | Status: AC

## 2018-02-01 NOTE — Patient Instructions (Addendum)
ICD-10-CM   1. Stage 3 severe COPD by GOLD classification ([employer]) J44.9   2. Nodule of middle lobe of right lung R91.1   3. Coronary artery calcification seen on CAT scan I25.10     Stage 3 severe COPD by GOLD classification ([place])  - stable - normal CO2 level in dec 2017; 2 years ago  - continue incruse and breo this month and next month/next year 2020 - get trelegy to replace both incruse and breo - use albuterol as needed  Nodule of middle lobe of right lung  - improved dec 2019 compared to dec 2018 - might consider a followup CT dec 2020  Coronary artery calcification seen on CAT scan - seen in 1 vessel in dec 2019  - please talk with PCP Carol Ada, MD   Followup  6 months or sooner; CAT score at followup

## 2018-02-01 NOTE — Progress Notes (Signed)
IOV 02/11/2017  Chief Complaint  Patient presents with  . Advice Only    Self referral for COPD. States she was dx by Dr. Carol Ada x2 years ago. Has SOB with exertion and anxiety and has an occ. cough. Denies any CP.   77 year old female with limited smoking history and quit many years ago.  She tells me for the last few years she has had insidious onset of shortness of breath that is mild to moderate in severity brought on with exertion relieved by rest.  It is stable overall.  Does not much of associated cough except except very mild.  She does have associated ACE inhibitor intake.  The dyspnea is stable.  A few years ago based on pulmonary function test done?  At [facility] location but I do not have the results patient was started on Spiriva.  There is a chest x-ray in the system from few to several years ago that shows hyperinflation personally visualized and confirmed the findings.  Few to several months ago was switched to incruse/.  She honestly does not know if the symptoms are helping her.  She denies any wheezing orthopnea proximal nocturnal dyspnea hemoptysis fever chills edema.  Some 6 weeks ago she describes what sounds like an asthma or COPD exacerbation treated with antibiotics and prednisone x2 and then back to baseline currently.  At this point in time she wants pulmonary support for her diagnosis of COPD.  Her current symptoms are detailed below.  Blood work August 2018 normal hemoglobin A1c and TSH  OV 04/01/2017  Chief Complaint  Patient presents with  . Follow-up    PFT done today and HRCT done 02/11/17.  Pt states she has been doing good.  No real complaints of cough other than acid reflux.   Follow-up investigation for COPD.  Since her last visit she had CT scan of the chest that does show bronchiectasis associated with emphysema.  Pulmonary function test today shows Gold stage II COPD but with significant bronchodilator response.  She feels good and stable.   Cough is very minimal.  Med review shows that she is just on anticholinergic.  She is not on inhaled corticosteroid or bronchodilator.  I noticed that she is on ACE inhibitor.  She tells me that she can cough significantly after a viral infection.  However at baseline she only has mild cough.  She room was having asthma as a child but she says she outgrew it.  Incidental finding of 5 mm right middle lobe nodule in December 2018 CT chest  New issue of her having to undergo upper endoscopy by Dr. Oletta Lamas at [facility].  She has had colonoscopy a few years ago under propofol and she tolerated this fine.  She says gastroenterology is not aware of her COPD diagnosis and the severity of impairment of the lung function although she is quite functional and has tolerated sedation before.   OV 10/16/2017  Chief Complaint  Patient presents with  . Follow-up    Pt states she has been having SOB due to the heat. Denies any cough or CP.    Follow-up Gold stage III COPD: Overall stable since last visit. In fact COPD cat score is improved significantly. It is only for an very minimal symptoms. This is on trouble inhaler therapy. She checked her inhaler technique with me and it looked good.She does she had a bad July because of the heat and humidity but she stated endorse. She is  grieving because she lost her little chihuahua at age 21 - [name] was his name     Alpha 1 - 129 and MM 02/18/18   Walking desaturation test on 02/11/2017 185 feet x 3 laps on ROOM AIR:  did not desaturate. Rest pulse ox was 96%, final pulse ox was 95%. HR response 99/min at rest to 105/min at peak exertion. Patient JOBETH PANGILINAN  Did not Desaturate < 88% . Cleophas Dunker Ancheta did not  Desaturated </= 3% points. Journe Hallmark Brodbeck yes did get tachyardic   IMPRESSION: ct chest 2018 1. Minimal scattered cylindrical bronchiectasis with associated minimal tree-in-bud opacity in the medial upper lobes and medial right middle lobe. Findings could  be due to atypical mycobacterial infection (MAI) . 2. Mild-to-moderate centrilobular emphysema with mild diffuse bronchial wall thickening, compatible with the provided history of COPD. 3. Two small solid pulmonary nodules in the right lung, largest with average diameter 5 mm in the right middle lobe. No follow-up needed if patient is low-risk (and has no known or suspected primary neoplasm). Non-contrast chest CT can be considered in 12 months if patient is high-risk. This recommendation follows the consensus statement: Guidelines for Management of Incidental Pulmonary Nodules Detected on CT Images: From the Fleischner Society 2017; Radiology 2017; 284:228-243. 4. One vessel coronary atherosclerosis.  Aortic Atherosclerosis (ICD10-I70.0) and Emphysema (ICD10-J43.9).   Electronically Signed   By: Ilona Sorrel M.D.   On: 02/11/2017 16:30  OV 02/01/2018  Subjective:  [name], female , DOB: 1941/01/15 , age 78 y.o. , MRN: [id number] , ADDRESS: [place] [id number]   02/01/2018 -   Chief Complaint  Patient presents with  . Follow-up    Pt states that her SOB has improved some, CT review   Follow-up Gold stage III COPD [MM phenotype] postbronchodilator FEV1 0.96 L / 46% with 26% bronchodilator response and DLCO 12.8/52% in February 2019.  CT scan of the chest with mild scattered bronchiectasis.  [name] 77 y.o. -returns for follow-up of Gold stage III COPD.  She is taking triple inhaler therapy in the form of Incruse and Breo.  Overall her symptoms are stable and well-controlled.  COPD CAT score is 7.  Not much cough some amount of shortness of breath no chest pain at all.  Doing really well overall.  Up-to-date with her vaccines.  She does tell me that primary care physician thought her carbon dioxide level on chemistry was high.  Review of her chart shows, dioxide level December 2017 was normal.  She is asking for Incruse samples because  she is in the donut hole.  However he did not have them.  We talked about starting Trelegy in January 2020 and she is open and willing to look into this option.  Lung nodules: She had CT chest December 2019.  Follow-up from 1 year ago.  Nodules are diminished.  New finding of coronary artery calcification on CT scan December 2019: One-vessel coronary artery calcification seen.  No chest pains.  No prior stress test history.  She says she will talk about it with her primary care physician rather than take a cardiology referral right now.     CAT COPD Symptom & Quality of Life Score (GSK trademark) 0 is no burden. 5 is highest burden 02/11/2017  10/16/2017  02/01/2018   Never Cough -> Cough all the time 1 0   No phlegm in chest -> Chest is full of phlegm 1  0   No chest tightness -> Chest feels very tight 3 0   No dyspnea for 1 flight stairs/hill -> Very dyspneic for 1 flight of stairs 4 1   No limitations for ADL at home -> Very limited with ADL at home 1 0   Confident leaving home -> Not at all confident leaving home 0 0   Sleep soundly -> Do not sleep soundly because of lung condition 3 0   Lots of Energy -> No energy at all 3 3   TOTAL Score (max 40)  16 4 7     IMPRESSION CT CHEST 1. Right middle lobe small solid pulmonary nodules are stable to decreased since 02/11/2017 chest CT, considered benign. No new significant pulmonary nodules. 2. Stable minimal scattered cylindrical bronchiectasis in medial upper lobes and medial right middle lobe. No evidence of active pulmonary infection. 3. One vessel coronary atherosclerosis. 4. Small hiatal hernia.  Aortic Atherosclerosis (ICD10-I70.0) and Emphysema (ICD10-J43.9).   Electronically Signed   By: Ilona Sorrel M.D.   On: 01/28/2018 12:22  ROS - per HPI     has a past medical history of Arthritis, Combined hyperlipidemia, COPD (chronic obstructive pulmonary disease) ([place]), Coronary artery spasm ([place]), Dupuytren's  contracture, Dyspepsia, Familial tremor, Fatigue, Fibromyalgia syndrome, GERD (gastroesophageal reflux disease), Hypertension, Hypothyroidism, acquired, autoimmune, Multinodular goiter (nontoxic), Tachycardia, Thyroiditis, autoimmune, and Wears glasses.   reports that she quit smoking about 16 years ago. Her smoking use included cigarettes. She has a 10.50 pack-year smoking history. She has never used smokeless tobacco.  Past Surgical History:  Procedure Laterality Date  . BREAST EXCISIONAL BIOPSY Left    benign  . BREAST EXCISIONAL BIOPSY Right    milk gland removed  . BREAST LUMPECTOMY  1990   left  . BREAST LUMPECTOMY WITH NEEDLE LOCALIZATION Right 02/28/2013   Procedure: RIGHT BREAST NEEDLE LOCALIZATION LUMPECTOMY ;  Surgeon: Edward Jolly, MD;  Location: [facility];  Service: General;  Laterality: Right;  . CHOLECYSTECTOMY  2008   lapcholi  . DILATION AND CURETTAGE OF UTERUS    . LEFT HEART CATHETERIZATION WITH CORONARY ANGIOGRAM N/A 01/26/2013   Procedure: LEFT HEART CATHETERIZATION WITH CORONARY ANGIOGRAM;  Surgeon: Candee Furbish, MD;  Location: [facility];  Service: Cardiovascular;  Laterality: N/A;  . PARTIAL HYSTERECTOMY  1971  . TONSILLECTOMY      Allergies  Allergen Reactions  . Acyclovir And Related   . Amoxicillin Hives  . Codeine Nausea And Vomiting  . Lorabid [Loracarbef] Hives  . Sulfa Antibiotics Hives    Immunization History  Administered Date(s) Administered  . Influenza, High Dose Seasonal PF 12/12/2016, 11/27/2017  . Pneumococcal Conjugate-13 10/16/2017    Family History  Problem Relation Age of Onset  . Cancer Mother   . Hypertension Mother   . Stroke Mother   . Heart attack Father   . Sudden death Father   . Hypertension Sister   . Diabetes Sister   . Stroke Sister   . Thyroid disease Neg Hx      Current Outpatient Medications:  .  albuterol (PROVENTIL HFA;VENTOLIN HFA) 108 (90 Base) MCG/ACT inhaler, Inhale 2 puffs into the  lungs every 6 (six) hours as needed for wheezing or shortness of breath., Disp: 1 Inhaler, Rfl: 6 .  Cyanocobalamin (VITAMIN B 12 PO), Take by mouth daily., Disp: , Rfl:  .  DULoxetine (CYMBALTA) 60 MG capsule, Take 90 mg by mouth daily. , Disp: , Rfl:  .  losartan-hydrochlorothiazide (HYZAAR) 100-25 MG  tablet, Take 1 tablet by mouth daily., Disp: , Rfl:  .  Multiple Vitamin (MULTIVITAMIN) tablet, Take 1 tablet by mouth daily.  , Disp: , Rfl:  .  omeprazole (PRILOSEC) 20 MG capsule, Take 20 mg by mouth daily., Disp: , Rfl:  .  rosuvastatin (CRESTOR) 5 MG tablet, Take 5 mg by mouth daily., Disp: , Rfl:  .  SYNTHROID 88 MCG tablet, TAKE 1 TABLET (88 MCG TOTAL) BY MOUTH DAILY BEFORE BREAKFAST., Disp: 30 tablet, Rfl: 5 .  VITAMIN D, CHOLECALCIFEROL, PO, Take 50,000 Units by mouth once a week. , Disp: , Rfl:  .  Fluticasone-Umeclidin-Vilant (TRELEGY ELLIPTA) 100-62.5-25 MCG/INH AEPB, Inhale 1 puff into the lungs daily for 1 day., Disp: 60 each, Rfl: 5      Objective:   Vitals:   02/01/18 0920 02/01/18 0922  BP:  124/78  Pulse: 87 87  SpO2: 96% 96%  Weight:  158 lb (71.7 kg)  Height:  5' 4.57" (1.64 m)    Estimated body mass index is 26.65 kg/m as calculated from the following:   Height as of this encounter: 5' 4.57" (1.64 m).   Weight as of this encounter: 158 lb (71.7 kg).  @WEIGHTCHANGE @  [employer]   02/01/18 1245  Weight: 158 lb (71.7 kg)     Physical Exam  General Appearance:    Alert, cooperative, no distress, appears stated age - ni , Deconditioned looking - no , OBESE  - no, Sitting on Wheelchair -  no  Head:    Normocephalic, without obvious abnormality, atraumatic  Eyes:    PERRL, conjunctiva/corneas clear,  Ears:    Normal TM's and external ear canals, both ears  Nose:   Nares normal, septum midline, mucosa normal, no drainage    or sinus tenderness. OXYGEN ON  - no . Patient is @ ra   Throat:   Lips, mucosa, and tongue normal; teeth and gums normal. Cyanosis on  lips - no  Neck:   Supple, symmetrical, trachea midline, no adenopathy;    thyroid:  no enlargement/tenderness/nodules; no carotid   bruit or JVD  Back:     Symmetric, no curvature, ROM normal, no CVA tenderness  Lungs:     Distress - no , Wheeze no, Barrell Chest - no, Purse lip breathing - no, Crackles - no   Chest Wall:    No tenderness or deformity.    Heart:    Regular rate and rhythm, S1 and S2 normal, no rub   or gallop, Murmur - no  Breast Exam:    NOT DONE  Abdomen:     Soft, non-tender, bowel sounds active all four quadrants,    no masses, no organomegaly. Visceral obesity - yes  Genitalia:   NOT DONE  Rectal:   NOT DONE  Extremities:   Extremities - normal, Has Cane - no, Clubbing - no, Edema - no  Pulses:   2+ and symmetric all extremities  Skin:   Stigmata of Connective Tissue Disease - no  Lymph nodes:   Cervical, supraclavicular, and axillary nodes normal  Psychiatric:  Neurologic:   Pleasant - yes, Anxious - no, Flat affect - no  CAm-ICU - neg, Alert and Oriented x 3 - yes, Moves all 4s - yes, Speech - normal, Cognition - intact           Assessment:       ICD-10-CM   1. Stage 3 severe COPD by GOLD classification ([place]) J44.9   2. Nodule of middle  lobe of right lung R91.1   3. Coronary artery calcification seen on CAT scan I25.10        Plan:     Patient Instructions     ICD-10-CM   1. Stage 3 severe COPD by GOLD classification ([place]) J44.9   2. Nodule of middle lobe of right lung R91.1   3. Coronary artery calcification seen on CAT scan I25.10     Stage 3 severe COPD by GOLD classification ([place])  - stable - normal CO2 level in dec 2017; 2 years ago  - continue incruse and breo this month and next month/next year 2020 - get trelegy to replace both incruse and breo - use albuterol as needed  Nodule of middle lobe of right lung  - improved dec 2019 compared to dec 2018 - might consider a followup CT dec 2020  Coronary artery calcification seen on  CAT scan - seen in 1 vessel in dec 2019  - please talk with PCP Carol Ada, MD   Followup  6 months or sooner; CAT score at followup      SIGNATURE    Dr. Brand Males, M.D., F.C.C.P,  Pulmonary and Critical Care Medicine Staff Physician, [facility] Director - Interstitial Lung Disease  Program  Pulmonary [facility] [place], [place], [address]  Pager: [phone], If no answer or between  15:00h - 7:00h: call [phone] Telephone: [phone]  9:46 AM 02/01/2018

## 2018-02-28 ENCOUNTER — Encounter (HOSPITAL_BASED_OUTPATIENT_CLINIC_OR_DEPARTMENT_OTHER): Payer: Self-pay | Admitting: Emergency Medicine

## 2018-02-28 ENCOUNTER — Other Ambulatory Visit: Payer: Self-pay

## 2018-02-28 ENCOUNTER — Emergency Department (HOSPITAL_BASED_OUTPATIENT_CLINIC_OR_DEPARTMENT_OTHER)
Admission: EM | Admit: 2018-02-28 | Discharge: 2018-02-28 | Disposition: A | Discharge status: Home / Self Care | Payer: Medicare Other | Attending: Emergency Medicine | Admitting: Emergency Medicine

## 2018-02-28 ENCOUNTER — Emergency Department (HOSPITAL_BASED_OUTPATIENT_CLINIC_OR_DEPARTMENT_OTHER): Payer: Medicare Other

## 2018-02-28 DIAGNOSIS — Z87891 Personal history of nicotine dependence: Secondary | ICD-10-CM | POA: Diagnosis not present

## 2018-02-28 DIAGNOSIS — R0602 Shortness of breath: Secondary | ICD-10-CM | POA: Diagnosis present

## 2018-02-28 DIAGNOSIS — I10 Essential (primary) hypertension: Secondary | ICD-10-CM | POA: Insufficient documentation

## 2018-02-28 DIAGNOSIS — E039 Hypothyroidism, unspecified: Secondary | ICD-10-CM | POA: Diagnosis not present

## 2018-02-28 DIAGNOSIS — J441 Chronic obstructive pulmonary disease with (acute) exacerbation: Secondary | ICD-10-CM | POA: Diagnosis not present

## 2018-02-28 LAB — CBC WITH DIFFERENTIAL/PLATELET
Abs Immature Granulocytes: 0.11 10*3/uL — ABNORMAL HIGH (ref 0.00–0.07)
Basophils Absolute: 0.1 10*3/uL (ref 0.0–0.1)
Basophils Relative: 0 %
Eosinophils Absolute: 0.3 10*3/uL (ref 0.0–0.5)
Eosinophils Relative: 2 %
HCT: 37 % (ref 36.0–46.0)
Hemoglobin: 11.6 g/dL — ABNORMAL LOW (ref 12.0–15.0)
IMMATURE GRANULOCYTES: 1 %
Lymphocytes Relative: 21 %
Lymphs Abs: 2.9 10*3/uL (ref 0.7–4.0)
MCH: 29.5 pg (ref 26.0–34.0)
MCHC: 31.4 g/dL (ref 30.0–36.0)
MCV: 94.1 fL (ref 80.0–100.0)
Monocytes Absolute: 1.2 10*3/uL — ABNORMAL HIGH (ref 0.1–1.0)
Monocytes Relative: 8 %
Neutro Abs: 9.4 10*3/uL — ABNORMAL HIGH (ref 1.7–7.7)
Neutrophils Relative %: 68 %
Platelets: 382 10*3/uL (ref 150–400)
RBC: 3.93 MIL/uL (ref 3.87–5.11)
RDW: 12.3 % (ref 11.5–15.5)
WBC: 14 10*3/uL — ABNORMAL HIGH (ref 4.0–10.5)
nRBC: 0 % (ref 0.0–0.2)

## 2018-02-28 LAB — BASIC METABOLIC PANEL
Anion gap: 9 (ref 5–15)
BUN: 12 mg/dL (ref 8–23)
CO2: 29 mmol/L (ref 22–32)
Calcium: 9 mg/dL (ref 8.9–10.3)
Chloride: 98 mmol/L (ref 98–111)
Creatinine, Ser: 0.63 mg/dL (ref 0.44–1.00)
GFR calc Af Amer: >60 mL/min (ref >60)
GFR calc non Af Amer: >60 mL/min (ref >60)
Glucose, Bld: 133 mg/dL — ABNORMAL HIGH (ref 70–99)
Potassium: 3.5 mmol/L (ref 3.5–5.1)
Sodium: 136 mmol/L (ref 135–145)

## 2018-02-28 LAB — BRAIN NATRIURETIC PEPTIDE: B Natriuretic Peptide: 25.5 pg/mL (ref 0.0–100.0)

## 2018-02-28 LAB — TROPONIN I: Troponin I: <0.03 ng/mL (ref <0.03)

## 2018-02-28 MED ORDER — ALBUTEROL SULFATE HFA 108 (90 BASE) MCG/ACT IN AERS
2.0000 | INHALATION_SPRAY | RESPIRATORY_TRACT | Status: DC | PRN
  Administered 2018-02-28: 2 via RESPIRATORY_TRACT

## 2018-02-28 MED ORDER — ALBUTEROL SULFATE HFA 108 (90 BASE) MCG/ACT IN AERS: 1.0000 | INHALATION_SPRAY | Freq: Four times a day (QID) | RESPIRATORY_TRACT | 0 refills | Status: DC | PRN

## 2018-02-28 MED ORDER — ALBUTEROL SULFATE HFA 108 (90 BASE) MCG/ACT IN AERS
2.0000 | INHALATION_SPRAY | Freq: Once | RESPIRATORY_TRACT | Status: DC
  Filled 2018-02-28: qty 6.7

## 2018-02-28 MED ORDER — IPRATROPIUM-ALBUTEROL 0.5-2.5 (3) MG/3ML IN SOLN: 3.0000 mL | Freq: Four times a day (QID) | RESPIRATORY_TRACT | Status: DC

## 2018-02-28 MED ORDER — PREDNISONE 20 MG PO TABS: 40.0000 mg | ORAL_TABLET | Freq: Every day | ORAL | 0 refills | Status: AC

## 2018-02-28 MED ORDER — IPRATROPIUM-ALBUTEROL 0.5-2.5 (3) MG/3ML IN SOLN
3.0000 mL | Freq: Four times a day (QID) | RESPIRATORY_TRACT | Status: DC
  Administered 2018-02-28 (×2): 3 mL via RESPIRATORY_TRACT
  Filled 2018-02-28 (×2): qty 3

## 2018-02-28 MED ORDER — METHYLPREDNISOLONE SODIUM SUCC 125 MG IJ SOLR
125.0000 mg | Freq: Once | INTRAMUSCULAR | Status: AC
  Administered 2018-02-28: 125 mg via INTRAVENOUS
  Filled 2018-02-28: qty 2

## 2018-02-28 NOTE — ED Notes (Signed)
ED Provider at bedside. 

## 2018-02-28 NOTE — Discharge Instructions (Signed)
We believe that your symptoms are caused today by an exacerbation of your COPD, and possibly bronchitis.  Please take the prescribed medications and any medications that you have at home for your COPD.  Follow up with your doctor as recommended.  If you develop any new or worsening symptoms, including but not limited to fever, persistent vomiting, worsening shortness of breath, or other symptoms that concern you, please return to the Emergency Department immediately. ° ° °Chronic Obstructive Pulmonary Disease °Chronic obstructive pulmonary disease (COPD) is a common lung condition in which airflow from the lungs is limited. COPD is a general term that can be used to describe many different lung problems that limit airflow, including both chronic bronchitis and emphysema.  If you have COPD, your lung function will probably never return to normal, but there are measures you can take to improve lung function and make yourself feel better.  °CAUSES  °Smoking (common).   °Exposure to secondhand smoke.   °Genetic problems. °Chronic inflammatory lung diseases or recurrent infections. °SYMPTOMS  °Shortness of breath, especially with physical activity.   °Deep, persistent (chronic) cough with a large amount of thick mucus.   °Wheezing.   °Rapid breaths (tachypnea).   °Gray or bluish discoloration (cyanosis) of the skin, especially in fingers, toes, or lips.   °Fatigue.   °Weight loss.   °Frequent infections or episodes when breathing symptoms become much worse (exacerbations).   °Chest tightness. °DIAGNOSIS  °Your health care provider will take a medical history and perform a physical examination to make the initial diagnosis.  Additional tests for COPD may include:  °Lung (pulmonary) function tests. °Chest X-ray. °CT scan. °Blood tests. °TREATMENT  °Treatment available to help you feel better when you have COPD includes:  °Inhaler and nebulizer medicines. These help manage the symptoms of COPD and make your breathing more  comfortable. °Supplemental oxygen. Supplemental oxygen is only helpful if you have a low oxygen level in your blood.   °Exercise and physical activity. These are beneficial for nearly all people with COPD. Some people may also benefit from a pulmonary rehabilitation program. °HOME CARE INSTRUCTIONS  °Take all medicines (inhaled or pills) as directed by your health care provider. °Avoid over-the-counter medicines or cough syrups that dry up your airway (such as antihistamines) and slow down the elimination of secretions unless instructed otherwise by your health care provider.   °If you are a smoker, the most important thing that you can do is stop smoking. Continuing to smoke will cause further lung damage and breathing trouble. Ask your health care provider for help with quitting smoking. He or she can direct you to community resources or hospitals that provide support. °Avoid exposure to irritants such as smoke, chemicals, and fumes that aggravate your breathing. °Use oxygen therapy and pulmonary rehabilitation if directed by your health care provider. If you require home oxygen therapy, ask your health care provider whether you should purchase a pulse oximeter to measure your oxygen level at home.   °Avoid contact with individuals who have a contagious illness. °Avoid extreme temperature and humidity changes. °Eat healthy foods. Eating smaller, more frequent meals and resting before meals may help you maintain your strength. °Stay active, but balance activity with periods of rest. Exercise and physical activity will help you maintain your ability to do things you want to do. °Preventing infection and hospitalization is very important when you have COPD. Make sure to receive all the vaccines your health care provider recommends, especially the pneumococcal and influenza   vaccines. Ask your health care provider whether you need a pneumonia vaccine. °Learn and use relaxation techniques to manage stress. °Learn and  use controlled breathing techniques as directed by your health care provider. Controlled breathing techniques include:   °Pursed lip breathing. Start by breathing in (inhaling) through your nose for 1 second. Then, purse your lips as if you were going to whistle and breathe out (exhale) through the pursed lips for 2 seconds.   °Diaphragmatic breathing. Start by putting one hand on your abdomen just above your waist. Inhale slowly through your nose. The hand on your abdomen should move out. Then purse your lips and exhale slowly. You should be able to feel the hand on your abdomen moving in as you exhale.   °Learn and use controlled coughing to clear mucus from your lungs. Controlled coughing is a series of short, progressive coughs. The steps of controlled coughing are:   °Lean your head slightly forward.   °Breathe in deeply using diaphragmatic breathing.   °Try to hold your breath for 3 seconds.   °Keep your mouth slightly open while coughing twice.   °Spit any mucus out into a tissue.   °Rest and repeat the steps once or twice as needed. °SEEK MEDICAL CARE IF:  °You are coughing up more mucus than usual.   °There is a change in the color or thickness of your mucus.   °Your breathing is more labored than usual.   °Your breathing is faster than usual.   °SEEK IMMEDIATE MEDICAL CARE IF:  °You have shortness of breath while you are resting.   °You have shortness of breath that prevents you from: °Being able to talk.   °Performing your usual physical activities.   °You have chest pain lasting longer than 5 minutes.   °Your skin color is more cyanotic than usual. °You measure low oxygen saturations for longer than 5 minutes with a pulse oximeter. °MAKE SURE YOU:  °Understand these instructions. °Will watch your condition. °Will get help right away if you are not doing well or get worse. °Document Released: 11/20/2004 Document Revised: 06/27/2013 Document Reviewed: 10/07/2012 °[employer]® Patient Information ©2015  [employer]. This information is not intended to replace advice given to you by your health care provider. Make sure you discuss any questions you have with your health care provider. ° °

## 2018-02-28 NOTE — ED Provider Notes (Signed)
Emergency Department Provider Note   I have reviewed the triage vital signs and the nursing notes.   HISTORY  Chief Complaint Shortness of Breath   HPI Shelby Lin is a 78 y.o. female with PMH of COPD, GERD, and HTN presents to the emergency department for evaluation of cough over the past 2 to 3 days with worsening exertional dyspnea.  The patient states that she saw her PCP for cough 2 days ago and was started on a Z-Pak.  This is day 3 of the Z-Pak but states she is feeling progressively worse.  She is having significant shortness of breath with even minimal exertion.  She is feeling associated chest heaviness that is also worse with exertion.  She does have COPD and has been compliant with her medications.  She denies fevers or chills.  No productive cough or hemoptysis.  Denies any vomiting or diarrhea.  No body aches.  Today the patient was feeling very short of breath and a family member placed a pulse ox on her finger which read in the 39s.  EMS arrived and confirmed this reading.  He was given nonrebreather oxygen and improved with rest.   Past Medical History:  Diagnosis Date  . Arthritis   . Combined hyperlipidemia   . COPD (chronic obstructive pulmonary disease) ([place])   . Coronary artery spasm ([place])   . Dupuytren's contracture   . Dyspepsia   . Familial tremor   . Fatigue   . Fibromyalgia syndrome   . GERD (gastroesophageal reflux disease)   . Hypertension   . Hypothyroidism, acquired, autoimmune   . Multinodular goiter (nontoxic)   . Tachycardia   . Thyroiditis, autoimmune   . Wears glasses     Patient Active Problem List   Diagnosis Date Noted  . Overweight 10/13/2016  . Goiter 11/02/2013  . Hoarseness 11/02/2013  . Action tremor 05/18/2013  . Abnormal stress test 01/26/2013  . Angina decubitus ([place]) 01/26/2013  . Chronic tension headaches 04/06/2012  . Chronic night sweats 04/06/2012  . Meniere's disease 04/06/2012  . Chronic leg pain 04/06/2012  .  Hypothyroidism, acquired, autoimmune   . Fibromyalgia syndrome   . Thyroiditis, autoimmune   . Multinodular goiter (nontoxic)   . Tachycardia   . Familial tremor   . Coronary artery spasm ([place])   . Dyspepsia   . Fatigue   . Combined hyperlipidemia   . Dupuytren's contracture   . 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 BREAST EXCISIONAL BIOPSY Left    benign  . BREAST EXCISIONAL BIOPSY Right    milk gland removed  . BREAST LUMPECTOMY  1990   left  . BREAST LUMPECTOMY WITH NEEDLE LOCALIZATION Right 02/28/2013   Procedure: RIGHT BREAST NEEDLE LOCALIZATION LUMPECTOMY ;  Surgeon: Edward Jolly, MD;  Location: [facility];  Service: General;  Laterality: Right;  . CHOLECYSTECTOMY  2008   lapcholi  . DILATION AND CURETTAGE OF UTERUS    . LEFT HEART CATHETERIZATION WITH CORONARY ANGIOGRAM N/A 01/26/2013   Procedure: LEFT HEART CATHETERIZATION WITH CORONARY ANGIOGRAM;  Surgeon: Candee Furbish, MD;  Location: [facility] CATH LAB;  Service: Cardiovascular;  Laterality: N/A;  . PARTIAL HYSTERECTOMY  1971  . TONSILLECTOMY     Allergies Acyclovir and related; Amoxicillin; Codeine; Lorabid [loracarbef]; and Sulfa antibiotics  Family History  Problem Relation Age of Onset  . Cancer Mother   . Hypertension Mother   . Stroke  Mother   . Heart attack Father   . Sudden death Father   . Hypertension Sister   . Diabetes Sister   . Stroke Sister   . Thyroid disease Neg Hx     Social History Social History   Tobacco Use  . Smoking status: Former Smoker    Packs/day: 0.25    Years: 42.00    Pack years: 10.50    Types: Cigarettes    Last attempt to quit: 02/21/2001    Years since quitting: 17.0  . Smokeless tobacco: Never Used  Substance Use Topics  . Alcohol use: No  . Drug use: No    Review of Systems  Constitutional: No fever/chills Eyes: No visual changes. ENT: No sore throat. Positive  cough.  Cardiovascular: Positive chest pain. Respiratory: Positive shortness of breath. Gastrointestinal: No abdominal pain.  No nausea, no vomiting.  No diarrhea.  No constipation. Genitourinary: Negative for dysuria. Musculoskeletal: Negative for back pain. Skin: Negative for rash. Neurological: Negative for headaches, focal weakness or numbness.  10-point ROS otherwise negative.  ____________________________________________   PHYSICAL EXAM:  VITAL SIGNS: ED Triage Vitals  Enc Vitals Group     BP 02/28/18 1704 (!) 148/81     Pulse Rate 02/28/18 1704 98     Resp 02/28/18 1704 (!) 24     Temp 02/28/18 1704 98.2 F (36.8 C)     Temp Source 02/28/18 1704 Oral     SpO2 02/28/18 1704 95 %     Weight 02/28/18 1701 162 lb (73.5 kg)     Height 02/28/18 1701 5' 4.5" (1.638 m)     Pain Score 02/28/18 1701 0   Constitutional: Alert and oriented. Well appearing and in no acute distress. Eyes: Conjunctivae are normal.  Head: Atraumatic. Nose: No congestion/rhinnorhea. Mouth/Throat: Mucous membranes are moist. Neck: No stridor.  Cardiovascular: Normal rate, regular rhythm. Good peripheral circulation. Grossly normal heart sounds.   Respiratory: Normal respiratory effort.  No retractions. Lungs diminished a the left base with bronchospastic cough.  Gastrointestinal: Soft and nontender. No distention.  Musculoskeletal: No lower extremity tenderness nor edema. No gross deformities of extremities. Neurologic:  Normal speech and language. No gross focal neurologic deficits are appreciated.  Skin:  Skin is warm, dry and intact. No rash noted.  ____________________________________________   LABS (all labs ordered are listed, but only abnormal results are displayed)  Labs Reviewed  BASIC METABOLIC PANEL - Abnormal; Notable for the following components:      Result Value   Glucose, Bld 133 (*)    All other components within normal limits  CBC WITH DIFFERENTIAL/PLATELET - Abnormal;  Notable for the following components:   WBC 14.0 (*)    Hemoglobin 11.6 (*)    Neutro Abs 9.4 (*)    Monocytes Absolute 1.2 (*)    Abs Immature Granulocytes 0.11 (*)    All other components within normal limits  BRAIN NATRIURETIC PEPTIDE  TROPONIN I   ____________________________________________  EKG   EKG Interpretation  Date/Time:  Sunday February 28 2018 17:04:54 EST Ventricular Rate:  96 PR Interval:    QRS Duration: 130 QT Interval:  375 QTC Calculation: 474 R Axis:   68 Text Interpretation:  Sinus rhythm Nonspecific intraventricular conduction delay No STEMI.  Confirmed by Nanda Quinton [id number]) on 02/28/2018 5:17:48 PM       ____________________________________________  RADIOLOGY  Dg Chest 2 View  Result Date: 02/28/2018 CLINICAL DATA:  Shortness of breath, cough, cold symptoms for 1 week, history COPD, coronary  artery disease, hypertension EXAM: CHEST - 2 VIEW COMPARISON:  03/23/2008 Correlation CT chest 01/28/2018 FINDINGS: Normal heart size, mediastinal contours, and pulmonary vascularity. Atherosclerotic calcification aorta. Emphysematous and central bronchitic changes consistent with COPD. No acute infiltrate, pleural effusion, or pneumothorax. Bones demineralized. IMPRESSION: COPD changes without acute infiltrate. Electronically Signed   By: Lavonia Dana M.D.   On: 02/28/2018 17:46    ____________________________________________   PROCEDURES  Procedure(s) performed:   Procedures  CRITICAL CARE Performed by: Margette Fast Total critical care time: 35 minutes Critical care time was exclusive of separately billable procedures and treating other patients. Critical care was necessary to treat or prevent imminent or life-threatening deterioration. Critical care was time spent personally by me on the following activities: development of treatment plan with patient and/or surrogate as well as nursing, discussions with consultants, evaluation of patient's response to  treatment, examination of patient, obtaining history from patient or surrogate, ordering and performing treatments and interventions, ordering and review of laboratory studies, ordering and review of radiographic studies, pulse oximetry and re-evaluation of patient's condition.  Nanda Quinton, MD Emergency Medicine  ____________________________________________   INITIAL IMPRESSION / ASSESSMENT AND PLAN / ED COURSE  Pertinent labs & imaging results that were available during my care of the patient were reviewed by me and considered in my medical decision making (see chart for details).  Patient presents to the emergency department for evaluation of shortness of breath with hypoxia at home.  She has COPD but is not on baseline oxygen.  She is not requiring oxygen at rest here but with even minimal exertion she becomes very short of breath.  She does not appear volume overloaded.  EKG with nonspecific ST changes.   Lab work and chest x-ray reviewed.  Patient does have leukocytosis with no clear infection source.  Possible viral infection.  Patient without significant flulike symptoms.  No infiltrate on chest x-ray.  Patient given steroid and multiple nebs.  She is feeling much better on reassessment and is ambulatory here with only mild dyspnea and no hypoxemia.  Offered observational admission but patient states she feels her breathing is significantly improved and would like to manage her symptoms at home.  Discussed ED return precautions in detail. ____________________________________________  FINAL CLINICAL IMPRESSION(S) / ED DIAGNOSES  Final diagnoses:  COPD exacerbation ([place])    MEDICATIONS GIVEN DURING THIS VISIT:  Medications  ipratropium-albuterol (DUONEB) 0.5-2.5 (3) MG/3ML nebulizer solution 3 mL (3 mLs Nebulization Given 02/28/18 1920)  albuterol (PROVENTIL HFA;VENTOLIN HFA) 108 (90 Base) MCG/ACT inhaler 2 puff (2 puffs Inhalation Given 02/28/18 1941)  methylPREDNISolone sodium  succinate (SOLU-MEDROL) 125 mg/2 mL injection 125 mg (125 mg Intravenous Given 02/28/18 2019)     NEW OUTPATIENT MEDICATIONS STARTED DURING THIS VISIT:  Discharge Medication List as of 02/28/2018  8:07 PM    START taking these medications   Details  predniSONE (DELTASONE) 20 MG tablet Take 2 tablets (40 mg total) by mouth daily for 4 days., Starting Mon 03/01/2018, Until Fri 03/05/2018, Print        Note:  This document was prepared using Dragon voice recognition software and may include unintentional dictation errors.  Nanda Quinton, MD Emergency Medicine    Danel Requena, [facility], MD 02/28/18 [phone]

## 2018-02-28 NOTE — ED Notes (Signed)
Pt ambulated around dept. SATS 87-93%. HR 111-107. Pt very winded on return to her room with aduble wheezes. SATS 90%

## 2018-02-28 NOTE — ED Triage Notes (Signed)
Pt here with cold sx and was put on azithromycin a few days ago. Walking today to bathroom and was SOB so called 911. EMS and fire recorded SpO2 in the 70's. Was put on nonrebreahter and went back to normal SpO2. Walked to stretcher and desatted again. Lung sounds clear in all fields.

## 2018-03-01 ENCOUNTER — Ambulatory Visit (INDEPENDENT_AMBULATORY_CARE_PROVIDER_SITE_OTHER): Payer: Medicare Other | Admitting: Pulmonary Disease

## 2018-03-01 ENCOUNTER — Encounter: Payer: Self-pay | Admitting: Pulmonary Disease

## 2018-03-01 DIAGNOSIS — J449 Chronic obstructive pulmonary disease, unspecified: Secondary | ICD-10-CM | POA: Insufficient documentation

## 2018-03-01 DIAGNOSIS — J441 Chronic obstructive pulmonary disease with (acute) exacerbation: Secondary | ICD-10-CM | POA: Diagnosis not present

## 2018-03-01 MED ORDER — FLUTICASONE-UMECLIDIN-VILANT 100-62.5-25 MCG/INH IN AEPB: 1.0000 | INHALATION_SPRAY | Freq: Every day | RESPIRATORY_TRACT | 0 refills | Status: DC

## 2018-03-01 MED ORDER — IPRATROPIUM-ALBUTEROL 0.5-2.5 (3) MG/3ML IN SOLN
3.0000 mL | Freq: Once | RESPIRATORY_TRACT | Status: AC
  Administered 2018-03-01: 3 mL via RESPIRATORY_TRACT

## 2018-03-01 NOTE — Assessment & Plan Note (Addendum)
Assessment: Inspiratory and expiratory wheezing on exam Patient is visibly anxious Patient took Kellogg as well as Incruse Ellipta today Patient has not started 40 mg of prednisone as prescribed last night in the emergency room February/2019 pulmonary function test showing gold 3 COPD mMRC 4 today Chest x-ray from 02/28/2018 shows COPD changes but no infiltrate, effusion, or pneumothorax  Plan: DuoNeb today >>>resolved insp wheeze, improved exp wheeze Take prednisone provided by the emergency department 40 mg daily for 5 days Trial of Trelegy Ellipta, sample provided today, this replaces Breo Ellipta and Incruse Ellipta Patient to follow-up in 2 to 4 weeks to ensure symptoms are improving

## 2018-03-01 NOTE — Progress Notes (Signed)
@Patient  ID: Shelby Lin, female    DOB: May 03, 1940, 78 y.o.   MRN: [id number]  Chief Complaint  Patient presents with  . Acute Visit    Referring provider: Carol Ada, MD  HPI:  78 year old female former smoker followed in our office for gold 3 COPD  Smoker/ Smoking History: Former smoker.  Quit 2002.  21-pack-year smoking history. Maintenance: Breo Ellipta 200, Incruse Ellipta Pt of: [name]  03/01/2018  - Visit   78 year old female former smoker presenting today for an acute visit.  Patient was seen in the emergency room on 02/28/2018 for COPD exacerbation.  Patient was treated with Solu-Medrol with respiratory improvement and was discharged on 40 mg of prednisone daily for 5 days.  Unfortunately patient has not had the opportunity to pick up those medications yet.  Patient plans to pick this up after our office visit today.  Patient reports that her symptoms started on 02/21/2018 with cold and upper respiratory-like symptoms.  Patient was seen by primary care on 02/26/2018 and was given a Z-Pak but no steroids.  Patient then had to call 911 on 02/28/2018 due to increased shortness of breath and difficulty breathing.  Patient was transported to the ED.  Patient reports adherence to Brio Ellipta and Incruse Ellipta.  Patient reports a dry cough, extreme fatigue, and wheezing.  MMRC - Breathlessness Score 4 - I am too breathless to leave the house or I am breathlessness when dressing    Tests:   02/28/2018-chest x-ray- COPD changes without acute infiltrate, emphysematous and central bronchitic changes consistent with COPD  01/28/2018-CT chest without contrast-right middle lobe small solid pulmonary nodules are stable decreasing comparison at 02/11/2017 chest CT, considered benign, stable minimal scattered cylindrical bronchiectasis medial upper lobes and medial right middle lobe  02/11/2017-CT chest high-res- minimal scattered cylindrical bronchiectasis with associated minimal  tree-in-bud opacity in the medial upper lobes and medial right middle lobes, findings could be consistent of MAI >>>Mild to moderate centrilobular emphysema with mild diffuse bronchial wall thickening, 2 small solid pulmonary nodules in right lung largest with average diameter 5 mm in the right middle lobe  04/01/2017-pulmonary function test- FVC 1.69 (61% predicted), postbronchodilator ratio 48, postbronchodilator FEV1 46, positive bronchodilator response, mid flow reversibility after bronchodilator, DLCO 52 >>>Severe obstructive airways disease >>>Patient did take Incruse 1 puff at 8 AM prior to testing  FENO:  No results found for: NITRICOXIDE  PFT: PFT Results Latest Ref Rng & Units 04/01/2017  FVC-Pre L 1.69  FVC-Predicted Pre % 61  FVC-Post L 2.02  FVC-Predicted Post % 73  Pre FEV1/FVC % % 45  Post FEV1/FCV % % 48  FEV1-Pre L 0.76  FEV1-Predicted Pre % 36  FEV1-Post L 0.96  DLCO UNC% % 52  DLCO COR %Predicted % 84    Imaging: Dg Chest 2 View  Result Date: 02/28/2018 CLINICAL DATA:  Shortness of breath, cough, cold symptoms for 1 week, history COPD, coronary artery disease, hypertension EXAM: CHEST - 2 VIEW COMPARISON:  03/23/2008 Correlation CT chest 01/28/2018 FINDINGS: Normal heart size, mediastinal contours, and pulmonary vascularity. Atherosclerotic calcification aorta. Emphysematous and central bronchitic changes consistent with COPD. No acute infiltrate, pleural effusion, or pneumothorax. Bones demineralized. IMPRESSION: COPD changes without acute infiltrate. Electronically Signed   By: Lavonia Dana M.D.   On: 02/28/2018 17:46   Chart Review:  02/28/2018- emergency room visit-COPD exacerbation >>>Plan: Solu-Medrol injection, DuoNeb, prednisone 40mg  for 5d    Specialty Problems      Pulmonary Problems  GOLD COPD III D    02/11/2017-CT chest high-res- minimal scattered cylindrical bronchiectasis with associated minimal tree-in-bud opacity in the medial upper lobes and  medial right middle lobes, findings could be consistent of MAI >>>Mild to moderate centrilobular emphysema with mild diffuse bronchial wall thickening, 2 small solid pulmonary nodules in right lung largest with average diameter 5 mm in the right middle lobe  04/01/2017-pulmonary function test- FVC 1.69 (61% predicted), postbronchodilator ratio 48, postbronchodilator FEV1 46, positive bronchodilator response, mid flow reversibility after bronchodilator, DLCO 52 >>>Severe obstructive airways disease >>>Patient did take Incruse 1 puff at 8 AM prior to testing  02/28/2018-chest x-ray- COPD changes without acute infiltrate, emphysematous and central bronchitic changes consistent with COPD  02/28/2018 - ED - COPD exacerbation          Allergies  Allergen Reactions  . Acyclovir And Related   . Amoxicillin Hives  . Codeine Nausea And Vomiting  . Lorabid [Loracarbef] Hives  . Sulfa Antibiotics Hives    Immunization History  Administered Date(s) Administered  . Influenza, High Dose Seasonal PF 12/12/2016, 11/27/2017  . Pneumococcal Conjugate-13 10/16/2017    Past Medical History:  Diagnosis Date  . Arthritis   . Combined hyperlipidemia   . COPD (chronic obstructive pulmonary disease) ([place])   . Coronary artery spasm ([place])   . Dupuytren's contracture   . Dyspepsia   . Familial tremor   . Fatigue   . Fibromyalgia syndrome   . GERD (gastroesophageal reflux disease)   . Hypertension   . Hypothyroidism, acquired, autoimmune   . Multinodular goiter (nontoxic)   . Tachycardia   . Thyroiditis, autoimmune   . Wears glasses     Tobacco History: Social History   Tobacco Use  Smoking Status Former Smoker  . Packs/day: 0.50  . Years: 42.00  . Pack years: 21.00  . Types: Cigarettes  . Last attempt to quit: 02/21/2001  . Years since quitting: 17.0  Smokeless Tobacco Never Used   Counseling given: Yes  Continue not smoking  Outpatient Encounter Medications as of 03/01/2018    Medication Sig  . albuterol (PROVENTIL HFA;VENTOLIN HFA) 108 (90 Base) MCG/ACT inhaler Inhale 1-2 puffs into the lungs every 6 (six) hours as needed for wheezing or shortness of breath.  . Cyanocobalamin (VITAMIN B 12 PO) Take by mouth daily.  . DULoxetine (CYMBALTA) 60 MG capsule Take 90 mg by mouth daily.   . fluticasone furoate-vilanterol (BREO ELLIPTA) 100-25 MCG/INH AEPB Inhale 1 puff into the lungs daily.  Marland Kitchen losartan-hydrochlorothiazide (HYZAAR) 100-25 MG tablet Take 1 tablet by mouth daily.  . Multiple Vitamin (MULTIVITAMIN) tablet Take 1 tablet by mouth daily.    Marland Kitchen omeprazole (PRILOSEC) 20 MG capsule Take 20 mg by mouth daily.  . predniSONE (DELTASONE) 20 MG tablet Take 2 tablets (40 mg total) by mouth daily for 4 days.  . rosuvastatin (CRESTOR) 5 MG tablet Take 5 mg by mouth daily.  Marland Kitchen SYNTHROID 88 MCG tablet TAKE 1 TABLET (88 MCG TOTAL) BY MOUTH DAILY BEFORE BREAKFAST.  Marland Kitchen umeclidinium bromide (INCRUSE ELLIPTA) 62.5 MCG/INH AEPB Inhale 1 puff into the lungs daily.  Marland Kitchen VITAMIN D, CHOLECALCIFEROL, PO Take 50,000 Units by mouth once a week.   . Fluticasone-Umeclidin-Vilant (TRELEGY ELLIPTA) 100-62.5-25 MCG/INH AEPB Inhale 1 puff into the lungs daily.  . [EXPIRED] ipratropium-albuterol (DUONEB) 0.5-2.5 (3) MG/3ML nebulizer solution 3 mL    No facility-administered encounter medications on file as of 03/01/2018.      Review of Systems  Review of Systems  Constitutional:  Positive for activity change, appetite change and fatigue. Negative for chills and fever.  HENT: Positive for congestion. Negative for sinus pressure and sinus pain.   Respiratory: Positive for cough (dry cough, one time prod with clear mucous ), shortness of breath and wheezing.   Cardiovascular: Negative for chest pain and palpitations.  Gastrointestinal: Negative for diarrhea, nausea and vomiting.  Psychiatric/Behavioral: The patient is nervous/anxious.      Physical Exam  BP 112/70 (BP Location: Left Arm, Cuff  Size: Normal)   Pulse (!) 104   Ht 5' 4.5" (1.638 m)   Wt 158 lb 3.2 oz (71.8 kg)   SpO2 91%   BMI 26.74 kg/m   Wt Readings from Last 5 Encounters:  03/01/18 158 lb 3.2 oz (71.8 kg)  02/28/18 162 lb (73.5 kg)  02/01/18 158 lb (71.7 kg)  10/16/17 157 lb 3.2 oz (71.3 kg)  10/07/17 155 lb 12.8 oz (70.7 kg)   Room air  Physical Exam  Constitutional: She is oriented to person, place, and time and well-developed, well-nourished, and in no distress. No distress.  Anxious elderly female  HENT:  Head: Normocephalic and atraumatic.  Right Ear: Hearing, tympanic membrane, external ear and ear canal normal.  Left Ear: Hearing, tympanic membrane, external ear and ear canal normal.  Nose: Nose normal. Right sinus exhibits no maxillary sinus tenderness and no frontal sinus tenderness. Left sinus exhibits no maxillary sinus tenderness and no frontal sinus tenderness.  Mouth/Throat: Uvula is midline and oropharynx is clear and moist. No oropharyngeal exudate.  Eyes: Pupils are equal, round, and reactive to light.  Neck: Normal range of motion. Neck supple. No JVD present.  Cardiovascular: Regular rhythm and normal heart sounds. Tachycardia present.  Pulmonary/Chest: Effort normal. No accessory muscle usage. No respiratory distress. She has no decreased breath sounds. She has wheezes (Inspiratory and expiratory wheeze). She has no rhonchi.  Musculoskeletal: Normal range of motion.        General: No edema.  Lymphadenopathy:    She has no cervical adenopathy.  Neurological: She is alert and oriented to person, place, and time. Gait normal.  Skin: Skin is warm and dry. She is not diaphoretic. No erythema.  Psychiatric: Memory, affect and judgment normal. Her mood appears anxious. She does not exhibit a depressed mood.  Nursing note and vitals reviewed.     Lab Results:  CBC    Component Value Date/Time   WBC 14.0 (H) 02/28/2018 1732   RBC 3.93 02/28/2018 1732   HGB 11.6 (L) 02/28/2018  1732   HCT 37.0 02/28/2018 1732   PLT 382 02/28/2018 1732   MCV 94.1 02/28/2018 1732   MCH 29.5 02/28/2018 1732   MCHC 31.4 02/28/2018 1732   RDW 12.3 02/28/2018 1732   LYMPHSABS 2.9 02/28/2018 1732   MONOABS 1.2 (H) 02/28/2018 1732   EOSABS 0.3 02/28/2018 1732   BASOSABS 0.1 02/28/2018 1732    BMET    Component Value Date/Time   NA 136 02/28/2018 1732   K 3.5 02/28/2018 1732   CL 98 02/28/2018 1732   CO2 29 02/28/2018 1732   GLUCOSE 133 (H) 02/28/2018 1732   BUN 12 02/28/2018 1732   CREATININE 0.63 02/28/2018 1732   CREATININE 0.59 (L) 02/22/2016 0001   CALCIUM 9.0 02/28/2018 1732   CALCIUM 9.7 04/06/2012 1019   GFRNONAA >60 02/28/2018 1732   GFRAA >60 02/28/2018 1732    BNP    Component Value Date/Time   BNP 25.5 02/28/2018 1732    ProBNP No  results found for: PROBNP    Assessment & Plan:   GOLD COPD III D Assessment: Inspiratory and expiratory wheezing on exam Patient is visibly anxious Patient took [employer]ogg as well as Incruse Ellipta today Patient has not started 40 mg of prednisone as prescribed last night in the emergency room February/2019 pulmonary function test showing gold 3 COPD mMRC 4 today Chest x-ray from 02/28/2018 shows COPD changes but no infiltrate, effusion, or pneumothorax  Plan: DuoNeb today Take prednisone provided by the emergency department 40 mg daily for 5 days Trial of Trelegy Ellipta, sample provided today, this replaces Breo Ellipta and Incruse Ellipta Patient to follow-up in 2 to 4 weeks to ensure symptoms are improving     Lauraine Rinne, NP 03/01/2018   This appointment was 35 min with over 50% of the time in direct face-to-face patient care, assessment, plan of care, and follow-up.

## 2018-03-01 NOTE — Patient Instructions (Addendum)
DuoNeb breathing treatment today  Take the 40 mg of prednisone daily for 5 days that the emergency room provided for you >>> Take with food >>> Take in the morning  Trial of Trelegy Ellipta  >>> 1 puff daily in the morning >>>rinse mouth out after use  >>> This inhaler contains 3 medications that help manage her respiratory status, contact our office if you cannot afford this medication or unable to remain on this medication >>>sample provided today >>>This medication replaces your Breo Ellipta and your Incruse Ellipta >>> Call us in 7 days let us know how you are doing on this medication  >>> Discuss the $167 bill for Trelegy Ellipta with your pharmacy, I believe this is most likely related to you having a deductible with your insurance plan.  If not please contact our office so that way I can inquire with the Trelegy Ellipta drug rep so why your coverage is $167 for Trelegy Ellipta but $45 for Brio Ellipta and $45 for Incruse Ellipta   Note your daily symptoms > remember "red flags" for COPD:   >>>Increase in cough >>>increase in sputum production >>>increase in shortness of breath or activity  intolerance.   If you notice these symptoms, please call the office to be seen.   Follow-up with our office in 2 to 4 weeks with [name], NP or [name]  It is flu season:   >>>Remember to be washing your hands regularly, using hand sanitizer, be careful to use around herself with has contact with people who are sick will increase her chances of getting sick yourself. >>> Best ways to protect herself from the flu: Receive the yearly flu vaccine, practice good hand hygiene washing with soap and also using hand sanitizer when available, eat a nutritious meals, get adequate rest, hydrate appropriately   Please contact the office if your symptoms worsen or you have concerns that you are not improving.   Thank you for choosing [facility] for your healthcare, and for allowing  [place] to partner with you on your healthcare journey. I am thankful to be able to provide care to you today.   [name] FNP-C   Chronic Obstructive Pulmonary Disease Exacerbation Chronic obstructive pulmonary disease (COPD) is a long-term (chronic) lung problem. In COPD, the flow of air from the lungs is limited. COPD exacerbations are times that breathing gets worse and you need more than your normal treatment. Without treatment, they can be life threatening. If they happen often, your lungs can become more damaged. If your COPD gets worse, your doctor may treat you with:  Medicines.  Oxygen.  Different ways to clear your airway, such as using a mask. Follow these instructions at home: Medicines  Take over-the-counter and prescription medicines only as told by your doctor.  If you take an antibiotic or steroid medicine, do not stop taking the medicine even if you start to feel better.  Keep up with shots (vaccinations) as told by your doctor. Be sure to get a yearly (annual) flu shot. Lifestyle  Do not smoke. If you need help quitting, ask your doctor.  Eat healthy foods.  Exercise regularly.  Get plenty of sleep.  Avoid tobacco smoke and other things that can bother your lungs.  Wash your hands often with soap and water. This will help keep you from getting an infection. If you cannot use soap and water, use hand sanitizer.  During flu season, avoid areas that are crowded with people. General instructions  Drink enough fluid to keep your pee (urine) clear or pale yellow. Do not do this if your doctor has told you not to.  Use a cool mist machine (vaporizer).  If you use oxygen or a machine that turns medicine into a mist (nebulizer), continue to use it as told.  Follow all instructions for rehabilitation. These are steps you can take to make your body work better.  Keep all follow-up visits as told by your doctor. This is important. Contact a doctor if:  Your COPD  symptoms get worse than normal. Get help right away if:  You are short of breath and it gets worse.  You have trouble talking.  You have chest pain.  You cough up blood.  You have a fever.  You keep throwing up (vomiting).  You feel weak or you pass out (faint).  You feel confused.  You are not able to sleep because of your symptoms.  You are not able to do daily activities. Summary  COPD exacerbations are times that breathing gets worse and you need more treatment than normal.  COPD exacerbations can be very serious and may cause your lungs to become more damaged.  Do not smoke. If you need help quitting, ask your doctor.  Stay up-to-date on your shots. Get a flu shot every year. This information is not intended to replace advice given to you by your health care provider. Make sure you discuss any questions you have with your health care provider. Document Released: 01/30/2011 Document Revised: 03/17/2016 Document Reviewed: 03/17/2016 Elsevier Interactive Patient Education  2019 [employer].

## 2018-03-02 ENCOUNTER — Telehealth: Payer: Self-pay | Admitting: Pulmonary Disease

## 2018-03-02 NOTE — Telephone Encounter (Signed)
Called and spoke with patient, this has been resolved. Nothing further needed.

## 2018-03-02 NOTE — Telephone Encounter (Signed)
Called and spoke with patient, she needed to know how to take her prednisone. Instructions given from AVS below. Nothing further needed.    Take the 40 mg of prednisone daily for 5 days that the emergency room provided for you >>> Take with food >>> Take in the morning

## 2018-03-02 NOTE — Telephone Encounter (Signed)
Patient returned call, CB is [phone] or [phone].

## 2018-03-02 NOTE — Telephone Encounter (Signed)
Attempted to call Patient on home number. Phone rings and rings, then stops.  Called her mobile number, left message to call back.

## 2018-03-05 ENCOUNTER — Telehealth: Payer: Self-pay | Admitting: Pulmonary Disease

## 2018-03-05 NOTE — Telephone Encounter (Signed)
03/05/2018 1609  Attempted to call the patient as well as patient's DPR to see how she was doing.  Patient was previously in the emergency room over the weekend and was seen earlier this week.  Checking to see if the prednisone the patient was prescribed had helped at all with her breathing.  Left message for patient to call back  [name], NP

## 2018-03-08 MED ORDER — FLUTICASONE-UMECLIDIN-VILANT 100-62.5-25 MCG/INH IN AEPB: 1.0000 | INHALATION_SPRAY | Freq: Every day | RESPIRATORY_TRACT | 3 refills | Status: DC

## 2018-03-08 NOTE — Telephone Encounter (Signed)
Pt is calling back  [phone]

## 2018-03-08 NOTE — Telephone Encounter (Signed)
Pt is calling back [phone]

## 2018-03-08 NOTE — Telephone Encounter (Signed)
Thanks for the update. Agreed.   Aaron Edelman

## 2018-03-08 NOTE — Telephone Encounter (Signed)
Called and spoke with Patient.  She stated that the Trelegy is working very well for her, and she is requesting a prescription to be sent to [facility], [address]  Asked about Prednisone, and she thinks it has helped with her breathing, but she can still hear a little wheeze at times.  Patient stated she is to follow up with [name], NP, at 03/15/18. Trelegy prescription sent to preferred pharmacy.  Nothing further at this time.

## 2018-03-14 NOTE — Progress Notes (Signed)
@Patient  ID: Shelby Lin, female    DOB: 1940/09/12, 78 y.o.   MRN: [id number]  Chief Complaint  Patient presents with  . Follow-up    2 week COPD follow up     Referring provider: Carol Ada, MD  HPI:  78 year old female former smoker followed in our office for gold 3 COPD  Smoker/ Smoking History: Former smoker.  Quit 2002.  21-pack-year smoking history. Maintenance: Trelegy Ellipta Pt of: [name]  03/15/2018  - Visit   78 year old female former smoker presenting to our office today for 2-week follow-up from a COPD exacerbation.  Patient has completed azithromycin as well as prednisone.  mMRC is significantly improved mMRC 2 from [facility] 4 at last office visit.  Patient is also maintained on Trelegy Ellipta.  She prefers Trelegy Ellipta to [employer].  Patient reports that she still does have occasional bouts of shortness of breath with physical exertion.  Patient's previous pulmonary function test shows an FEV1 of 0.96.  Patient does not have to use her rescue inhaler often.  Patient is up-to-date with her flu and pneumonia vaccines.  Patient does report that occasionally when she wakes up in the morning she checks her oxygen levels and her oxygen levels are in the 70s.  Patient checks her oxygen levels at home and oxygenation usually is above 90%.  Patient has never completed an overnight oximetry test.  MMRC - Breathlessness Score 2 - on level ground, I walk slower than people of the same age because of breathlessness, or have to stop for breathe when walking to my own pace   Tests:   02/28/2018-chest x-ray- COPD changes without acute infiltrate, emphysematous and central bronchitic changes consistent with COPD  01/28/2018-CT chest without contrast-right middle lobe small solid pulmonary nodules are stable decreasing comparison at 02/11/2017 chest CT, considered benign, stable minimal scattered cylindrical bronchiectasis medial upper lobes and medial  right middle lobe  02/11/2017-CT chest high-res- minimal scattered cylindrical bronchiectasis with associated minimal tree-in-bud opacity in the medial upper lobes and medial right middle lobes, findings could be consistent of MAI >>>Mild to moderate centrilobular emphysema with mild diffuse bronchial wall thickening, 2 small solid pulmonary nodules in right lung largest with average diameter 5 mm in the right middle lobe  04/01/2017-pulmonary function test- FVC 1.69 (61% predicted), postbronchodilator ratio 48, postbronchodilator FEV1 46, positive bronchodilator response, mid flow reversibility after bronchodilator, DLCO 52 >>>Severe obstructive airways disease >>>Patient did take Incruse 1 puff at 8 AM prior to testing  FENO:  No results found for: NITRICOXIDE  PFT: PFT Results Latest Ref Rng & Units 04/01/2017  FVC-Pre L 1.69  FVC-Predicted Pre % 61  FVC-Post L 2.02  FVC-Predicted Post % 73  Pre FEV1/FVC % % 45  Post FEV1/FCV % % 48  FEV1-Pre L 0.76  FEV1-Predicted Pre % 36  FEV1-Post L 0.96  DLCO UNC% % 52  DLCO COR %Predicted % 84    Imaging: Dg Chest 2 View  Result Date: 02/28/2018 CLINICAL DATA:  Shortness of breath, cough, cold symptoms for 1 week, history COPD, coronary artery disease, hypertension EXAM: CHEST - 2 VIEW COMPARISON:  03/23/2008 Correlation CT chest 01/28/2018 FINDINGS: Normal heart size, mediastinal contours, and pulmonary vascularity. Atherosclerotic calcification aorta. Emphysematous and central bronchitic changes consistent with COPD. No acute infiltrate, pleural effusion, or pneumothorax. Bones demineralized. IMPRESSION: COPD changes without acute infiltrate. Electronically Signed   By: Lavonia Dana M.D.   On: 02/28/2018 17:46      Specialty  Problems      Pulmonary Problems   Stage 3 severe COPD by GOLD classification ([place])    02/11/2017-CT chest high-res- minimal scattered cylindrical bronchiectasis with associated minimal tree-in-bud opacity in the  medial upper lobes and medial right middle lobes, findings could be consistent of MAI >>>Mild to moderate centrilobular emphysema with mild diffuse bronchial wall thickening, 2 small solid pulmonary nodules in right lung largest with average diameter 5 mm in the right middle lobe  04/01/2017-pulmonary function test- FVC 1.69 (61% predicted), postbronchodilator ratio 48, postbronchodilator FEV1 46, positive bronchodilator response, mid flow reversibility after bronchodilator, DLCO 52 >>>Severe obstructive airways disease >>>Patient did take Incruse 1 puff at 8 AM prior to testing  02/28/2018-chest x-ray- COPD changes without acute infiltrate, emphysematous and central bronchitic changes consistent with COPD  02/28/2018 - ED - COPD exacerbation          Allergies  Allergen Reactions  . Acyclovir And Related   . Amoxicillin Hives  . Codeine Nausea And Vomiting  . Lorabid [Loracarbef] Hives  . Sulfa Antibiotics Hives    Immunization History  Administered Date(s) Administered  . Influenza, High Dose Seasonal PF 12/12/2016, 11/27/2017  . Pneumococcal Conjugate-13 10/16/2017   Needs Pneumovax 23 -August/2020  Past Medical History:  Diagnosis Date  . Arthritis   . Combined hyperlipidemia   . COPD (chronic obstructive pulmonary disease) ([place])   . Coronary artery spasm ([place])   . Dupuytren's contracture   . Dyspepsia   . Familial tremor   . Fatigue   . Fibromyalgia syndrome   . GERD (gastroesophageal reflux disease)   . Hypertension   . Hypothyroidism, acquired, autoimmune   . Multinodular goiter (nontoxic)   . Tachycardia   . Thyroiditis, autoimmune   . Wears glasses     Tobacco History: Social History   Tobacco Use  Smoking Status Former Smoker  . Packs/day: 0.50  . Years: 42.00  . Pack years: 21.00  . Types: Cigarettes  . Last attempt to quit: 02/21/2001  . Years since quitting: 17.0  Smokeless Tobacco Never Used   Counseling given: Yes  Continue to not  smoke  Outpatient Encounter Medications as of 03/15/2018  Medication Sig  . albuterol (PROVENTIL HFA;VENTOLIN HFA) 108 (90 Base) MCG/ACT inhaler Inhale 1-2 puffs into the lungs every 6 (six) hours as needed for wheezing or shortness of breath.  . Cyanocobalamin (VITAMIN B 12 PO) Take by mouth daily.  . DULoxetine (CYMBALTA) 60 MG capsule Take 30 mg by mouth daily. Take 3 tablets at one time to make 90 mg  . Fluticasone-Umeclidin-Vilant (TRELEGY ELLIPTA) 100-62.5-25 MCG/INH AEPB Inhale 1 puff into the lungs daily.  Marland Kitchen losartan-hydrochlorothiazide (HYZAAR) 100-25 MG tablet Take 1 tablet by mouth daily.  . Multiple Vitamin (MULTIVITAMIN) tablet Take 1 tablet by mouth daily.    Marland Kitchen omeprazole (PRILOSEC) 20 MG capsule Take 20 mg by mouth daily.  . rosuvastatin (CRESTOR) 5 MG tablet Take 5 mg by mouth daily.  Marland Kitchen SYNTHROID 88 MCG tablet TAKE 1 TABLET (88 MCG TOTAL) BY MOUTH DAILY BEFORE BREAKFAST.  Marland Kitchen VITAMIN D, CHOLECALCIFEROL, PO Take 50,000 Units by mouth once a week.   . [DISCONTINUED] fluticasone furoate-vilanterol (BREO ELLIPTA) 100-25 MCG/INH AEPB Inhale 1 puff into the lungs daily.  . [DISCONTINUED] umeclidinium bromide (INCRUSE ELLIPTA) 62.5 MCG/INH AEPB Inhale 1 puff into the lungs daily.   No facility-administered encounter medications on file as of 03/15/2018.      Review of Systems  Review of Systems  Constitutional: Positive for fatigue.  Negative for chills and fever.  HENT: Negative for congestion, sinus pressure and sinus pain.   Respiratory: Positive for shortness of breath. Negative for wheezing.   Cardiovascular: Negative for chest pain and palpitations.  Gastrointestinal: Negative for diarrhea, nausea and vomiting.    STOP BANG questionnaire  Snoring? yes Tiredness? Not really  Observed apneas? No  Elevated blood pressure? BMI greater than 35? No  Age greater than 46? Yes  Neck circumference greater than 40 cm? No  Female gender? No   Scoring:  One-point is signed for  each.   0-2 equals low risk, 3-4 equals intermediate risk, those with 5 or greater high risk for having obstructive sleep apnea  Score: 2   Physical Exam  BP (!) 102/58 (BP Location: Left Arm, Cuff Size: Normal)   Pulse (!) 101   Ht 5\' 5"  (1.651 m)   Wt 157 lb 6.4 oz (71.4 kg)   SpO2 93%   BMI 26.19 kg/m   Wt Readings from Last 5 Encounters:  03/15/18 157 lb 6.4 oz (71.4 kg)  03/01/18 158 lb 3.2 oz (71.8 kg)  02/28/18 162 lb (73.5 kg)  02/01/18 158 lb (71.7 kg)  10/16/17 157 lb 3.2 oz (71.3 kg)   Room air  Physical Exam  Constitutional: She is oriented to person, place, and time and well-developed, well-nourished, and in no distress. No distress.  HENT:  Head: Normocephalic and atraumatic.  Right Ear: Hearing, tympanic membrane, external ear and ear canal normal.  Left Ear: Hearing, tympanic membrane, external ear and ear canal normal.  Nose: Nose normal. Right sinus exhibits no maxillary sinus tenderness and no frontal sinus tenderness. Left sinus exhibits no maxillary sinus tenderness and no frontal sinus tenderness.  Mouth/Throat: Uvula is midline and oropharynx is clear and moist. Normal dentition. No dental caries. No oropharyngeal exudate.  Eyes: Pupils are equal, round, and reactive to light.  Neck: Normal range of motion. Neck supple.  Cardiovascular: Normal rate, regular rhythm and normal heart sounds.  Pulmonary/Chest: Effort normal and breath sounds normal. No accessory muscle usage. No respiratory distress. She has no decreased breath sounds. She has no wheezes. She has no rhonchi. She has no rales.  Musculoskeletal: Normal range of motion.        General: No edema.  Lymphadenopathy:    She has no cervical adenopathy.  Neurological: She is alert and oriented to person, place, and time. Gait normal.  Skin: Skin is warm and dry. She is not diaphoretic. No erythema.  Psychiatric: Mood, memory, affect and judgment normal.  Nursing note and vitals reviewed.      Lab Results:  CBC    Component Value Date/Time   WBC 14.0 (H) 02/28/2018 1732   RBC 3.93 02/28/2018 1732   HGB 11.6 (L) 02/28/2018 1732   HCT 37.0 02/28/2018 1732   PLT 382 02/28/2018 1732   MCV 94.1 02/28/2018 1732   MCH 29.5 02/28/2018 1732   MCHC 31.4 02/28/2018 1732   RDW 12.3 02/28/2018 1732   LYMPHSABS 2.9 02/28/2018 1732   MONOABS 1.2 (H) 02/28/2018 1732   EOSABS 0.3 02/28/2018 1732   BASOSABS 0.1 02/28/2018 1732    BMET    Component Value Date/Time   NA 136 02/28/2018 1732   K 3.5 02/28/2018 1732   CL 98 02/28/2018 1732   CO2 29 02/28/2018 1732   GLUCOSE 133 (H) 02/28/2018 1732   BUN 12 02/28/2018 1732   CREATININE 0.63 02/28/2018 1732   CREATININE 0.59 (L) 02/22/2016 0001  CALCIUM 9.0 02/28/2018 1732   CALCIUM 9.7 04/06/2012 1019   GFRNONAA >60 02/28/2018 1732   GFRAA >60 02/28/2018 1732    BNP    Component Value Date/Time   BNP 25.5 02/28/2018 1732    ProBNP No results found for: PROBNP    Assessment & Plan:    Stage 3 severe COPD by GOLD classification ([place]) Assessment: mMRC 2 Significant improvement lung sounds Lungs clear to auscultation with no wheezing Patient reporting clinical improvement Patient reporting occasional early morning hypoxemia  Plan: Continue Trelegy Ellipta inhaler Overnight oximetry test ordered Walk today in office on room air -patient completed 3 laps with no desaturations Follow-up with our office in 6 to 8 weeks Needs Pneumovax 23 in [place], NP 03/15/2018   This appointment was 32 min long with over 50% of the time in direct face-to-face patient care, assessment, plan of care, and follow-up.

## 2018-03-14 NOTE — Patient Instructions (Addendum)
Walk in office today   Overnight oximetry test ordered   Continue Trelegy Ellipta  >>> 1 puff daily in the morning >>>rinse mouth out after use  >>> This inhaler contains 3 medications that help manage her respiratory status, contact our office if you cannot afford this medication or unable to remain on this medication  Note your daily symptoms > remember "red flags" for COPD:   >>>Increase in cough >>>increase in sputum production >>>increase in shortness of breath or activity  intolerance.   If you notice these symptoms, please call the office to be seen.   Follow up in 6-8 weeks or sooner if symptoms do not improve   It is flu season:   >>>Remember to be washing your hands regularly, using hand sanitizer, be careful to use around herself with has contact with people who are sick will increase her chances of getting sick yourself. >>> Best ways to protect herself from the flu: Receive the yearly flu vaccine, practice good hand hygiene washing with soap and also using hand sanitizer when available, eat a nutritious meals, get adequate rest, hydrate appropriately   Please contact the office if your symptoms worsen or you have concerns that you are not improving.   Thank you for choosing  [facility] for your healthcare, and for allowing [place] to partner with you on your healthcare journey. I am thankful to be able to provide care to you today.   [name] FNP-C    Chronic Obstructive Pulmonary Disease Chronic obstructive pulmonary disease (COPD) is a long-term (chronic) lung problem. When you have COPD, it is hard for air to get in and out of your lungs. Usually the condition gets worse over time, and your lungs will never return to normal. There are things you can do to keep yourself as healthy as possible.  Your doctor may treat your condition with: ? Medicines. ? Oxygen. ? Lung surgery.  Your doctor may also recommend: ? Rehabilitation. This includes steps to make  your body work better. It may involve a team of specialists. ? Quitting smoking, if you smoke. ? Exercise and changes to your diet. ? Comfort measures (palliative care). Follow these instructions at home: Medicines  Take over-the-counter and prescription medicines only as told by your doctor.  Talk to your doctor before taking any cough or allergy medicines. You may need to avoid medicines that cause your lungs to be dry. Lifestyle  If you smoke, stop. Smoking makes the problem worse. If you need help quitting, ask your doctor.  Avoid being around things that make your breathing worse. This may include smoke, chemicals, and fumes.  Stay active, but remember to rest as well.  Learn and use tips on how to relax.  Make sure you get enough sleep. Most adults need at least 7 hours of sleep every night.  Eat healthy foods. Eat smaller meals more often. Rest before meals. Controlled breathing Learn and use tips on how to control your breathing as told by your doctor. Try:  Breathing in (inhaling) through your nose for 1 second. Then, pucker your lips and breath out (exhale) through your lips for 2 seconds.  Putting one hand on your belly (abdomen). Breathe in slowly through your nose for 1 second. Your hand on your belly should move out. Pucker your lips and breathe out slowly through your lips. Your hand on your belly should move in as you breathe out.  Controlled coughing Learn and use controlled coughing to clear mucus from  your lungs. Follow these steps: 1. Lean your head a little forward. 2. Breathe in deeply. 3. Try to hold your breath for 3 seconds. 4. Keep your mouth slightly open while coughing 2 times. 5. Spit any mucus out into a tissue. 6. Rest and do the steps again 1 or 2 times as needed. General instructions  Make sure you get all the shots (vaccines) that your doctor recommends. Ask your doctor about a flu shot and a pneumonia shot.  Use oxygen therapy and pulmonary  rehabilitation if told by your doctor. If you need home oxygen therapy, ask your doctor if you should buy a tool to measure your oxygen level (oximeter).  Make a COPD action plan with your doctor. This helps you to know what to do if you feel worse than usual.  Manage any other conditions you have as told by your doctor.  Avoid going outside when it is very hot, cold, or humid.  Avoid people who have a sickness you can catch (contagious).  Keep all follow-up visits as told by your doctor. This is important. Contact a doctor if:  You cough up more mucus than usual.  There is a change in the color or thickness of the mucus.  It is harder to breathe than usual.  Your breathing is faster than usual.  You have trouble sleeping.  You need to use your medicines more often than usual.  You have trouble doing your normal activities such as getting dressed or walking around the house. Get help right away if:  You have shortness of breath while resting.  You have shortness of breath that stops you from: ? Being able to talk. ? Doing normal activities.  Your chest hurts for longer than 5 minutes.  Your skin color is more blue than usual.  Your pulse oximeter shows that you have low oxygen for longer than 5 minutes.  You have a fever.  You feel too tired to breathe normally. Summary  Chronic obstructive pulmonary disease (COPD) is a long-term lung problem.  The way your lungs work will never return to normal. Usually the condition gets worse over time. There are things you can do to keep yourself as healthy as possible.  Take over-the-counter and prescription medicines only as told by your doctor.  If you smoke, stop. Smoking makes the problem worse. This information is not intended to replace advice given to you by your health care provider. Make sure you discuss any questions you have with your health care provider. Document Released: 07/30/2007 Document Revised: 03/17/2016  Document Reviewed: 03/17/2016 Elsevier Interactive Patient Education  2019 [employer].

## 2018-03-15 ENCOUNTER — Encounter: Payer: Self-pay | Admitting: Pulmonary Disease

## 2018-03-15 ENCOUNTER — Ambulatory Visit (INDEPENDENT_AMBULATORY_CARE_PROVIDER_SITE_OTHER): Payer: Medicare Other | Admitting: Pulmonary Disease

## 2018-03-15 VITALS — BP 102/58 | HR 101 | Ht 65.0 in | Wt 157.4 lb

## 2018-03-15 DIAGNOSIS — J449 Chronic obstructive pulmonary disease, unspecified: Secondary | ICD-10-CM | POA: Diagnosis not present

## 2018-03-15 NOTE — Assessment & Plan Note (Signed)
Assessment: mMRC 2 Significant improvement lung sounds Lungs clear to auscultation with no wheezing Patient reporting clinical improvement Patient reporting occasional early morning hypoxemia  Plan: Continue Trelegy Ellipta inhaler Overnight oximetry test ordered Walk today in office on room air -patient completed 3 laps with no desaturations Follow-up with our office in 6 to 8 weeks Needs Pneumovax 23 in July/2020

## 2018-03-18 ENCOUNTER — Telehealth: Payer: Self-pay | Admitting: Pulmonary Disease

## 2018-03-18 NOTE — Telephone Encounter (Signed)
Forwarding to [facility] per protocol since order already placed for ONO

## 2018-03-18 NOTE — Telephone Encounter (Signed)
I spoke to [place] who advised pt will be getting contacted very soon for the ONO.  They are just waiting for the return of one of their machines.  As soon as it gets returned, they will call the pt.    I spoke to the patient & let her know.  Pt verbalized understanding & stated nothing further needed at this time.

## 2018-03-29 ENCOUNTER — Telehealth: Payer: Self-pay | Admitting: Pulmonary Disease

## 2018-03-29 NOTE — Telephone Encounter (Signed)
Called and spoke to pt.  Pt states she has not received a call regarding ONO.  Per our records, order was sent to [facility] on 03/15/18.  PCC's can you help with this?

## 2018-03-29 NOTE — Telephone Encounter (Signed)
Had to leave vm with [facility] for someone to call me back regarding status of ono.

## 2018-03-30 NOTE — Telephone Encounter (Signed)
Spoke to [employer] at [facility].  She states she spoke to pt yesterday afternoon around 3:20.  The reason they had not contacted her was due to machine was not available.  Pt is going there this morning around 10 or 11 to pick up machine.  Nothing further needed.

## 2018-03-30 NOTE — Telephone Encounter (Signed)
Called [facility] and the office is currently closed. Office hours start at 8:30.

## 2018-04-01 ENCOUNTER — Telehealth: Payer: Self-pay | Admitting: Pulmonary Disease

## 2018-04-01 ENCOUNTER — Telehealth: Payer: Self-pay

## 2018-04-01 NOTE — Telephone Encounter (Signed)
04/01/2018 1308  Please contact the patient and let them know that we have the results of her overnight oximetry.  03/30/2018-overnight oximetry- less than 88% for 43 minutes and 43 seconds, less than 89% for an hour and 36 minutes of 51 seconds, baseline SPO2 of 91%  Based off these results the patient does qualify for oxygen at night.  I would recommend starting 2 L of oxygen at night for maintenance.  Please place the order in the DME of patient's choice if they are willing to proceed forward with this.  [name], FNP

## 2018-04-01 NOTE — Telephone Encounter (Signed)
Referral notes received from [facility] at [place], Dr Carol Ada office P: [phone] F: [phone]. Notes sent to scheduling.

## 2018-04-01 NOTE — Telephone Encounter (Addendum)
lmom of [name]'s(DPR) number per BM to call back about mom's ONO.

## 2018-04-02 ENCOUNTER — Telehealth: Payer: Self-pay | Admitting: Pulmonary Disease

## 2018-04-02 DIAGNOSIS — J449 Chronic obstructive pulmonary disease, unspecified: Secondary | ICD-10-CM

## 2018-04-02 NOTE — Telephone Encounter (Signed)
Called and spoke with Patient. Shelby Quaker, NP recommendations given.  Understanding stated. DME order placed. Nothing further at this time.   Per - Warner Mccreedy, Vinson Moselle, NP   1:10 PM  Note    04/01/2018 1308  Please contact the patient and let them know that we have the results of her overnight oximetry.  03/30/2018-overnight oximetry- less than 88% for 43 minutes and 43 seconds, less than 89% for an hour and 36 minutes of 51 seconds, baseline SPO2 of 91%  Based off these results the patient does qualify for oxygen at night.  I would recommend starting 2 L of oxygen at night for maintenance.  Please place the order in the DME of patient's choice if they are willing to proceed forward with this.  Shelby Quaker, FNP

## 2018-04-02 NOTE — Telephone Encounter (Signed)
lmtcb x1 [name] (DPR).  Pt's daughter.

## 2018-04-02 NOTE — Telephone Encounter (Signed)
Spoke with Jackelyn Poling and relayed info.  She requested I call pt and ler her know results also.  Will call pt.  [name] stated to proceed with order for nocturnal oxygen.

## 2018-04-02 NOTE — Telephone Encounter (Signed)
lmtcb x1 pt on mobile number.  House number was a fax.

## 2018-04-07 NOTE — Telephone Encounter (Signed)
Error

## 2018-04-09 ENCOUNTER — Ambulatory Visit (INDEPENDENT_AMBULATORY_CARE_PROVIDER_SITE_OTHER): Payer: Medicare Other | Admitting: "Endocrinology

## 2018-04-09 ENCOUNTER — Encounter (INDEPENDENT_AMBULATORY_CARE_PROVIDER_SITE_OTHER): Payer: Self-pay | Admitting: "Endocrinology

## 2018-04-09 VITALS — BP 132/72 | HR 88 | Wt 159.6 lb

## 2018-04-09 DIAGNOSIS — E663 Overweight: Secondary | ICD-10-CM

## 2018-04-09 DIAGNOSIS — E049 Nontoxic goiter, unspecified: Secondary | ICD-10-CM | POA: Diagnosis not present

## 2018-04-09 DIAGNOSIS — M797 Fibromyalgia: Secondary | ICD-10-CM

## 2018-04-09 DIAGNOSIS — R1013 Epigastric pain: Secondary | ICD-10-CM

## 2018-04-09 DIAGNOSIS — E559 Vitamin D deficiency, unspecified: Secondary | ICD-10-CM | POA: Diagnosis not present

## 2018-04-09 DIAGNOSIS — E063 Autoimmune thyroiditis: Secondary | ICD-10-CM | POA: Diagnosis not present

## 2018-04-09 DIAGNOSIS — R7303 Prediabetes: Secondary | ICD-10-CM

## 2018-04-09 DIAGNOSIS — I10 Essential (primary) hypertension: Secondary | ICD-10-CM | POA: Diagnosis not present

## 2018-04-09 DIAGNOSIS — R5383 Other fatigue: Secondary | ICD-10-CM

## 2018-04-09 LAB — POCT GLYCOSYLATED HEMOGLOBIN (HGB A1C): Hemoglobin A1C: 6.4 % — AB (ref 4.0–5.6)

## 2018-04-09 LAB — POCT GLUCOSE (DEVICE FOR HOME USE): POC Glucose: 115 mg/dl — AB (ref 70–99)

## 2018-04-09 LAB — TSH: TSH: 1.02 mIU/L (ref 0.40–4.50)

## 2018-04-09 LAB — VITAMIN D 25 HYDROXY (VIT D DEFICIENCY, FRACTURES): Vit D, 25-Hydroxy: 45 ng/mL (ref 30–100)

## 2018-04-09 LAB — T4, FREE: Free T4: 1.4 ng/dL (ref 0.8–1.8)

## 2018-04-09 LAB — T3, FREE: T3, Free: 2.4 pg/mL (ref 2.3–4.2)

## 2018-04-09 LAB — PTH, INTACT AND CALCIUM
CALCIUM: 9.5 mg/dL (ref 8.6–10.4)
PTH: 16 pg/mL (ref 14–64)

## 2018-04-09 NOTE — Progress Notes (Signed)
Subjective:  Patient Name: Shelby Lin Date of Birth: 1940/04/19  MRN: [id number]  Shelby Lin  presents to the office today for follow-up of her hypothyroidism secondary to Hashimoto's disease, questionable nodular goiter, tachycardia, tremor, dyspepsia, fatigue, fibromyalgia, hyperlipidemia, night sweats, tremor, and pre-diabetes.  HISTORY OF PRESENT ILLNESS:   Shelby Lin is a 78 y.o. Caucasian woman.  Shelby Lin was unaccompanied.  1. Shelby Lin was referred to me on 03/25/2005 by her primary care provider, Dr. Carol Ada, for evaluation of her thyroid problems. [name] was 23 at that time.   A. At about age 26 the patient developed thyroid problems and was put on a thyroid medicine. Later when she became pregnant her obstetrician took her off that medicine. At about age 53, she developed severe fatigue and weight gain. She was put back on Synthroid. Her dose of Synthroid was 88 mcg per day for many years. In January 2006, she had fluctuations in her thyroid function tests. Synthroid was increased to 100 mcg and later to 125 mcg per day. The Synthroid dose was then reduced to 75 mcg. After blood tests about 9 months prior to her first appointment with me, the patient was put back on a dose of 88 mcg per day. When the patient was having fluctuations in her thyroid tests, her thyroid gland became visibly enlarged and felt full and tender to palpation. She was also more hoarse during those periods. Hoarseness came and went in parallel with the thyroid gland swelling. Ultrasound studies of the thyroid in January 2006 and January 2007 showed a multinodular goiter.   B. Thyroid function tests performed at that first visit showed a TSH of 4.139, free T4 1.22, and free T3 of 2.7. Because any TSH at that time greater than 3.0 was considered to be elevated physiologically, I increased her Synthroid from 88 to 100 mcg per day. TPO antibody was markedly positive at 954.0, c/w the diagnosis of Hashimoto's  thyroiditis. Her TSI level was quite normal at 0.9.   2. During the last 12 years the patient has had several medical issues that we have followed:  A. She has had several flare-ups of Hashimoto's disease, resulting in several changes in her doses of Synthroid. Her Synthroid dose was subsequently decreased to 88 mcg/day in January 2018.   B. Although she was initially thought to have a multinodular goiter, over time it became clear that all but one of the nodules were actually areas of echotexture heterogeneity. She has had one "nodularish" area of her left inferior pole that has remained unchanged since 2011. This area is ill-defined and may also just represent several overlapping areas of heterogeneity.  C. She has also had problems with fibromyalgia, arthritis, hypertension, depression, fatigue, tremor, hyperlipidemia, reflux, dyspepsia, and vitamin D deficiency.   D. In August 2016 her HbA1c was 6.2%, c/w prediabetes. Since hen her HbA1c values have varied from 5.8% to 6.4%. The patient refused to take metformin because three of her relatives lost their scalp hair when they took metformin.    3. The patient's last [facility] visit was on 10/07/17. At that visit I continued her Synthroid, MVI, and Biotech. In the interim she has been healthy, but her COPD is much worse and she has been very tired. Her allergies have been "okay".     A. In early January she developed a severe exacerbation of her COPD was worse. She had to be treated with erythromycin and several rounds of steroids. Her COPD is somewhat better now  She was noted to have a pulmonary nodule. She also had decreased oxygen saturations during the night, so she now uses supplemental oxygen during the night.   B. Her tension headaches are about the same. Cervical stretching helps when she does her cervical stretching exercises regularly.   C. She has a lot of fatigue. Her energy has been low. She says that she sleeps well. Her husband has not been  complaining about her snoring and she has not awakened gasping for breath.     D. She still has hot spells or sweats occasionally, but not often.   E. Her depression is "OK".  Her family problems are unchanged.      F. She continues to have problems with fibromyalgia and arthritis in her wrists, hands, knees, and feet, worse when it rains.   G. Her feet still bother her a lot. Her feet get red and feel hot in the forefoot area a lot, especially when the weather is warm. .    H. Her Meniere's disease has not been very active. She does not have ringing and roaring in her ears very often. .   I. She remains on her Protonix daily. She still takes Synthroid, 88 mcg/day. She also takes generic Crestor 5 mg/day, an MVI, and Biotech vitamin D, 50,000 IU/weekly.   J. She is intermittently hoarse. The hoarseness still comes and goes in association with her allergies.   K. She rarely has sweet tea anymore.    4. Pertinent Review of Systems:  Constitutional: "I feel pretty good."  Her stamina and energy are worse. The SOB is worse if she is outside in the heat or if she is in a hurry and gets anxious.  Eyes: Vision is good as long as she wears her glasses. She had an eye exam in August 2019. There were no significant eye problems noted.    Neck: The thyroid gland has not seemed to swell lately. The patient has no complaints of anterior neck soreness, tenderness,  pressure, discomfort, or difficulty swallowing.  Heart: She has not had any other chest symptoms. Heart rate increases with exercise or other physical activity. The patient has no other complaints of palpitations, irregular heat beats, chest pain, or chest pressure. Gastrointestinal: Protonix is usually successful in controlling her acid reflux, indigestion, and dyspepsia. Bowel movements are normal.  Legs: Muscle mass and strength seem normal. Legs are stronger when she walks more. She no longer has episodic leg numbness and burning. She has had some  edema occasionally. Feet: Her feet still burn in the forefeet at times.    GYN: She still gets hot flashes, but the intensity of the hot flashes varies over time.   Emotional/psychiatric: She feels "okay".    Mental: She does "OK" at thinking, paying attention, remembering, and making decisions.   PAST MEDICAL, FAMILY, AND SOCIAL HISTORY:  Past Medical History:  Diagnosis Date  . Arthritis   . Combined hyperlipidemia   . COPD (chronic obstructive pulmonary disease) ([place])   . Coronary artery spasm ([place])   . Dupuytren's contracture   . Dyspepsia   . Familial tremor   . Fatigue   . Fibromyalgia syndrome   . GERD (gastroesophageal reflux disease)   . Hypertension   . Hypothyroidism, acquired, autoimmune   . Multinodular goiter (nontoxic)   . Tachycardia   . Thyroiditis, autoimmune   . Wears glasses     Family History  Problem Relation Age of Onset  . Cancer Mother   .  Hypertension Mother   . Stroke Mother   . Heart attack Father   . Sudden death Father   . Hypertension Sister   . Diabetes Sister   . Stroke Sister   . Thyroid disease Neg Hx      Current Outpatient Medications:  .  albuterol (PROVENTIL HFA;VENTOLIN HFA) 108 (90 Base) MCG/ACT inhaler, Inhale 1-2 puffs into the lungs every 6 (six) hours as needed for wheezing or shortness of breath., Disp: 1 Inhaler, Rfl: 0 .  Cyanocobalamin (VITAMIN B 12 PO), Take by mouth daily., Disp: , Rfl:  .  DULoxetine (CYMBALTA) 30 MG capsule, TAKE 3 CAPSULES BY MOUTH ONCE DAILY ONCE A DAY ORALLY 30, Disp: , Rfl:  .  Fluticasone-Umeclidin-Vilant (TRELEGY ELLIPTA) 100-62.5-25 MCG/INH AEPB, Inhale 1 puff into the lungs daily., Disp: 60 each, Rfl: 3 .  furosemide (LASIX) 20 MG tablet, TAKE 1 TABLET BY MOUTH EVERY DAY AS NEEDED FOR SWELLING, Disp: , Rfl: 4 .  hydrochlorothiazide (HYDRODIURIL) 25 MG tablet, TAKE 1 TABLET BY MOUTH EVERY DAY IN THE MORNING, Disp: , Rfl: 1 .  losartan (COZAAR) 100 MG tablet, Take 100 mg by mouth daily., Disp:  , Rfl: 1 .  losartan-hydrochlorothiazide (HYZAAR) 100-25 MG tablet, Take 1 tablet by mouth daily., Disp: , Rfl:  .  Multiple Vitamin (MULTIVITAMIN) tablet, Take 1 tablet by mouth daily.  , Disp: , Rfl:  .  omeprazole (PRILOSEC) 20 MG capsule, Take 20 mg by mouth daily., Disp: , Rfl:  .  rosuvastatin (CRESTOR) 5 MG tablet, Take 5 mg by mouth daily., Disp: , Rfl:  .  SYNTHROID 88 MCG tablet, TAKE 1 TABLET (88 MCG TOTAL) BY MOUTH DAILY BEFORE BREAKFAST., Disp: 30 tablet, Rfl: 5 .  trimethoprim-polymyxin b (POLYTRIM) ophthalmic solution, INSTILL 1 DROP INTO AFFECTED EYE EVERY FOUR HOURS WHILE AWAKE OPHTHALMIC, Disp: , Rfl:  .  VITAMIN D, CHOLECALCIFEROL, PO, Take 50,000 Units by mouth once a week. , Disp: , Rfl:  .  DULoxetine (CYMBALTA) 60 MG capsule, Take 30 mg by mouth daily. Take 3 tablets at one time to make 90 mg, Disp: , Rfl:   Allergies as of 04/09/2018 - Review Complete 04/09/2018  Allergen Reaction Noted  . Acyclovir and related  04/01/2017  . Amoxicillin Hives 06/18/2010  . Codeine Nausea And Vomiting 02/21/2013  . Lorabid [loracarbef] Hives 06/18/2010  . Sulfa antibiotics Hives 06/18/2010  . Sulfur Hives 09/08/2016    1. Work and Family: She retired in June 2013, but later went back to work part-time. She still works on Tuesdays and Thursdays. She still stays busy. Her husband, who is 75, still works 4 days per week.   2. Activities: She has not been walking much in the cold weather.   3. Smoking, alcohol, or drugs: None 4. Primary Care Provider: Dr. Carol Ada, [facility] at [place]: There are no other significant problems involving Andriana's other body systems.   Objective:  Vital Signs:  BP 132/72   Pulse 88   Wt 159 lb 9.6 oz (72.4 kg)   BMI 26.56 kg/m     PHYSICAL EXAMINATION  Constitutional: Lakhia looks surprisingly good today. She is very alert, bright, and has a good sense of humor. Her affect is normal and she is more upbeat  today. Her insight is always good. Her weight has increased 4 pounds.  Eyes: There is no obvious arcus or proptosis. Moisture appears normal. Mouth: The oropharynx and tongue appear normal. Oral moisture is normal. There  is no oral hyperpigmentation. Neck: The neck appears to be visibly normal. No carotid bruits are noted. The thyroid gland is again within normal limits at about 18-20 grams in size. The consistency of the thyroid gland is normal. The thyroid gland is not tender to palpation. Lungs: The lungs are clear to auscultation. Air movement is fairly good, but her expiratory rate is slow.  Heart: Heart rate and rhythm are regular. Heart sounds S1 and S2 are normal. I did not appreciate any pathologic cardiac murmurs. Abdomen: The abdomen is somewhat enlarged. Bowel sounds are normal. There is no obvious hepatomegaly, splenomegaly, or other mass effect. The abdomen is not tender to palpation. Arms: Muscle size and bulk are normal for age.  Hands: There is no tremor. Phalangeal and metacarpophalangeal joints are normal. Palmar muscles are low-normal. Palmar moisture is also normal. There is no palmar erythema. Palms are warm. Legs: Muscles appear normal for age. No edema is present. Shins are sore to the touch.  Neurologic: Strength is normal for age in both the upper and lower extremities. Muscle tone is normal. Sensation to touch is normal in the legs.   LAB DATA:   Labs 04/09/18: HbA1c 6.4%, CBG 115  Labs 04/08/18: TSH 1.02, free T4 1.4, free T3 2.4; PTH 16, calcium 9.5, 25-OH vitamin D 45  Labs 10/07/17: HbA1c 6.2%, CBG 135  Labs 10/02/17: TSH 0.66, free T4 1.3, free T3.9; PTH 36, calcium 9.6, 25-OH vitamin D 53 2  Labs 04/20/17: HbA1c 6.0%, CBG 112  Labs 04/16/17: TSH 1.12, free 4 1.5, free T3 3.2; 25-OH vitamin D 55  Labs 10/13/16: HbA1c 6.1%, CBG 122  Labs 10/07/16: TSH 2.63, free T4 1.3, free T3 2.9; 25-OH vitamin D 47  Labs 06/05/16: HbA1c 5.8%, fasting glucose 120; TSH  1.40, free  T4 1.1, free T3 2.7  Labs 02/22/16: HbA1c 6.4%, CBG 104, C-peptide 1.57 (ref 080-3.85); PTH 28, calcium 10, 25-OH vitamin D 45; TSH 0.16, free T4 1.3, free T3 2.8; CMP normal  Labs 11/11/15: TSH 0.05, free T4 1.5, free T3 3.6  Labs 08/14/15: HbA1c 5.8%  Labs 04/27/15: HbA1c 6.3%; TSH 1.04, free T4 1.3, free T3 2.8; PTH 45, calcium 9.4, 25-OH vitamin D 19  Labs 04/24/15: C-peptide 1.43 (normal 0.80-3.90)  Labs 12/04/14: HbA1c 6.1%  Labs 09/26/14 at 12:01 PM: CMP normal; HbA1c 6.2%;CBC normal, iron 81; ACTH 21, cortisol 12.9; vitamin B12 705 (normal 211-911)  09/19/14: TSH 1.640, free T4 1.03, free T3 2.6; Calcium 9.3, PTH 33 (increased from 26 in March), 25-OH vitamin D 21 (decreased from 23 in march)  04/28/14: TSH 0.081, free T4 1.47, free T3 3.3; calcium 9.4, PTH 26, 25-OH vitamin D 23  10/27/13: TSH 1.087, free T4 1.04, free T3 2.7; Calcium 9.6, PTH 32, 25-hydroxyvitamin D 34  05/18/13: TSH 0.052, free T4 1.24, free T3 3.2  04/28/13: TSH 0.426, free T4 1.24, free T3 3.2; calcium 9.7, 25-hydroxy vitamin D 36  10/08/12: TSH 2.106, free T4 1.05, free T3 2.6; 25-OH vitamin D 37, PTH 39.4, calcium 9.1  04/02/12: TSH 1.073, free T4 1.31, free T3 2.9, calcium 9.5, 25-hydroxy vitamin D 31 - PTH was ordered but not done. I have re-ordered it this morning.  09/30/11: TSH 8.095, free T4 1.16, free T3 2.4. PTH 24, calcium 9.3, 25-hydroxy vitamin D 33, 1,25-dihydroxy vitamin D 95 11/21/10: Calcium was 9.5. Her PTH was 37.6. 25-hydroxy vitamin D was 41. 1, 25-dihydroxy vitamin D was 85, which was slightly elevated. B12 was 352 (211-911)  IMAGING:   12/07/14 US thyroid: Both lobes are smaller, with maximum dimensions <2.5 cm. Both lobes are heterogenous in echotexture. The previously seen exophytic, solid, hypoechoic, ill-defined nodule in the left inferior pole remains at 0.9 x 0.4 x 0.5 cm and is unchanged from 08/23/2009.   10/12/12: US thyroid gland: Small 10 x 4 x 5 mm nodule or adjacent lymph node  or parathyroid adenoma. Diffuse echogenicity. As I read the [place], there is definitely a lot of echogenicity c/w Hashimoto's thyroiditis. There may be a small nodule that is actually a bit less than one cm in longest dimension. This is unchanged since June 2011.   Assessment and Plan:   ASSESSMENT:  1. Hypothyroid:   A. She has acquired hypothyroidism due to Hashimoto's thyroiditis. During the past 11 years her TFTs have been variable, requiring changes in her Synthroid dosage. Part of the variability was due to flare ups of Hashimoto's disease causing Hashitoxicosis.   B. Her TFTs in February 2019 were in the middle of the real physiologic normal range on her current Synthroid dose. Her TFTs in August 2019 were more variable. Her TSH was lower, her free T4 was lower, and her free T3 was higher. This pattern is most c/w having had a recent  flare up of thyroiditis.  C. Her TFTS in February 2020 were mid-normal.  2. Thyroiditis: The patient's Hashimoto's disease has usually been clinically quiescent, but was mildly Hashitoxic in the January-March 2016 time period. Her thyroid US in October 2016 showed the heterogeneity c/w Hashimoto's thyroiditis. As noted above, she appeared to have had a recent flare up of thyroiditis prior to her August 2019 visit. Her thyroiditis is clinically quiescent today.  3. Multinodular goiter:   A. Thyroid gland was again within normal limits for size at her last visit. The waxing and waning of thyroid gland size is c/w evolving Hashimoto's disease. Although some [place] studies have shown a nodule, her [place] study in June 2011 showed either a questionable nodule or demonstrated the echotexture heterogeneity that is commonly seen with Hashimoto's disease. Her [place] studies in August 2014 and October 2016 were essentially unchanged. She did not need a FNA at those times.  B. Her thyroid gland is not enlarged today. I do not palpate any nodular area.  4. Headaches: Headaches appear to be  "tension" headaches. They have significantly increased in both frequency and severity. She does better when she performs her cervical stretching exercises daily.   5. Hot flashes and sweats: These problems recur intermittently.   6-7. Leg pains/vitamin D deficiency disease:   A. Her labs in July 2016 showed that her calcium was normal at 9.3, just below the 50%. Her PTH value was mid-range normal. Her vitamin D was lower. She needed more calcium and vitamin D. I had previously asked her to take Citracal-D at doses of 1200-1500 mg of calcium per day and [phone] IU of vitamin D per day.   B. Her labs in March 2017 showed that her PTH value of 45 was higher, but still mid-normal. Calcium was a bit higher at 9.4. Vitamin D was lower at 19. At that point she agreed to purchase and take the Biotech form of vitamin D, one 50,000 IU capsule each week.   C. Her PTH, calcium and vitamin D were normal in December 2017, although the vitamin D had decreased.   D. She is now taking Biotech vitamin D, 50,000 units each week.  Her vitamin D levels were normal again  in August 2018, in August 2019, and in February 2020.  Her PTH was lower in February 2020, but probably appropriate to her calcium level.  8. Meniere's disease: Her vertigo has essentially resolved. Her Meniere's disease has bothered her a bit more in the past few months. Her hearing in the left ear varies.  9. Hoarseness: The fact that her hoarseness and post nasal drip come and go essentially in parallel indicate that the hoarseness is likely due to flare ups of her allergies. However, several flare ups of Hashimoto's disease have also caused hoarseness in the past. 10. Dyspepsia: This problem is reasonably well controlled with Protonix. 11. Hypertension: Her BP is normal today on her losartan.     13. Depression: Her affect and insight are very good today.   14. Fibromyalgia syndrome: This condition remains a problem for her, but seems to have worsened.   Exercise will help her "re-charge her mitochondrial batteries".  15. Fatigue:  This problem has also worsened.. 16. Prediabetes: Her HbA1c values in July and in October 2016 were elevated into the "pre-diabetes" range. Her HbA1c values have fluctuated over time depending upon her diet and her activity levels.  Her HbA1c today was higher than at her last visit, probably mostly due to being sick, not exercising, and having a greater appetite and eating more while taking prednisone.  17. Arthritis: Her arthritis seems to be doing fairly well.   59. Overweight: Ms. Laroque is more overweight, most likely due to all the steroids she took in January.  She has gained four pounds since her last visit.   PLAN:  1. Diagnostic: Reviewed HbA1c, CBG, TFTs, calcium, PTH, and vitamin D results. I ordered TFTS, HbA1c, PTH, calcium, and vitamin D prior to next visit.  2. Therapeutic: Try to do daily physical activity. Watch what she eats and drinks. Continue Synthroid at current doses. Take MVI daily and Biotech, 50,000 IU per week. Take one Tums at bedtime.  3. Patient education: We again discussed the issues of prediabetes, headaches, Hashimoto's disease, hypothyroidism, fibromyalgia, fatigue, and COPD. We discussed what she needs to do to control her BGs, to include getting more exercise and following our Eat Right Diet. She knows what she needs to do to get her weight back under control, but her COPD makes that increasingly more difficult.    4. Follow-up: 6 months  Level of Service: This visit lasted in excess of 55 minutes. More than 50% of the visit was devoted to counseling.   Tillman Sers, MD, CDE Adult and Pediatric Endocrinology

## 2018-04-09 NOTE — Patient Instructions (Addendum)
Follow up visit in 6 months. Please repeat lab tests 1-2 weeks prior.  

## 2018-04-10 DIAGNOSIS — R7303 Prediabetes: Secondary | ICD-10-CM | POA: Insufficient documentation

## 2018-04-12 NOTE — Telephone Encounter (Signed)
See phone note from 04/02/18.

## 2018-04-13 ENCOUNTER — Telehealth: Payer: Self-pay

## 2018-04-13 DIAGNOSIS — R0602 Shortness of breath: Secondary | ICD-10-CM

## 2018-04-13 NOTE — Addendum Note (Signed)
Addended by: Della Goo C on: 04/13/2018 03:50 PM   Modules accepted: Orders

## 2018-04-13 NOTE — Telephone Encounter (Signed)
Sammuel Hines, CMA        Hey girl Hey! :)   Ms. Prouse's original order shows continuous O2; however, testing and notes show she is only nocturnal. Could you please get [place] an updated RX to show only the nocturnal O2? And then we will need a d/c of the portable O2, as well.   Thank you!  Christina    __________________________________________________________________________________  Received community message from [place]. Looked in patients chart and see where Aaron Edelman had stated per patients ONO she can stop day time oxygen and only use nocturnal. Order was sent for both. I have discontinued the daytime oxygen per BJT and have sent in a new order for nocturnal only.

## 2018-04-16 ENCOUNTER — Other Ambulatory Visit: Payer: Self-pay | Admitting: Family Medicine

## 2018-04-16 DIAGNOSIS — Z1231 Encounter for screening mammogram for malignant neoplasm of breast: Secondary | ICD-10-CM

## 2018-04-22 ENCOUNTER — Ambulatory Visit (INDEPENDENT_AMBULATORY_CARE_PROVIDER_SITE_OTHER): Payer: Medicare Other | Admitting: Pulmonary Disease

## 2018-04-22 ENCOUNTER — Encounter: Payer: Self-pay | Admitting: Pulmonary Disease

## 2018-04-22 ENCOUNTER — Telehealth: Payer: Self-pay | Admitting: Pulmonary Disease

## 2018-04-22 VITALS — BP 138/66 | HR 91 | Temp 97.5°F | Ht 63.5 in | Wt 158.8 lb

## 2018-04-22 DIAGNOSIS — J479 Bronchiectasis, uncomplicated: Secondary | ICD-10-CM | POA: Insufficient documentation

## 2018-04-22 DIAGNOSIS — J449 Chronic obstructive pulmonary disease, unspecified: Secondary | ICD-10-CM

## 2018-04-22 DIAGNOSIS — G4736 Sleep related hypoventilation in conditions classified elsewhere: Secondary | ICD-10-CM

## 2018-04-22 DIAGNOSIS — J439 Emphysema, unspecified: Secondary | ICD-10-CM | POA: Diagnosis not present

## 2018-04-22 DIAGNOSIS — J471 Bronchiectasis with (acute) exacerbation: Secondary | ICD-10-CM | POA: Diagnosis not present

## 2018-04-22 MED ORDER — DOXYCYCLINE HYCLATE 100 MG PO TABS: 100.0000 mg | ORAL_TABLET | Freq: Two times a day (BID) | ORAL | 0 refills | Status: DC

## 2018-04-22 MED ORDER — FLUTTER DEVI: 0 refills | Status: DC

## 2018-04-22 NOTE — Assessment & Plan Note (Signed)
Assessment: mMRC 1 today Lungs clear to auscultation today Maintained well on Trelegy Ellipta Has not had to use her rescue inhaler 1 week of productive cough with yellow mucus  Plan: Continue Trelegy Ellipta inhaler Continue rescue inhaler as needed Follow-up with our office in 2 to 3 months Needs Pneumovax 23 in July/2020

## 2018-04-22 NOTE — Progress Notes (Signed)
@Patient  ID: Shelby Lin, female    DOB: 1940-08-22, 78 y.o.   MRN: [id number]  Chief Complaint  Patient presents with  . Acute Visit    Increased cough- prod with min yellow sputum.    Referring provider: Carol Ada, MD  HPI:  78 year old female former smoker followed in our office for gold 3 COPD  PMH: Hypothyroidism due to Hashimoto's thyroiditis (managed by endocrinology [name]) Smoker/ Smoking History: Former smoker.  Quit 2002.  21-pack-year smoking history. Maintenance: Trelegy Ellipta Pt of: [name]  04/22/2018  - Visit   78 year old female former smoker presenting to our office today for an acute visit.  Patient reporting that she has had increased cough for the last 4 to 6 weeks.  Patient reports the cough has been productive with yellow sputum.  Patient with known bronchiectasis.  Patient does not maintain on a flutter valve.  Patient reports at baseline she has an occasional cough that is sometimes productive with clear sputum.  This cough has now become regularly productive with a sputum color changed to yellow over the last week.  Patient reports adherence to her Trelegy Ellipta inhaler.  Patient has not had to use her rescue inhaler since last office visit.  Patient has been adherent to nocturnal oxygen use.   MMRC - Breathlessness Score 1 - I get short of breath when hurrying on level ground or walking up a slight hill     Tests:   02/28/2018-chest x-ray- COPD changes without acute infiltrate, emphysematous and central bronchitic changes consistent with COPD  01/28/2018-CT chest without contrast-right middle lobe small solid pulmonary nodules are stable decreasing comparison at 02/11/2017 chest CT, considered benign, stable minimal scattered cylindrical bronchiectasis medial upper lobes and medial right middle lobe  02/11/2017-CT chest high-res- minimal scattered cylindrical bronchiectasis with associated minimal tree-in-bud opacity in the medial  upper lobes and medial right middle lobes, findings could be consistent of MAI >>>Mild to moderate centrilobular emphysema with mild diffuse bronchial wall thickening, 2 small solid pulmonary nodules in right lung largest with average diameter 5 mm in the right middle lobe  04/01/2017-pulmonary function test- FVC 1.69 (61% predicted), postbronchodilator ratio 48, postbronchodilator FEV1 46, positive bronchodilator response, mid flow reversibility after bronchodilator, DLCO 52 >>>Severe obstructive airways disease >>>Patient did take Incruse 1 puff at 8 AM prior to testing  03/30/2018-overnight oximetry- less than 88% for 43 minutes and 43 seconds, less than 89% for an hour and 36 minutes of 51 seconds, baseline SPO2 of 91%  FENO:  No results found for: NITRICOXIDE  PFT: PFT Results Latest Ref Rng & Units 04/01/2017  FVC-Pre L 1.69  FVC-Predicted Pre % 61  FVC-Post L 2.02  FVC-Predicted Post % 73  Pre FEV1/FVC % % 45  Post FEV1/FCV % % 48  FEV1-Pre L 0.76  FEV1-Predicted Pre % 36  FEV1-Post L 0.96  DLCO UNC% % 52  DLCO COR %Predicted % 84    Imaging: No results found.    Specialty Problems      Pulmonary Problems   Stage 3 severe COPD by GOLD classification ([place])    02/11/2017-CT chest high-res- minimal scattered cylindrical bronchiectasis with associated minimal tree-in-bud opacity in the medial upper lobes and medial right middle lobes, findings could be consistent of MAI >>>Mild to moderate centrilobular emphysema with mild diffuse bronchial wall thickening, 2 small solid pulmonary nodules in right lung largest with average diameter 5 mm in the right middle lobe  04/01/2017-pulmonary function test- FVC 1.69 (61% predicted),  postbronchodilator ratio 48, postbronchodilator FEV1 46, positive bronchodilator response, mid flow reversibility after bronchodilator, DLCO 52 >>>Severe obstructive airways disease >>>Patient did take Incruse 1 puff at 8 AM prior to testing  02/28/2018-chest  x-ray- COPD changes without acute infiltrate, emphysematous and central bronchitic changes consistent with COPD  02/28/2018 - ED - COPD exacerbation       Bronchiectasis with acute exacerbation (HCC)    02/11/2017-CT chest high-res- minimal scattered cylindrical bronchiectasis with associated minimal tree-in-bud opacity in the medial upper lobes and medial right middle lobes, findings could be consistent of MAI >>>Mild to moderate centrilobular emphysema with mild diffuse bronchial wall thickening, 2 small solid pulmonary nodules in right lung largest with average diameter 5 mm in the right middle lobe  01/28/2018-CT chest without contrast-right middle lobe small solid pulmonary nodules are stable decreasing comparison at 02/11/2017 chest CT, considered benign, stable minimal scattered cylindrical bronchiectasis medial upper lobes and medial right middle lobe      Bronchiectasis without complication (HCC)   Nocturnal hypoxemia due to emphysema (HCC)    03/30/2018-overnight oximetry- less than 88% for 43 minutes and 43 seconds, less than 89% for an hour and 36 minutes of 51 seconds, baseline SPO2 of 91%         Allergies  Allergen Reactions  . Acyclovir And Related   . Amoxicillin Hives  . Codeine Nausea And Vomiting  . Lorabid [Loracarbef] Hives  . Sulfa Antibiotics Hives  . Sulfur Hives    Immunization History  Administered Date(s) Administered  . Influenza, High Dose Seasonal PF 12/12/2016, 11/27/2017  . Pneumococcal Conjugate-13 10/16/2017    Past Medical History:  Diagnosis Date  . Arthritis   . Combined hyperlipidemia   . COPD (chronic obstructive pulmonary disease) ([place])   . Coronary artery spasm ([place])   . Dupuytren's contracture   . Dyspepsia   . Familial tremor   . Fatigue   . Fibromyalgia syndrome   . GERD (gastroesophageal reflux disease)   . Hypertension   . Hypothyroidism, acquired, autoimmune   . Multinodular goiter (nontoxic)   . Tachycardia   .  Thyroiditis, autoimmune   . Wears glasses     Tobacco History: Social History   Tobacco Use  Smoking Status Former Smoker  . Packs/day: 0.50  . Years: 42.00  . Pack years: 21.00  . Types: Cigarettes  . Last attempt to quit: 02/21/2001  . Years since quitting: 17.1  Smokeless Tobacco Never Used   Counseling given: Yes  Continue to not smoke  Outpatient Encounter Medications as of 04/22/2018  Medication Sig  . albuterol (PROVENTIL HFA;VENTOLIN HFA) 108 (90 Base) MCG/ACT inhaler Inhale 1-2 puffs into the lungs every 6 (six) hours as needed for wheezing or shortness of breath.  . Cyanocobalamin (VITAMIN B 12 PO) Take by mouth daily.  . DULoxetine (CYMBALTA) 30 MG capsule TAKE 3 CAPSULES BY MOUTH ONCE DAILY ONCE A DAY ORALLY 30  . Fluticasone-Umeclidin-Vilant (TRELEGY ELLIPTA) 100-62.5-25 MCG/INH AEPB Inhale 1 puff into the lungs daily.  . furosemide (LASIX) 20 MG tablet TAKE 1 TABLET BY MOUTH EVERY DAY AS NEEDED FOR SWELLING  . hydrochlorothiazide (HYDRODIURIL) 25 MG tablet TAKE 1 TABLET BY MOUTH EVERY DAY IN THE MORNING  . losartan (COZAAR) 100 MG tablet Take 100 mg by mouth daily.  . Multiple Vitamin (MULTIVITAMIN) tablet Take 1 tablet by mouth daily.    Marland Kitchen omeprazole (PRILOSEC) 20 MG capsule Take 20 mg by mouth daily.  . rosuvastatin (CRESTOR) 5 MG tablet Take 5 mg by  mouth daily.  Marland Kitchen SYNTHROID 88 MCG tablet TAKE 1 TABLET (88 MCG TOTAL) BY MOUTH DAILY BEFORE BREAKFAST.  Marland Kitchen VITAMIN D, CHOLECALCIFEROL, PO Take 50,000 Units by mouth once a week.   . doxycycline (VIBRA-TABS) 100 MG tablet Take 1 tablet (100 mg total) by mouth 2 (two) times daily.  Marland Kitchen Respiratory Therapy Supplies (FLUTTER) DEVI Use device 2-3 times a day to break up congestion  . [DISCONTINUED] DULoxetine (CYMBALTA) 60 MG capsule Take 30 mg by mouth daily. Take 3 tablets at one time to make 90 mg  . [DISCONTINUED] losartan-hydrochlorothiazide (HYZAAR) 100-25 MG tablet Take 1 tablet by mouth daily.  . [DISCONTINUED]  trimethoprim-polymyxin b (POLYTRIM) ophthalmic solution INSTILL 1 DROP INTO AFFECTED EYE EVERY FOUR HOURS WHILE AWAKE OPHTHALMIC   No facility-administered encounter medications on file as of 04/22/2018.      Review of Systems  Review of Systems  Constitutional: Positive for fatigue (baseline). Negative for chills, fever and unexpected weight change.  HENT: Positive for postnasal drip. Negative for congestion, ear pain, sinus pressure and sinus pain.   Respiratory: Positive for cough (productive sputum - week of symptoms), shortness of breath and wheezing (with exertion ). Negative for chest tightness.   Cardiovascular: Negative for chest pain and palpitations.  Gastrointestinal: Negative for diarrhea, nausea and vomiting.  Skin: Negative for color change.  Allergic/Immunologic: Negative for environmental allergies and food allergies.  Neurological: Positive for headaches (Routine morning headaches, patient reports this is her baseline). Negative for dizziness and light-headedness.  Psychiatric/Behavioral: Negative for dysphoric mood. The patient is not nervous/anxious.   All other systems reviewed and are negative.    Physical Exam  BP 138/66 (BP Location: Left Arm, Cuff Size: Normal)   Pulse 91   Temp (!) 97.5 F (36.4 C) (Oral)   Ht 5' 3.5" (1.613 m)   Wt 158 lb 12.8 oz (72 kg)   SpO2 99%   BMI 27.69 kg/m   Wt Readings from Last 5 Encounters:  04/22/18 158 lb 12.8 oz (72 kg)  04/09/18 159 lb 9.6 oz (72.4 kg)  03/15/18 157 lb 6.4 oz (71.4 kg)  03/01/18 158 lb 3.2 oz (71.8 kg)  02/28/18 162 lb (73.5 kg)   Room air  Physical Exam  Constitutional: She is oriented to person, place, and time and well-developed, well-nourished, and in no distress. No distress.  HENT:  Head: Normocephalic and atraumatic.  Right Ear: Hearing and external ear normal.  Left Ear: Hearing, tympanic membrane, external ear and ear canal normal.  Nose: Mucosal edema present. Right sinus exhibits no  maxillary sinus tenderness and no frontal sinus tenderness. Left sinus exhibits no maxillary sinus tenderness and no frontal sinus tenderness.  Mouth/Throat: Uvula is midline and oropharynx is clear and moist. No oropharyngeal exudate.  Postnasal drip Difficult to visualize right TM due to cerumen impaction  Eyes: Pupils are equal, round, and reactive to light.  Neck: Normal range of motion. Neck supple.  Cardiovascular: Normal rate, regular rhythm and normal heart sounds.  Pulmonary/Chest: Effort normal and breath sounds normal. No accessory muscle usage. No respiratory distress. She has no decreased breath sounds. She has no wheezes. She has no rhonchi. She has no rales.  Musculoskeletal: Normal range of motion.        General: No edema.  Lymphadenopathy:    She has no cervical adenopathy.  Neurological: She is alert and oriented to person, place, and time. Gait normal.  Skin: Skin is warm and dry. She is not diaphoretic. No erythema.  Psychiatric: Mood, memory, affect and judgment normal.  Nursing note and vitals reviewed.     Lab Results:  CBC    Component Value Date/Time   WBC 14.0 (H) 02/28/2018 1732   RBC 3.93 02/28/2018 1732   HGB 11.6 (L) 02/28/2018 1732   HCT 37.0 02/28/2018 1732   PLT 382 02/28/2018 1732   MCV 94.1 02/28/2018 1732   MCH 29.5 02/28/2018 1732   MCHC 31.4 02/28/2018 1732   RDW 12.3 02/28/2018 1732   LYMPHSABS 2.9 02/28/2018 1732   MONOABS 1.2 (H) 02/28/2018 1732   EOSABS 0.3 02/28/2018 1732   BASOSABS 0.1 02/28/2018 1732    BMET    Component Value Date/Time   NA 136 02/28/2018 1732   K 3.5 02/28/2018 1732   CL 98 02/28/2018 1732   CO2 29 02/28/2018 1732   GLUCOSE 133 (H) 02/28/2018 1732   BUN 12 02/28/2018 1732   CREATININE 0.63 02/28/2018 1732   CREATININE 0.59 (L) 02/22/2016 0001   CALCIUM 9.5 04/08/2018 0000   CALCIUM 9.7 04/06/2012 1019   GFRNONAA >60 02/28/2018 1732   GFRAA >60 02/28/2018 1732    BNP    Component Value Date/Time     BNP 25.5 02/28/2018 1732    ProBNP No results found for: PROBNP    Assessment & Plan:    Stage 3 severe COPD by GOLD classification ([place]) Assessment: mMRC 1 today Lungs clear to auscultation today Maintained well on Trelegy Ellipta Has not had to use her rescue inhaler 1 week of productive cough with yellow mucus  Plan: Continue Trelegy Ellipta inhaler Continue rescue inhaler as needed Follow-up with our office in 2 to 3 months Needs Pneumovax 23 in July/2020  Nocturnal hypoxemia due to emphysema [facility]) Assessment: February/2019 pulmonary function test showing a DLCO of 52 February/2020 overnight oximetry showed less than 88% for 43 minutes and 43 seconds February/2020 patient was started on O2 Patient reports adherence to wearing oxygen with the exception of occasionally it falls off  Plan: Continue oxygen therapy as prescribed 2 L at night  Bronchiectasis with acute exacerbation [facility]) Assessment: December/2018 as well as December/2019 showing cylindrical bronchiectasis Patient with 1 week of increased productive cough with yellow sputum Patient's baseline sputum production is none to clear and may be white Patient denies fevers Patient is not currently maintained on a flutter valve  Plan: Doxycycline today Start flutter valve today Follow-up in 2-3 months     Lauraine Rinne, NP 04/22/2018   This appointment was 32 min long with over 50% of the time in direct face-to-face patient care, assessment, plan of care, and follow-up.

## 2018-04-22 NOTE — Assessment & Plan Note (Signed)
Assessment: December/2018 as well as December/2019 showing cylindrical bronchiectasis Patient with 1 week of increased productive cough with yellow sputum Patient's baseline sputum production is none to clear and may be white Patient denies fevers Patient is not currently maintained on a flutter valve  Plan: Doxycycline today Start flutter valve today Follow-up in 2-3 months

## 2018-04-22 NOTE — Assessment & Plan Note (Signed)
Assessment: February/2019 pulmonary function test showing a DLCO of 52 February/2020 overnight oximetry showed less than 88% for 43 minutes and 43 seconds February/2020 patient was started on O2 Patient reports adherence to wearing oxygen with the exception of occasionally it falls off  Plan: Continue oxygen therapy as prescribed 2 L at night

## 2018-04-22 NOTE — Patient Instructions (Addendum)
Doxycycline >>> 1 100 mg tablet every 12 hours for 7 days >>>take with food  >>>wear sunscreen    Bronchiectasis: This is the medical term which indicates that you have damage, dilated airways making you more susceptible to respiratory infection. Use a flutter valve 10 breaths twice a day or 4 to 5 breaths 4-5 times a day to help clear mucus out Let us know if you have cough with change in mucus color or fevers or chills.  At that point you would need an antibiotic. Maintain a healthy nutritious diet, eating whole foods Take your medications as prescribed   Continue Trelegy Ellipta  >>> 1 puff daily in the morning >>>rinse mouth out after use  >>> This inhaler contains 3 medications that help manage her respiratory status, contact our office if you cannot afford this medication or unable to remain on this medication  Only use your albuterol as a rescue medication to be used if you can't catch your breath by resting or doing a relaxed purse lip breathing pattern.  - The less you use it, the better it will work when you need it. - Ok to use up to 2 puffs  every 4 hours if you must but call for immediate appointment if use goes up over your usual need - Don't leave home without it !!  (think of it like the spare tire for your car)   Note your daily symptoms > remember "red flags" for COPD:   >>>Increase in cough >>>increase in sputum production >>>increase in shortness of breath or activity  intolerance.   If you notice these symptoms, please call the office to be seen.     Continue oxygen therapy as prescribed - 2L at ngiht >>>maintain oxygen saturations greater than 88 percent  >>>if unable to maintain oxygen saturations please contact the office  >>>do not smoke with oxygen  >>>can use nasal saline gel or nasal saline rinses to moisturize nose if oxygen causes dryness     Follow up with our office in 2-3 months     It is flu season:   >>> Best ways to protect herself  from the flu: Receive the yearly flu vaccine, practice good hand hygiene washing with soap and also using hand sanitizer when available, eat a nutritious meals, get adequate rest, hydrate appropriately   Please contact the office if your symptoms worsen or you have concerns that you are not improving.   Thank you for choosing [facility] for your healthcare, and for allowing [place] to partner with you on your healthcare journey. I am thankful to be able to provide care to you today.   Shelby Quaker FNP-C    Bronchiectasis  Bronchiectasis is a condition in which the airways in the lungs (bronchi) are damaged and widened. The condition makes it hard for the lungs to get rid of mucus, and it causes mucus to gather in the bronchi. This condition often leads to lung infections, which can make the condition worse. What are the causes? You can be born with this condition or you can develop it later in life. Common causes of this condition include:  Cystic fibrosis.  Repeated lung infections, such as pneumonia or tuberculosis.  An object or other blockage in the lungs.  Breathing in fluid, food, or other objects (aspiration).  A problem with the immune system and lung structure that is present at birth (congenital). Sometimes the cause is not known. What are the signs or symptoms? Common symptoms of  this condition include:  A daily cough that brings up mucus and lasts for more than 3 weeks.  Lung infections that happen often.  Shortness of breath and wheezing.  Weakness and fatigue. How is this diagnosed? This condition is diagnosed with tests, such as:  Chest X-rays or CT scans. These are done to check for changes in the lungs.  Breathing tests. These are done to check how well your lungs are working.  A test of a sample of your saliva (sputum culture). This test is done to check for infection.  Blood tests and other tests. These are done to check for related diseases or  causes. How is this treated? Treatment for this condition depends on the severity of the illness and its cause. Treatment may include:  Medicines that loosen mucus so it can be coughed up (expectorants).  Medicines that relax the muscles of the bronchi (bronchodilators).  Antibiotic medicines to prevent or treat infection.  Physical therapy to help clear mucus from the lungs. Techniques may include: ? Postural drainage. This is when you sit or lie in certain positions so that mucus can drain by gravity. ? Chest percussion. This involves tapping the chest or back with a cupped hand. ? Chest vibration. For this therapy, a hand or special equipment vibrates your chest and back.  Surgery to remove the affected part of the lung. This may be done in severe cases. Follow these instructions at home: Medicines  Take over-the-counter and prescription medicines only as told by your health care provider.  If you were prescribed an antibiotic medicine, take it as told by your health care provider. Do not stop taking the antibiotic even if you start to feel better.  Avoid taking sedatives and antihistamines unless your health care provider tells you to take them. These medicines tend to thicken the mucus in the lungs. Managing symptoms  Perform breathing exercises or techniques to clear your lungs as told by your health care provider.  Consider using a cold steam vaporizer or humidifier in your room or home to help loosen secretions.  If you have a cough that gets worse at night, try sleeping in a semi-upright position. General instructions  Get plenty of rest.  Drink enough fluid to keep your urine clear or pale yellow.  Stay inside when pollution and ozone levels are high.  Stay up to date with vaccinations and immunizations.  Avoid cigarette smoke and other lung irritants.  Do not use any products that contain nicotine or tobacco, such as cigarettes and e-cigarettes. If you need help  quitting, ask your health care provider.  Keep all follow-up visits as told by your health care provider. This is important. Contact a health care provider if:  You cough up more sputum than before and the sputum is yellow or green in color.  You have a fever.  You cannot control your cough and are losing sleep. Get help right away if:  You cough up blood.  You have chest pain.  You have increasing shortness of breath.  You have pain that gets worse or is not controlled with medicines.  You have a fever and your symptoms suddenly get worse. Summary  Bronchiectasis is a condition in which the airways in the lungs (bronchi) are damaged and widened. The condition makes it hard for the lungs to get rid of mucus, and it causes mucus to gather in the bronchi.  Treatment usually includes therapy to help clear mucus from the lungs.  Stay up  to date with vaccinations and immunizations. This information is not intended to replace advice given to you by your health care provider. Make sure you discuss any questions you have with your health care provider. Document Released: 12/08/2006 Document Revised: 03/17/2016 Document Reviewed: 03/17/2016 Elsevier Interactive Patient Education  2019 [employer].

## 2018-04-22 NOTE — Telephone Encounter (Signed)
Called and spoke with Patient. She stated that she received a flutter yesterday.  She requested information on cleaning instructions.  Flutter cleaning instructions placed in envelope at front desk for pick up.  Nothing further at this time.

## 2018-04-26 ENCOUNTER — Ambulatory Visit (INDEPENDENT_AMBULATORY_CARE_PROVIDER_SITE_OTHER): Payer: Medicare Other | Admitting: Pulmonary Disease

## 2018-04-26 ENCOUNTER — Encounter: Payer: Self-pay | Admitting: Pulmonary Disease

## 2018-04-26 ENCOUNTER — Ambulatory Visit (INDEPENDENT_AMBULATORY_CARE_PROVIDER_SITE_OTHER)
Admission: RE | Admit: 2018-04-26 | Discharge: 2018-04-26 | Disposition: A | Discharge status: Home / Self Care | Payer: Medicare Other | Source: Ambulatory Visit | Attending: Pulmonary Disease | Admitting: Pulmonary Disease

## 2018-04-26 ENCOUNTER — Ambulatory Visit: Payer: Medicare Other | Admitting: Pulmonary Disease

## 2018-04-26 VITALS — BP 104/60 | HR 101 | Ht 63.5 in | Wt 155.4 lb

## 2018-04-26 DIAGNOSIS — J449 Chronic obstructive pulmonary disease, unspecified: Secondary | ICD-10-CM | POA: Diagnosis not present

## 2018-04-26 DIAGNOSIS — J471 Bronchiectasis with (acute) exacerbation: Secondary | ICD-10-CM

## 2018-04-26 DIAGNOSIS — R0602 Shortness of breath: Secondary | ICD-10-CM | POA: Diagnosis not present

## 2018-04-26 DIAGNOSIS — G4736 Sleep related hypoventilation in conditions classified elsewhere: Secondary | ICD-10-CM

## 2018-04-26 DIAGNOSIS — J439 Emphysema, unspecified: Secondary | ICD-10-CM

## 2018-04-26 MED ORDER — PREDNISONE 10 MG PO TABS: ORAL_TABLET | ORAL | 0 refills | Status: DC

## 2018-04-26 MED ORDER — DOXYCYCLINE HYCLATE 100 MG PO TABS: 100.0000 mg | ORAL_TABLET | Freq: Two times a day (BID) | ORAL | 0 refills | Status: DC

## 2018-04-26 NOTE — Assessment & Plan Note (Addendum)
Assessment: mMRC 3 today End expiratory wheeze on exam today Maintained on Trelegy Ellipta Using [facility] rescue inhaler 2 times daily Productive cough with yellow mucus as well as increased fatigue and dyspnea  Plan: Continue doxycycline, will extend doxycycline prescription to 10 days total sent an additional 3 days today Prednisone taper Continue Trelegy Ellipta Continue rescue inhaler as needed Continue flutter valve Keep scheduled follow-up with our office Contact our office later this week to update on how they are doing Needs Pneumovax 23 in August/2020

## 2018-04-26 NOTE — Assessment & Plan Note (Signed)
Assessment: December/2018 as well as December/2019 showing mild cylindrical bronchiectasis Continued productive cough with yellow sputum Increased fatigue and dyspnea Maintained on flutter valve since last office visit Currently taking doxycycline Noisy chest on exam today  Plan: Continue doxycycline for total of 10 days Continue flutter valve Prednisone taper Keep scheduled follow-up with our office

## 2018-04-26 NOTE — Progress Notes (Signed)
@Patient  ID: Shelby Lin, female    DOB: 1940/04/28, 78 y.o.   MRN: [id number]  Chief Complaint  Patient presents with  . Follow-up    prod cough/yellow, SOB    Referring provider: Carol Ada, MD  HPI:  78 year old female former smoker followed in our office for gold 3 COPD  PMH: Hypothyroidism due to Hashimoto's thyroiditis (managed by endocrinology [name]) Smoker/ Smoking History: Former smoker.  Quit 2002.  21-pack-year smoking history. Maintenance: Trelegy Ellipta Pt of: [name]  04/26/2018  - Visit   78 year old female former smoker presenting to our office today for an acute visit.  Patient was recently seen in office on 04/22/2018 and was treated as a bronchiectasis exacerbation with doxycycline as well as started on flutter valve.  Patient reports that she has not felt any better since last office visit.  Patient actually reports that she feels like she is worsened.  Patient has not been as active and has remained in bed most of the time.  Patient also endorses increased shortness of breath as well as wheezing.  Patient reports adherence to Trelegy Ellipta inhaler.  Patient has had to use her rescue inhaler 2 times daily over the past couple of days.  Patient reports she has been adherent to using her flutter valve and has been taking her doxycycline as prescribed.  Patient continues to have productive cough with yellow mucus.  MMRC - Breathlessness Score 3 - I stop for breath after walking about 100 yards or after a few minutes on level ground (isle at grocery store is 120ft)   Tests:    02/28/2018-chest x-ray- COPD changes without acute infiltrate, emphysematous and central bronchitic changes consistent with COPD  01/28/2018-CT chest without contrast-right middle lobe small solid pulmonary nodules are stable decreasing comparison at 02/11/2017 chest CT, considered benign, stable minimal scattered cylindrical bronchiectasis medial upper lobes and medial right  middle lobe  02/11/2017-CT chest high-res- minimal scattered cylindrical bronchiectasis with associated minimal tree-in-bud opacity in the medial upper lobes and medial right middle lobes, findings could be consistent of MAI >>>Mild to moderate centrilobular emphysema with mild diffuse bronchial wall thickening, 2 small solid pulmonary nodules in right lung largest with average diameter 5 mm in the right middle lobe  04/01/2017-pulmonary function test- FVC 1.69 (61% predicted), postbronchodilator ratio 48, postbronchodilator FEV1 46, positive bronchodilator response, mid flow reversibility after bronchodilator, DLCO 52 >>>Severe obstructive airways disease >>>Patient did take Incruse 1 puff at 8 AM prior to testing  03/30/2018-overnight oximetry- less than 88% for 43 minutes and 43 seconds, less than 89% for an hour and 36 minutes of 51 seconds, baseline SPO2 of 91%  FENO:  No results found for: NITRICOXIDE  PFT: PFT Results Latest Ref Rng & Units 04/01/2017  FVC-Pre L 1.69  FVC-Predicted Pre % 61  FVC-Post L 2.02  FVC-Predicted Post % 73  Pre FEV1/FVC % % 45  Post FEV1/FCV % % 48  FEV1-Pre L 0.76  FEV1-Predicted Pre % 36  FEV1-Post L 0.96  DLCO UNC% % 52  DLCO COR %Predicted % 84    Imaging: Dg Chest 2 View  Result Date: 04/26/2018 CLINICAL DATA:  78 year old female with shortness of breath EXAM: CHEST - 2 VIEW COMPARISON:  Prior chest x-ray 02/28/2018; prior CT scan of the chest 01/28/2018 FINDINGS: Slightly increased patchy airspace opacity in the left upper lung compared to prior imaging. This appearance is trapezoidal in shape and corresponds with overlap between the clavicle and first rib. The lungs remain  hyperinflated with mild central airway thickening. Stable biapical pleuroparenchymal scarring. Cardiac and mediastinal contours remain unchanged. Atherosclerotic calcifications are present within the transverse aorta. Surgical clips noted in the right upper quadrant. No acute osseous  abnormality. IMPRESSION: Stable chest x-ray without acute cardiopulmonary process. Electronically Signed   By: Jacqulynn Cadet M.D.   On: 04/26/2018 15:14      Specialty Problems      Pulmonary Problems   Stage 3 severe COPD by GOLD classification ([place])    02/11/2017-CT chest high-res- minimal scattered cylindrical bronchiectasis with associated minimal tree-in-bud opacity in the medial upper lobes and medial right middle lobes, findings could be consistent of MAI >>>Mild to moderate centrilobular emphysema with mild diffuse bronchial wall thickening, 2 small solid pulmonary nodules in right lung largest with average diameter 5 mm in the right middle lobe  04/01/2017-pulmonary function test- FVC 1.69 (61% predicted), postbronchodilator ratio 48, postbronchodilator FEV1 46, positive bronchodilator response, mid flow reversibility after bronchodilator, DLCO 52 >>>Severe obstructive airways disease >>>Patient did take Incruse 1 puff at 8 AM prior to testing  02/28/2018-chest x-ray- COPD changes without acute infiltrate, emphysematous and central bronchitic changes consistent with COPD  02/28/2018 - ED - COPD exacerbation       Bronchiectasis with acute exacerbation ([place])    02/11/2017-CT chest high-res- minimal scattered cylindrical bronchiectasis with associated minimal tree-in-bud opacity in the medial upper lobes and medial right middle lobes, findings could be consistent of MAI >>>Mild to moderate centrilobular emphysema with mild diffuse bronchial wall thickening, 2 small solid pulmonary nodules in right lung largest with average diameter 5 mm in the right middle lobe  01/28/2018-CT chest without contrast-right middle lobe small solid pulmonary nodules are stable decreasing comparison at 02/11/2017 chest CT, considered benign, stable minimal scattered cylindrical bronchiectasis medial upper lobes and medial right middle lobe      Bronchiectasis without complication (HCC)   Nocturnal  hypoxemia due to emphysema (HCC)    03/30/2018-overnight oximetry- less than 88% for 43 minutes and 43 seconds, less than 89% for an hour and 36 minutes of 51 seconds, baseline SPO2 of 91%         Allergies  Allergen Reactions  . Acyclovir And Related   . Amoxicillin Hives  . Codeine Nausea And Vomiting  . Lorabid [Loracarbef] Hives  . Sulfa Antibiotics Hives  . Sulfur Hives    Immunization History  Administered Date(s) Administered  . Influenza, High Dose Seasonal PF 12/12/2016, 11/27/2017  . Pneumococcal Conjugate-13 10/16/2017   Needs Pneumovax 23 in August/2020  Past Medical History:  Diagnosis Date  . Arthritis   . Combined hyperlipidemia   . COPD (chronic obstructive pulmonary disease) ([place])   . Coronary artery spasm ([place])   . Dupuytren's contracture   . Dyspepsia   . Familial tremor   . Fatigue   . Fibromyalgia syndrome   . GERD (gastroesophageal reflux disease)   . Hypertension   . Hypothyroidism, acquired, autoimmune   . Multinodular goiter (nontoxic)   . Tachycardia   . Thyroiditis, autoimmune   . Wears glasses     Tobacco History: Social History   Tobacco Use  Smoking Status Former Smoker  . Packs/day: 0.50  . Years: 42.00  . Pack years: 21.00  . Types: Cigarettes  . Last attempt to quit: 02/21/2001  . Years since quitting: 17.1  Smokeless Tobacco Never Used   Counseling given: Not Answered  Continue to not smoke  Outpatient Encounter Medications as of 04/26/2018  Medication Sig  . albuterol (  PROVENTIL HFA;VENTOLIN HFA) 108 (90 Base) MCG/ACT inhaler Inhale 1-2 puffs into the lungs every 6 (six) hours as needed for wheezing or shortness of breath.  . Cyanocobalamin (VITAMIN B 12 PO) Take by mouth daily.  Marland Kitchen doxycycline (VIBRA-TABS) 100 MG tablet Take 1 tablet (100 mg total) by mouth 2 (two) times daily.  . DULoxetine (CYMBALTA) 30 MG capsule TAKE 3 CAPSULES BY MOUTH ONCE DAILY ONCE A DAY ORALLY 30  . Fluticasone-Umeclidin-Vilant (TRELEGY  ELLIPTA) 100-62.5-25 MCG/INH AEPB Inhale 1 puff into the lungs daily.  . furosemide (LASIX) 20 MG tablet TAKE 1 TABLET BY MOUTH EVERY DAY AS NEEDED FOR SWELLING  . hydrochlorothiazide (HYDRODIURIL) 25 MG tablet TAKE 1 TABLET BY MOUTH EVERY DAY IN THE MORNING  . losartan (COZAAR) 100 MG tablet Take 100 mg by mouth daily.  . Multiple Vitamin (MULTIVITAMIN) tablet Take 1 tablet by mouth daily.    Marland Kitchen omeprazole (PRILOSEC) 20 MG capsule Take 20 mg by mouth daily.  Marland Kitchen Respiratory Therapy Supplies (FLUTTER) DEVI Use device 2-3 times a day to break up congestion  . rosuvastatin (CRESTOR) 5 MG tablet Take 5 mg by mouth daily.  Marland Kitchen SYNTHROID 88 MCG tablet TAKE 1 TABLET (88 MCG TOTAL) BY MOUTH DAILY BEFORE BREAKFAST.  Marland Kitchen VITAMIN D, CHOLECALCIFEROL, PO Take 50,000 Units by mouth once a week.   . doxycycline (VIBRA-TABS) 100 MG tablet Take 1 tablet (100 mg total) by mouth 2 (two) times daily.  . predniSONE (DELTASONE) 10 MG tablet 4 tabs for 2 days, then 3 tabs for 2 days, 2 tabs for 2 days, then 1 tab for 2 days, then stop   No facility-administered encounter medications on file as of 04/26/2018.      Review of Systems  Review of Systems  Constitutional: Positive for fatigue. Negative for chills, fever and unexpected weight change.  HENT: Positive for postnasal drip. Negative for congestion, ear pain, sinus pressure and sinus pain.   Respiratory: Positive for cough (productive yellow mucous ), shortness of breath and wheezing. Negative for chest tightness.   Cardiovascular: Negative for chest pain and palpitations.  Gastrointestinal: Negative for diarrhea, nausea and vomiting.  Musculoskeletal: Negative for arthralgias.  Skin: Negative for color change.  Allergic/Immunologic: Negative for environmental allergies and food allergies.  Neurological: Negative for dizziness, light-headedness and headaches.  Psychiatric/Behavioral: Negative for dysphoric mood. The patient is not nervous/anxious.   All other  systems reviewed and are negative.    Physical Exam  BP 104/60 (BP Location: Left Arm, Patient Position: Sitting, Cuff Size: Normal)   Pulse (!) 101   Ht 5' 3.5" (1.613 m)   Wt 155 lb 6.4 oz (70.5 kg)   SpO2 95%   BMI 27.10 kg/m   Wt Readings from Last 5 Encounters:  04/26/18 155 lb 6.4 oz (70.5 kg)  04/22/18 158 lb 12.8 oz (72 kg)  04/09/18 159 lb 9.6 oz (72.4 kg)  03/15/18 157 lb 6.4 oz (71.4 kg)  03/01/18 158 lb 3.2 oz (71.8 kg)    Physical Exam  Constitutional: She is oriented to person, place, and time and well-developed, well-nourished, and in no distress. No distress.  HENT:  Head: Normocephalic and atraumatic.  Right Ear: Hearing, tympanic membrane, external ear and ear canal normal.  Left Ear: Hearing, tympanic membrane, external ear and ear canal normal.  Nose: Mucosal edema and rhinorrhea present. Right sinus exhibits no maxillary sinus tenderness and no frontal sinus tenderness. Left sinus exhibits no maxillary sinus tenderness and no frontal sinus tenderness.  Mouth/Throat: Uvula  is midline and oropharynx is clear and moist. No oropharyngeal exudate.  Eyes: Pupils are equal, round, and reactive to light.  Neck: Normal range of motion. Neck supple.  Cardiovascular: Normal rate, regular rhythm and normal heart sounds.  Pulmonary/Chest: Effort normal. No accessory muscle usage. No respiratory distress. She has no decreased breath sounds. She has wheezes (Expiratory wheeze). She has no rhonchi.  Musculoskeletal: Normal range of motion.        General: No edema.  Lymphadenopathy:    She has no cervical adenopathy.  Neurological: She is alert and oriented to person, place, and time. Gait normal.  Skin: Skin is warm and dry. She is not diaphoretic. No erythema.  Psychiatric: Mood, memory, affect and judgment normal.  Nursing note and vitals reviewed.     Lab Results:  CBC    Component Value Date/Time   WBC 14.0 (H) 02/28/2018 1732   RBC 3.93 02/28/2018 1732    HGB 11.6 (L) 02/28/2018 1732   HCT 37.0 02/28/2018 1732   PLT 382 02/28/2018 1732   MCV 94.1 02/28/2018 1732   MCH 29.5 02/28/2018 1732   MCHC 31.4 02/28/2018 1732   RDW 12.3 02/28/2018 1732   LYMPHSABS 2.9 02/28/2018 1732   MONOABS 1.2 (H) 02/28/2018 1732   EOSABS 0.3 02/28/2018 1732   BASOSABS 0.1 02/28/2018 1732    BMET    Component Value Date/Time   NA 136 02/28/2018 1732   K 3.5 02/28/2018 1732   CL 98 02/28/2018 1732   CO2 29 02/28/2018 1732   GLUCOSE 133 (H) 02/28/2018 1732   BUN 12 02/28/2018 1732   CREATININE 0.63 02/28/2018 1732   CREATININE 0.59 (L) 02/22/2016 0001   CALCIUM 9.5 04/08/2018 0000   CALCIUM 9.7 04/06/2012 1019   GFRNONAA >60 02/28/2018 1732   GFRAA >60 02/28/2018 1732    BNP    Component Value Date/Time   BNP 25.5 02/28/2018 1732    ProBNP No results found for: PROBNP    Assessment & Plan:    Stage 3 severe COPD by GOLD classification ([place]) Assessment: mMRC 3 today End expiratory wheeze on exam today Maintained on Trelegy Ellipta Using [facility] rescue inhaler 2 times daily Productive cough with yellow mucus as well as increased fatigue and dyspnea  Plan: Continue doxycycline, will extend doxycycline prescription to 10 days total sent an additional 3 days today Prednisone taper Continue Trelegy Ellipta Continue rescue inhaler as needed Continue flutter valve Keep scheduled follow-up with our office Contact our office later this week to update on how they are doing Needs Pneumovax 23 in July/2020   Bronchiectasis with acute exacerbation [facility]) Assessment: December/2018 as well as December/2019 showing mild cylindrical bronchiectasis Continued productive cough with yellow sputum Increased fatigue and dyspnea Maintained on flutter valve since last office visit Currently taking doxycycline Noisy chest on exam today  Plan: Continue doxycycline for total of 10 days Continue flutter valve Prednisone taper Keep scheduled follow-up  with our office  Nocturnal hypoxemia due to emphysema [facility]) Assessment: February/2019 pulmonary function test showing DLCO 52 February/2020 overnight oximetry showing less than 88% for 43 minutes and 43 seconds Maintained on 2 L of O2 at night  Plan: Continue oxygen therapy as prescribed 2 L at night Can use nasal saline gel     Lauraine Rinne, NP 04/26/2018   This appointment was 30 min long with over 50% of the time in direct face-to-face patient care, assessment, plan of care, and follow-up.

## 2018-04-26 NOTE — Assessment & Plan Note (Signed)
Assessment: February/2019 pulmonary function test showing DLCO 52 February/2020 overnight oximetry showing less than 88% for 43 minutes and 43 seconds Maintained on 2 L of O2 at night  Plan: Continue oxygen therapy as prescribed 2 L at night Can use nasal saline gel

## 2018-04-26 NOTE — Progress Notes (Signed)
Discussed results with patient in office.  Nothing further is needed at this time.  Derren Suydam FNP  

## 2018-04-26 NOTE — Patient Instructions (Addendum)
Chest x-ray today is stable  Prednisone 10mg  tablet  >>>4 tabs for 2 days, then 3 tabs for 2 days, 2 tabs for 2 days, then 1 tab for 2 days, then stop >>>take with food  >>>take in the morning   Doxycycline >>> 1 100 mg tablet every 12 hours for 3 more days - total  >>>take with food  >>>wear sunscreen   Trelegy Ellipta  >>> 1 puff daily in the morning >>>rinse mouth out after use  >>> This inhaler contains 3 medications that help manage her respiratory status, contact our office if you cannot afford this medication or unable to remain on this medication  Bronchiectasis: This is the medical term which indicates that you have damage, dilated airways making you more susceptible to respiratory infection. Use a flutter valve 10 breaths twice a day or 4 to 5 breaths 4-5 times a day to help clear mucus out Let us know if you have cough with change in mucus color or fevers or chills.  At that point you would need an antibiotic. Maintain a healthy nutritious diet, eating whole foods Take your medications as prescribed     Keep scheduled follow-up with our office.  Follow-up with our office sooner if you have any questions.   Please contact our office on 04/29/2018 to update me on how you are doing.   >>>You can asked to speak with Kathline Magic, or Aaron Edelman  It is flu season:   >>> Best ways to protect herself from the flu: Receive the yearly flu vaccine, practice good hand hygiene washing with soap and also using hand sanitizer when available, eat a nutritious meals, get adequate rest, hydrate appropriately   Please contact the office if your symptoms worsen or you have concerns that you are not improving.   Thank you for choosing [facility] for your healthcare, and for allowing [place] to partner with you on your healthcare journey. I am thankful to be able to provide care to you today.   [name] FNP-C

## 2018-04-29 ENCOUNTER — Telehealth: Payer: Self-pay | Admitting: Pulmonary Disease

## 2018-04-29 NOTE — Telephone Encounter (Signed)
Thank you for the update I am glad that she is feeling some improved symptoms.  I would continue forward on the same treatment plan.  Make sure that you are resting, hydrating well, wearing your oxygen at night.  Can update [place] with symptoms sometime next week.[name], FNP

## 2018-04-29 NOTE — Telephone Encounter (Signed)
Called and spoke with Shelby Lin.  Shelby Lin stated that she saw [name], NP, 04/26/18, and was told to call him back to give a update on how she feels.  Shelby Lin stated that she feels better then Monday, but on a scale of 0-10, she feels a 2.  Shelby Lin stated that she still has a productive cough, thick, creamy white phlegm.  She stated that she has taken 3 days of prednisone,and antibiotic.  She stated that she checks her sats every morning. Yesterday sats were 94-95%, this morning 89-90%.  Shelby Lin stated she didn't know if she needed something else, or continue what she is doing.  Message routed to [name], NP

## 2018-04-29 NOTE — Telephone Encounter (Signed)
Called and spoke with patient. She verbalized understanding of Brain's rec and will call next week with a symptom update.  Nothing further needed at this time.

## 2018-04-30 ENCOUNTER — Telehealth: Payer: Self-pay | Admitting: Pulmonary Disease

## 2018-04-30 NOTE — Telephone Encounter (Signed)
Primary Pulmonologist: Ramaswamy  Last office visit and with whom: [name] What do we see them for (pulmonary problems): COPD Last OV assessment/plan: 04/26/2018   Assessment & Plan Note by Lauraine Rinne, NP at 04/26/2018 4:18 PM  Author: Lauraine Rinne, NP Author Type: Nurse Practitioner Filed: 04/26/2018 4:20 PM  Note Status: Written Cosign: Cosign Not Required Encounter Date: 04/26/2018  Problem: Bronchiectasis with acute exacerbation [facility])  Editor: Lauraine Rinne, NP (Nurse Practitioner)    Assessment: December/2018 as well as December/2019 showing mild cylindrical bronchiectasis Continued productive cough with yellow sputum Increased fatigue and dyspnea Maintained on flutter valve since last office visit Currently taking doxycycline Noisy chest on exam today  Plan: Continue doxycycline for total of 10 days Continue flutter valve Prednisone taper Keep scheduled follow-up with our office     Was appointment offered to patient (explain)?  Wants to see what Aaron Edelman says   Reason for call:  Spoke with pt, she states she has vertigo and couldn't sleep much last night. She hasn' tried anything for the symptoms and states she has had this in the past but unsure why this started now. She wanted some medication called in but I advised her that this was a new problem and that it would be hard to evaluate this over the phone. She stated her PCP doesn't have any appointments today. Aaron Edelman please advise.

## 2018-04-30 NOTE — Telephone Encounter (Signed)
I am sorry to hear the patient is having the symptoms.  I highly doubt that her nighttime oxygen use is causing her to have vertigo.  If the patient would like she can not wear the oxygen for a few days at night.  To see if this helps with symptomatic relief.  I still recommend that she schedule a follow-up visit with primary care to discuss vertigo if the symptoms persist.  [name], FNP

## 2018-04-30 NOTE — Telephone Encounter (Signed)
Called the patient and advised her of the recommendations below. Patient agreed and stated she will call her PCP if vertigo continues.  Nothing further needed at this time.

## 2018-05-03 ENCOUNTER — Ambulatory Visit: Payer: Medicare Other | Admitting: Cardiology

## 2018-05-12 ENCOUNTER — Ambulatory Visit: Payer: Medicare Other

## 2018-06-18 ENCOUNTER — Other Ambulatory Visit (INDEPENDENT_AMBULATORY_CARE_PROVIDER_SITE_OTHER): Payer: Self-pay | Admitting: "Endocrinology

## 2018-06-18 DIAGNOSIS — E034 Atrophy of thyroid (acquired): Secondary | ICD-10-CM

## 2018-06-21 ENCOUNTER — Ambulatory Visit: Payer: Medicare Other | Admitting: Pulmonary Disease

## 2018-06-21 ENCOUNTER — Telehealth: Payer: Self-pay | Admitting: Cardiology

## 2018-06-21 NOTE — Telephone Encounter (Signed)
Spoke with patient who is aware we will contact her by phone for her appt as scheduled.

## 2018-06-21 NOTE — Telephone Encounter (Signed)
Pt consents to a phone visit only,  She does not have a smart phone.

## 2018-06-21 NOTE — Telephone Encounter (Signed)
New Message    Pt said she doesn't have a smart phone and has a computer. If she cant do the visit over the computer she said a Phone visit will be fine   Please call

## 2018-06-23 ENCOUNTER — Ambulatory Visit (INDEPENDENT_AMBULATORY_CARE_PROVIDER_SITE_OTHER): Payer: Medicare Other | Admitting: Nurse Practitioner

## 2018-06-23 ENCOUNTER — Encounter: Payer: Self-pay | Admitting: Nurse Practitioner

## 2018-06-23 ENCOUNTER — Ambulatory Visit: Payer: Medicare Other | Admitting: Pulmonary Disease

## 2018-06-23 ENCOUNTER — Other Ambulatory Visit: Payer: Self-pay

## 2018-06-23 DIAGNOSIS — J449 Chronic obstructive pulmonary disease, unspecified: Secondary | ICD-10-CM

## 2018-06-23 DIAGNOSIS — G4736 Sleep related hypoventilation in conditions classified elsewhere: Secondary | ICD-10-CM

## 2018-06-23 DIAGNOSIS — J479 Bronchiectasis, uncomplicated: Secondary | ICD-10-CM | POA: Diagnosis not present

## 2018-06-23 DIAGNOSIS — J439 Emphysema, unspecified: Secondary | ICD-10-CM | POA: Diagnosis not present

## 2018-06-23 NOTE — Assessment & Plan Note (Signed)
Stage 3 severe COPD by GOLD classification ([place]) Assessment: Overall stable, much improved since last visit No cough or wheeze Maintained on Trelegy Ellipta Using [facility] rescue inhaler 2 times daily Occasional shortness of breath with anxiety or exertion  Plan: Continue Trelegy Ellipta Continue rescue inhaler as needed Continue flutter valve Needs Pneumovax 23 in July/2020  Patient Instructions  COPD: Trelegy Ellipta  >>> 1 puff daily in the morning >>>rinse mouth out after use  >>> This inhaler contains 3 medications that help manage her respiratory status, contact our office if you cannot afford this medication or unable to remain on this medication Continue rescue inhaler as needed  Bronchiectasis: This is the medical term which indicates that you have damage, dilated airways making you more susceptible to respiratory infection. Use a flutter valve 10 breaths twice a day or 4 to 5 breaths 4-5 times a day to help clear mucus out Let us know if you have cough with change in mucus color or fevers or chills.  At that point you would need an antibiotic. Maintain a healthy nutritious diet, eating whole foods Take your medications as prescribed   Nocturnal hypoxemia due to emphysema: Continue oxygen therapy as prescribed 2 L at night Can use nasal saline gel  Will order test for covid-19 antibody screening per patient request after recent illness  Follow up: Follow up with [name] in 4 months or sooner if needed

## 2018-06-23 NOTE — Assessment & Plan Note (Signed)
Assessment: February/2019 pulmonary function test showing DLCO 52 February/2020 overnight oximetry showing less than 88% for 43 minutes and 43 seconds Maintained on 2 L of O2 at night  Plan: Continue oxygen therapy as prescribed 2 L at night Can use nasal saline gel  Patient Instructions  COPD: Trelegy Ellipta  >>> 1 puff daily in the morning >>>rinse mouth out after use  >>> This inhaler contains 3 medications that help manage her respiratory status, contact our office if you cannot afford this medication or unable to remain on this medication Continue rescue inhaler as needed  Bronchiectasis: This is the medical term which indicates that you have damage, dilated airways making you more susceptible to respiratory infection. Use a flutter valve 10 breaths twice a day or 4 to 5 breaths 4-5 times a day to help clear mucus out Let us know if you have cough with change in mucus color or fevers or chills.  At that point you would need an antibiotic. Maintain a healthy nutritious diet, eating whole foods Take your medications as prescribed   Nocturnal hypoxemia due to emphysema: Continue oxygen therapy as prescribed 2 L at night Can use nasal saline gel  Will order test for covid-19 antibody screening per patient request after recent illness  Follow up: Follow up with [name] in 4 months or sooner if needed

## 2018-06-23 NOTE — Patient Instructions (Addendum)
COPD: Trelegy Ellipta  >>> 1 puff daily in the morning >>>rinse mouth out after use  >>> This inhaler contains 3 medications that help manage her respiratory status, contact our office if you cannot afford this medication or unable to remain on this medication Continue rescue inhaler as needed  Bronchiectasis: This is the medical term which indicates that you have damage, dilated airways making you more susceptible to respiratory infection. Use a flutter valve 10 breaths twice a day or 4 to 5 breaths 4-5 times a day to help clear mucus out Let us know if you have cough with change in mucus color or fevers or chills.  At that point you would need an antibiotic. Maintain a healthy nutritious diet, eating whole foods Take your medications as prescribed   Nocturnal hypoxemia due to emphysema: Continue oxygen therapy as prescribed 2 L at night Can use nasal saline gel  Will order test for covid-19 antibody screening per patient request after recent illness  Follow up: Follow up with [name] in 4 months or sooner if needed

## 2018-06-23 NOTE — Progress Notes (Signed)
Virtual Visit via Telephone Note  I connected with Shelby Lin on 06/23/18 at  9:00 AM EDT by telephone and verified that I am speaking with the correct person using two identifiers.   I discussed the limitations, risks, security and privacy concerns of performing an evaluation and management service by telephone and the availability of in person appointments. I also discussed with the patient that there may be a patient responsible charge related to this service. The patient expressed understanding and agreed to proceed.   History of Present Illness: 78 year old female former smoker followed in our office for gold 3 COPD  PMH: Hypothyroidism due to Hashimoto's thyroiditis (managed by endocrinology Dr. Tobe Lin) Smoker/ Smoking History: Former smoker.  Quit 2002.  21-pack-year smoking history. Maintenance: Trelegy Ellipta Pt of: Dr. Chase Lin   Patient has a follow-up tele-visit today.  She was last seen by Shelby Lin on 04/26/2018 for COPD and bronchiectasis exacerbation.  Patient was given a round of doxycycline and prednisone taper.  She states that she is much improved she has been doing well since her last visit.  Denies any significant cough or wheezing.  She states that she does occasionally get short of breath with anxiety or overexertion.  Patient is compliant with Trelegy Ellipta inhaler.  Patient uses her rescue inhaler occasionally as needed.  She has not needed to use it recently.  She reports that she has been adherent to using her flutter valve.  She has returned to her normal daily routine and is active.  She did take her vital signs before her call this morning and her vital signs were normal.  Her oxygen level was 94% on room air.  He does use oxygen at 2 L nasal cannula at night only.  She has been self isolating and has not left the house for anything over the past couple months.  She is inquiring today about getting the COVID-19 antibody test since she was sick at the beginning  of March. Denies f/c/s, n/v/d, hemoptysis, PND, leg swelling.    Observations/Objective:  Today's Vitals   06/23/18 0910  BP: 139/78  Pulse: (!) 104  SpO2: 94%  Weight: 151 lb (68.5 kg)   Body mass index is 26.33 kg/m.   02/28/2018-chest x-ray- COPD changes without acute infiltrate, emphysematous and central bronchitic changes consistent with COPD  01/28/2018-CT chest without contrast-right middle lobe small solid pulmonary nodules are stable decreasing comparison at 02/11/2017 chest CT, considered benign, stable minimal scattered cylindrical bronchiectasis medial upper lobes and medial right middle lobe  02/11/2017-CT chest high-res- minimal scattered cylindrical bronchiectasis with associated minimal tree-in-bud opacity in the medial upper lobes and medial right middle lobes, findings could be consistent of MAI >>>Mild to moderate centrilobular emphysema with mild diffuse bronchial wall thickening, 2 small solid pulmonary nodules in right lung largest with average diameter 5 mm in the right middle lobe  04/01/2017-pulmonary function test- FVC 1.69 (61% predicted), postbronchodilator ratio 48, postbronchodilator FEV1 46, positive bronchodilator response, mid flow reversibility after bronchodilator, DLCO 52 >>>Severe obstructive airways disease >>>Patient did take Incruse 1 puff at 8 AM prior to testing  03/30/2018-overnight oximetry- less than 88% for 43 minutes and 43 seconds, less than 89% for an hour and 36 minutes of 51 seconds, baseline SPO2 of 91%  PFT Results Latest Ref Rng & Units 04/01/2017  FVC-Pre L 1.69  FVC-Predicted Pre % 61  FVC-Post L 2.02  FVC-Predicted Post % 73  Pre FEV1/FVC % % 45  Post FEV1/FCV % % 48  FEV1-Pre L 0.76  FEV1-Predicted Pre % 36  FEV1-Post L 0.96  DLCO UNC% % 52  DLCO COR %Predicted % 84     Assessment and Plan:  Stage 3 severe COPD by GOLD classification (HCC) Assessment: Overall stable, much improved since last visit No cough or  wheeze Maintained on Trelegy Ellipta Using [facility] rescue inhaler 2 times daily Occasional shortness of breath with anxiety or exertion  Plan: Continue Trelegy Ellipta Continue rescue inhaler as needed Continue flutter valve Needs Pneumovax 23 in July/2020   Bronchiectasis with acute exacerbation [facility]) Assessment: December/2018 as well as December/2019 showing mild cylindrical bronchiectasis Maintained on flutter valve since last office visit  Plan: Continue flutter valve  Nocturnal hypoxemia due to emphysema [facility]) Assessment: February/2019 pulmonary function test showing DLCO 52 February/2020 overnight oximetry showing less than 88% for 43 minutes and 43 seconds Maintained on 2 L of O2 at night  Plan: Continue oxygen therapy as prescribed 2 L at night Can use nasal saline gel  Patient Instructions  COPD: Trelegy Ellipta  >>> 1 puff daily in the morning >>>rinse mouth out after use  >>> This inhaler contains 3 medications that help manage her respiratory status, contact our office if you cannot afford this medication or unable to remain on this medication Continue rescue inhaler as needed  Bronchiectasis: This is the medical term which indicates that you have damage, dilated airways making you more susceptible to respiratory infection. Use a flutter valve 10 breaths twice a day or 4 to 5 breaths 4-5 times a day to help clear mucus out Let us know if you have cough with change in mucus color or fevers or chills.  At that point you would need an antibiotic. Maintain a healthy nutritious diet, eating whole foods Take your medications as prescribed   Nocturnal hypoxemia due to emphysema: Continue oxygen therapy as prescribed 2 L at night Can use nasal saline gel  Will order test for covid-19 antibody screening per patient request after recent illness  Follow up: Follow up with Dr. Chase Lin in 4 months or sooner if needed      Follow Up  Instructions:  Follow up with Dr. Chase Lin in 4 months or sooner if needed   I discussed the assessment and treatment plan with the patient. The patient was provided an opportunity to ask questions and all were answered. The patient agreed with the plan and demonstrated an understanding of the instructions.   The patient was advised to call back or seek an in-person evaluation if the symptoms worsen or if the condition fails to improve as anticipated.  I provided 23 minutes of non-face-to-face time during this encounter.   Fenton Foy, NP

## 2018-06-23 NOTE — Assessment & Plan Note (Addendum)
Assessment: December/2018 as well as December/2019 showing mild cylindrical bronchiectasis Maintained on flutter valve since last office visit  Plan: Continue flutter valve  Patient Instructions  COPD: Trelegy Ellipta  >>> 1 puff daily in the morning >>>rinse mouth out after use  >>> This inhaler contains 3 medications that help manage her respiratory status, contact our office if you cannot afford this medication or unable to remain on this medication Continue rescue inhaler as needed  Bronchiectasis: This is the medical term which indicates that you have damage, dilated airways making you more susceptible to respiratory infection. Use a flutter valve 10 breaths twice a day or 4 to 5 breaths 4-5 times a day to help clear mucus out Let us know if you have cough with change in mucus color or fevers or chills.  At that point you would need an antibiotic. Maintain a healthy nutritious diet, eating whole foods Take your medications as prescribed   Nocturnal hypoxemia due to emphysema: Continue oxygen therapy as prescribed 2 L at night Can use nasal saline gel  Will order test for covid-19 antibody screening per patient request after recent illness  Follow up: Follow up with [name] in 4 months or sooner if needed

## 2018-06-28 ENCOUNTER — Ambulatory Visit: Payer: Medicare Other | Admitting: Cardiology

## 2018-06-28 ENCOUNTER — Encounter: Payer: Self-pay | Admitting: Cardiology

## 2018-06-28 ENCOUNTER — Telehealth (INDEPENDENT_AMBULATORY_CARE_PROVIDER_SITE_OTHER): Payer: Medicare Other | Admitting: Cardiology

## 2018-06-28 ENCOUNTER — Other Ambulatory Visit: Payer: Self-pay

## 2018-06-28 VITALS — BP 103/67 | HR 101 | Ht 63.5 in | Wt 152.0 lb

## 2018-06-28 DIAGNOSIS — I2510 Atherosclerotic heart disease of native coronary artery without angina pectoris: Secondary | ICD-10-CM

## 2018-06-28 DIAGNOSIS — I2584 Coronary atherosclerosis due to calcified coronary lesion: Secondary | ICD-10-CM

## 2018-06-28 DIAGNOSIS — I700 Atherosclerosis of aorta: Secondary | ICD-10-CM

## 2018-06-28 DIAGNOSIS — Z79899 Other long term (current) drug therapy: Secondary | ICD-10-CM

## 2018-06-28 DIAGNOSIS — E782 Mixed hyperlipidemia: Secondary | ICD-10-CM

## 2018-06-28 MED ORDER — ASPIRIN EC 81 MG PO TBEC: 81.0000 mg | DELAYED_RELEASE_TABLET | Freq: Every day | ORAL | Status: AC

## 2018-06-28 MED ORDER — ROSUVASTATIN CALCIUM 20 MG PO TABS: 20.0000 mg | ORAL_TABLET | Freq: Every day | ORAL | 3 refills | Status: AC

## 2018-06-28 NOTE — Progress Notes (Signed)
Virtual Visit via Telephone Note   This visit type was conducted due to national recommendations for restrictions regarding the COVID-19 Pandemic (e.g. social distancing) in an effort to limit this patient's exposure and mitigate transmission in our community.  Due to her co-morbid illnesses, this patient is at least at moderate risk for complications without adequate follow up.  This format is felt to be most appropriate for this patient at this time.  The patient did not have access to video technology/had technical difficulties with video requiring transitioning to audio format only (telephone).  All issues noted in this document were discussed and addressed.  No physical exam could be performed with this format.  Please refer to the patient's chart for her  consent to telehealth for [facility].   Date:  06/28/2018   ID:  Shelby Lin, DOB 03/16/1940, MRN [id number]  Patient Location: Home Provider Location: Home  PCP:  Carol Ada, MD  Cardiologist:  Candee Furbish, MD  Electrophysiologist:  None   Evaluation Performed:  New Patient Evaluation  Chief Complaint: Evaluation coronary artery calcium seen on CT scan for COPD  History of Present Illness:    Shelby Lin is a 78 y.o. female with COPD followed by pulmonary.  A CT scan was performed and demonstrated coronary artery calcification.  Back in 2014 she underwent a left heart catheterization that showed no significant flow-limiting coronary artery disease.  She was having spells of jaw pain relieved with Tums.  Her father had GERD and ended up having coronary disease and dying suddenly of a heart attack.  She has a history of breast cancer.  Family history of coronary artery disease as below.  Has been taking low-dose rosuvastatin.  She denies any anginal symptoms such as chest pain or chest tightness.  She does feel shortness of breath with her COPD but this is not unusual.  The patient does not have symptoms concerning  for COVID-19 infection (fever, chills, cough, or new shortness of breath).    Past Medical History:  Diagnosis Date  . Anxiety   . Arthritis   . Asthma in child   . Atherosclerotic heart disease of native coronary artery without angina pectoris   . Cataracts, bilateral   . Combined hyperlipidemia   . Constipation, unspecified   . COPD (chronic obstructive pulmonary disease) ([place])   . Coronary artery spasm ([place])   . Diabetic neuropathy ([place])   . DM (diabetes mellitus) with complications ([place])   . Dupuytren's contracture   . Dyspepsia   . Familial tremor   . Fatigue   . Fibromyalgia syndrome   . GERD (gastroesophageal reflux disease)   . Hashimoto's thyroiditis   . Hiatal hernia   . History of colon polyps   . Hx of Hashimoto thyroiditis   . Hypercholesteremia   . Hypertension   . Hypothyroidism, acquired, autoimmune   . IBS (irritable bowel syndrome)   . Meniere's disease of left ear   . Migraine without aura and without status migrainosus, not intractable   . Multinodular goiter (nontoxic)   . Nonexudative macular degeneration   . Raynaud's syndrome without gangrene   . Situational stress   . Tachycardia   . Thyroiditis, autoimmune   . Ulcerative colitis, unspecified, without complications ([place])   . Vitamin B 12 deficiency   . Vitamin D deficiency   . Wears glasses    Past Surgical History:  Procedure Laterality Date  . BREAST EXCISIONAL BIOPSY Left    benign  .  BREAST EXCISIONAL BIOPSY Right    milk gland removed  . BREAST LUMPECTOMY  1990   left  . BREAST LUMPECTOMY WITH NEEDLE LOCALIZATION Right 02/28/2013   Procedure: RIGHT BREAST NEEDLE LOCALIZATION LUMPECTOMY ;  Surgeon: Edward Jolly, MD;  Location: [facility];  Service: General;  Laterality: Right;  . CHOLECYSTECTOMY  2008   lapcholi  . DILATION AND CURETTAGE OF UTERUS    . LEFT HEART CATHETERIZATION WITH CORONARY ANGIOGRAM N/A 01/26/2013   Procedure: LEFT HEART CATHETERIZATION WITH  CORONARY ANGIOGRAM;  Surgeon: Candee Furbish, MD;  [facility] C[facility] LAB;  Service: Cardiovascular;  Laterality: N/A;  . PARTIAL HYSTERECTOMY  1971  . TONSILLECTOMY       Current Meds  Medication Sig  . albuterol (PROVENTIL HFA;VENTOLIN HFA) 108 (90 Base) MCG/ACT inhaler Inhale 1-2 puffs into the lungs every 6 (six) hours as needed for wheezing or shortness of breath.  . Cyanocobalamin (VITAMIN B 12 PO) Take by mouth daily.  . DULoxetine (CYMBALTA) 30 MG capsule TAKE 3 CAPSULES BY MOUTH ONCE DAILY ONCE A DAY ORALLY 30  . Fluticasone-Umeclidin-Vilant (TRELEGY ELLIPTA) 100-62.5-25 MCG/INH AEPB Inhale 1 puff into the lungs daily.  . furosemide (LASIX) 20 MG tablet TAKE 1 TABLET BY MOUTH EVERY DAY AS NEEDED FOR SWELLING  . hydrochlorothiazide (HYDRODIURIL) 25 MG tablet TAKE 1 TABLET BY MOUTH EVERY DAY IN THE MORNING  . losartan (COZAAR) 100 MG tablet Take 100 mg by mouth daily.  . meclizine (ANTIVERT) 25 MG tablet 1/2 to 1 tablet by mouth every 8 hrs as needed for diziness  . Multiple Vitamin (MULTIVITAMIN) tablet Take 1 tablet by mouth daily.    Marland Kitchen omeprazole (PRILOSEC) 20 MG capsule Take 20 mg by mouth daily.  . predniSONE (DELTASONE) 10 MG tablet 4 tabs for 2 days, then 3 tabs for 2 days, 2 tabs for 2 days, then 1 tab for 2 days, then stop  . Respiratory Therapy Supplies (FLUTTER) DEVI Use device 2-3 times a day to break up congestion  . rosuvastatin (CRESTOR) 20 MG tablet Take 1 tablet (20 mg total) by mouth daily.  Marland Kitchen SYNTHROID 88 MCG tablet TAKE 1 TABLET (88 MCG TOTAL) BY MOUTH DAILY BEFORE BREAKFAST.  Marland Kitchen VITAMIN D, CHOLECALCIFEROL, PO Take 50,000 Units by mouth once a week.   . [DISCONTINUED] rosuvastatin (CRESTOR) 5 MG tablet Take 5 mg by mouth daily.     Allergies:   Acyclovir and related; Allegra [fexofenadine]; Amoxicillin; Codeine; Enalapril maleate; Keflex [cephalexin]; Levofloxacin; Lorabid [loracarbef]; Sulfa antibiotics; and Sulfur   Social History   Tobacco Use  . Smoking status:  Former Smoker    Packs/day: 0.50    Years: 42.00    Pack years: 21.00    Types: Cigarettes    Last attempt to quit: 02/21/2001    Years since quitting: 17.3  . Smokeless tobacco: Never Used  Substance Use Topics  . Alcohol use: No  . Drug use: No     Family Hx: The patient's family history includes Cancer in her maternal uncle, mother, and paternal aunt; Diabetes in her sister; Heart attack in her father; Hypertension in her mother and sister; Stroke in her mother and sister; Sudden death in her father. There is no history of Thyroid disease.  ROS:   Please see the history of present illness.    Denies any fevers chills nausea vomiting cough syncope bleeding orthopnea myalgias All other systems reviewed and are negative.   Prior CV studies:   The following studies were reviewed  today:  Cardiac catheterization 01/26/2013-no angiographically significant CAD, normal EF, left ventricular end-diastolic pressure 14.  Labs/Other Tests and Data Reviewed:    EKG:  An ECG dated 03/01/2018 was personally reviewed today and demonstrated:  Sinus rhythm with nonspecific ST-T wave changes  Recent Labs: 02/28/2018: B Natriuretic Peptide 25.5; BUN 12; Creatinine, Ser 0.63; Hemoglobin 11.6; Platelets 382; Potassium 3.5; Sodium 136 04/08/2018: TSH 1.02   Recent Lipid Panel No results found for: CHOL, TRIG, HDL, CHOLHDL, LDLCALC, LDLDIRECT  Wt Readings from Last 3 Encounters:  06/28/18 152 lb (68.9 kg)  06/23/18 151 lb (68.5 kg)  04/26/18 155 lb 6.4 oz (70.5 kg)     Objective:    Vital Signs:  BP 103/67   Pulse (!) 101   Ht 5' 3.5" (1.613 m)   Wt 152 lb (68.9 kg)   SpO2 93%   BMI 26.50 kg/m    VITAL SIGNS:  reviewed  She is alert and oriented, able to complete full sentences without difficulty on the phone.  ASSESSMENT & PLAN:    Coronary artery calcification/ Aortic atherosclerosis - Mid LAD calcification noted - Prior cardiac catheterization in 2014 showed no obstructive coronary  artery disease. - I will go ahead and intensify her rosuvastatin from 5 mg daily up to 20 mg daily, high intensity dose.  I will check ALT and lipid panel and 2 months. -Start aspirin 81 mg once a day.  Family history of coronary artery disease -Sister had CABG, CVA, mother had angina and A. fib.  COPD -Quit smoking in 2002 after 21 pack years.  Uses 2 L nasal cannula at night.  COVID-19 Education: The signs and symptoms of COVID-19 were discussed with the patient and how to seek care for testing (follow up with PCP or arrange E-visit).  The importance of social distancing was discussed today.  Time:   Today, I have spent 40 minutes with the patient with telehealth technology discussing the above problems and review of prior medical records including cardiac catheterization.     Medication Adjustments/Labs and Tests Ordered: Current medicines are reviewed at length with the patient today.  Concerns regarding medicines are outlined above.   Tests Ordered: Orders Placed This Encounter  Procedures  . ALT  . Lipid panel    Medication Changes: Meds ordered this encounter  Medications  . rosuvastatin (CRESTOR) 20 MG tablet    Sig: Take 1 tablet (20 mg total) by mouth daily.    Dispense:  90 tablet    Refill:  3  . aspirin EC 81 MG tablet    Sig: Take 1 tablet (81 mg total) by mouth daily.    Disposition:  Follow up in 6 month(s)  Signed, Candee Furbish, MD  06/28/2018 3:53 PM    [facility]

## 2018-06-28 NOTE — Patient Instructions (Addendum)
Medication Instructions:  Please start Aspirin 81 mg by mouth daily.  Increase your Rosuvastatin to 20 mg daily.  A new prescription has been sent into [facility] for you.  Continue all other medications as listed.  If you need a refill on your cardiac medications before your next appointment, please call your pharmacy.   Lab work: Please have fasting lab work in 2 months as scheduled.  (Lipid, ALT) If you have labs (blood work) drawn today and your tests are completely normal, you will receive your results only by: Marland Kitchen MyChart Message (if you have MyChart) OR . A paper copy in the mail If you have any lab test that is abnormal or we need to change your treatment, we will call you to review the results.  Follow-Up: Follow up in 6 months with Truitt Merle, NP.  You will receive a letter in the mail 2 months before you are due.  Please call us when you receive this letter to schedule your follow up appointment.  Thank you for choosing [facility]!!

## 2018-06-29 ENCOUNTER — Telehealth: Payer: Self-pay | Admitting: Pulmonary Disease

## 2018-06-29 ENCOUNTER — Telehealth: Payer: Self-pay | Admitting: Cardiology

## 2018-06-29 NOTE — Telephone Encounter (Signed)
All instructions, appointments were sent to patient via [place].  She is aware.  Also aware rx was sent into [facility] as requested.  All questions were answered and patient thanked me for my time and information.

## 2018-06-29 NOTE — Telephone Encounter (Signed)
Spoke with patient. She stated that she had recently washed her flutter valve and the settings on the bottom were turned in the process. She wanted to know if we kept records of what settings patients are using. Advised her that we do not keep track of settings. I did explain to her how to find the correct setting again by blowing into the device at each groove until she finds the one with the best resistance.   She verbalized understanding and stated that she will try this.   Nothing further needed at time of call.

## 2018-06-29 NOTE — Telephone Encounter (Signed)
New Message    Pt says the nurse was suppose to call her after the virtual visit  She said the Dr is increasing her medication Rosuvastatin    Please call

## 2018-08-31 ENCOUNTER — Other Ambulatory Visit: Payer: Medicare Other

## 2018-08-31 ENCOUNTER — Other Ambulatory Visit: Payer: Self-pay

## 2018-08-31 DIAGNOSIS — E782 Mixed hyperlipidemia: Secondary | ICD-10-CM

## 2018-08-31 DIAGNOSIS — Z79899 Other long term (current) drug therapy: Secondary | ICD-10-CM

## 2018-08-31 LAB — LIPID PANEL
Chol/HDL Ratio: 1.9 ratio (ref 0.0–4.4)
Cholesterol, Total: 134 mg/dL (ref 100–199)
HDL: 72 mg/dL (ref >39)
LDL Calculated: 41 mg/dL (ref 0–99)
Triglycerides: 103 mg/dL (ref 0–149)
VLDL Cholesterol Cal: 21 mg/dL (ref 5–40)

## 2018-08-31 LAB — ALT: ALT: 13 IU/L (ref 0–32)

## 2018-09-30 ENCOUNTER — Telehealth: Payer: Self-pay | Admitting: Pulmonary Disease

## 2018-09-30 MED ORDER — TRELEGY ELLIPTA 100-62.5-25 MCG/INH IN AEPB: 1.0000 | INHALATION_SPRAY | Freq: Every day | RESPIRATORY_TRACT | 3 refills | Status: DC

## 2018-09-30 NOTE — Telephone Encounter (Signed)
Called and spoke with pt. Verified preferred pharmacy that pt wanted the Rx to be sent to and sent Rx to pharmacy for pt. Also stated to pt that she was due for an appt and pt verbalized understanding. Pt scheduled for an appt with BM 8/26. Nothing further needed.

## 2018-10-06 ENCOUNTER — Other Ambulatory Visit: Payer: Self-pay

## 2018-10-06 ENCOUNTER — Ambulatory Visit
Admission: RE | Admit: 2018-10-06 | Discharge: 2018-10-06 | Disposition: A | Discharge status: Home / Self Care | Payer: Medicare Other | Source: Ambulatory Visit | Attending: Family Medicine | Admitting: Family Medicine

## 2018-10-06 DIAGNOSIS — Z1231 Encounter for screening mammogram for malignant neoplasm of breast: Secondary | ICD-10-CM

## 2018-10-06 LAB — TSH: TSH: 0.34 mIU/L — ABNORMAL LOW (ref 0.40–4.50)

## 2018-10-06 LAB — T4, FREE: Free T4: 1.2 ng/dL (ref 0.8–1.8)

## 2018-10-06 LAB — HEMOGLOBIN A1C
Hgb A1c MFr Bld: 6.3 % of total Hgb — ABNORMAL HIGH (ref <5.7)
Mean Plasma Glucose: 134 (calc)
eAG (mmol/L): 7.4 (calc)

## 2018-10-06 LAB — T3, FREE: T3, Free: 3 pg/mL (ref 2.3–4.2)

## 2018-10-06 LAB — VITAMIN D 25 HYDROXY (VIT D DEFICIENCY, FRACTURES): Vit D, 25-Hydroxy: 43 ng/mL (ref 30–100)

## 2018-10-06 LAB — PTH, INTACT AND CALCIUM
Calcium: 9.9 mg/dL (ref 8.6–10.4)
PTH: 18 pg/mL (ref 14–64)

## 2018-10-08 ENCOUNTER — Other Ambulatory Visit: Payer: Self-pay

## 2018-10-08 ENCOUNTER — Ambulatory Visit (INDEPENDENT_AMBULATORY_CARE_PROVIDER_SITE_OTHER): Payer: Medicare Other | Admitting: "Endocrinology

## 2018-10-08 ENCOUNTER — Encounter (INDEPENDENT_AMBULATORY_CARE_PROVIDER_SITE_OTHER): Payer: Self-pay | Admitting: "Endocrinology

## 2018-10-08 VITALS — BP 130/74 | HR 88 | Wt 159.8 lb

## 2018-10-08 DIAGNOSIS — J449 Chronic obstructive pulmonary disease, unspecified: Secondary | ICD-10-CM

## 2018-10-08 DIAGNOSIS — R5383 Other fatigue: Secondary | ICD-10-CM

## 2018-10-08 DIAGNOSIS — I10 Essential (primary) hypertension: Secondary | ICD-10-CM

## 2018-10-08 DIAGNOSIS — E042 Nontoxic multinodular goiter: Secondary | ICD-10-CM

## 2018-10-08 DIAGNOSIS — R7303 Prediabetes: Secondary | ICD-10-CM | POA: Diagnosis not present

## 2018-10-08 DIAGNOSIS — E663 Overweight: Secondary | ICD-10-CM

## 2018-10-08 DIAGNOSIS — E559 Vitamin D deficiency, unspecified: Secondary | ICD-10-CM

## 2018-10-08 DIAGNOSIS — E063 Autoimmune thyroiditis: Secondary | ICD-10-CM

## 2018-10-08 LAB — POCT GLYCOSYLATED HEMOGLOBIN (HGB A1C): Hemoglobin A1C: 6.3 % — AB (ref 4.0–5.6)

## 2018-10-08 LAB — POCT GLUCOSE (DEVICE FOR HOME USE): POC Glucose: 127 mg/dl — AB (ref 70–99)

## 2018-10-08 NOTE — Patient Instructions (Signed)
Follow up in 6 months. Please change your Synthroid as follows: Take one 88 mcg pill per day for 6 days each week. On the 7th days, however, take only 1/2 pill. Please repeat the lab tests in 2 months and again 1-2 weeks prior to your next visit.

## 2018-10-08 NOTE — Progress Notes (Signed)
Subjective:  Patient Name: Shelby Lin Date of Birth: Dec 03, 1940  MRN: [id number]  Penda Venturi  presents to the office today for follow-up of her hypothyroidism secondary to Hashimoto's disease, questionable nodular goiter, tachycardia, tremor, dyspepsia, fatigue, fibromyalgia, hyperlipidemia, night sweats, tremor, and pre-diabetes.  HISTORY OF PRESENT ILLNESS:   Machelle is a 78 y.o. Caucasian woman.  Abbegail was unaccompanied.  1. Ms. Narayanan was referred to me on 03/25/2005 by her primary care provider, Dr. Carol Ada, for evaluation of her thyroid problems. Ms. Basurto was 66 at that time.   A. At about age 26 the patient developed thyroid problems and was put on a thyroid medicine. Later when she became pregnant her obstetrician took her off that medicine. At about age 63, she developed severe fatigue and weight gain. She was put back on Synthroid. Her dose of Synthroid was 88 mcg per day for many years. In January 2006, she had fluctuations in her thyroid function tests. Synthroid was increased to 100 mcg and later to 125 mcg per day. The Synthroid dose was then reduced to 75 mcg. After blood tests about 9 months prior to her first appointment with me, the patient was put back on a dose of 88 mcg per day. When the patient was having fluctuations in her thyroid tests, her thyroid gland became visibly enlarged and felt full and tender to palpation. She was also more hoarse during those periods. Hoarseness came and went in parallel with the thyroid gland swelling. Ultrasound studies of the thyroid in January 2006 and January 2007 showed a multinodular goiter.   B. Thyroid function tests performed at that first visit showed a TSH of 4.139, free T4 1.22, and free T3 of 2.7. Because any TSH at that time greater than 3.0 was considered to be elevated physiologically, I increased her Synthroid from 88 to 100 mcg per day. TPO antibody was markedly positive at 954.0, c/w the diagnosis of Hashimoto's  thyroiditis. Her TSI level was quite normal at 0.9.   2. During the last 12 years the patient has had several medical issues that we have followed:  A. She has had several flare-ups of Hashimoto's disease, resulting in several changes in her doses of Synthroid. Her Synthroid dose was subsequently decreased to 88 mcg/day in January 2018.   B. Although she was initially thought to have a multinodular goiter, over time it became clear that all but one of the nodules were actually areas of echotexture heterogeneity. She has had one "nodularish" area of her left inferior pole that has remained unchanged since 2011. This area is ill-defined and may also just represent several overlapping areas of heterogeneity.  C. She has also had problems with fibromyalgia, arthritis, hypertension, depression, fatigue, tremor, hyperlipidemia, reflux, dyspepsia, and vitamin D deficiency.   D. In August 2016 her HbA1c was 6.2%, c/w prediabetes. Since hen her HbA1c values have varied from 5.8% to 6.4%. The patient refused to take metformin because three of her relatives lost their scalp hair when they took metformin.    3. The patient's last [facility] visit was on 2/14/120. At that visit I continued her Synthroid, MVI, and Biotech. In the interim she has been healthy. Her COPD is about the same. When she wears a mask it feels to her as if she is having more difficulty breathing, especially if she is more physically active. She is not having any allergy problems so far this Autumn.      A. In early January 2020 she developed  a severe exacerbation of her COPD was worse. She had to be treated with erythromycin and several rounds of steroids. Her COPD is somewhat better now  She was noted to have a pulmonary nodule. She also had decreased oxygen saturations during the night, so she now uses supplemental oxygen during the night.   B. Her tension headaches are about the same. Cervical stretching helps when she does her cervical stretching  exercises regularly.   C. She still has a lot of fatigue. Her energy has been low. She says that she sleeps well. Her husband has not been complaining about her snoring and she has not awakened gasping for breath.     D. She still has hot spells or sweats occasionally, but not very often.   E. Her depression is "no worse that it is for anybody else at this time in life".  Her family problems are unchanged.      F. She continues to have problems with fibromyalgia and arthritis in her wrists, hands, knees, and feet, worse when it rains.   G. Her feet still bother her a lot. Her feet get red and feel hot in the forefoot area a lot, especially when the weather is warm. .    H. Her Meniere's disease has not been active. She does not have ringing and roaring in her ears very often. .   I. She remains on her Protonix daily. She still takes Synthroid, 88 mcg/day. She also takes an MVI and Biotech vitamin D, 50,000 IU/weekly. Her cardiologist increased her rosuvastatin to 20 mg/day. When she had lab work recently she was told that her cholesterol is "great".   J. She is intermittently hoarse. The hoarseness still comes and goes in association with her allergies.   K. She rarely has sweet tea anymore.    4. Pertinent Review of Systems:  Constitutional: "I feel tired, just tired."  Her stamina and energy are worse. The SOB is worse if she is outside in the heat or if she is in a hurry and gets anxious.  Eyes: Vision is good as long as she wears her glasses. She had an eye exam in August 2019. There were no significant eye problems noted. Her follow up exam is scheduled for 10/14/18. Neck: The thyroid gland has not seemed to swell lately. The patient has no complaints of anterior neck soreness, tenderness,  pressure, discomfort, or difficulty swallowing.  Heart: She has not had any other chest symptoms. Heart rate increases with exercise or other physical activity. The patient has no other complaints of  palpitations, irregular heat beats, chest pain, or chest pressure. Gastrointestinal: She has been having lots of problems with constipation, so is now taking Miralax. Protonix is usually successful in controlling her acid reflux, indigestion, and dyspepsia.  Legs: Muscle mass and strength seem normal. Legs are stronger when she walks more. She no longer has episodic leg numbness and burning. She has had some edema occasionally, more in the left leg. She has been using more salt this Summer. Feet: Her feet still burn in the forefeet at times.    GYN: She still gets hot flashes, but much less frequently and much less intensely.    Emotional/psychiatric: She feels "okay".    Mental: She does "OK" at thinking, paying attention, remembering, and making decisions. "I still have my marbles."   PAST MEDICAL, FAMILY, AND SOCIAL HISTORY:  Past Medical History:  Diagnosis Date  . Anxiety   . Arthritis   . Asthma  in child   . Atherosclerotic heart disease of native coronary artery without angina pectoris   . Cataracts, bilateral   . Combined hyperlipidemia   . Constipation, unspecified   . COPD (chronic obstructive pulmonary disease) ([place])   . Coronary artery spasm ([place])   . Diabetic neuropathy ([place])   . DM (diabetes mellitus) with complications ([place])   . Dupuytren's contracture   . Dyspepsia   . Familial tremor   . Fatigue   . Fibromyalgia syndrome   . GERD (gastroesophageal reflux disease)   . Hashimoto's thyroiditis   . Hiatal hernia   . History of colon polyps   . Hx of Hashimoto thyroiditis   . Hypercholesteremia   . Hypertension   . Hypothyroidism, acquired, autoimmune   . IBS (irritable bowel syndrome)   . Meniere's disease of left ear   . Migraine without aura and without status migrainosus, not intractable   . Multinodular goiter (nontoxic)   . Nonexudative macular degeneration   . Raynaud's syndrome without gangrene   . Situational stress   . Tachycardia   . Thyroiditis,  autoimmune   . Ulcerative colitis, unspecified, without complications ([place])   . Vitamin B 12 deficiency   . Vitamin D deficiency   . Wears glasses     Family History  Problem Relation Age of Onset  . Cancer Mother   . Hypertension Mother   . Stroke Mother   . Heart attack Father   . Sudden death Father   . Hypertension Sister   . Diabetes Sister   . Stroke Sister   . Cancer Maternal Uncle        breast,   . Cancer Paternal Aunt        breast, ovarian  . Thyroid disease Neg Hx      Current Outpatient Medications:  .  albuterol (PROVENTIL HFA;VENTOLIN HFA) 108 (90 Base) MCG/ACT inhaler, Inhale 1-2 puffs into the lungs every 6 (six) hours as needed for wheezing or shortness of breath., Disp: 1 Inhaler, Rfl: 0 .  aspirin EC 81 MG tablet, Take 1 tablet (81 mg total) by mouth daily., Disp: , Rfl:  .  Cyanocobalamin (VITAMIN B 12 PO), Take by mouth daily., Disp: , Rfl:  .  DULoxetine (CYMBALTA) 30 MG capsule, TAKE 3 CAPSULES BY MOUTH ONCE DAILY ONCE A DAY ORALLY 30, Disp: , Rfl:  .  Fluticasone-Umeclidin-Vilant (TRELEGY ELLIPTA) 100-62.5-25 MCG/INH AEPB, Inhale 1 puff into the lungs daily., Disp: 60 each, Rfl: 3 .  furosemide (LASIX) 20 MG tablet, TAKE 1 TABLET BY MOUTH EVERY DAY AS NEEDED FOR SWELLING, Disp: , Rfl: 4 .  losartan (COZAAR) 100 MG tablet, Take 100 mg by mouth daily., Disp: , Rfl: 1 .  meclizine (ANTIVERT) 25 MG tablet, 1/2 to 1 tablet by mouth every 8 hrs as needed for diziness, Disp: , Rfl:  .  Multiple Vitamin (MULTIVITAMIN) tablet, Take 1 tablet by mouth daily.  , Disp: , Rfl:  .  omeprazole (PRILOSEC) 20 MG capsule, Take 20 mg by mouth daily., Disp: , Rfl:  .  Respiratory Therapy Supplies (FLUTTER) DEVI, Use device 2-3 times a day to break up congestion, Disp: 1 each, Rfl: 0 .  rosuvastatin (CRESTOR) 20 MG tablet, Take 1 tablet (20 mg total) by mouth daily., Disp: 90 tablet, Rfl: 3 .  SYNTHROID 88 MCG tablet, TAKE 1 TABLET (88 MCG TOTAL) BY MOUTH DAILY BEFORE  BREAKFAST., Disp: 90 tablet, Rfl: 1 .  VITAMIN D, CHOLECALCIFEROL, PO, Take 50,000 Units by  mouth once a week. , Disp: , Rfl:  .  hydrochlorothiazide (HYDRODIURIL) 25 MG tablet, TAKE 1 TABLET BY MOUTH EVERY DAY IN THE MORNING, Disp: , Rfl: 1 .  predniSONE (DELTASONE) 10 MG tablet, 4 tabs for 2 days, then 3 tabs for 2 days, 2 tabs for 2 days, then 1 tab for 2 days, then stop (Patient not taking: Reported on 10/08/2018), Disp: 20 tablet, Rfl: 0  Allergies as of 10/08/2018 - Review Complete 10/08/2018  Allergen Reaction Noted  . Acyclovir and related  04/01/2017  . Allegra [fexofenadine] Hives 04/28/2018  . Amoxicillin Hives 06/18/2010  . Codeine Nausea And Vomiting 02/21/2013  . Enalapril maleate Cough 04/28/2018  . Keflex [cephalexin] Hives 04/28/2018  . Levofloxacin Other (See Comments) 04/28/2018  . Lorabid [loracarbef] Hives 06/18/2010  . Sulfa antibiotics Hives 06/18/2010  . Sulfur Hives 09/08/2016    1. Work and Family: She retired in June 2013, but later went back to work part-time. She stopped part-time work in May 2020. She still stays fairly busy. Her husband retired at the same time.    2. Activities: She has not been walking much in the cold weather.   3. Smoking, alcohol, or drugs: None 4. Primary Care Provider: Dr. Carol Ada, [facility] [place]: There are no other significant problems involving Harriett's other body systems.   Objective:  Vital Signs:  BP 130/74   Pulse 88   Wt 159 lb 12.8 oz (72.5 kg)   BMI 27.86 kg/m   Wt Readings from Last 3 Encounters:  10/08/18 159 lb 12.8 oz (72.5 kg)  06/28/18 152 lb (68.9 kg)  06/23/18 151 lb (68.5 kg)    Ht Readings from Last 3 Encounters:  06/28/18 5' 3.5" (1.613 m)  04/26/18 5' 3.5" (1.613 m)  04/22/18 5' 3.5" (1.613 m)    HC Readings from Last 3 Encounters:  No data found for [facility]   Facility age limit for growth percentiles is 20 years.  Body mass index is 27.86 kg/m. Facility  age limit for growth percentiles is 20 years.  Body surface area is 1.8 meters squared.  PHYSICAL EXAMINATION  Constitutional: Anistyn looks tired today. She is alert and bright. Her affect and insight are normal, but she is not as upbeat and perky today as she has usually been. Her weight has increased 5 pounds.  Eyes: There is no obvious arcus or proptosis. Moisture appears normal. Mouth: The oropharynx and tongue appear normal. Oral moisture is normal. There is no oral hyperpigmentation. Neck: The neck appears to be visibly normal. No carotid bruits are noted. The thyroid gland is again within normal limits or just slightly enlarged at about 20-20+ grams in size. Today the left lobe is larger than the right. The consistency of the thyroid gland is normal. The thyroid gland is not tender to palpation. Lungs: The lungs are clear to auscultation. Air movement is fair.  Her expiratory rate is slow.  Heart: Heart rate and rhythm are regular. Heart sounds S1 and S2 are normal. I did not appreciate any pathologic cardiac murmurs. Abdomen: The abdomen is somewhat enlarged. Bowel sounds are normal. There is no obvious hepatomegaly, splenomegaly, or other mass effect. The abdomen is not tender to palpation. Arms: Muscle size and bulk are normal for age.  Hands: There is a 1+ tremor. Phalangeal and metacarpophalangeal joints are normal. Palmar muscles are low-normal. Palmar moisture is also normal. There is no palmar erythema. Palms are warm. Legs: Muscles appear  normal for age. No edema is present. Shins are sore to the touch.  Neurologic: Strength is normal for age in both the upper and lower extremities. Muscle tone is normal. Sensation to touch is normal in the legs.   LAB DATA:   Labs 10/05/18: HbA1c 6.3%; TSH 0.34, free T4 1.2, free T3 3.0; PTH 18, calcium 9.9, 25-OH vitamin D 43  Labs 04/09/18: HbA1c 6.4%, CBG 115  Labs 04/08/18: TSH 1.02, free T4 1.4, free T3 2.4; PTH 16, calcium 9.5, 25-OH  vitamin D 45  Labs 10/07/17: HbA1c 6.2%, CBG 135  Labs 10/02/17: TSH 0.66, free T4 1.3, free T3.9; PTH 36, calcium 9.6, 25-OH vitamin D 53 2  Labs 04/20/17: HbA1c 6.0%, CBG 112  Labs 04/16/17: TSH 1.12, free 4 1.5, free T3 3.2; 25-OH vitamin D 55  Labs 10/13/16: HbA1c 6.1%, CBG 122  Labs 10/07/16: TSH 2.63, free T4 1.3, free T3 2.9; 25-OH vitamin D 47  Labs 06/05/16: HbA1c 5.8%, fasting glucose 120; TSH  1.40, free T4 1.1, free T3 2.7  Labs 02/22/16: HbA1c 6.4%, CBG 104, C-peptide 1.57 (ref 080-3.85); PTH 28, calcium 10, 25-OH vitamin D 45; TSH 0.16, free T4 1.3, free T3 2.8; CMP normal  Labs 11/11/15: TSH 0.05, free T4 1.5, free T3 3.6  Labs 08/14/15: HbA1c 5.8%  Labs 04/27/15: HbA1c 6.3%; TSH 1.04, free T4 1.3, free T3 2.8; PTH 45, calcium 9.4, 25-OH vitamin D 19  Labs 04/24/15: C-peptide 1.43 (normal 0.80-3.90)  Labs 12/04/14: HbA1c 6.1%  Labs 09/26/14 at 12:01 PM: CMP normal; HbA1c 6.2%;CBC normal, iron 81; ACTH 21, cortisol 12.9; vitamin B12 705 (normal 211-911)  09/19/14: TSH 1.640, free T4 1.03, free T3 2.6; Calcium 9.3, PTH 33 (increased from 26 in March), 25-OH vitamin D 21 (decreased from 23 in march)  04/28/14: TSH 0.081, free T4 1.47, free T3 3.3; calcium 9.4, PTH 26, 25-OH vitamin D 23  10/27/13: TSH 1.087, free T4 1.04, free T3 2.7; Calcium 9.6, PTH 32, 25-hydroxyvitamin D 34  05/18/13: TSH 0.052, free T4 1.24, free T3 3.2  04/28/13: TSH 0.426, free T4 1.24, free T3 3.2; calcium 9.7, 25-hydroxy vitamin D 36  10/08/12: TSH 2.106, free T4 1.05, free T3 2.6; 25-OH vitamin D 37, PTH 39.4, calcium 9.1  04/02/12: TSH 1.073, free T4 1.31, free T3 2.9, calcium 9.5, 25-hydroxy vitamin D 31 - PTH was ordered but not done. I have re-ordered it this morning.  09/30/11: TSH 8.095, free T4 1.16, free T3 2.4. PTH 24, calcium 9.3, 25-hydroxy vitamin D 33, 1,25-dihydroxy vitamin D 95 11/21/10: Calcium was 9.5. Her PTH was 37.6. 25-hydroxy vitamin D was 41. 1, 25-dihydroxy vitamin D was 85, which  was slightly elevated. B12 was 352 (211-911)    IMAGING:   12/07/14 US thyroid: Both lobes are smaller, with maximum dimensions <2.5 cm. Both lobes are heterogenous in echotexture. The previously seen exophytic, solid, hypoechoic, ill-defined nodule in the left inferior pole remains at 0.9 x 0.4 x 0.5 cm and is unchanged from 08/23/2009.   10/12/12: US thyroid gland: Small 10 x 4 x 5 mm nodule or adjacent lymph node or parathyroid adenoma. Diffuse echogenicity. As I read the [place], there is definitely a lot of echogenicity c/w Hashimoto's thyroiditis. There may be a small nodule that is actually a bit less than one cm in longest dimension. This is unchanged since June 2011.   Assessment and Plan:   ASSESSMENT:  1. Hypothyroid:   A. She has acquired hypothyroidism due to Hashimoto's thyroiditis. During  the past 13 years her TFTs have been variable, requiring changes in her Synthroid dosage. Part of the variability was due to flare ups of Hashimoto's disease causing Hashitoxicosis.   B. Her TFTs in February 2019 were in the middle of the real physiologic normal range on her current Synthroid dose. Her TFTs in August 2019 were more variable. Her TSH was lower, her free T4 was lower, and her free T3 was higher. This pattern is most c/w having had a recent  flare up of thyroiditis. In contrast, her TFTS in February 2020 were mid-normal.   C. At this visit her TSH is low, her free T4 is lower, and her free T3 is higher. It is likely that she has been having another flare up of thyroiditis. It is possible, however, that her tremor is due to relatively high thyroid hormone levels. It is prudent to reduce her Synthroid dose a bit and repeat her lab tests in two months.  2. Thyroiditis: The patient's Hashimoto's disease has usually been clinically quiescent, but was mildly Hashitoxic in the January-March 2016 time period. Her thyroid US in October 2016 showed the heterogeneity c/w Hashimoto's thyroiditis. As noted  above, she appeared to have had a recent flare up of thyroiditis prior to her August 2019 visit and again prior to this visit. Her thyroiditis is clinically quiescent today.  3. Multinodular goiter:   A. The thyroid gland was again within normal limits for size at her last visit.   B. Her thyroid gland is borderline enlarged today, with the left lobe being a bit larger than the right. I do not palpate any nodular area.   C. The waxing and waning of thyroid gland size is c/w evolving Hashimoto's disease.   D. Although some [place] studies have shown a nodule, her [place] study in June 2011 showed either a questionable nodule or demonstrated the echotexture heterogeneity that is commonly seen with Hashimoto's disease. Her [place] studies in August 2014 and October 2016 were essentially unchanged. She did not need a FNA at those times. 4. Headaches: Headaches appear to be "tension" headaches. They have significantly decreased in both frequency and severity. She does better when she performs her cervical stretching exercises daily.   5. Hot flashes and sweats: These problems recur intermittently, but much less often..   6-7. Leg pains/vitamin D deficiency disease:   A. Her labs in July 2016 showed that her calcium was normal at 9.3, just below the 50%. Her PTH value was mid-range normal. Her vitamin D was lower. She needed more calcium and vitamin D. I had previously asked her to take Citracal-D at doses of 1200-1500 mg of calcium per day and [phone] IU of vitamin D per day.   B. Her labs in March 2017 showed that her PTH value of 45 was higher, but still mid-normal. Calcium was a bit higher at 9.4. Vitamin D was lower at 19. At that point she agreed to purchase and take the Biotech form of vitamin D, one 50,000 IU capsule each week.   C. Her PTH, calcium and vitamin D were normal in December 2017, although the vitamin D had decreased.   D. She is now taking Biotech vitamin D, 50,000 units each week.  Her vitamin D levels  were normal again in August 2018, in August 2019, in February 2020, and in August 2020.  Her PTH was lower in February 2020 and August 2020, but appropriate to her calcium level.  8. Meniere's disease: Her vertigo has  essentially resolved. Her Meniere's disease has bothered her intermittently in the past few months. Her hearing in the left ear varies.  9. Hoarseness: The fact that her hoarseness and post nasal drip come and go essentially in parallel indicate that the hoarseness is likely due to flare ups of her allergies. However, several flare ups of Hashimoto's disease have also caused hoarseness in the past. 10. Dyspepsia: This problem is reasonably well controlled with Protonix. 11. Hypertension: Her BP is normal today on her losartan.     13. Depression: Her affect and insight are very good today.   14. Fibromyalgia syndrome: This condition remains a problem for her, but seems to have worsened.  Exercise will help her "re-charge her mitochondrial batteries".  15. Fatigue:  This problem has also worsened, generally paralleling her COPD. 16. Prediabetes: Her HbA1c values in July and in October 2016 were elevated into the "pre-diabetes" range. Her HbA1c values have fluctuated over time depending upon her diet and her activity levels.  Her HbA1c today was lower than at her last visit. She needs to be careful with her carb intake.   17. Arthritis: Her arthritis seems to be doing fairly well.   44. Overweight: Ms. Mcintire is more overweight, most likely due to the extra carbs she has been consuming while stuck at home during the covid-19 pandemic.   PLAN:  1. Diagnostic: Reviewed HbA1c, CBG, TFTs, calcium, PTH, and vitamin D results. I ordered TFTs to be done in two months. I also ordered TFTs, HbA1c, PTH, calcium, and vitamin D prior to next visit.  2. Therapeutic: Try to do daily physical activity. Watch what she eats and drinks. Reduce Synthroid to 88 mcg/day for 6 days each week, but take only 1/2  pill on the 7th days. Take MVI daily and Biotech, 50,000 IU per week. Take one Tums at bedtime.  3. Patient education: We again discussed the issues of prediabetes, headaches, Hashimoto's disease, hypothyroidism, fibromyalgia, fatigue, and COPD. We discussed what she needs to do to control her BGs, to include getting more exercise and following our Eat Right Diet. She knows what she needs to do to get her weight back under control. Unfortunately, her COPD makes that increasingly more difficult.    4. Follow-up: 6 months  Level of Service: This visit lasted in excess of 50 minutes. More than 50% of the visit was devoted to counseling.   Tillman Sers, MD, CDE Adult and Pediatric Endocrinology

## 2018-10-19 NOTE — Progress Notes (Signed)
@Patient  ID: Shelby Lin, female    DOB: August 06, 1940, 78 y.o.   MRN: [id number]  Chief Complaint  Patient presents with   Follow-up    4 month follow up for COPD.     Referring provider: Carol Ada, MD  HPI:  78 year old female former smoker followed in our office for gold 3 COPD  PMH: Hypothyroidism due to Hashimoto's thyroiditis (managed by endocrinology [name]) Smoker/ Smoking History: Former smoker.  Quit 2002.  21-pack-year smoking history. Maintenance: Trelegy Ellipta Pt of: [name]  10/20/2018  - Visit   78 year old female former smoker followed in our office for COPD.  Patient is presenting today as a four-month follow-up.  Patient reports that she has been doing well.  She still has aspects of dyspnea especially when outside in [place] heat and humidity as well as when wearing a mask.  Patient also reports that sometimes she gets short of breath while she is showering.  Patient admits she has not been very active especially since SARS-CoV-2 global pandemic has limited patient's ability to go outside or go walking at the mall or stores.  Patient has not had to use her rescue inhaler lately.  She is wondering if her spacer works correctly for rescue inhaler.  Patient is also wondering if we can look at her flutter valve today.  Patient would like to review how to use it.  Patient is due for a pneumonia vaccine today.  Patient is also due for flu vaccine, patient reports that she will declined the flu vaccine today she will get it from her primary care provider or pharmacy later on in the fall/2020.  Patient does admit that she has been having some worsening breakthrough reflux lately.  Despite omeprazole use.  She is followed by Dr. Oletta Lamas at [facility].  She also admits to some aspects of dysphasia and having trouble swallowing.    mMRC Dyspnea Scale mMRC Score  10/20/2018 3    Tests:   02/28/2018-chest x-ray- COPD changes without acute infiltrate,  emphysematous and central bronchitic changes consistent with COPD  01/28/2018-CT chest without contrast-right middle lobe small solid pulmonary nodules are stable decreasing comparison at 02/11/2017 chest CT, considered benign, stable minimal scattered cylindrical bronchiectasis medial upper lobes and medial right middle lobe  02/11/2017-CT chest high-res- minimal scattered cylindrical bronchiectasis with associated minimal tree-in-bud opacity in the medial upper lobes and medial right middle lobes, findings could be consistent of MAI >>>Mild to moderate centrilobular emphysema with mild diffuse bronchial wall thickening, 2 small solid pulmonary nodules in right lung largest with average diameter 5 mm in the right middle lobe  04/01/2017-pulmonary function test- FVC 1.69 (61% predicted), postbronchodilator ratio 48, postbronchodilator FEV1 46, positive bronchodilator response, mid flow reversibility after bronchodilator, DLCO 52 >>>Severe obstructive airways disease >>>Patient did take Incruse 1 puff at 8 AM prior to testing  03/30/2018-overnight oximetry- less than 88% for 43 minutes and 43 seconds, less than 89% for an hour and 36 minutes of 51 seconds, baseline SPO2 of 91%  FENO:  No results found for: NITRICOXIDE  PFT: PFT Results Latest Ref Rng & Units 04/01/2017  FVC-Pre L 1.69  FVC-Predicted Pre % 61  FVC-Post L 2.02  FVC-Predicted Post % 73  Pre FEV1/FVC % % 45  Post FEV1/FCV % % 48  FEV1-Pre L 0.76  FEV1-Predicted Pre % 36  FEV1-Post L 0.96  DLCO UNC% % 52  DLCO COR %Predicted % 84    Imaging: Mm 3d Screen Breast Bilateral  Result Date: 10/06/2018 CLINICAL DATA:  Screening. EXAM: DIGITAL SCREENING BILATERAL MAMMOGRAM WITH TOMO AND CAD COMPARISON:  Previous exam(s). ACR Breast Density Category b: There are scattered areas of fibroglandular density. FINDINGS: There are no findings suspicious for malignancy. Images were processed with CAD. IMPRESSION: No mammographic evidence of  malignancy. A result letter of this screening mammogram will be mailed directly to the patient. RECOMMENDATION: Screening mammogram in one year. (Code:SM-B-01Y) BI-RADS CATEGORY  1: Negative. Electronically Signed   By: Lajean Manes M.D.   On: 10/06/2018 14:51      Specialty Problems      Pulmonary Problems   Stage 3 severe COPD by GOLD classification ([place])    02/11/2017-CT chest high-res- minimal scattered cylindrical bronchiectasis with associated minimal tree-in-bud opacity in the medial upper lobes and medial right middle lobes, findings could be consistent of MAI >>>Mild to moderate centrilobular emphysema with mild diffuse bronchial wall thickening, 2 small solid pulmonary nodules in right lung largest with average diameter 5 mm in the right middle lobe  04/01/2017-pulmonary function test- FVC 1.69 (61% predicted), postbronchodilator ratio 48, postbronchodilator FEV1 46, positive bronchodilator response, mid flow reversibility after bronchodilator, DLCO 52 >>>Severe obstructive airways disease >>>Patient did take Incruse 1 puff at 8 AM prior to testing  02/28/2018-chest x-ray- COPD changes without acute infiltrate, emphysematous and central bronchitic changes consistent with COPD  02/28/2018 - ED - COPD exacerbation       Bronchiectasis with acute exacerbation ([place])    02/11/2017-CT chest high-res- minimal scattered cylindrical bronchiectasis with associated minimal tree-in-bud opacity in the medial upper lobes and medial right middle lobes, findings could be consistent of MAI >>>Mild to moderate centrilobular emphysema with mild diffuse bronchial wall thickening, 2 small solid pulmonary nodules in right lung largest with average diameter 5 mm in the right middle lobe  01/28/2018-CT chest without contrast-right middle lobe small solid pulmonary nodules are stable decreasing comparison at 02/11/2017 chest CT, considered benign, stable minimal scattered cylindrical bronchiectasis medial upper  lobes and medial right middle lobe      Bronchiectasis without complication ([place])    [id number] chest high-res- minimal scattered cylindrical bronchiectasis with associated minimal tree-in-bud opacity in the medial upper lobes and medial right middle lobes, findings could be consistent of MAI >>>Mild to moderate centrilobular emphysema with mild diffuse bronchial wall thickening, 2 small solid pulmonary nodules in right lung largest with average diameter 5 mm in the right middle lobe      Nocturnal hypoxemia due to emphysema (HCC)    03/30/2018-overnight oximetry- less than 88% for 43 minutes and 43 seconds, less than 89% for an hour and 36 minutes of 51 seconds, baseline SPO2 of 91%         Allergies  Allergen Reactions   Acyclovir And Related    Allegra [Fexofenadine] Hives   Amoxicillin Hives   Codeine Nausea And Vomiting   Enalapril Maleate Cough   Keflex [Cephalexin] Hives   Levofloxacin Other (See Comments)    insomnia   Lorabid [Loracarbef] Hives   Sulfa Antibiotics Hives   Sulfur Hives    Immunization History  Administered Date(s) Administered   Influenza, High Dose Seasonal PF 12/12/2016, 11/27/2017   Pneumococcal Conjugate-13 10/16/2017   Pneumococcal Polysaccharide-23 10/20/2018   Due for Pneumovax23, will receive today  Due for flu, will wait to get from pcp or pharm   Past Medical History:  Diagnosis Date   Anxiety    Arthritis    Asthma in child    Atherosclerotic heart disease  of native coronary artery without angina pectoris    Cataracts, bilateral    Combined hyperlipidemia    Constipation, unspecified    COPD (chronic obstructive pulmonary disease) (HCC)    Coronary artery spasm (HCC)    Diabetic neuropathy (HCC)    DM (diabetes mellitus) with complications (HCC)    Dupuytren's contracture    Dyspepsia    Familial tremor    Fatigue    Fibromyalgia syndrome    GERD (gastroesophageal reflux disease)     Hashimoto's thyroiditis    Hiatal hernia    History of colon polyps    Hx of Hashimoto thyroiditis    Hypercholesteremia    Hypertension    Hypothyroidism, acquired, autoimmune    IBS (irritable bowel syndrome)    Meniere's disease of left ear    Migraine without aura and without status migrainosus, not intractable    Multinodular goiter (nontoxic)    Nonexudative macular degeneration    Raynaud's syndrome without gangrene    Situational stress    Tachycardia    Thyroiditis, autoimmune    Ulcerative colitis, unspecified, without complications (HCC)    Vitamin B 12 deficiency    Vitamin D deficiency    Wears glasses     Tobacco History: Social History   Tobacco Use  Smoking Status Former Smoker   Packs/day: 0.50   Years: 42.00   Pack years: 21.00   Types: Cigarettes   Quit date: 02/21/2001   Years since quitting: 17.6  Smokeless Tobacco Never Used   Counseling given: Not Answered  Continue to not smoke  Outpatient Encounter Medications as of 10/20/2018  Medication Sig   albuterol (PROVENTIL HFA;VENTOLIN HFA) 108 (90 Base) MCG/ACT inhaler Inhale 1-2 puffs into the lungs every 6 (six) hours as needed for wheezing or shortness of breath.   aspirin EC 81 MG tablet Take 1 tablet (81 mg total) by mouth daily.   Cyanocobalamin (VITAMIN B 12 PO) Take by mouth daily.   DULoxetine (CYMBALTA) 30 MG capsule TAKE 3 CAPSULES BY MOUTH ONCE DAILY ONCE A DAY ORALLY 30   Fluticasone-Umeclidin-Vilant (TRELEGY ELLIPTA) 100-62.5-25 MCG/INH AEPB Inhale 1 puff into the lungs daily.   furosemide (LASIX) 20 MG tablet TAKE 1 TABLET BY MOUTH EVERY DAY AS NEEDED FOR SWELLING   hydrochlorothiazide (HYDRODIURIL) 25 MG tablet TAKE 1 TABLET BY MOUTH EVERY DAY IN THE MORNING   losartan (COZAAR) 100 MG tablet Take 100 mg by mouth daily.   meclizine (ANTIVERT) 25 MG tablet 1/2 to 1 tablet by mouth every 8 hrs as needed for diziness   Multiple Vitamin (MULTIVITAMIN)  tablet Take 1 tablet by mouth daily.     omeprazole (PRILOSEC) 20 MG capsule Take 20 mg by mouth daily.   predniSONE (DELTASONE) 10 MG tablet 4 tabs for 2 days, then 3 tabs for 2 days, 2 tabs for 2 days, then 1 tab for 2 days, then stop   Respiratory Therapy Supplies (FLUTTER) DEVI Use device 2-3 times a day to break up congestion   rosuvastatin (CRESTOR) 20 MG tablet Take 1 tablet (20 mg total) by mouth daily.   SYNTHROID 88 MCG tablet TAKE 1 TABLET (88 MCG TOTAL) BY MOUTH DAILY BEFORE BREAKFAST.   VITAMIN D, CHOLECALCIFEROL, PO Take 50,000 Units by mouth once a week.    Fluticasone-Umeclidin-Vilant (TRELEGY ELLIPTA) 100-62.5-25 MCG/INH AEPB Inhale 1 puff into the lungs daily.   No facility-administered encounter medications on file as of 10/20/2018.     Review of Systems  Review of Systems  Constitutional: Positive for fatigue. Negative for activity change and fever.  HENT: Positive for trouble swallowing. Negative for sinus pressure, sinus pain and sore throat.   Respiratory: Positive for shortness of breath (with exertion ). Negative for cough and wheezing.   Cardiovascular: Negative for chest pain and palpitations.  Gastrointestinal: Negative for diarrhea, nausea and vomiting.       Having breakthrough reflux occasionally  Musculoskeletal: Negative for arthralgias.  Neurological: Negative for dizziness.  Psychiatric/Behavioral: Negative for sleep disturbance. The patient is not nervous/anxious.      Physical Exam  BP 116/76    Pulse 81    Temp (!) 97.1 F (36.2 C) (Oral)    Ht 5' 3.5" (1.613 m)    Wt 158 lb 6.4 oz (71.8 kg)    SpO2 98%    BMI 27.62 kg/m   Wt Readings from Last 5 Encounters:  10/20/18 158 lb 6.4 oz (71.8 kg)  10/08/18 159 lb 12.8 oz (72.5 kg)  06/28/18 152 lb (68.9 kg)  06/23/18 151 lb (68.5 kg)  04/26/18 155 lb 6.4 oz (70.5 kg)     Physical Exam Vitals signs and nursing note reviewed.  Constitutional:      General: She is not in acute  distress.    Appearance: Normal appearance.  HENT:     Head: Normocephalic and atraumatic.     Right Ear: Tympanic membrane, ear canal and external ear normal. There is no impacted cerumen.     Left Ear: Tympanic membrane, ear canal and external ear normal. There is no impacted cerumen.     Nose: Nose normal. No congestion.     Mouth/Throat:     Mouth: Mucous membranes are moist.     Pharynx: Oropharynx is clear.  Eyes:     Pupils: Pupils are equal, round, and reactive to light.  Neck:     Musculoskeletal: Normal range of motion.  Cardiovascular:     Rate and Rhythm: Normal rate and regular rhythm.     Pulses: Normal pulses.     Heart sounds: Normal heart sounds. No murmur.  Pulmonary:     Effort: Pulmonary effort is normal. No respiratory distress.     Breath sounds: Normal breath sounds. No decreased air movement. No decreased breath sounds, wheezing or rales.  Abdominal:     General: Abdomen is flat. Bowel sounds are normal.     Palpations: Abdomen is soft.  Skin:    General: Skin is warm and dry.     Capillary Refill: Capillary refill takes less than 2 seconds.  Neurological:     General: No focal deficit present.     Mental Status: She is alert and oriented to person, place, and time. Mental status is at baseline.     Gait: Gait normal.  Psychiatric:        Mood and Affect: Mood normal.        Behavior: Behavior normal.        Thought Content: Thought content normal.        Judgment: Judgment normal.      Lab Results:  CBC    Component Value Date/Time   WBC 14.0 (H) 02/28/2018 1732   RBC 3.93 02/28/2018 1732   HGB 11.6 (L) 02/28/2018 1732   HCT 37.0 02/28/2018 1732   PLT 382 02/28/2018 1732   MCV 94.1 02/28/2018 1732   MCH 29.5 02/28/2018 1732   MCHC 31.4 02/28/2018 1732   RDW 12.3 02/28/2018 1732   LYMPHSABS 2.9 02/28/2018 1732  MONOABS 1.2 (H) 02/28/2018 1732   EOSABS 0.3 02/28/2018 1732   BASOSABS 0.1 02/28/2018 1732    BMET    Component Value  Date/Time   NA 136 02/28/2018 1732   K 3.5 02/28/2018 1732   CL 98 02/28/2018 1732   CO2 29 02/28/2018 1732   GLUCOSE 133 (H) 02/28/2018 1732   BUN 12 02/28/2018 1732   CREATININE 0.63 02/28/2018 1732   CREATININE 0.59 (L) 02/22/2016 0001   CALCIUM 9.9 10/05/2018 0857   CALCIUM 9.7 04/06/2012 1019   GFRNONAA >60 02/28/2018 1732   GFRAA >60 02/28/2018 1732    BNP    Component Value Date/Time   BNP 25.5 02/28/2018 1732    ProBNP No results found for: PROBNP    Assessment & Plan:   Stage 3 severe COPD by GOLD classification ([place]) Plan: Continue Trelegy Ellipta, samples provided today Continue rescue inhaler as needed for shortness of breath or wheezing every 6 hours, new spacer provided today Pneumovax 23 today Start increasing daily physical activity as able Flu vaccine in fall/2020 Follow-up with our office in 4 months  Bronchiectasis without complication ([place]) Plan: Resume flutter valve use, reviewed use with patient today Use 2 times daily, 10 breaths each time Pneumovax 23 today Flu vaccine in fall/2020   Nocturnal hypoxemia due to emphysema ([place]) Plan: Continue nighttime oxygen use  Gastroesophageal reflux disease Plan: Continue omeprazole Schedule follow-up with [facility] to further evaluate your dysphasia as well as management of GERD  Healthcare maintenance Plan: Pneumovax 23 today Flu vaccine in fall/2020    Return in about 4 months (around 02/19/2019), or if symptoms worsen or fail to improve, for Follow up with [name] FNP-C.   Lauraine Rinne, NP 10/20/2018   This appointment was 32 minutes long with over 50% of the time in direct face-to-face patient care, assessment, plan of care, and follow-up.

## 2018-10-20 ENCOUNTER — Ambulatory Visit (INDEPENDENT_AMBULATORY_CARE_PROVIDER_SITE_OTHER): Payer: Medicare Other | Admitting: Pulmonary Disease

## 2018-10-20 ENCOUNTER — Encounter: Payer: Self-pay | Admitting: Pulmonary Disease

## 2018-10-20 ENCOUNTER — Other Ambulatory Visit: Payer: Self-pay

## 2018-10-20 VITALS — BP 116/76 | HR 81 | Temp 97.1°F | Ht 63.5 in | Wt 158.4 lb

## 2018-10-20 DIAGNOSIS — J479 Bronchiectasis, uncomplicated: Secondary | ICD-10-CM

## 2018-10-20 DIAGNOSIS — Z23 Encounter for immunization: Secondary | ICD-10-CM

## 2018-10-20 DIAGNOSIS — J449 Chronic obstructive pulmonary disease, unspecified: Secondary | ICD-10-CM | POA: Diagnosis not present

## 2018-10-20 DIAGNOSIS — G4736 Sleep related hypoventilation in conditions classified elsewhere: Secondary | ICD-10-CM

## 2018-10-20 DIAGNOSIS — J439 Emphysema, unspecified: Secondary | ICD-10-CM

## 2018-10-20 DIAGNOSIS — Z0000 Encounter for general adult medical examination without abnormal findings: Secondary | ICD-10-CM

## 2018-10-20 DIAGNOSIS — K219 Gastro-esophageal reflux disease without esophagitis: Secondary | ICD-10-CM

## 2018-10-20 MED ORDER — TRELEGY ELLIPTA 100-62.5-25 MCG/INH IN AEPB: 1.0000 | INHALATION_SPRAY | Freq: Every day | RESPIRATORY_TRACT | 0 refills | Status: DC

## 2018-10-20 NOTE — Patient Instructions (Addendum)
You were seen today by Lauraine Rinne, NP  for:   1. Stage 3 severe COPD by GOLD classification ([place])  Trelegy Ellipta  >>> 1 puff daily in the morning >>>rinse mouth out after use  >>> This inhaler contains 3 medications that help manage her respiratory status, contact our office if you cannot afford this medication or unable to remain on this medication  Only use your albuterol as a rescue medication to be used if you can't catch your breath by resting or doing a relaxed purse lip breathing pattern.  - The less you use it, the better it will work when you need it. - Ok to use up to 2 puffs  every 4 hours if you must but call for immediate appointment if use goes up over your usual need - Don't leave home without it !!  (think of it like the spare tire for your car)  >>>new spacer provided today    Note your daily symptoms > remember "red flags" for COPD:   >>>Increase in cough >>>increase in sputum production >>>increase in shortness of breath or activity  intolerance.   If you notice these symptoms, please call the office to be seen.   Start increasing your daily physical activity  Pneumovax 23 today  2. Bronchiectasis without complication (HCC)  Bronchiectasis: This is the medical term which indicates that you have damage, dilated airways making you more susceptible to respiratory infection. Use a flutter valve 10 breaths twice a day or 4 to 5 breaths 4-5 times a day to help clear mucus out Let us know if you have cough with change in mucus color or fevers or chills.  At that point you would need an antibiotic. Maintain a healthy nutritious diet, eating whole foods Take your medications as prescribed  3. Nocturnal hypoxemia due to emphysema (HCC)  Continue oxygen therapy as prescribed - 2L at night >>>maintain oxygen saturations greater than 88 percent  >>>if unable to maintain oxygen saturations please contact the office  >>>do not smoke with oxygen  >>>can use nasal  saline gel or nasal saline rinses to moisturize nose if oxygen causes dryness   4. Gastroesophageal reflux disease, esophagitis presence not specified  Contact Dr. Oletta Lamas office and schedule an appointment as you are having trouble swallowing as well as worsened reflux  Omeprazole 40 mg tablet  >>>Please take 1 tablet daily 15 minutes to 30 minutes before your first and last meal of the day as well as before your other medications >>>Try to take at the same time each day >>>take this medication daily  GERD management: >>>Avoid laying flat until 2 hours after meals >>>Elevate head of the bed including entire chest >>>Reduce size of meals and amount of fat, acid, spices, caffeine and sweets >>>If you are smoking, Please stop! >>>Decrease alcohol consumption >>>Work on maintaining a healthy weight with normal BMI     5. Healthcare maintenance  Pneumovax 23 today  Receive your flu vaccine in the fall  Increase your daily physical activity   Follow Up:    Return in about 4 months (around 02/19/2019), or if symptoms worsen or fail to improve, for Follow up with [name] FNP-C.   Please do your part to reduce the spread of COVID-19:      Reduce your risk of any infection  and COVID19 by using the similar precautions used for avoiding the common cold or flu:   Wash your hands often with soap and warm water for at least  20 seconds.  If soap and water are not readily available, use an alcohol-based hand sanitizer with at least 60% alcohol.   If coughing or sneezing, cover your mouth and nose by coughing or sneezing into the elbow areas of your shirt or coat, into a tissue or into your sleeve (not your hands).  WEAR A MASK when in public   Avoid shaking hands with others and consider head nods or verbal greetings only.  Avoid touching your eyes, nose, or mouth with unwashed hands.   Avoid close contact with people who are sick.  Avoid places or events with large  numbers of people in one location, like concerts or sporting events.  If you have some symptoms but not all symptoms, continue to monitor at home and seek medical attention if your symptoms worsen.  If you are having a medical emergency, call 911.   [place] / e-Visit: [email or web address]         [facility]: [facility]: [phone]                   [facility]: [phone]     It is flu season:   >>> Best ways to protect herself from the flu: Receive the yearly flu vaccine, practice good hand hygiene washing with soap and also using hand sanitizer when available, eat a nutritious meals, get adequate rest, hydrate appropriately   Please contact the office if your symptoms worsen or you have concerns that you are not improving.   Thank you for choosing [facility] for your healthcare, and for allowing [place] to partner with you on your healthcare journey. I am thankful to be able to provide care to you today.   [name] FNP-C    COPD and Physical Activity Chronic obstructive pulmonary disease (COPD) is a long-term (chronic) condition that affects the lungs. COPD is a general term that can be used to describe many different lung problems that cause lung swelling (inflammation) and limit airflow, including chronic bronchitis and emphysema. The main symptom of COPD is shortness of breath, which makes it harder to do even simple tasks. This can also make it harder to exercise and be active. Talk with your health care provider about treatments to help you breathe better and actions you can take to prevent breathing problems during physical activity. What are the benefits of exercising with COPD? Exercising regularly is an important part of a healthy lifestyle. You can still exercise and do physical activities even though you  have COPD. Exercise and physical activity improve your shortness of breath by increasing blood flow (circulation). This causes your heart to pump more oxygen through your body. Moderate exercise can improve your:  Oxygen use.  Energy level.  Shortness of breath.  Strength in your breathing muscles.  Heart health.  Sleep.  Self-esteem and feelings of self-worth.  Depression, stress, and anxiety levels. Exercise can benefit everyone with COPD. The severity of your disease may affect how hard you can exercise, especially at first, but everyone can benefit. Talk with your health care provider about how much exercise is safe for you, and which activities and exercises are safe for you. What actions can I take to prevent breathing problems during physical activity?  Sign up for a pulmonary rehabilitation program. This type of program may include: ? Education about lung diseases. ? Exercise classes that teach you how to exercise and  be more active while improving your breathing. This usually involves:  Exercise using your lower extremities, such as a stationary bicycle.  About 30 minutes of exercise, 2 to 5 times per week, for 6 to 12 weeks  Strength training, such as push ups or leg lifts. ? Nutrition education. ? Group classes in which you can talk with others who also have COPD and learn ways to manage stress.  If you use an oxygen tank, you should use it while you exercise. Work with your health care provider to adjust your oxygen for your physical activity. Your resting flow rate is different from your flow rate during physical activity.  While you are exercising: ? Take slow breaths. ? Pace yourself and do not try to go too fast. ? Purse your lips while breathing out. Pursing your lips is similar to a kissing or whistling position. ? If doing exercise that uses a quick burst of effort, such as weight lifting:  Breathe in before starting the exercise.  Breathe out during the  hardest part of the exercise (such as raising the weights). Where to find support You can find support for exercising with COPD from:  Your health care provider.  A pulmonary rehabilitation program.  Your local health department or community health programs.  Support groups, online or in-person. Your health care provider may be able to recommend support groups. Where to find more information You can find more information about exercising with COPD from:  American Lung Association: ClassInsider.se.  COPD Foundation: https://www.rivera.net/. Contact a health care provider if:  Your symptoms get worse.  You have chest pain.  You have nausea.  You have a fever.  You have trouble talking or catching your breath.  You want to start a new exercise program or a new activity. Summary  COPD is a general term that can be used to describe many different lung problems that cause lung swelling (inflammation) and limit airflow. This includes chronic bronchitis and emphysema.  Exercise and physical activity improve your shortness of breath by increasing blood flow (circulation). This causes your heart to provide more oxygen to your body.  Contact your health care provider before starting any exercise program or new activity. Ask your health care provider what exercises and activities are safe for you. This information is not intended to replace advice given to you by your health care provider. Make sure you discuss any questions you have with your health care provider. Document Released: 03/05/2017 Document Revised: 06/02/2018 Document Reviewed: 03/05/2017 Elsevier Patient Education  [place].    Exercises To Do While Sitting  Exercises that you do while sitting (chair exercises) can give you many of the same benefits as full exercise. Benefits include strengthening your heart, burning calories, and keeping muscles and joints healthy. Exercise can also improve your mood and help with  depression and anxiety. You may benefit from chair exercises if you are unable to do standing exercises because of:  Diabetic foot pain.  Obesity.  Illness.  Arthritis.  Recovery from surgery or injury.  Breathing problems.  Balance problems.  Another type of disability. Before starting chair exercises, check with your health care provider or a physical therapist to find out how much exercise you can tolerate and which exercises are safe for you. If your health care provider approves:  Start out slowly and build up over time. Aim to work up to about 10-20 minutes for each exercise session.  Make exercise part of your daily routine.  Drink water when you exercise. Do not wait until you are thirsty. Drink every 10-15 minutes.  Stop exercising right away if you have pain, nausea, shortness of breath, or dizziness.  If you are exercising in a wheelchair, make sure to lock the wheels.  Ask your health care provider whether you can do tai chi or yoga. Many positions in these mind-body exercises can be modified to do while seated. Warm-up Before starting other exercises: 1. Sit up as straight as you can. Have your knees bent at 90 degrees, which is the shape of the capital letter "L." Keep your feet flat on the floor. 2. Sit at the front edge of your chair, if you can. 3. Pull in (tighten) the muscles in your abdomen and stretch your spine and neck as straight as you can. Hold this position for a few minutes. 4. Breathe in and out evenly. Try to concentrate on your breathing, and relax your mind. Stretching Exercise A: Arm stretch 1. Hold your arms out straight in front of your body. 2. Bend your hands at the wrist with your fingers pointing up, as if signaling someone to stop. Notice the slight tension in your forearms as you hold the position. 3. Keeping your arms out and your hands bent, rotate your hands outward as far as you can and hold this stretch. Aim to have your thumbs  pointing up and your pinkie fingers pointing down. Slowly repeat arm stretches for one minute as tolerated. Exercise B: Leg stretch 1. If you can move your legs, try to "draw" letters on the floor with the toes of your foot. Write your name with one foot. 2. Write your name with the toes of your other foot. Slowly repeat the movements for one minute as tolerated. Exercise C: Reach for the sky 1. Reach your hands as far over your head as you can to stretch your spine. 2. Move your hands and arms as if you are climbing a rope. Slowly repeat the movements for one minute as tolerated. Range of motion exercises Exercise A: Shoulder roll 1. Let your arms hang loosely at your sides. 2. Lift just your shoulders up toward your ears, then let them relax back down. 3. When your shoulders feel loose, rotate your shoulders in backward and forward circles. Do shoulder rolls slowly for one minute as tolerated. Exercise B: March in place 1. As if you are marching, pump your arms and lift your legs up and down. Lift your knees as high as you can. ? If you are unable to lift your knees, just pump your arms and move your ankles and feet up and down. March in place for one minute as tolerated. Exercise C: Seated jumping jacks 1. Let your arms hang down straight. 2. Keeping your arms straight, lift them up over your head. Aim to point your fingers to the ceiling. 3. While you lift your arms, straighten your legs and slide your heels along the floor to your sides, as wide as you can. 4. As you bring your arms back down to your sides, slide your legs back together. ? If you are unable to use your legs, just move your arms. Slowly repeat seated jumping jacks for one minute as tolerated. Strengthening exercises Exercise A: Shoulder squeeze 1. Hold your arms straight out from your body to your sides, with your elbows bent and your fists pointed at the ceiling. 2. Keeping your arms in the bent position, move them  forward so your elbows  and forearms meet in front of your face. 3. Open your arms back out as wide as you can with your elbows still bent, until you feel your shoulder blades squeezing together. Hold for 5 seconds. Slowly repeat the movements forward and backward for one minute as tolerated. Contact a health care provider if you:  Had to stop exercising due to any of the following: ? Pain. ? Nausea. ? Shortness of breath. ? Dizziness. ? Fatigue.  Have significant pain or soreness after exercising. Get help right away if you have:  Chest pain.  Difficulty breathing. These symptoms may represent a serious problem that is an emergency. Do not wait to see if the symptoms will go away. Get medical help right away. Call your local emergency services (911 in the [place] Do not drive yourself to the hospital. This information is not intended to replace advice given to you by your health care provider. Make sure you discuss any questions you have with your health care provider. Document Released: 12/24/2016 Document Revised: 06/03/2018 Document Reviewed: 12/24/2016 Elsevier Patient Education  [place] Exercise  The sit-to-stand exercise (also known as the chair stand or chair rise exercise) strengthens your lower body and helps you maintain or improve your mobility and independence. The goal is to do the sit-to-stand exercise without using your hands. This will be easier as you become stronger. You should always talk with your health care provider before starting any exercise program, especially if you have had recent surgery. Do the exercise exactly as told by your health care provider and adjust it as directed. It is normal to feel mild stretching, pulling, tightness, or discomfort as you do this exercise, but you should stop right away if you feel sudden pain or your pain gets worse. Do not begin doing this exercise until told by your health care provider. What the  sit-to-stand exercise does The sit-to-stand exercise helps to strengthen the muscles in your thighs and the muscles in the center of your body that give you stability (core muscles). This exercise is especially helpful if:  You have had knee or hip surgery.  You have trouble getting up from a chair, out of a car, or off the toilet. How to do the sit-to-stand exercise 1. Sit toward the front edge of a sturdy chair without armrests. Your knees should be bent and your feet should be flat on the floor and shoulder-width apart. 2. Place your hands lightly on each side of the seat. Keep your back and neck as straight as possible, with your chest slightly forward. 3. Breathe in slowly. Lean forward and slightly shift your weight to the front of your feet. 4. Breathe out as you slowly stand up. Use your hands as little as possible. 5. Stand and pause for a full breath in and out. 6. Breathe in as you sit down slowly. Tighten your core and abdominal muscles to control your lowering as much as possible. 7. Breathe out slowly. 8. Do this exercise 10-15 times. If needed, do it fewer times until you build up strength. 9. Rest for 1 minute, then do another set of 10-15 repetitions. To change the difficulty of the sit-to-stand exercise  If the exercise is too difficult, use a chair with sturdy armrests, and push off the armrests to help you come to the standing position. You can also use the armrests to help slowly lower yourself back to sitting. As this gets easier, try to use your arms  less. You can also place a firm cushion or pillow on the chair to make the surface higher.  If this exercise is too easy, do not use your arms to help raise or lower yourself. You can also wear a weighted vest, use hand weights, increase your repetitions, or try a lower chair. General tips  You may feel tired when starting an exercise routine. This is normal.  You may have muscle soreness that lasts a few days. This is  normal. As you get stronger, you may not feel muscle soreness.  Use smooth, steady movements.  Do not  hold your breath during strength exercises. This can cause unsafe changes in your blood pressure.  Breathe in slowly through your nose, and breathe out slowly through your mouth. Summary  Strengthening your lower body is an important step to help you move safely and independently.  The sit-to-stand exercise helps strengthen the muscles in your thighs and core.  You should always talk with your health care provider before starting any exercise program, especially if you have had recent surgery. This information is not intended to replace advice given to you by your health care provider. Make sure you discuss any questions you have with your health care provider. Document Released: 04/03/2016 Document Revised: 12/09/2017 Document Reviewed: 04/03/2016 Elsevier Patient Education  [place].   Pneumococcal Vaccine, Polyvalent solution for injection What is this medicine? PNEUMOCOCCAL VACCINE, POLYVALENT (NEU mo KOK al vak SEEN, pol ee VEY luhnt) is a vaccine to prevent pneumococcus bacteria infection. These bacteria are a major cause of ear infections, Strep throat infections, and serious pneumonia, meningitis, or blood infections worldwide. These vaccines help the body to produce antibodies (protective substances) that help your body defend against these bacteria. This vaccine is recommended for people 77 years of age and older with health problems. It is also recommended for all adults over 20 years old. This vaccine will not treat an infection. This medicine may be used for other purposes; ask your health care provider or pharmacist if you have questions. COMMON BRAND NAME(S): Pneumovax 23 What should I tell my health care provider before I take this medicine? They need to know if you have any of these conditions:  bleeding problems  bone marrow or organ transplant  cancer,  Hodgkin's disease  fever  infection  immune system problems  low platelet count in the blood  seizures  an unusual or allergic reaction to pneumococcal vaccine, diphtheria toxoid, other vaccines, latex, other medicines, foods, dyes, or preservatives  pregnant or trying to get pregnant  breast-feeding How should I use this medicine? This vaccine is for injection into a muscle or under the skin. It is given by a health care professional. A copy of Vaccine Information Statements will be given before each vaccination. Read this sheet carefully each time. The sheet may change frequently. Talk to your pediatrician regarding the use of this medicine in children. While this drug may be prescribed for children as young as 32 years of age for selected conditions, precautions do apply. Overdosage: If you think you have taken too much of this medicine contact a poison control center or emergency room at once. NOTE: This medicine is only for you. Do not share this medicine with others. What if I miss a dose? It is important not to miss your dose. Call your doctor or health care professional if you are unable to keep an appointment. What may interact with this medicine?  medicines for cancer chemotherapy  medicines that suppress your immune function  medicines that treat or prevent blood clots like warfarin, enoxaparin, and dalteparin  steroid medicines like prednisone or cortisone This list may not describe all possible interactions. Give your health care provider a list of all the medicines, herbs, non-prescription drugs, or dietary supplements you use. Also tell them if you smoke, drink alcohol, or use illegal drugs. Some items may interact with your medicine. What should I watch for while using this medicine? Mild fever and pain should go away in 3 days or less. Report any unusual symptoms to your doctor or health care professional. What side effects may I notice from receiving this  medicine? Side effects that you should report to your doctor or health care professional as soon as possible:  allergic reactions like skin rash, itching or hives, swelling of the face, lips, or tongue  breathing problems  confused  fever over 102 degrees F  pain, tingling, numbness in the hands or feet  seizures  unusual bleeding or bruising  unusual muscle weakness Side effects that usually do not require medical attention (report to your doctor or health care professional if they continue or are bothersome):  aches and pains  diarrhea  fever of 102 degrees F or less  headache  irritable  loss of appetite  pain, tender at site where injected  trouble sleeping This list may not describe all possible side effects. Call your doctor for medical advice about side effects. You may report side effects to FDA at 1-800-FDA-1088. Where should I keep my medicine? This does not apply. This vaccine is given in a clinic, pharmacy, doctor's office, or other health care setting and will not be stored at home. NOTE: This sheet is a summary. It may not cover all possible information. If you have questions about this medicine, talk to your doctor, pharmacist, or health care provider.  2020 Elsevier/Gold Standard (2007-09-17 14:32:37)

## 2018-10-20 NOTE — Assessment & Plan Note (Signed)
Plan: Continue nighttime oxygen use 

## 2018-10-20 NOTE — Assessment & Plan Note (Signed)
Plan: Continue omeprazole Schedule follow-up with [facility] to further evaluate your dysphasia as well as management of GERD

## 2018-10-20 NOTE — Assessment & Plan Note (Signed)
Plan: Continue Trelegy Ellipta, samples provided today Continue rescue inhaler as needed for shortness of breath or wheezing every 6 hours, new spacer provided today Pneumovax 23 today Start increasing daily physical activity as able Flu vaccine in fall/2020 Follow-up with our office in 4 months

## 2018-10-20 NOTE — Assessment & Plan Note (Signed)
Plan: Pneumovax 23 today Flu vaccine in fall/2020

## 2018-10-20 NOTE — Assessment & Plan Note (Addendum)
Plan: Resume flutter valve use, reviewed use with patient today Use 2 times daily, 10 breaths each time Pneumovax 23 today Flu vaccine in fall/2020

## 2018-12-23 NOTE — Progress Notes (Deleted)
CARDIOLOGY OFFICE NOTE  Date:  12/23/2018    Hansel Starling Date of Birth: 05-10-1940 Medical Record [id number]  PCP:  Carol Ada, MD  Cardiologist:  Servando Snare & ***    No chief complaint on file.   History of Present Illness: Shelby Lin is a 78 y.o. female who presents today for a *** with COPD followed by pulmonary.  A CT scan was performed and demonstrated coronary artery calcification.  Back in 2014 she underwent a left heart catheterization that showed no significant flow-limiting coronary artery disease.  She was having spells of jaw pain relieved with Tums.  Her father had GERD and ended up having coronary disease and dying suddenly of a heart attack.  She has a history of breast cancer.  Family history of coronary artery disease as below.  Has been taking low-dose rosuvastatin.  She denies any anginal symptoms such as chest pain or chest tightness.  She does feel shortness of breath with her COPD but this is not unusual.  The patient {does/does not:200015} have symptoms concerning for COVID-19 infection (fever, chills, cough, or new shortness of breath).   Comes in today. Here with   Past Medical History:  Diagnosis Date  . Anxiety   . Arthritis   . Asthma in child   . Atherosclerotic heart disease of native coronary artery without angina pectoris   . Cataracts, bilateral   . Combined hyperlipidemia   . Constipation, unspecified   . COPD (chronic obstructive pulmonary disease) ([place])   . Coronary artery spasm ([place])   . Diabetic neuropathy ([place])   . DM (diabetes mellitus) with complications ([place])   . Dupuytren's contracture   . Dyspepsia   . Familial tremor   . Fatigue   . Fibromyalgia syndrome   . GERD (gastroesophageal reflux disease)   . Hashimoto's thyroiditis   . Hiatal hernia   . History of colon polyps   . Hx of Hashimoto thyroiditis   . Hypercholesteremia   . Hypertension   . Hypothyroidism, acquired, autoimmune   . IBS (irritable  bowel syndrome)   . Meniere's disease of left ear   . Migraine without aura and without status migrainosus, not intractable   . Multinodular goiter (nontoxic)   . Nonexudative macular degeneration   . Raynaud's syndrome without gangrene   . Situational stress   . Tachycardia   . Thyroiditis, autoimmune   . Ulcerative colitis, unspecified, without complications ([place])   . Vitamin B 12 deficiency   . Vitamin D deficiency   . Wears glasses     Past Surgical History:  Procedure Laterality Date  . BREAST EXCISIONAL BIOPSY Left    benign  . BREAST EXCISIONAL BIOPSY Right    milk gland removed  . BREAST LUMPECTOMY  1990   left  . BREAST LUMPECTOMY WITH NEEDLE LOCALIZATION Right 02/28/2013   Procedure: RIGHT BREAST NEEDLE LOCALIZATION LUMPECTOMY ;  Surgeon: Edward Jolly, MD;  Location: [facility];  Service: General;  Laterality: Right;  . CHOLECYSTECTOMY  2008   lapcholi  . DILATION AND CURETTAGE OF UTERUS    . LEFT HEART CATHETERIZATION WITH CORONARY ANGIOGRAM N/A 01/26/2013   Procedure: LEFT HEART CATHETERIZATION WITH CORONARY ANGIOGRAM;  Surgeon: Candee Furbish, MD;  [facility] C[facility];  Service: Cardiovascular;  Laterality: N/A;  . PARTIAL HYSTERECTOMY  1971  . TONSILLECTOMY       Medications: No outpatient medications have been marked as taking for the 12/29/18 encounter (Appointment) with Servando Snare,  Marlane Hatcher, NP.     Allergies: Allergies  Allergen Reactions  . Acyclovir And Related   . Allegra [Fexofenadine] Hives  . Amoxicillin Hives  . Codeine Nausea And Vomiting  . Enalapril Maleate Cough  . Keflex [Cephalexin] Hives  . Levofloxacin Other (See Comments)    insomnia  . Lorabid [Loracarbef] Hives  . Sulfa Antibiotics Hives  . Sulfur Hives    Social History: The patient  reports that she quit smoking about 17 years ago. Her smoking use included cigarettes. She has a 21.00 pack-year smoking history. She has never used smokeless tobacco. She reports  that she does not drink alcohol or use drugs.   Family History: The patient's ***family history includes Cancer in her maternal uncle, mother, and paternal aunt; Diabetes in her sister; Heart attack in her father; Hypertension in her mother and sister; Stroke in her mother and sister; Sudden death in her father.   Review of Systems: Please see the history of present illness.   All other systems are reviewed and negative.   Physical Exam: VS:  There were no vitals taken for this visit. Marland Kitchen  BMI There is no height or weight on file to calculate BMI.  Wt Readings from Last 3 Encounters:  10/20/18 158 lb 6.4 oz (71.8 kg)  10/08/18 159 lb 12.8 oz (72.5 kg)  06/28/18 152 lb (68.9 kg)    General: Pleasant. Well developed, well nourished and in no acute distress.   HEENT: Normal.  Neck: Supple, no JVD, carotid bruits, or masses noted.  Cardiac: ***Regular rate and rhythm. No murmurs, rubs, or gallops. No edema.  Respiratory:  Lungs are clear to auscultation bilaterally with normal work of breathing.  GI: Soft and nontender.  MS: No deformity or atrophy. Gait and ROM intact.  Skin: Warm and dry. Color is normal.  Neuro:  Strength and sensation are intact and no gross focal deficits noted.  Psych: Alert, appropriate and with normal affect.   LABORATORY DATA:  EKG:  EKG {ACTION; IS/IS GI:[id number] ordered today. This demonstrates ***.  Lab Results  Component Value Date   WBC 14.0 (H) 02/28/2018   HGB 11.6 (L) 02/28/2018   HCT 37.0 02/28/2018   PLT 382 02/28/2018   GLUCOSE 133 (H) 02/28/2018   CHOL 134 08/31/2018   TRIG 103 08/31/2018   HDL 72 08/31/2018   LDLCALC 41 08/31/2018   ALT 13 08/31/2018   AST 16 02/22/2016   NA 136 02/28/2018   K 3.5 02/28/2018   CL 98 02/28/2018   CREATININE 0.63 02/28/2018   BUN 12 02/28/2018   CO2 29 02/28/2018   TSH 0.34 (L) 10/05/2018   INR 1.0 01/17/2013   HGBA1C 6.3 (A) 10/08/2018     BNP (last 3 results) Recent Labs    02/28/18 1732   BNP 25.5    ProBNP (last 3 results) No results for input(s): PROBNP in the last 8760 hours.   Other Studies Reviewed Today:   Assessment/Plan: ASSESSMENT & PLAN:    Coronary artery calcification/ Aortic atherosclerosis - Mid LAD calcification noted - Prior cardiac catheterization in 2014 showed no obstructive coronary artery disease. - I will go ahead and intensify her rosuvastatin from 5 mg daily up to 20 mg daily, high intensity dose.  I will check ALT and lipid panel and 2 months. -Start aspirin 81 mg once a day.  Family history of coronary artery disease -Sister had CABG, CVA, mother had angina and A. fib.  COPD -Quit smoking in 2002 after  21 pack years.  Uses 2 L nasal cannula at night.  Marland Kitchen COVID-19 Education: The signs and symptoms of COVID-19 were discussed with the patient and how to seek care for testing (follow up with PCP or arrange E-visit).  The importance of social distancing, staying at home, hand hygiene and wearing a mask when out in public were discussed today.  Current medicines are reviewed with the patient today.  The patient does not have concerns regarding medicines other than what has been noted above.  The following changes have been made:  See above.  Labs/ tests ordered today include:   No orders of the defined types were placed in this encounter.    Disposition:   FU with *** in {gen number [id number] {Days to years:10300}.   Patient is agreeable to this plan and will call if any problems develop in the interim.   SignedTruitt Merle, NP  12/23/2018 4:11 PM  [facility] [address] [place], [place]  [address] Phone: [phone] Fax: [phone]

## 2018-12-27 NOTE — Progress Notes (Signed)
Cardiology Office Note:    Date:  12/29/2018   ID:  JANAAN CHAMPOUX, DOB [birth year], MRN [id number]  PCP:  Carol Ada, MD  Cardiologist:  Candee Furbish, MD  Electrophysiologist:  None   Referring MD: Carol Ada, MD   Chief Complaint: follow up of coronary artery atherosclerosis  History of Present Illness:    FALECIA PERRETT is a 78 y.o. female with a history of normal coronaries on cath in 2014 but coronary atherosclerosis of LAD noted on CT scan in 01/2018, hypertension, hyperlipidemia, pre-diabetes, COPD on 2L of O2 at night, hypothyroidism, Raynaud's syndrome, fibromyalgia, and breast cancer who is followed by Dr. Marlou Porch and presents today for follow-up of coronary artery atherosclerosis.  Patient seen in 2014 for follow-up of chest pain with intermediate exercise treadmill test. At that time, she reported more episodes of jaw pain relieved with TUMS. Patient's father had GERD but ended up having CAD as well and dyed suddenly of a heart attack.  had episodes of jaw pain back in 2014. Patient ended up undergoing left heart catheterization at that time which showed no significant flow-limiting CAD. Therefore, decision was made to proceed with left heart catheterization which showed no angiographically significant CAD. Since that time, patient has done well from a cardiac standpoint. She was last seen by Dr. Gillian Shields for a virtual visit in 06/2018 (after not having been seen since 01/2013) at which time she reported some shortness of breath due to her COPD but denied any other cardiac symptoms. CT scan from 01/2018 showed coronary atherosclerosis of LAD. Therefore, Crestor was increased from 5mg  to 20mg  daily and Aspirin 81mg  daily was added. Patient was instructed to follow-up in 6 months.  Most Recent Labs Reviewed: - 08/2018 [facility]): Hgb 12.9, Cr 0.620, K 3.6, Hgb A1c 6.3, Total Chol 130, Trig 109, HDL 64, LDL 44. - 09/2018: TSH 0.34, Free T4 1.2, Free T3 3.0.  Patient presents today for  follow-up. Patient doing well from a cardiac standpoint. She denies any chest pain. She does have chronic dyspnea on exertion due to her COPD which she states has not worsened. She denies any palpitations, lightheadedness, dizziness, orthopnea, or PND. She has occasional swelling of her ankles for which she takes PRN Lasix. She has not needed her Lasix in over 1 month. She has noticed some weight gain and weight is up a couple of pounds since 09/2018. She has a history of GERD and notes heart burn after she has been belching. This is associated with meals. She denies any chest pain with activity. She tries to stay active and walks about 30 minutes twice a week. She states she also tries to watch her diet and eats a lot of frozen vegetables and lean meats but does reports eating at restaurants on the weekend.  Past Medical History:  Diagnosis Date  . Anxiety   . Arthritis   . Asthma in child   . Atherosclerotic heart disease of native coronary artery without angina pectoris   . Cataracts, bilateral   . Combined hyperlipidemia   . Constipation, unspecified   . COPD (chronic obstructive pulmonary disease) ([place])   . Coronary artery spasm ([place])   . Diabetic neuropathy ([place])   . DM (diabetes mellitus) with complications ([place])   . Dupuytren's contracture   . Dyspepsia   . Familial tremor   . Fatigue   . Fibromyalgia syndrome   . GERD (gastroesophageal reflux disease)   . Hashimoto's thyroiditis   . Hiatal hernia   .  History of colon polyps   . Hx of Hashimoto thyroiditis   . Hypercholesteremia   . Hypertension   . Hypothyroidism, acquired, autoimmune   . IBS (irritable bowel syndrome)   . Meniere's disease of left ear   . Migraine without aura and without status migrainosus, not intractable   . Multinodular goiter (nontoxic)   . Nonexudative macular degeneration   . Raynaud's syndrome without gangrene   . Situational stress   . Tachycardia   . Thyroiditis, autoimmune   . Ulcerative  colitis, unspecified, without complications ([place])   . Vitamin B 12 deficiency   . Vitamin D deficiency   . Wears glasses     Past Surgical History:  Procedure Laterality Date  . BREAST EXCISIONAL BIOPSY Left    benign  . BREAST EXCISIONAL BIOPSY Right    milk gland removed  . BREAST LUMPECTOMY  1990   left  . BREAST LUMPECTOMY WITH NEEDLE LOCALIZATION Right 02/28/2013   Procedure: RIGHT BREAST NEEDLE LOCALIZATION LUMPECTOMY ;  Surgeon: Edward Jolly, MD;  Location: [place];  Service: General;  Laterality: Right;  . CHOLECYSTECTOMY  2008   lapcholi  . DILATION AND CURETTAGE OF UTERUS    . LEFT HEART CATHETERIZATION WITH CORONARY ANGIOGRAM N/A 01/26/2013   Procedure: LEFT HEART CATHETERIZATION WITH CORONARY ANGIOGRAM;  Surgeon: Candee Furbish, MD;  Location: [facility] CATH LAB;  Service: Cardiovascular;  Laterality: N/A;  . PARTIAL HYSTERECTOMY  1971  . TONSILLECTOMY      Current Medications: Current Meds  Medication Sig  . aspirin EC 81 MG tablet Take 1 tablet (81 mg total) by mouth daily.  . Cyanocobalamin (VITAMIN B 12 PO) Take by mouth daily.  . DULoxetine (CYMBALTA) 30 MG capsule TAKE 3 CAPSULES BY MOUTH ONCE DAILY ONCE A DAY ORALLY 30  . Fluticasone-Umeclidin-Vilant (TRELEGY ELLIPTA) 100-62.5-25 MCG/INH AEPB Inhale 1 puff into the lungs daily.  . furosemide (LASIX) 20 MG tablet TAKE 1 TABLET BY MOUTH EVERY DAY AS NEEDED FOR SWELLING  . hydrochlorothiazide (HYDRODIURIL) 25 MG tablet TAKE 1 TABLET BY MOUTH EVERY DAY IN THE MORNING  . LORazepam (ATIVAN) 0.5 MG tablet TAKE 1/2 TAB EVERY 8 12 HOURS AS NEEDED FOR ANXIOUSNESS  . losartan (COZAAR) 100 MG tablet Take 100 mg by mouth daily.  . meclizine (ANTIVERT) 25 MG tablet 1/2 to 1 tablet by mouth every 8 hrs as needed for diziness  . Multiple Vitamin (MULTIVITAMIN) tablet Take 1 tablet by mouth daily.    Marland Kitchen omeprazole (PRILOSEC) 20 MG capsule Take 20 mg by mouth daily.  Marland Kitchen Respiratory Therapy Supplies (FLUTTER) DEVI Use  device 2-3 times a day to break up congestion  . rosuvastatin (CRESTOR) 20 MG tablet Take 1 tablet (20 mg total) by mouth daily.  Marland Kitchen SYNTHROID 88 MCG tablet TAKE 1 TABLET (88 MCG TOTAL) BY MOUTH DAILY BEFORE BREAKFAST.  Marland Kitchen VITAMIN D, CHOLECALCIFEROL, PO Take 50,000 Units by mouth once a week.      Allergies:   Acyclovir and related, Allegra [fexofenadine], Amoxicillin, Codeine, Enalapril maleate, Keflex [cephalexin], Levofloxacin, Lorabid [loracarbef], Sulfa antibiotics, and Sulfur   Social History   Socioeconomic History  . Marital status: Married    Spouse name: edward  . Number of children: 2  . Years of education: 12th  . Highest education level: Not on file  Occupational History    Employer: [employer]  Social Needs  . Financial resource strain: Not on file  . Food insecurity    Worry: Not on file  Inability: Not on file  . Transportation needs    Medical: Not on file    Non-medical: Not on file  Tobacco Use  . Smoking status: Former Smoker    Packs/day: 0.50    Years: 42.00    Pack years: 21.00    Types: Cigarettes    Quit date: 02/21/2001    Years since quitting: 17.8  . Smokeless tobacco: Never Used  Substance and Sexual Activity  . Alcohol use: No  . Drug use: No  . Sexual activity: Not on file  Lifestyle  . Physical activity    Days per week: Not on file    Minutes per session: Not on file  . Stress: Not on file  Relationships  . Social Herbalist on phone: Not on file    Gets together: Not on file    Attends religious service: Not on file    Active member of club or organization: Not on file    Attends meetings of clubs or organizations: Not on file    Relationship status: Not on file  Other Topics Concern  . Not on file  Social History Narrative   Patient lives at home with the husband.. Patient drinks coffee     Family History: The patient's family history includes Cancer in her maternal uncle, mother, and paternal aunt;  Diabetes in her sister; Heart attack in her father; Hypertension in her mother and sister; Stroke in her mother and sister; Sudden death in her father. There is no history of Thyroid disease.  ROS:   Please see the history of present illness.    All other systems reviewed and are negative.  EKGs/Labs/Other Studies Reviewed:    The following studies were reviewed today:  Left Cardiac Catheterization 01/26/2013: Impressions: 1. No angiographically significant CAD 2. Normal left ventricular systolic function.  LVEDP 14 mmHg.  Ejection fraction 65 %. _______________  Chest CT 01/28/2018: Findings: Cardiovascular: Normal heart size. No significant pericardial effusion/thickening. Left anterior descending coronary atherosclerosis. Atherosclerotic nonaneurysmal thoracic aorta. Normal caliber pulmonary arteries.  Impressions: 1. Right middle lobe small solid pulmonary nodules are stable to decreased since 02/11/2017 chest CT, considered benign. No new significant pulmonary nodules. 2. Stable minimal scattered cylindrical bronchiectasis in medial upper lobes and medial right middle lobe. No evidence of active pulmonary infection. 3. One vessel coronary atherosclerosis. 4. Small hiatal hernia.  EKG:  EKG ordered today. EKG personally reviewed and demonstrates normal sinus rhythm, rate 84 bpm, with non-specific ST/T changes.  Recent Labs: 02/28/2018: B Natriuretic Peptide 25.5; BUN 12; Creatinine, Ser 0.63; Hemoglobin 11.6; Platelets 382; Potassium 3.5; Sodium 136 08/31/2018: ALT 13 10/05/2018: TSH 0.34  Recent Lipid Panel    Component Value Date/Time   CHOL 134 08/31/2018 0919   TRIG 103 08/31/2018 0919   HDL 72 08/31/2018 0919   CHOLHDL 1.9 08/31/2018 0919   LDLCALC 41 08/31/2018 0919    Physical Exam:    Vital Signs: BP 122/70   Pulse 94   Ht 5' 3.5" (1.613 m)   Wt 160 lb (72.6 kg)   SpO2 96%   BMI 27.90 kg/m     Wt Readings from Last 3 Encounters:  12/29/18 160 lb (72.6  kg)  10/20/18 158 lb 6.4 oz (71.8 kg)  10/08/18 159 lb 12.8 oz (72.5 kg)     General: 78 y.o. Caucasian female in no acute distress. HEENT: Normocephalic and atraumatic. Sclera clear. EOMs intact. Neck: Supple. No carotid bruits. No JVD. Heart: RRR. Distinct S1  and S2. No murmurs, gallops, or rubs. Radial and distal pedal pulses 2+ and equal bilaterally. Lungs: No increased work of breathing. Clear to ausculation bilaterally. No wheezes, rhonchi, or rales.   Abdomen: Soft, non-distended, and non-tender to palpation. Bowel sounds present. MSK: Normal strength and tone for age. Extremities: Trace lower extremity edema.    Skin: Warm and dry. Neuro: Alert and oriented x3. No focal deficits. Psych: Normal affect. Responds appropriately.   Assessment:    1. Atherosclerosis of native coronary artery of native heart without angina pectoris   2. Essential hypertension   3. Hyperlipidemia, unspecified hyperlipidemia type   4. Pre-diabetes   5. Family history of early CAD   21. Gastroesophageal reflux disease without esophagitis   7. Chronic obstructive pulmonary disease, unspecified COPD type ([place])   8. Hypothyroidism, unspecified type     Plan:    Coronary Artery Atherosclerosis  - Last cardiac cath in 2014 showed no angiographically significant CAD. However, coronary atherosclerosis of LAD noted on chest CT in 01/2018.  - No acute changes on EKG. - No angina. - Continue aspirin and statin.  - Continue primary prevention with aggressive risk factor/lifesytle modifications. Discussed increasing activity to 150 minutes per week and following heart healthy diet.     Hypertension - BP well controlled - 122/70 today. - Continue Losartan 100mg  daily and HCTZ 25mg  daily. Continue Lasix as needed for lower extremity edema (she does not need this frequently). - Renal function and potassium stable on labs in 08/2018 ([facility]  Hyperlipidemia - Lipid panel from 08/2018: Total Cholesterol  134, Triglycerides 103, HDL 72, LDL 41.  - At LDL goal <70 given CAD. - Continue Crestor 20mg  daily.  - Labs followed by PCP.  Pre-Diabetes - Hemoglobin A1c 6.3 in 08/2018. - Recommended increased physical activity and heart healthy diet. - Followed by PCP.  Family History of CAD - Father died of sudden heart attack in his 59's and sister had multiple heart attacks and strokes before dying in her 87's. - Continue aggressive risk factor/lifestyle modifications as above.  GERD - Controlled on Omeprazole.  COPD - Patient has 21 pack year smoking history but quit in 2002. On 2L of supplemental O2 via nasal cannula at night.  - Followed by Pulmonology.  Hypothyroidism - Followed by PCP.  Disposition: Follow up in 1 year with Dr. Marlou Porch.   Medication Adjustments/Labs and Tests Ordered: Current medicines are reviewed at length with the patient today.  Concerns regarding medicines are outlined above.  Orders Placed This Encounter  Procedures  . EKG 12-Lead   No orders of the defined types were placed in this encounter.   Patient Instructions  Medication Instructions:  Your physician recommends that you continue on your current medications as directed. Please refer to the Current Medication list given to you today.  *If you need a refill on your cardiac medications before your next appointment, please call your pharmacy*  Lab Work: -None  If you have labs (blood work) drawn today and your tests are completely normal, you will receive your results only by: Marland Kitchen MyChart Message (if you have MyChart) OR . A paper copy in the mail If you have any lab test that is abnormal or we need to change your treatment, we will call you to review the results.  Testing/Procedures: -None  Follow-Up: At [facility], you and your health needs are our priority.  As part of our continuing mission to provide you with exceptional heart care, we have created  designated Provider Care Teams.  These  Care Teams include your primary Cardiologist (physician) and Advanced Practice Providers (APPs -  Physician Assistants and Nurse Practitioners) who all work together to provide you with the care you need, when you need it.  Your next appointment:   12 months  The format for your next appointment:   In Person  Provider:   Candee Furbish, MD  Other Instructions -None     Signed, Darreld Mclean, PA-C  12/29/2018 2:36 PM    [place]

## 2018-12-29 ENCOUNTER — Other Ambulatory Visit: Payer: Self-pay

## 2018-12-29 ENCOUNTER — Ambulatory Visit: Payer: Medicare Other | Admitting: Student

## 2018-12-29 ENCOUNTER — Encounter: Payer: Self-pay | Admitting: Student

## 2018-12-29 VITALS — BP 122/70 | HR 94 | Ht 63.5 in | Wt 160.0 lb

## 2018-12-29 DIAGNOSIS — J449 Chronic obstructive pulmonary disease, unspecified: Secondary | ICD-10-CM

## 2018-12-29 DIAGNOSIS — I10 Essential (primary) hypertension: Secondary | ICD-10-CM

## 2018-12-29 DIAGNOSIS — E785 Hyperlipidemia, unspecified: Secondary | ICD-10-CM | POA: Diagnosis not present

## 2018-12-29 DIAGNOSIS — R7303 Prediabetes: Secondary | ICD-10-CM | POA: Diagnosis not present

## 2018-12-29 DIAGNOSIS — I2510 Atherosclerotic heart disease of native coronary artery without angina pectoris: Secondary | ICD-10-CM

## 2018-12-29 DIAGNOSIS — Z8249 Family history of ischemic heart disease and other diseases of the circulatory system: Secondary | ICD-10-CM

## 2018-12-29 DIAGNOSIS — K219 Gastro-esophageal reflux disease without esophagitis: Secondary | ICD-10-CM

## 2018-12-29 DIAGNOSIS — E039 Hypothyroidism, unspecified: Secondary | ICD-10-CM

## 2018-12-29 NOTE — Patient Instructions (Addendum)
Medication Instructions:  Your physician recommends that you continue on your current medications as directed. Please refer to the Current Medication list given to you today.  *If you need a refill on your cardiac medications before your next appointment, please call your pharmacy*  Lab Work: -None  If you have labs (blood work) drawn today and your tests are completely normal, you will receive your results only by: Marland Kitchen MyChart Message (if you have MyChart) OR . A paper copy in the mail If you have any lab test that is abnormal or we need to change your treatment, we will call you to review the results.  Testing/Procedures: -None  Follow-Up: At [facility], you and your health needs are our priority.  As part of our continuing mission to provide you with exceptional heart care, we have created designated Provider Care Teams.  These Care Teams include your primary Cardiologist (physician) and Advanced Practice Providers (APPs -  Physician Assistants and Nurse Practitioners) who all work together to provide you with the care you need, when you need it.  Your next appointment:   12 months  The format for your next appointment:   In Person  Provider:   Candee Furbish, MD  Other Instructions -None

## 2019-01-01 ENCOUNTER — Other Ambulatory Visit (INDEPENDENT_AMBULATORY_CARE_PROVIDER_SITE_OTHER): Payer: Self-pay | Admitting: "Endocrinology

## 2019-01-01 DIAGNOSIS — E034 Atrophy of thyroid (acquired): Secondary | ICD-10-CM

## 2019-01-12 LAB — HEMOGLOBIN A1C
Hgb A1c MFr Bld: 6.2 % of total Hgb — ABNORMAL HIGH (ref <5.7)
Mean Plasma Glucose: 131 (calc)
eAG (mmol/L): 7.3 (calc)

## 2019-01-12 LAB — T3, FREE: T3, Free: 3 pg/mL (ref 2.3–4.2)

## 2019-01-12 LAB — T4, FREE: Free T4: 1.3 ng/dL (ref 0.8–1.8)

## 2019-01-12 LAB — VITAMIN D 25 HYDROXY (VIT D DEFICIENCY, FRACTURES): Vit D, 25-Hydroxy: 58 ng/mL (ref 30–100)

## 2019-01-12 LAB — PTH, INTACT AND CALCIUM
Calcium: 10 mg/dL (ref 8.6–10.4)
PTH: 24 pg/mL (ref 14–64)

## 2019-01-12 LAB — TSH: TSH: 2.72 mIU/L (ref 0.40–4.50)

## 2019-01-17 ENCOUNTER — Encounter (INDEPENDENT_AMBULATORY_CARE_PROVIDER_SITE_OTHER): Payer: Self-pay | Admitting: *Deleted

## 2019-01-19 ENCOUNTER — Encounter (INDEPENDENT_AMBULATORY_CARE_PROVIDER_SITE_OTHER): Payer: Self-pay

## 2019-02-21 ENCOUNTER — Ambulatory Visit: Payer: Medicare Other | Admitting: Pulmonary Disease

## 2019-02-22 NOTE — Progress Notes (Signed)
Virtual Visit via Telephone Note  I connected with Shelby Lin on 02/23/19 at  9:00 AM EST by telephone and verified that I am speaking with the correct person using two identifiers.  Location: Patient: Home Provider: [facility] - [id number], [place], [place], [place] [address]   I discussed the limitations, risks, security and privacy concerns of performing an evaluation and management service by telephone and the availability of in person appointments. I also discussed with the patient that there may be a patient responsible charge related to this service. The patient expressed understanding and agreed to proceed.  Patient consented to consult via telephone: Yes People present and their role in pt care: Pt   History of Present Illness:  78 year old female former smoker followed in our office for gold 3 COPD  PMH: Hypothyroidism due to Hashimoto's thyroiditis (managed by endocrinology [name]) Smoker/ Smoking History: Former smoker.  Quit 2002.  21-pack-year smoking history. Maintenance: Trelegy Ellipta 100 Pt of: [name]  Chief complaint: 4 month follow up    78 year old female former smoker completing a 19-month follow-up with our office.  Patient has completed follow-up with [facility].  Her previous gastroenterologist has retired she saw the [facility].  They have increased her PPI.  She continues to have aspects of GERD.  She was also started on MiraLAX.  She reports her breathing has overall been stable.  Patient reports she continues to use her flutter valve and her Trelegy Ellipta.  She feels like it has been harder for her to utilize her flutter valve lately.  She continues to not bring up mucus.  She is only been using her rescue inhaler maybe about 1 time a month.  She feels that she is having some increased shortness of breath and occasional wheezing.  She wheezes at her baseline.  She has not been as active lately, she also feels that she is  gaining some weight.  She continues to practice social distancing in the 3W's due to the SARS-CoV-2 pandemic.    Observations/Objective:  02/28/2018-chest x-ray- COPD changes without acute infiltrate, emphysematous and central bronchitic changes consistent with COPD  01/28/2018-CT chest without contrast-right middle lobe small solid pulmonary nodules are stable decreasing comparison at 02/11/2017 chest CT, considered benign, stable minimal scattered cylindrical bronchiectasis medial upper lobes and medial right middle lobe  02/11/2017-CT chest high-res- minimal scattered cylindrical bronchiectasis with associated minimal tree-in-bud opacity in the medial upper lobes and medial right middle lobes, findings could be consistent of MAI >>>Mild to moderate centrilobular emphysema with mild diffuse bronchial wall thickening, 2 small solid pulmonary nodules in right lung largest with average diameter 5 mm in the right middle lobe  04/01/2017-pulmonary function test- FVC 1.69 (61% predicted), postbronchodilator ratio 48, postbronchodilator FEV1 46, positive bronchodilator response, mid flow reversibility after bronchodilator, DLCO 52 >>>Severe obstructive airways disease >>>Patient did take Incruse 1 puff at 8 AM prior to testing  03/30/2018-overnight oximetry- less than 88% for 43 minutes and 43 seconds, less than 89% for an hour and 36 minutes of 51 seconds, baseline SPO2 of 91%  Social History   Tobacco Use  Smoking Status Former Smoker  . Packs/day: 0.50  . Years: 42.00  . Pack years: 21.00  . Types: Cigarettes  . Quit date: 02/21/2001  . Years since quitting: 18.0  Smokeless Tobacco Never Used   Immunization History  Administered Date(s) Administered  . Influenza, High Dose Seasonal PF 12/12/2016, 11/27/2017, 12/14/2018  . Pneumococcal Conjugate-13  10/16/2017  . Pneumococcal Polysaccharide-23 10/20/2018      Assessment and Plan:  Gastroesophageal reflux disease Plan: Continue  ppi Continue follow-up with [facility] to further evaluate your dysphasia as well as management of GERD  Stage 3 severe COPD by GOLD classification ([place]) Plan: Z-Pak sent today Course of prednisone sent Discussed watchful waiting with the patient if symptoms worsen she can pick up the Z-Pak and prednisone from the emergency room.  This is being sent to help prevent patient having to utilize an urgent care or an emergency room over the holiday weekend, patient knows to notify our office via [place] if she has to start utilizing these medications Continue Trelegy Ellipta Continue rescue inhaler as needed for shortness of breath or wheezing every 6 hours, new spacer provided today Start increasing daily physical activity as able Follow-up with our office in 4 weeks  Nocturnal hypoxemia due to emphysema ([place]) Plan: Continue nighttime oxygen use Okay to take breaks if it is affecting her sleep  Bronchiectasis without complication ([place]) Plan: Continue flutter valve use  Physical deconditioning Plan: Increase physical activity daily   Follow Up Instructions:  Return in about 30 days (around 03/25/2019), or if symptoms worsen or fail to improve, for Follow up with [name] FNP-C.   I discussed the assessment and treatment plan with the patient. The patient was provided an opportunity to ask questions and all were answered. The patient agreed with the plan and demonstrated an understanding of the instructions.   The patient was advised to call back or seek an in-person evaluation if the symptoms worsen or if the condition fails to improve as anticipated.  I provided 29 minutes of non-face-to-face time during this encounter.   Shelby Rinne, NP

## 2019-02-23 ENCOUNTER — Ambulatory Visit (INDEPENDENT_AMBULATORY_CARE_PROVIDER_SITE_OTHER): Payer: Medicare Other | Admitting: Pulmonary Disease

## 2019-02-23 ENCOUNTER — Encounter: Payer: Self-pay | Admitting: Pulmonary Disease

## 2019-02-23 ENCOUNTER — Other Ambulatory Visit: Payer: Self-pay

## 2019-02-23 DIAGNOSIS — J479 Bronchiectasis, uncomplicated: Secondary | ICD-10-CM | POA: Diagnosis not present

## 2019-02-23 DIAGNOSIS — R5381 Other malaise: Secondary | ICD-10-CM

## 2019-02-23 DIAGNOSIS — J449 Chronic obstructive pulmonary disease, unspecified: Secondary | ICD-10-CM

## 2019-02-23 DIAGNOSIS — G4736 Sleep related hypoventilation in conditions classified elsewhere: Secondary | ICD-10-CM

## 2019-02-23 DIAGNOSIS — J439 Emphysema, unspecified: Secondary | ICD-10-CM | POA: Diagnosis not present

## 2019-02-23 DIAGNOSIS — K219 Gastro-esophageal reflux disease without esophagitis: Secondary | ICD-10-CM

## 2019-02-23 MED ORDER — AZITHROMYCIN 250 MG PO TABS: ORAL_TABLET | ORAL | 0 refills | Status: DC

## 2019-02-23 MED ORDER — PREDNISONE 10 MG PO TABS: ORAL_TABLET | ORAL | 0 refills | Status: DC

## 2019-02-23 NOTE — Assessment & Plan Note (Signed)
Plan: Increase physical activity daily

## 2019-02-23 NOTE — Assessment & Plan Note (Signed)
Plan: Continue ppi Continue follow-up with [facility] to further evaluate your dysphasia as well as management of GERD

## 2019-02-23 NOTE — Assessment & Plan Note (Signed)
Plan: Z-Pak sent today Course of prednisone sent Discussed watchful waiting with the patient if symptoms worsen she can pick up the Z-Pak and prednisone from the emergency room.  This is being sent to help prevent patient having to utilize an urgent care or an emergency room over the holiday weekend, patient knows to notify our office via [place] if she has to start utilizing these medications Continue Trelegy Ellipta Continue rescue inhaler as needed for shortness of breath or wheezing every 6 hours, new spacer provided today Start increasing daily physical activity as able Follow-up with our office in 4 weeks

## 2019-02-23 NOTE — Patient Instructions (Signed)
You were seen today by Shelby Rinne, NP  for:   1. Stage 3 severe COPD by GOLD classification (HCC)  - azithromycin (ZITHROMAX) 250 MG tablet; 500mg  (two tablets) today, then 250mg  (1 tablet) for the next 4 days  Dispense: 6 tablet; Refill: 0 - predniSONE (DELTASONE) 10 MG tablet; Take 2 tablets (20mg  total) daily for the next 5 days. Take in the AM with food.  Dispense: 10 tablet; Refill: 0  Trelegy Ellipta  >>> 1 puff daily in the morning >>>rinse mouth out after use  >>> This inhaler contains 3 medications that help manage her respiratory status, contact our office if you cannot afford this medication or unable to remain on this medication   2. Nocturnal hypoxemia due to emphysema ([place])   3. Bronchiectasis without complication (HCC)  Bronchiectasis: This is the medical term which indicates that you have damage, dilated airways making you more susceptible to respiratory infection. Use a flutter valve 10 breaths twice a day or 4 to 5 breaths 4-5 times a day to help clear mucus out Let us know if you have cough with change in mucus color or fevers or chills.  At that point you would need an antibiotic. Maintain a healthy nutritious diet, eating whole foods Take your medications as prescribed   Note your daily symptoms > remember "red flags" for COPD:   >>>Increase in cough >>>increase in sputum production >>>increase in shortness of breath or activity  intolerance.   If you notice these symptoms, please call the office to be seen.    4. Gastroesophageal reflux disease, unspecified whether esophagitis present  Continue medications as managed by gastroenterology  GERD management: >>>Avoid laying flat until 2 hours after meals >>>Elevate head of the bed including entire chest >>>Reduce size of meals and amount of fat, acid, spices, caffeine and sweets >>>If you are smoking, Please stop! >>>Decrease alcohol consumption >>>Work on maintaining a healthy weight with normal BMI    5. Physical deconditioning  Increase daily activity    We recommend today:  No orders of the defined types were placed in this encounter.  No orders of the defined types were placed in this encounter.  Meds ordered this encounter  Medications  . azithromycin (ZITHROMAX) 250 MG tablet    Sig: 500mg  (two tablets) today, then 250mg  (1 tablet) for the next 4 days    Dispense:  6 tablet    Refill:  0  . predniSONE (DELTASONE) 10 MG tablet    Sig: Take 2 tablets (20mg  total) daily for the next 5 days. Take in the AM with food.    Dispense:  10 tablet    Refill:  0    Follow Up:    Return in about 30 days (around 03/25/2019), or if symptoms worsen or fail to improve, for Follow up with [name] FNP-C.   Please do your part to reduce the spread of COVID-19:      Reduce your risk of any infection  and COVID19 by using the similar precautions used for avoiding the common cold or flu:  Marland Kitchen Wash your hands often with soap and warm water for at least 20 seconds.  If soap and water are not readily available, use an alcohol-based hand sanitizer with at least 60% alcohol.  . If coughing or sneezing, cover your mouth and nose by coughing or sneezing into the elbow areas of your shirt or coat, into a tissue or into your sleeve (not your hands). Shelby Lin A MASK  when in public  . Avoid shaking hands with others and consider head nods or verbal greetings only. . Avoid touching your eyes, nose, or mouth with unwashed hands.  . Avoid close contact with people who are sick. . Avoid places or events with large numbers of people in one location, like concerts or sporting events. . If you have some symptoms but not all symptoms, continue to monitor at home and seek medical attention if your symptoms worsen. . If you are having a medical emergency, call 911.   [place] / e-Visit: [email or web address]          [facility]: [facility]: [id number]                   [facility]: [id number]     It is flu season:   >>> Best ways to protect herself from the flu: Receive the yearly flu vaccine, practice good hand hygiene washing with soap and also using hand sanitizer when available, eat a nutritious meals, get adequate rest, hydrate appropriately   Please contact the office if your symptoms worsen or you have concerns that you are not improving.   Thank you for choosing [facility] for your healthcare, and for allowing [place] to partner with you on your healthcare journey. I am thankful to be able to provide care to you today.   [name] FNP-C    COPD and Physical Activity Chronic obstructive pulmonary disease (COPD) is a long-term (chronic) condition that affects the lungs. COPD is a general term that can be used to describe many different lung problems that cause lung swelling (inflammation) and limit airflow, including chronic bronchitis and emphysema. The main symptom of COPD is shortness of breath, which makes it harder to do even simple tasks. This can also make it harder to exercise and be active. Talk with your health care provider about treatments to help you breathe better and actions you can take to prevent breathing problems during physical activity. What are the benefits of exercising with COPD? Exercising regularly is an important part of a healthy lifestyle. You can still exercise and do physical activities even though you have COPD. Exercise and physical activity improve your shortness of breath by increasing blood flow (circulation). This causes your heart to pump more oxygen through your body. Moderate exercise can improve your:  Oxygen use.  Energy level.  Shortness of breath.  Strength in your breathing muscles.  Heart health.  Sleep.  Self-esteem and feelings of self-worth.  Depression,  stress, and anxiety levels. Exercise can benefit everyone with COPD. The severity of your disease may affect how hard you can exercise, especially at first, but everyone can benefit. Talk with your health care provider about how much exercise is safe for you, and which activities and exercises are safe for you. What actions can I take to prevent breathing problems during physical activity?  Sign up for a pulmonary rehabilitation program. This type of program may include: ? Education about lung diseases. ? Exercise classes that teach you how to exercise and be more active while improving your breathing. This usually involves:  Exercise using your lower extremities, such as a stationary bicycle.  About 30 minutes of exercise, 2 to 5 times per week, for 6 to 12 weeks  Strength training, such as push ups or leg lifts. ? Nutrition education. ? Group classes in which you can talk  with others who also have COPD and learn ways to manage stress.  If you use an oxygen tank, you should use it while you exercise. Work with your health care provider to adjust your oxygen for your physical activity. Your resting flow rate is different from your flow rate during physical activity.  While you are exercising: ? Take slow breaths. ? Pace yourself and do not try to go too fast. ? Purse your lips while breathing out. Pursing your lips is similar to a kissing or whistling position. ? If doing exercise that uses a quick burst of effort, such as weight lifting:  Breathe in before starting the exercise.  Breathe out during the hardest part of the exercise (such as raising the weights). Where to find support You can find support for exercising with COPD from:  Your health care provider.  A pulmonary rehabilitation program.  Your local health department or community health programs.  Support groups, online or in-person. Your health care provider may be able to recommend support groups. Where to find more  information You can find more information about exercising with COPD from:  American Lung Association: ClassInsider.se.  COPD Foundation: https://www.rivera.net/. Contact a health care provider if:  Your symptoms get worse.  You have chest pain.  You have nausea.  You have a fever.  You have trouble talking or catching your breath.  You want to start a new exercise program or a new activity. Summary  COPD is a general term that can be used to describe many different lung problems that cause lung swelling (inflammation) and limit airflow. This includes chronic bronchitis and emphysema.  Exercise and physical activity improve your shortness of breath by increasing blood flow (circulation). This causes your heart to provide more oxygen to your body.  Contact your health care provider before starting any exercise program or new activity. Ask your health care provider what exercises and activities are safe for you. This information is not intended to replace advice given to you by your health care provider. Make sure you discuss any questions you have with your health care provider. Document Released: 03/05/2017 Document Revised: 06/02/2018 Document Reviewed: 03/05/2017 Elsevier Patient Education  2020 [employer].

## 2019-02-23 NOTE — Assessment & Plan Note (Signed)
Plan: Continue flutter valve use 

## 2019-02-23 NOTE — Assessment & Plan Note (Signed)
Plan: Continue nighttime oxygen use Okay to take breaks if it is affecting her sleep

## 2019-02-28 ENCOUNTER — Telehealth: Payer: Self-pay | Admitting: Pulmonary Disease

## 2019-02-28 NOTE — Telephone Encounter (Signed)
Called and spoke with pt who stated she was with a person 1 week ago and then she found out last night 1/3 that the person she was with had been diagnosed with covid.  Pt said she currently is coughing which began to get worse over the weekend 2 days ago. Pt had a virtual visit with Aaron Edelman 12/30 and was prescribed prednisone and zpak but pt said she was unsure if she was okay to take both meds at the same time. I stated to pt that it is fine for her to take both meds together and she said that she will begin to take the prednisone. Pt said she has two days left of the zpak.  Pt said the mucus did change to yellow-green so this is when she started the zpak.  Pt denies any complaints of fever but does not have a thermometer that she can use to check her temp. Pt said she does not feel feverish.  Pt said that she is fatigued all the time and all she wants to do is sleep. Pt denies any complaints of loss of taste or loss of smell and denies any complaints of worsening SOB. Pt also denies any complaints of nausea/vomiting.   Advised pt that she should get tested for covid due to her symptoms and she verbalized understanding. Provided pt with the info to how she could get an appt scheduled. Nothing further needed.

## 2019-03-01 ENCOUNTER — Ambulatory Visit: Payer: Medicare Other | Attending: Internal Medicine

## 2019-03-01 DIAGNOSIS — Z20822 Contact with and (suspected) exposure to covid-19: Secondary | ICD-10-CM

## 2019-03-03 LAB — NOVEL CORONAVIRUS, NAA: SARS-CoV-2, NAA: DETECTED — AB

## 2019-03-04 ENCOUNTER — Telehealth (INDEPENDENT_AMBULATORY_CARE_PROVIDER_SITE_OTHER): Payer: Medicare Other | Admitting: Pulmonary Disease

## 2019-03-04 DIAGNOSIS — R05 Cough: Secondary | ICD-10-CM | POA: Diagnosis not present

## 2019-03-04 DIAGNOSIS — R5383 Other fatigue: Secondary | ICD-10-CM | POA: Diagnosis not present

## 2019-03-04 DIAGNOSIS — U071 COVID-19: Secondary | ICD-10-CM | POA: Diagnosis not present

## 2019-03-04 NOTE — Telephone Encounter (Signed)
Thank you for contacting the patient.[name], FNP

## 2019-03-04 NOTE — Telephone Encounter (Signed)
03/04/19 1134  Received information from on-call answering service that patient contacted our office to update [place] regarding her positive Covid diagnosis.  Patient tested positive for Covid on 03/01/2019.  Patient reports exposure to Covid positive patients on 02/22/2019 and 02/25/2019.  Patient had developed a worsening cough on 02/26/2019.  Patient started a Z-Pak as well as a course of prednisone.  She is unable to check her temperatures at this time as she does not have a thermometer.  Her oxygen levels at home are 94% on room air.  Her baseline typically runs around 95%.  She did have a discolored productive cough initially but after the azithromycin the color has changed to clear.  Her husband is currently asymptomatic.  Based off patient's age, known COPD diagnosis she likely would be a candidate for the monoclonal antibody infusion.  I have discussed this with her.  She is willing to proceed forward with this treatment if she does not fact qualify.  Counseled the patient to continue to practice physical distancing, wear a mask inside the home, clean high touch surfaces, practice home isolation for 10 to 14 days.  Lay on belly as able. Notify our office if symptoms worsen, fevers develop or oxygen levels are dropping.  If oxygen levels are dropping below 90% consistently you need to seek emergent evaluation.  If temperatures are elevated can use over-the-counter methods such as Tylenol and/or ibuprofen to help with reducing the temperature.  Explained to patient that if spouse starts to show symptoms.  He likely needs to be tested as well.  She reports that she understands.   Assessment: Patient audibly fatigue Occasional dry cough over telephone  Plan: We will reach out to [facility] NP.  To see if active the patient is a candidate for the monoclonal antibody infusion, can also work to get the patient scheduled if so  Will coordinate close follow-up with our office telephonically next  week.  Patient aware to seek emergent evaluation if symptoms are worsening.   This telephone call took 22 minutes to provide patient care, discussed patient symptoms, and discuss assessment and plan of care with patient.

## 2019-03-04 NOTE — Telephone Encounter (Signed)
  I connected by phone with Shelby Lin on 03/04/2019 at 12:13 PM to discuss the potential use of an new treatment for mild to moderate COVID-19 viral infection in non-hospitalized patients.  This patient is a 79 y.o. female that meets the FDA criteria for Emergency Use Authorization of bamlanivimab or casirivimab\imdevimab.  Unfortunately she  Is outside the 10 day window with symptoms.   Continue with plan of treatment , self isolate.   Please contact office for sooner follow up if symptoms do not improve or worsen or seek emergency care     . Shelby Lin 03/04/2019 12:13 PM

## 2019-03-07 NOTE — Telephone Encounter (Signed)
Left message for patient to call back to get scheduled for a televisit this week.

## 2019-03-24 NOTE — Progress Notes (Signed)
@Patient  ID: Shelby Lin, female    DOB: 1940-10-18, 79 y.o.   MRN: [id number]  Chief Complaint  Patient presents with  . Follow-up    SOB, mild dry cough    Referring provider: Carol Ada, MD  HPI:  79 year old female former smoker followed in our office for gold 3 COPD  PMH: Hypothyroidism due to Hashimoto's thyroiditis (managed by endocrinology [name]) Smoker/ Smoking History: Former smoker.  Quit 2002.  21-pack-year smoking history. Maintenance: Trelegy Ellipta 100 Pt of: [name]  03/25/2019  - Visit   79 year old female former smoker followed in our office for COPD.  Patient contacted our office in and reported that she tested positive for Covid on 03/01/2019.  Patient was outside of the symptom onset window of 10 days so she did not qualify for the monoclonal antibody infusion.  Patient reports that since last talking with our practice when she was diagnosed with Covid she has done well.  She continues to have ongoing fatigue as well as a dry cough.   Her husband also tested positive for Covid.  Her daughter and son-in-law also tested positive for Covid.  Lots of people in her friend's group also tested positive around the same time all stemming from a December/29th/2020 lunch where they were all exposed.  She reports the Trelegy Ellipta is managing her breathing well.  She has no issues with affording it.  Questionaires / Pulmonary Flowsheets:   MMRC: mMRC Dyspnea Scale mMRC Score  03/25/2019 1  10/20/2018 3    Tests:   03/01/2019-SARS-CoV-2-positive  02/28/2018-chest x-ray- COPD changes without acute infiltrate, emphysematous and central bronchitic changes consistent with COPD  01/28/2018-CT chest without contrast-right middle lobe small solid pulmonary nodules are stable decreasing comparison at 02/11/2017 chest CT, considered benign, stable minimal scattered cylindrical bronchiectasis medial upper lobes and medial right middle lobe  02/11/2017-CT chest  high-res- minimal scattered cylindrical bronchiectasis with associated minimal tree-in-bud opacity in the medial upper lobes and medial right middle lobes, findings could be consistent of MAI >>>Mild to moderate centrilobular emphysema with mild diffuse bronchial wall thickening, 2 small solid pulmonary nodules in right lung largest with average diameter 5 mm in the right middle lobe  04/01/2017-pulmonary function test- FVC 1.69 (61% predicted), postbronchodilator ratio 48, postbronchodilator FEV1 46, positive bronchodilator response, mid flow reversibility after bronchodilator, DLCO 52 >>>Severe obstructive airways disease >>>Patient did take Incruse 1 puff at 8 AM prior to testing  03/30/2018-overnight oximetry- less than 88% for 43 minutes and 43 seconds, less than 89% for an hour and 36 minutes of 51 seconds, baseline SPO2 of 91%  FENO:  No results found for: NITRICOXIDE  PFT: PFT Results Latest Ref Rng & Units 04/01/2017  FVC-Pre L 1.69  FVC-Predicted Pre % 61  FVC-Post L 2.02  FVC-Predicted Post % 73  Pre FEV1/FVC % % 45  Post FEV1/FCV % % 48  FEV1-Pre L 0.76  FEV1-Predicted Pre % 36  FEV1-Post L 0.96  DLCO UNC% % 52  DLCO COR %Predicted % 84    WALK:  SIX MIN WALK 03/25/2019 03/15/2018 02/11/2017  Supplimental Oxygen during Test? (L/min) No No No  Tech Comments: moderate paced walk, SOB and wheezing at third lap patient walked at a moderate pace, patient walked all three laps no SOB, dizziness or lightheadedness ambulated at steady pace, no SOB    Imaging: DG Chest 2 View  Result Date: 03/25/2019 CLINICAL DATA:  COVID-19 diagnosis.  Follow-up.  COPD. EXAM: CHEST - 2 VIEW COMPARISON:  Two-view chest x-ray 04/26/2018 FINDINGS: The heart size and mediastinal contours are within normal limits. Both lungs are clear. The visualized skeletal structures are unremarkable. IMPRESSION: Negative two view chest x-ray Electronically Signed   By: San Morelle M.D.   On: 03/25/2019 10:13     Lab Results:  CBC    Component Value Date/Time   WBC 14.0 (H) 02/28/2018 1732   RBC 3.93 02/28/2018 1732   HGB 11.6 (L) 02/28/2018 1732   HCT 37.0 02/28/2018 1732   PLT 382 02/28/2018 1732   MCV 94.1 02/28/2018 1732   MCH 29.5 02/28/2018 1732   MCHC 31.4 02/28/2018 1732   RDW 12.3 02/28/2018 1732   LYMPHSABS 2.9 02/28/2018 1732   MONOABS 1.2 (H) 02/28/2018 1732   EOSABS 0.3 02/28/2018 1732   BASOSABS 0.1 02/28/2018 1732    BMET    Component Value Date/Time   NA 136 02/28/2018 1732   K 3.5 02/28/2018 1732   CL 98 02/28/2018 1732   CO2 29 02/28/2018 1732   GLUCOSE 133 (H) 02/28/2018 1732   BUN 12 02/28/2018 1732   CREATININE 0.63 02/28/2018 1732   CREATININE 0.59 (L) 02/22/2016 0001   CALCIUM 10.0 01/11/2019 0917   CALCIUM 9.7 04/06/2012 1019   GFRNONAA >60 02/28/2018 1732   GFRAA >60 02/28/2018 1732    BNP    Component Value Date/Time   BNP 25.5 02/28/2018 1732    ProBNP No results found for: PROBNP  Specialty Problems      Pulmonary Problems   Stage 3 severe COPD by GOLD classification ([place])    02/11/2017-CT chest high-res- minimal scattered cylindrical bronchiectasis with associated minimal tree-in-bud opacity in the medial upper lobes and medial right middle lobes, findings could be consistent of MAI >>>Mild to moderate centrilobular emphysema with mild diffuse bronchial wall thickening, 2 small solid pulmonary nodules in right lung largest with average diameter 5 mm in the right middle lobe  04/01/2017-pulmonary function test- FVC 1.69 (61% predicted), postbronchodilator ratio 48, postbronchodilator FEV1 46, positive bronchodilator response, mid flow reversibility after bronchodilator, DLCO 52 >>>Severe obstructive airways disease >>>Patient did take Incruse 1 puff at 8 AM prior to testing  02/28/2018-chest x-ray- COPD changes without acute infiltrate, emphysematous and central bronchitic changes consistent with COPD  02/28/2018 - ED - COPD exacerbation        Bronchiectasis with acute exacerbation (HCC)    02/11/2017-CT chest high-res- minimal scattered cylindrical bronchiectasis with associated minimal tree-in-bud opacity in the medial upper lobes and medial right middle lobes, findings could be consistent of MAI >>>Mild to moderate centrilobular emphysema with mild diffuse bronchial wall thickening, 2 small solid pulmonary nodules in right lung largest with average diameter 5 mm in the right middle lobe  01/28/2018-CT chest without contrast-right middle lobe small solid pulmonary nodules are stable decreasing comparison at 02/11/2017 chest CT, considered benign, stable minimal scattered cylindrical bronchiectasis medial upper lobes and medial right middle lobe      Bronchiectasis without complication (HCC)    [id number] chest high-res- minimal scattered cylindrical bronchiectasis with associated minimal tree-in-bud opacity in the medial upper lobes and medial right middle lobes, findings could be consistent of MAI >>>Mild to moderate centrilobular emphysema with mild diffuse bronchial wall thickening, 2 small solid pulmonary nodules in right lung largest with average diameter 5 mm in the right middle lobe      Nocturnal hypoxemia due to emphysema (HCC)    03/30/2018-overnight oximetry- less than 88% for 43 minutes and 43 seconds, less than 89% for an  hour and 36 minutes of 51 seconds, baseline SPO2 of 91%         Allergies  Allergen Reactions  . Acyclovir And Related   . Allegra [Fexofenadine] Hives  . Amoxicillin Hives  . Codeine Nausea And Vomiting  . Enalapril Maleate Cough  . Keflex [Cephalexin] Hives  . Levofloxacin Other (See Comments)    insomnia  . Lorabid [Loracarbef] Hives  . Sulfa Antibiotics Hives  . Sulfur Hives    Immunization History  Administered Date(s) Administered  . Influenza, High Dose Seasonal PF 12/12/2016, 11/27/2017, 12/14/2018  . Pneumococcal Conjugate-13 10/16/2017  . Pneumococcal Polysaccharide-23  10/20/2018    Past Medical History:  Diagnosis Date  . Anxiety   . Arthritis   . Asthma in child   . Atherosclerotic heart disease of native coronary artery without angina pectoris   . Cataracts, bilateral   . Combined hyperlipidemia   . Constipation, unspecified   . COPD (chronic obstructive pulmonary disease) ([place])   . Coronary artery spasm ([place])   . Diabetic neuropathy ([place])   . DM (diabetes mellitus) with complications ([place])   . Dupuytren's contracture   . Dyspepsia   . Familial tremor   . Fatigue   . Fibromyalgia syndrome   . GERD (gastroesophageal reflux disease)   . Hashimoto's thyroiditis   . Hiatal hernia   . History of colon polyps   . Hx of Hashimoto thyroiditis   . Hypercholesteremia   . Hypertension   . Hypothyroidism, acquired, autoimmune   . IBS (irritable bowel syndrome)   . Meniere's disease of left ear   . Migraine without aura and without status migrainosus, not intractable   . Multinodular goiter (nontoxic)   . Nonexudative macular degeneration   . Raynaud's syndrome without gangrene   . Situational stress   . Tachycardia   . Thyroiditis, autoimmune   . Ulcerative colitis, unspecified, without complications ([place])   . Vitamin B 12 deficiency   . Vitamin D deficiency   . Wears glasses     Tobacco History: Social History   Tobacco Use  Smoking Status Former Smoker  . Packs/day: 0.50  . Years: 42.00  . Pack years: 21.00  . Types: Cigarettes  . Quit date: 02/21/2001  . Years since quitting: 18.0  Smokeless Tobacco Never Used   Counseling given: Yes  Continue to not smoke  Outpatient Encounter Medications as of 03/25/2019  Medication Sig  . aspirin EC 81 MG tablet Take 1 tablet (81 mg total) by mouth daily.  . Cyanocobalamin (VITAMIN B 12 PO) Take by mouth daily.  . DULoxetine (CYMBALTA) 30 MG capsule TAKE 3 CAPSULES BY MOUTH ONCE DAILY ONCE A DAY ORALLY 30  . Fluticasone-Umeclidin-Vilant (TRELEGY ELLIPTA) 100-62.5-25 MCG/INH AEPB Inhale  1 puff into the lungs daily.  . furosemide (LASIX) 20 MG tablet TAKE 1 TABLET BY MOUTH EVERY DAY AS NEEDED FOR SWELLING  . hydrochlorothiazide (HYDRODIURIL) 25 MG tablet TAKE 1 TABLET BY MOUTH EVERY DAY IN THE MORNING  . LORazepam (ATIVAN) 0.5 MG tablet TAKE 1/2 TAB EVERY 8 12 HOURS AS NEEDED FOR ANXIOUSNESS  . losartan (COZAAR) 100 MG tablet Take 100 mg by mouth daily.  . meclizine (ANTIVERT) 25 MG tablet 1/2 to 1 tablet by mouth every 8 hrs as needed for diziness  . Multiple Vitamin (MULTIVITAMIN) tablet Take 1 tablet by mouth daily.    Marland Kitchen omeprazole (PRILOSEC) 20 MG capsule Take 20 mg by mouth daily.  Marland Kitchen Respiratory Therapy Supplies (FLUTTER) DEVI Use device 2-3 times  a day to break up congestion  . rosuvastatin (CRESTOR) 20 MG tablet Take 1 tablet (20 mg total) by mouth daily.  Marland Kitchen SYNTHROID 88 MCG tablet TAKE 1 TABLET (88 MCG TOTAL) BY MOUTH DAILY BEFORE BREAKFAST.  Marland Kitchen VITAMIN D, CHOLECALCIFEROL, PO Take 50,000 Units by mouth once a week.   . [DISCONTINUED] azithromycin (ZITHROMAX) 250 MG tablet 500mg  (two tablets) today, then 250mg  (1 tablet) for the next 4 days  . [DISCONTINUED] predniSONE (DELTASONE) 10 MG tablet Take 2 tablets (20mg  total) daily for the next 5 days. Take in the AM with food.   No facility-administered encounter medications on file as of 03/25/2019.     Review of Systems  Review of Systems  Constitutional: Positive for fatigue. Negative for activity change and fever.  HENT: Negative for sinus pressure, sinus pain and sore throat.   Respiratory: Positive for cough. Negative for shortness of breath and wheezing.   Cardiovascular: Negative for chest pain and palpitations.  Gastrointestinal: Negative for diarrhea, nausea and vomiting.       Persistent GERD despite twice daily 40 mg omeprazole use  Musculoskeletal: Negative for arthralgias.  Neurological: Negative for dizziness.  Psychiatric/Behavioral: Negative for sleep disturbance. The patient is not nervous/anxious.       Physical Exam  BP 130/64 (BP Location: Left Arm, Cuff Size: Normal)   Pulse 84   Temp (!) 97 F (36.1 C) (Temporal)   Ht 5' 4.5" (1.638 m)   Wt 161 lb (73 kg)   SpO2 96% Comment: RA  BMI 27.21 kg/m   Wt Readings from Last 5 Encounters:  03/25/19 161 lb (73 kg)  12/29/18 160 lb (72.6 kg)  10/20/18 158 lb 6.4 oz (71.8 kg)  10/08/18 159 lb 12.8 oz (72.5 kg)  06/28/18 152 lb (68.9 kg)    BMI Readings from Last 5 Encounters:  03/25/19 27.21 kg/m  12/29/18 27.90 kg/m  10/20/18 27.62 kg/m  10/08/18 27.86 kg/m  06/28/18 26.50 kg/m     Physical Exam Vitals and nursing note reviewed.  Constitutional:      General: She is not in acute distress.    Appearance: Normal appearance.  HENT:     Head: Normocephalic and atraumatic.     Right Ear: Tympanic membrane, ear canal and external ear normal. There is no impacted cerumen.     Left Ear: Tympanic membrane, ear canal and external ear normal. There is no impacted cerumen.     Nose: Nose normal. No congestion.     Mouth/Throat:     Mouth: Mucous membranes are moist.     Pharynx: Oropharynx is clear.  Eyes:     Pupils: Pupils are equal, round, and reactive to light.  Cardiovascular:     Rate and Rhythm: Normal rate and regular rhythm.     Pulses: Normal pulses.     Heart sounds: Normal heart sounds. No murmur.  Pulmonary:     Effort: Pulmonary effort is normal. No respiratory distress.     Breath sounds: Normal breath sounds. No decreased air movement. No decreased breath sounds, wheezing (Slight expiratory wheeze on exam, expiratory wheeze towards last lap of walk) or rales.  Musculoskeletal:     Cervical back: Normal range of motion.  Skin:    General: Skin is warm and dry.     Capillary Refill: Capillary refill takes less than 2 seconds.  Neurological:     General: No focal deficit present.     Mental Status: She is alert and oriented to person, place,  and time. Mental status is at baseline.     Gait: Gait  (Tolerated walk in office) normal.  Psychiatric:        Mood and Affect: Mood normal.        Behavior: Behavior normal.        Thought Content: Thought content normal.        Judgment: Judgment normal.       Assessment & Plan:   Stage 3 severe COPD by GOLD classification ([place]) Plan: Continue Trelegy Ellipta Walk today in office, tolerated well Chest x-ray today Follow-up in our office in 3 months  COVID-19 virus detected Status post Covid, no longer infectious  Plan: Chest x-ray today Walk today in office  Gastroesophageal reflux disease Plan: Continue to follow-up with [facility]     Return in about 3 months (around 06/23/2019), or if symptoms worsen or fail to improve, for Follow up with Dr. Purnell Shoemaker, Follow up with [name] FNP-C.   Lauraine Rinne, NP 03/25/2019   This appointment required 32 minutes of patient care (this includes precharting, chart review, review of results, face-to-face care, etc.).

## 2019-03-25 ENCOUNTER — Encounter: Payer: Self-pay | Admitting: Pulmonary Disease

## 2019-03-25 ENCOUNTER — Other Ambulatory Visit: Payer: Self-pay

## 2019-03-25 ENCOUNTER — Ambulatory Visit (INDEPENDENT_AMBULATORY_CARE_PROVIDER_SITE_OTHER): Payer: Medicare Other | Admitting: Pulmonary Disease

## 2019-03-25 ENCOUNTER — Ambulatory Visit (INDEPENDENT_AMBULATORY_CARE_PROVIDER_SITE_OTHER): Payer: Medicare Other

## 2019-03-25 VITALS — BP 130/64 | HR 84 | Temp 97.0°F | Ht 64.5 in | Wt 161.0 lb

## 2019-03-25 DIAGNOSIS — J449 Chronic obstructive pulmonary disease, unspecified: Secondary | ICD-10-CM

## 2019-03-25 DIAGNOSIS — K219 Gastro-esophageal reflux disease without esophagitis: Secondary | ICD-10-CM | POA: Diagnosis not present

## 2019-03-25 DIAGNOSIS — U071 COVID-19: Secondary | ICD-10-CM

## 2019-03-25 NOTE — Assessment & Plan Note (Signed)
Status post Covid, no longer infectious  Plan: Chest x-ray today Walk today in office

## 2019-03-25 NOTE — Assessment & Plan Note (Signed)
Plan: Continue Trelegy Ellipta Walk today in office, tolerated well Chest x-ray today Follow-up in our office in 3 months

## 2019-03-25 NOTE — Assessment & Plan Note (Signed)
Plan: Continue to follow-up with [facility]

## 2019-03-25 NOTE — Patient Instructions (Addendum)
You were seen today by Lauraine Rinne, NP  for:   1. COVID-19 virus detected  - DG Chest 2 View; Future  Walk today - did well   2. Stage 3 severe COPD by GOLD classification (HCC)  Trelegy Ellipta  >>> 1 puff daily in the morning >>>rinse mouth out after use  >>> This inhaler contains 3 medications that help manage her respiratory status, contact our office if you cannot afford this medication or unable to remain on this medication  Note your daily symptoms > remember "red flags" for COPD:   >>>Increase in cough >>>increase in sputum production >>>increase in shortness of breath or activity  intolerance.   If you notice these symptoms, please call the office to be seen.    3. Gastroesophageal reflux disease, unspecified whether esophagitis present  Follow up with GI   We recommend today:  Orders Placed This Encounter  Procedures  . DG Chest 2 View    Standing Status:   Future    Number of Occurrences:   1    Standing Expiration Date:   05/22/2020    Order Specific Question:   Reason for Exam (SYMPTOM  OR DIAGNOSIS REQUIRED)    Answer:   s/p covid, copd    Order Specific Question:   Preferred imaging location?    Answer:   Internal    Order Specific Question:   Radiology Contrast Protocol - do NOT remove file path    Answer:   [email or web address]   Orders Placed This Encounter  Procedures  . DG Chest 2 View   No orders of the defined types were placed in this encounter.   Follow Up:    Return in about 3 months (around 06/23/2019), or if symptoms worsen or fail to improve, for Follow up with Dr. Purnell Shoemaker, Follow up with [name] FNP-C.   Please do your part to reduce the spread of COVID-19:      Reduce your risk of any infection  and COVID19 by using the similar precautions used for avoiding the common cold or flu:  Marland Kitchen Wash your hands often with soap and warm water for at least 20 seconds.  If soap and water are not  readily available, use an alcohol-based hand sanitizer with at least 60% alcohol.  . If coughing or sneezing, cover your mouth and nose by coughing or sneezing into the elbow areas of your shirt or coat, into a tissue or into your sleeve (not your hands). Langley Gauss A MASK when in public  . Avoid shaking hands with others and consider head nods or verbal greetings only. . Avoid touching your eyes, nose, or mouth with unwashed hands.  . Avoid close contact with people who are sick. . Avoid places or events with large numbers of people in one location, like concerts or sporting events. . If you have some symptoms but not all symptoms, continue to monitor at home and seek medical attention if your symptoms worsen. . If you are having a medical emergency, call 911.   [place] / e-Visit: [email or web address]         [facility]: [facility]: [id number]                   [facility]: [id number]     It is flu season:   >>> Best ways to protect herself from the flu: Receive the yearly flu vaccine,  practice good hand hygiene washing with soap and also using hand sanitizer when available, eat a nutritious meals, get adequate rest, hydrate appropriately   Please contact the office if your symptoms worsen or you have concerns that you are not improving.   Thank you for choosing [facility] for your healthcare, and for allowing [place] to partner with you on your healthcare journey. I am thankful to be able to provide care to you today.   [name] FNP-C

## 2019-04-02 LAB — T4, FREE: Free T4: 1.3 ng/dL (ref 0.8–1.8)

## 2019-04-02 LAB — T3, FREE: T3, Free: 3.1 pg/mL (ref 2.3–4.2)

## 2019-04-02 LAB — TSH: TSH: 1.77 mIU/L (ref 0.40–4.50)

## 2019-04-11 ENCOUNTER — Ambulatory Visit (INDEPENDENT_AMBULATORY_CARE_PROVIDER_SITE_OTHER): Payer: Medicare Other | Admitting: "Endocrinology

## 2019-04-11 ENCOUNTER — Encounter (INDEPENDENT_AMBULATORY_CARE_PROVIDER_SITE_OTHER): Payer: Self-pay | Admitting: "Endocrinology

## 2019-04-11 ENCOUNTER — Other Ambulatory Visit: Payer: Self-pay

## 2019-04-11 VITALS — BP 138/70 | HR 100 | Ht 60.36 in | Wt 161.6 lb

## 2019-04-11 DIAGNOSIS — R7303 Prediabetes: Secondary | ICD-10-CM

## 2019-04-11 DIAGNOSIS — E063 Autoimmune thyroiditis: Secondary | ICD-10-CM | POA: Diagnosis not present

## 2019-04-11 DIAGNOSIS — J449 Chronic obstructive pulmonary disease, unspecified: Secondary | ICD-10-CM | POA: Diagnosis not present

## 2019-04-11 DIAGNOSIS — R1013 Epigastric pain: Secondary | ICD-10-CM

## 2019-04-11 DIAGNOSIS — E049 Nontoxic goiter, unspecified: Secondary | ICD-10-CM | POA: Diagnosis not present

## 2019-04-11 DIAGNOSIS — E559 Vitamin D deficiency, unspecified: Secondary | ICD-10-CM

## 2019-04-11 LAB — POCT GLUCOSE (DEVICE FOR HOME USE): POC Glucose: 139 mg/dl — AB (ref 70–99)

## 2019-04-11 LAB — POCT GLYCOSYLATED HEMOGLOBIN (HGB A1C): Hemoglobin A1C: 6.3 % — AB (ref 4.0–5.6)

## 2019-04-11 NOTE — Progress Notes (Signed)
Subjective:  Patient Name: Shelby Lin Date of Birth: 03-19-1940  MRN: [id number]  Shelby Lin  presents to the office today for follow-up of her hypothyroidism secondary to Hashimoto's disease, questionable nodular goiter, tachycardia, tremor, dyspepsia, fatigue, fibromyalgia, hyperlipidemia, night sweats, tremor, and pre-diabetes.  HISTORY OF PRESENT ILLNESS:   Shelby Lin is a 79 y.o. Caucasian woman.  Shelby Lin was unaccompanied.  1. Shelby Lin was referred to me on 03/25/2005 by her primary care provider, Dr. Carol Ada, for evaluation of her thyroid problems. Shelby Lin was 79 at that time.   A. At about age 75 the patient developed thyroid problems and was put on a thyroid medicine. Later when she became pregnant her obstetrician took her off that medicine. At about age 49, she developed severe fatigue and weight gain. She was put back on Synthroid. Her dose of Synthroid was 88 mcg per day for many years. In January 2006, she had fluctuations in her thyroid function tests. Synthroid was increased to 100 mcg and later to 125 mcg per day. The Synthroid dose was then reduced to 75 mcg. After blood tests about 9 months prior to her first appointment with me, the patient was put back on a dose of 88 mcg per day. When the patient was having fluctuations in her thyroid tests, her thyroid gland became visibly enlarged and felt full and tender to palpation. She was also more hoarse during those periods. Hoarseness came and went in parallel with the thyroid gland swelling. Ultrasound studies of the thyroid in January 2006 and January 2007 showed a multinodular goiter.   B. Thyroid function tests performed at that first visit showed a TSH of 4.139, free T4 1.22, and free T3 of 2.7. Because any TSH at that time greater than 3.0 was considered to be elevated physiologically, I increased her Synthroid from 88 to 100 mcg per day. TPO antibody was markedly positive at 954.0, c/w the diagnosis of Hashimoto's  thyroiditis. Her TSI level was quite normal at 0.9.   2. During the last 14 years the patient has had several medical issues that we have followed:  A. She has had several flare-ups of Hashimoto's disease, resulting in several changes in her doses of Synthroid. Her Synthroid dose was subsequently decreased to 88 mcg/day in January 2018.   B. Although she was initially thought to have a multinodular goiter, over time it became clear that all but one of the nodules were actually areas of echotexture heterogeneity due to Hashimoto's thyroiditis. She has had one "nodularish" area of her left inferior pole that has remained unchanged since 2011. This area is ill-defined and may also just represent several overlapping areas of heterogeneity.  C. She has also had problems with fibromyalgia, arthritis, hypertension, depression, fatigue, tremor, hyperlipidemia, reflux, dyspepsia, and vitamin D deficiency.   D. In August 2016 her HbA1c was 6.2%, c/w prediabetes. Since hen her HbA1c values have varied from 5.8% to 6.4%. The patient refused to take metformin because three of her relatives lost their scalp hair when they took metformin.    3. The patient's last [facility] visit was on 8/14/120. At that visit I reduced  her Synthroid to 88 mcg/day for 6 days each week, but only 1/2 tablet on the 7th days.and continued her MVI and Biotech.   A. In the interim she has been healthy, except for developing covid-19 in late December 2020 and early January 2021. Her husband contracted covid at the same time. She is slowly recovering.   B.  Her COPD is about the same. When she wears a mask it feels to her as if she is having more difficulty breathing, especially if she is more physically active. She is not having any allergy problems so far this Winter.      C. Her tension headaches are "terrible". Cervical stretching helps when she does her cervical stretching exercises regularly.   D. She still has a lot of fatigue, especially  after having had covid. Her energy has been low. She says that she sleeps well. Her husband has not been complaining about her snoring and she has not awakened gasping for breath.     E. She still has hot spells or sweats occasionally, but not very often.   F. Her depression is "okay". Her family problems are unchanged.      G. She continues to have problems with fibromyalgia and arthritis in her wrists, hands, knees, and feet, worse in the cold weather.   G. Her feet still bother her a lot. Her feet get red and feel hot in the forefoot area a lot, especially when the weather is warm. .    H. Her Meniere's disease has been active recently, in that she has had more mild vertigo. She does not have ringing and roaring in her ears very often. .   I. She remains on her Protonix daily. She still takes Synthroid, 88 mcg/day for 6 days each week, but only 1/2 tablet of Sundays. She also takes an MVI and Biotech vitamin D, 50,000 IU/weekly. Her cardiologist increased her rosuvastatin to 20 mg/day.   J. She is occasionally hoarse. The hoarseness still comes and goes in association with her allergies.   K. She rarely has sweet tea anymore.    4. Pertinent Review of Systems:  Constitutional: "I feel tired."  Her stamina and energy are worse. The SOB is worse when she walks very much.   Eyes: Vision is good as long as she wears her glasses. She had an eye exam in late 2020. She does have mild cataracts. There were no other significant eye problems noted.  Neck: The thyroid gland has not seemed to swell lately. The patient has no complaints of anterior neck soreness, tenderness,  pressure, discomfort, or difficulty swallowing.  Heart: She has not had any other chest symptoms. Heart rate increases with exercise or other physical activity. The patient has no other complaints of palpitations, irregular heat beats, chest pain, or chest pressure. Gastrointestinal: She has been having problems with constipation, so is  now taking Miralax or metamucil, but not regularly. Protonix is usually successful in controlling her acid reflux, indigestion, and dyspepsia.  Legs: Muscle mass and strength seem normal. Legs are stronger when she walks more. She no longer has episodic leg numbness and burning. She has had some edema occasionally, more in the left leg.  Feet: Her feet still burn in the forefeet at times.    GYN: She still gets hot flashes, but much less frequently and much less intensely.    Emotional/psychiatric: She feels "okay".    Mental: She does "well" at thinking, paying attention, remembering, and making decisions.    PAST MEDICAL, FAMILY, AND SOCIAL HISTORY:  Past Medical History:  Diagnosis Date  . Anxiety   . Arthritis   . Asthma in child   . Atherosclerotic heart disease of native coronary artery without angina pectoris   . Cataracts, bilateral   . Combined hyperlipidemia   . Constipation, unspecified   . COPD (chronic  obstructive pulmonary disease) ([place])   . Coronary artery spasm ([place])   . Diabetic neuropathy ([place])   . DM (diabetes mellitus) with complications ([place])   . Dupuytren's contracture   . Dyspepsia   . Familial tremor   . Fatigue   . Fibromyalgia syndrome   . GERD (gastroesophageal reflux disease)   . Hashimoto's thyroiditis   . Hiatal hernia   . History of colon polyps   . Hx of Hashimoto thyroiditis   . Hypercholesteremia   . Hypertension   . Hypothyroidism, acquired, autoimmune   . IBS (irritable bowel syndrome)   . Meniere's disease of left ear   . Migraine without aura and without status migrainosus, not intractable   . Multinodular goiter (nontoxic)   . Nonexudative macular degeneration   . Raynaud's syndrome without gangrene   . Situational stress   . Tachycardia   . Thyroiditis, autoimmune   . Ulcerative colitis, unspecified, without complications ([place])   . Vitamin B 12 deficiency   . Vitamin D deficiency   . Wears glasses     Family History  Problem  Relation Age of Onset  . Cancer Mother   . Hypertension Mother   . Stroke Mother   . Heart attack Father   . Sudden death Father   . Hypertension Sister   . Diabetes Sister   . Stroke Sister   . Cancer Maternal Uncle        breast,   . Cancer Paternal Aunt        breast, ovarian  . Thyroid disease Neg Hx      Current Outpatient Medications:  .  aspirin EC 81 MG tablet, Take 1 tablet (81 mg total) by mouth daily., Disp: , Rfl:  .  Cyanocobalamin (VITAMIN B 12 PO), Take by mouth daily., Disp: , Rfl:  .  DULoxetine (CYMBALTA) 30 MG capsule, TAKE 3 CAPSULES BY MOUTH ONCE DAILY ONCE A DAY ORALLY 30, Disp: , Rfl:  .  Fluticasone-Umeclidin-Vilant (TRELEGY ELLIPTA) 100-62.5-25 MCG/INH AEPB, Inhale 1 puff into the lungs daily., Disp: 2 each, Rfl: 0 .  furosemide (LASIX) 20 MG tablet, TAKE 1 TABLET BY MOUTH EVERY DAY AS NEEDED FOR SWELLING, Disp: , Rfl: 4 .  hydrochlorothiazide (HYDRODIURIL) 25 MG tablet, TAKE 1 TABLET BY MOUTH EVERY DAY IN THE MORNING, Disp: , Rfl: 1 .  LORazepam (ATIVAN) 0.5 MG tablet, TAKE 1/2 TAB EVERY 8 12 HOURS AS NEEDED FOR ANXIOUSNESS, Disp: , Rfl:  .  losartan (COZAAR) 100 MG tablet, Take 100 mg by mouth daily., Disp: , Rfl: 1 .  meclizine (ANTIVERT) 25 MG tablet, 1/2 to 1 tablet by mouth every 8 hrs as needed for diziness, Disp: , Rfl:  .  Multiple Vitamin (MULTIVITAMIN) tablet, Take 1 tablet by mouth daily.  , Disp: , Rfl:  .  omeprazole (PRILOSEC) 20 MG capsule, Take 20 mg by mouth daily., Disp: , Rfl:  .  Respiratory Therapy Supplies (FLUTTER) DEVI, Use device 2-3 times a day to break up congestion, Disp: 1 each, Rfl: 0 .  rosuvastatin (CRESTOR) 20 MG tablet, Take 1 tablet (20 mg total) by mouth daily., Disp: 90 tablet, Rfl: 3 .  SYNTHROID 88 MCG tablet, TAKE 1 TABLET (88 MCG TOTAL) BY MOUTH DAILY BEFORE BREAKFAST., Disp: 30 tablet, Rfl: 5 .  VITAMIN D, CHOLECALCIFEROL, PO, Take 50,000 Units by mouth once a week. , Disp: , Rfl:   Allergies as of 04/11/2019 -  Review Complete 03/25/2019  Allergen Reaction Noted  . Acyclovir and  related  04/01/2017  . Allegra [fexofenadine] Hives 04/28/2018  . Amoxicillin Hives 06/18/2010  . Codeine Nausea And Vomiting 02/21/2013  . Enalapril maleate Cough 04/28/2018  . Keflex [cephalexin] Hives 04/28/2018  . Levofloxacin Other (See Comments) 04/28/2018  . Lorabid [loracarbef] Hives 06/18/2010  . Sulfa antibiotics Hives 06/18/2010  . Sulfur Hives 09/08/2016    1. Work and Family: She retired in June 2013, but later went back to work part-time. She stopped part-time work in May 2020. She has not been as busy during the pandemic. Her husband retired at the same time.    2. Activities: She has not been walking much in the cold weather.   3. Smoking, alcohol, or drugs: None 4. Primary Care Provider: Dr. Carol Ada, [facility] [place]: There are no other significant problems involving Ciara's other body systems.   Objective:  Vital Signs:  BP 138/70   Pulse 100   Ht 5' 0.36" (1.533 m)   Wt 161 lb 9.6 oz (73.3 kg)   BMI 31.18 kg/m   Wt Readings from Last 3 Encounters:  04/11/19 161 lb 9.6 oz (73.3 kg)  03/25/19 161 lb (73 kg)  12/29/18 160 lb (72.6 kg)    Ht Readings from Last 3 Encounters:  04/11/19 5' 0.36" (1.533 m)  03/25/19 5' 4.5" (1.638 m)  12/29/18 5' 3.5" (1.613 m)    HC Readings from Last 3 Encounters:  No data found for [facility]   Facility age limit for growth percentiles is 20 years.  Body mass index is 31.18 kg/m. Facility age limit for growth percentiles is 20 years.  Body surface area is 1.77 meters squared.  PHYSICAL EXAMINATION  Constitutional: Shelby Lin looks tired today. She is alert and bright, but not as upbeat as she has been at some other visits . Her affect and insight are normal. She was short of breath for about a minute after walking into the exam room.  Her weight has increased 1-3/4 pounds since her last visit.  Eyes: There is no  obvious arcus or proptosis. Moisture appears normal. Mouth: The oropharynx and tongue appear normal. Oral moisture is normal. There is no oral hyperpigmentation. Neck: The neck appears to be visibly normal. No carotid bruits are noted. The thyroid gland is again within normal limits 18-20 grams in size. The thyroid gland is not tender to palpation. Left trapezius muscle: She has about a 4-5 mm nodular area in the skin superficial to the mid-portion of the left trapezius muscle. This area feels like a sebaceous cyst. I told her that if she is worried about this area she may need a referral to dermatology.  Lungs: The lungs are clear to auscultation. Air movement is fair.   Heart: Heart rate and rhythm are regular. Heart sounds S1 and S2 are normal. I did not appreciate any pathologic cardiac murmurs. Abdomen: The abdomen is enlarged. Bowel sounds are normal. There is no obvious hepatomegaly, splenomegaly, or other mass effect. The abdomen is not tender to palpation. Arms: Muscle size and bulk are normal for age.  Hands: There is a 2+ tremor. Phalangeal and metacarpophalangeal joints are normal. Palmar muscles are low-normal. Palmar moisture is also normal. There is no palmar erythema. Palms are warm. Legs: Muscles appear normal for age. No edema is present. Shins are sore to the touch.  Neurologic: Strength is normal for age in both the upper and lower extremities. Muscle tone is normal. Sensation to touch is normal in the legs.  LAB DATA:   Labs 04/11/19: HbA1c 6.3%, CBG 139  Labs 04/01/19: TSH 1.77, free T4  1.3, free T3 3.1  Labs 01/11/19:; HbA1c 6.2%; TSH 2.72, free T4 1.2, free T3 3.0; PTH 24, calcium 10.0, 25-OH vitamin D 58  Labs 10/05/18: HbA1c 6.3%; TSH 0.34, free T4 1.2, free T3 3.0; PTH 18, calcium 9.9, 25-OH vitamin D 43  Labs 04/09/18: HbA1c 6.4%, CBG 115  Labs 04/08/18: TSH 1.02, free T4 1.4, free T3 2.4; PTH 16, calcium 9.5, 25-OH vitamin D 45  Labs 10/07/17: HbA1c 6.2%, CBG  135  Labs 10/02/17: TSH 0.66, free T4 1.3, free T3.9; PTH 36, calcium 9.6, 25-OH vitamin D 53 2  Labs 04/20/17: HbA1c 6.0%, CBG 112  Labs 04/16/17: TSH 1.12, free 4 1.5, free T3 3.2; 25-OH vitamin D 55  Labs 10/13/16: HbA1c 6.1%, CBG 122  Labs 10/07/16: TSH 2.63, free T4 1.3, free T3 2.9; 25-OH vitamin D 47  Labs 06/05/16: HbA1c 5.8%, fasting glucose 120; TSH  1.40, free T4 1.1, free T3 2.7  Labs 02/22/16: HbA1c 6.4%, CBG 104, C-peptide 1.57 (ref 080-3.85); PTH 28, calcium 10, 25-OH vitamin D 45; TSH 0.16, free T4 1.3, free T3 2.8; CMP normal  Labs 11/11/15: TSH 0.05, free T4 1.5, free T3 3.6  Labs 08/14/15: HbA1c 5.8%  Labs 04/27/15: HbA1c 6.3%; TSH 1.04, free T4 1.3, free T3 2.8; PTH 45, calcium 9.4, 25-OH vitamin D 19  Labs 04/24/15: C-peptide 1.43 (normal 0.80-3.90)  Labs 12/04/14: HbA1c 6.1%  Labs 09/26/14 at 12:01 PM: CMP normal; HbA1c 6.2%;CBC normal, iron 81; ACTH 21, cortisol 12.9; vitamin B12 705 (normal 211-911)  09/19/14: TSH 1.640, free T4 1.03, free T3 2.6; Calcium 9.3, PTH 33 (increased from 26 in March), 25-OH vitamin D 21 (decreased from 23 in march)  04/28/14: TSH 0.081, free T4 1.47, free T3 3.3; calcium 9.4, PTH 26, 25-OH vitamin D 23  10/27/13: TSH 1.087, free T4 1.04, free T3 2.7; Calcium 9.6, PTH 32, 25-hydroxyvitamin D 34  05/18/13: TSH 0.052, free T4 1.24, free T3 3.2  04/28/13: TSH 0.426, free T4 1.24, free T3 3.2; calcium 9.7, 25-hydroxy vitamin D 36  10/08/12: TSH 2.106, free T4 1.05, free T3 2.6; 25-OH vitamin D 37, PTH 39.4, calcium 9.1  04/02/12: TSH 1.073, free T4 1.31, free T3 2.9, calcium 9.5, 25-hydroxy vitamin D 31 - PTH was ordered but not done. I have re-ordered it this morning.  09/30/11: TSH 8.095, free T4 1.16, free T3 2.4. PTH 24, calcium 9.3, 25-hydroxy vitamin D 33, 1,25-dihydroxy vitamin D 95 11/21/10: Calcium was 9.5. Her PTH was 37.6. 25-hydroxy vitamin D was 41. 1, 25-dihydroxy vitamin D was 85, which was slightly elevated. B12 was 352  (211-911)    IMAGING:   12/07/14 US thyroid: Both lobes are smaller, with maximum dimensions <2.5 cm. Both lobes are heterogenous in echotexture. The previously seen exophytic, solid, hypoechoic, ill-defined nodule in the left inferior pole remains at 0.9 x 0.4 x 0.5 cm and is unchanged from 08/23/2009.   10/12/12: US thyroid gland: Small 10 x 4 x 5 mm nodule or adjacent lymph node or parathyroid adenoma. Diffuse echogenicity. As I read the [place], there is definitely a lot of echogenicity c/w Hashimoto's thyroiditis. There may be a small nodule that is actually a bit less than one cm in longest dimension. This is unchanged since June 2011.   Assessment and Plan:   ASSESSMENT:  1. Hypothyroid:   A. She has acquired hypothyroidism due to Hashimoto's thyroiditis. During the  past 14 years her TFTs have been variable, requiring changes in her Synthroid dosage. Part of the variability was due to flare ups of Hashimoto's disease causing Hashitoxicosis.   B. Her TFTs in February 2019 were in the middle of the real physiologic normal range on her current Synthroid dose. Her TFTs in August 2019 were more variable. Her TSH was lower, her free T4 was lower, and her free T3 was higher. This pattern is most c/w having had a recent  flare up of thyroiditis. In contrast, her TFTS in February 2020 were mid-normal.   C. At her August 2020 visit her TSH was low, her free T4 was lower, and her free T3 was higher. It is likely that she has been having another flare up of thyroiditis. It was possible, however, that her tremor was due to relatively high thyroid hormone levels. It was prudent to reduce her Synthroid dose a bit and repeat her lab tests in two months.   D. Her current TFTS in February 2021 are in goal range, but she is still tremulous. The tremor is not caused by hyperthyroidism. .  2. Thyroiditis: The patient's Hashimoto's disease has usually been clinically quiescent, but was mildly Hashitoxic in the  January-March 2016 time period. Her thyroid US in October 2016 showed the heterogeneity c/w Hashimoto's thyroiditis. As noted above, she appeared to have had a recent flare up of thyroiditis prior to her August 2019 visit and again prior to her August 2020 visit. Her thyroiditis is clinically quiescent today.  3. Multinodular goiter:   A. The thyroid gland was again within normal limits for size at her February 2020 visit.   B. Her thyroid gland was borderline enlarged at her August 2020 visit, with the left lobe being a bit larger than the right. I dd not palpate any nodular area.   C. At today's visit her thyroid glans has shrunk back to normal size.   D. The waxing and waning of thyroid gland size is c/w evolving Hashimoto's disease.   E. Although some [place] studies have shown a nodule, her [place] study in June 2011 showed either a questionable nodule or demonstrated the echotexture heterogeneity that is commonly seen with Hashimoto's disease. Her [place] studies in August 2014 and October 2016 were essentially unchanged. She did not need a FNA at those times. 4. Headaches: Headaches appear to be "tension" headaches. They have significantly varied in frequency and severity. She does better when she performs her cervical stretching exercises daily. She may benefit from a referral to PT.  5. Hot flashes and sweats: These problems recur intermittently, but much less often..   6-7. Leg pains/vitamin D deficiency disease:   A. Her labs in July 2016 showed that her calcium was normal at 9.3, just below the 50%. Her PTH value was mid-range normal. Her vitamin D was lower. She needed more calcium and vitamin D. I had previously asked her to take Citracal-D at doses of 1200-1500 mg of calcium per day and [phone] IU of vitamin D per day.   B. Her labs in March 2017 showed that her PTH value of 45 was higher, but still mid-normal. Calcium was a bit higher at 9.4. Vitamin D was lower at 19. At that point she agreed to  purchase and take the Biotech form of vitamin D, one 50,000 IU capsule each week.   C. Her PTH, calcium and vitamin D were normal in December 2017, although the vitamin D had decreased.   D. She  is now taking Biotech vitamin D, 50,000 units each week.  Her vitamin D levels were normal again in August 2018, in August 2019, in February 2020, in August 2020, and in November 2020. Her PTH was lower in February 2020 and August 2020, and slightly higher in November 2020, but appropriate to her calcium level.  8. Meniere's disease: Her vertigo has recurred during the winter months, but fortunately has not been severe. Her Meniere's disease has bothered her intermittently in the past few months. Her hearing in the left ear varies.  9. Hoarseness: The fact that her hoarseness and post nasal drip come and go essentially in parallel indicate that the hoarseness is likely due to flare ups of her allergies. However, several flare ups of Hashimoto's disease have also caused hoarseness in the past. 10. Dyspepsia: This problem is reasonably well controlled with Protonix. 11. Hypertension: Her BP is normal today on her losartan.     13. Depression: Her affect and insight are very good today.   14. Fibromyalgia syndrome: This condition remains a problem for her, but seems to have worsened.  Exercise will help her "re-charge her mitochondrial batteries".  15. Fatigue:  This problem has also worsened, generally paralleling her COPD. 16. Prediabetes: Her HbA1c values in July and in October 2016 were elevated into the "pre-diabetes" range. Her HbA1c values have fluctuated over time depending upon her diet and her activity levels.  Her HbA1c today was the same today as it was in August 2020. She needs to be careful with her carb intake, especially if she is walking less. .   17. Arthritis: Her arthritis seems to be doing fairly well.   18. Overweight: Shelby Lin is a bit more overweight, most likely due to not walking as  much. 19. Tremor: Her tremor has varied in the past. If the tremor worsens she may benefit from a referral to neurology.      PLAN:  1. Diagnostic: Reviewed the HbA1c, CBG, TFTs, calcium, PTH, and vitamin D results from November and the HbA1c and TFT results from this month. I ordered TFTs, HbA1c, PTH, calcium, and vitamin D to be done prior to next visit.  2. Therapeutic: Try to do daily physical activity. Watch what she eats and drinks. Continue Synthroid to 88 mcg/day for 6 days each week, but take only 1/2 pill on the 7th days. Take MVI daily and Biotech, 50,000 IU per week. Take one Tums at bedtime.  3. Patient education: We again discussed the issues of prediabetes, headaches, Hashimoto's disease, hypothyroidism, fibromyalgia, fatigue, and COPD. We discussed what she needs to do to control her BGs, to include getting more exercise and following our Eat Right Diet. She knows what she needs to do to get her weight back under control. Unfortunately, her COPD and the cold weather make that increasingly more difficult.    4. Follow-up: 6 months  Level of Service: This visit lasted in excess of 55 minutes. More than 50% of the visit was devoted to counseling.   Tillman Sers, MD, CDE Adult and Pediatric Endocrinology

## 2019-04-11 NOTE — Patient Instructions (Signed)
Follow up visit in 6 months. Please repeat lab tests 1-2 weeks prior.  

## 2019-04-29 ENCOUNTER — Other Ambulatory Visit: Payer: Self-pay | Admitting: Pulmonary Disease

## 2019-06-06 ENCOUNTER — Other Ambulatory Visit: Payer: Self-pay | Admitting: Podiatry

## 2019-06-06 ENCOUNTER — Other Ambulatory Visit: Payer: Self-pay

## 2019-06-06 ENCOUNTER — Ambulatory Visit (INDEPENDENT_AMBULATORY_CARE_PROVIDER_SITE_OTHER): Payer: Medicare Other

## 2019-06-06 ENCOUNTER — Ambulatory Visit: Payer: Medicare Other | Admitting: Podiatry

## 2019-06-06 ENCOUNTER — Encounter: Payer: Self-pay | Admitting: Podiatry

## 2019-06-06 VITALS — BP 111/88 | HR 73 | Temp 97.4°F | Resp 16

## 2019-06-06 DIAGNOSIS — M79672 Pain in left foot: Secondary | ICD-10-CM

## 2019-06-06 DIAGNOSIS — G629 Polyneuropathy, unspecified: Secondary | ICD-10-CM | POA: Diagnosis not present

## 2019-06-06 DIAGNOSIS — R2681 Unsteadiness on feet: Secondary | ICD-10-CM | POA: Diagnosis not present

## 2019-06-06 DIAGNOSIS — M79671 Pain in right foot: Secondary | ICD-10-CM | POA: Diagnosis not present

## 2019-06-06 NOTE — Progress Notes (Signed)
   Subjective:    [name], female    DOB: 12-01-1940, 79 y.o.   MRN: [id number]  HPI    Review of Systems  All other systems reviewed and are negative.      Objective:   Physical Exam        Assessment & Plan:

## 2019-06-07 NOTE — Progress Notes (Signed)
Subjective:   [name], female   DOB: 79 y.o.   MRN: [id number]   HPI Patient presents stating that the feet get really red and burning in both tops and bottoms and knees patient is gone to neurologist has been diagnosed with fibromyalgia and was wondering if anything else could be done.  Currently taking Cymbalta and was taking gabapentin but that did not do well for her   ROS      Objective:  Physical Exam Vitals and nursing note reviewed.  Constitutional:      Appearance: She is well-developed.  Pulmonary:     Effort: Pulmonary effort is normal.  Musculoskeletal:        General: Normal range of motion.  Skin:    General: Skin is warm.  Neurological:     Mental Status: She is alert.     Neurovascular status found to be intact with mild diminishment of sharp dull vibratory bilateral.  Patient is found to have diminished range of motion of the subtalar midtarsal joint currently with no crepitus of the joint and is noted to have mild varicosities but good digital perfusion and well oriented x3     Assessment:  Patient with neuropathy which appears to be organic with no what appears to be physical causing     Plan:  H&P conditions reviewed and at this point I do not recommend any more aggressive treatment and unfortunately this is probably some she will have to live with a week organ to try physical therapy to try to improve her balance with the consideration for neurogenic treatment.  Reappoint as symptoms indicate but right now she is on the medicines that probably is best for her condition  X-rays are negative for signs of fracture or bone pathology

## 2019-06-21 ENCOUNTER — Other Ambulatory Visit (INDEPENDENT_AMBULATORY_CARE_PROVIDER_SITE_OTHER): Payer: Self-pay | Admitting: "Endocrinology

## 2019-06-21 DIAGNOSIS — E034 Atrophy of thyroid (acquired): Secondary | ICD-10-CM

## 2019-07-28 ENCOUNTER — Other Ambulatory Visit: Payer: Self-pay | Admitting: Gastroenterology

## 2019-07-28 ENCOUNTER — Ambulatory Visit
Admission: RE | Admit: 2019-07-28 | Discharge: 2019-07-28 | Disposition: A | Discharge status: Home / Self Care | Payer: Medicare Other | Source: Ambulatory Visit | Attending: Gastroenterology | Admitting: Gastroenterology

## 2019-07-28 DIAGNOSIS — K5900 Constipation, unspecified: Secondary | ICD-10-CM

## 2019-08-10 ENCOUNTER — Ambulatory Visit: Payer: Medicare Other | Admitting: Adult Health

## 2019-08-11 ENCOUNTER — Ambulatory Visit (INDEPENDENT_AMBULATORY_CARE_PROVIDER_SITE_OTHER): Payer: Medicare Other | Admitting: Adult Health

## 2019-08-11 ENCOUNTER — Other Ambulatory Visit: Payer: Self-pay

## 2019-08-11 DIAGNOSIS — J439 Emphysema, unspecified: Secondary | ICD-10-CM

## 2019-08-11 DIAGNOSIS — G4736 Sleep related hypoventilation in conditions classified elsewhere: Secondary | ICD-10-CM | POA: Diagnosis not present

## 2019-08-11 DIAGNOSIS — J441 Chronic obstructive pulmonary disease with (acute) exacerbation: Secondary | ICD-10-CM

## 2019-08-11 NOTE — Patient Instructions (Signed)
Continue on TRELEGY 1 puff daily , rinse after use.  Activity as tolerated.  Continue on Oxygen 2l/m At bedtime   Follow up with [name] in 6 months and As needed

## 2019-08-11 NOTE — Progress Notes (Signed)
Virtual Visit via Telephone Note  I connected with Shelby Lin on 08/11/19 at  2:30 PM EDT by telephone and verified that I am speaking with the correct person using two identifiers.  Location: Patient: Home  Provider: Home Office    I discussed the limitations, risks, security and privacy concerns of performing an evaluation and management service by telephone and the availability of in person appointments. I also discussed with the patient that there may be a patient responsible charge related to this service. The patient expressed understanding and agreed to proceed.   History of Present Illness: 79 yo female former smoker followed for Severe COPD -GOLD III and Chronic Respiratory Failure on nocturnal oxygen 2l/m .  Covid 19 infection 02/2019   Today's televisit is for  4 month follow up for COPD and Chronic Respiratory Failure on nocturnal oxygen. Says overall she is doing okay with no flare of cough or wheezing .  Had Covid 19 infection in January 2021 . Chest xray 03/25/19 was clear.  Said she recovered without much issues.  She is [place] go to beach soon and is wondering what to do with oxygen . We discussed contacting her DME company to ask for a loaner concentrator (portable continuous flow ) . Or See if DME will help set up DME at beach to provide concentrator at her beach location.  Denies chest pain, orthopnea, increased edema.  Remains on TRELEGY daily . Denies increased albuterol use.  Remains active , gets winded with heavy activity .   Observations/Objective:  Speaks in full sentences with no audible wheezing or distress   02/28/2018-chest x-ray- COPD changes without acute infiltrate, emphysematous and central bronchitic changes consistent with COPD  01/28/2018-CT chest without contrast-right middle lobe small solid pulmonary nodules are stable decreasing comparison at 02/11/2017 chest CT, considered benign, stable minimal scattered cylindrical bronchiectasis medial  upper lobes and medial right middle lobe  02/11/2017-CT chest high-res- minimal scattered cylindrical bronchiectasis with associated minimal tree-in-bud opacity in the medial upper lobes and medial right middle lobes, findings could be consistent of MAI >>>Mild to moderate centrilobular emphysema with mild diffuse bronchial wall thickening, 2 small solid pulmonary nodules in right lung largest with average diameter 5 mm in the right middle lobe  04/01/2017-pulmonary function test- FVC 1.69 (61% predicted), postbronchodilator ratio 48, postbronchodilator FEV1 46, positive bronchodilator response, mid flow reversibility after bronchodilator, DLCO 52 >>>Severe obstructive airways disease >>>Patient did take Incruse 1 puff at 8 AM prior to testing  03/30/2018-overnight oximetry- less than 88% for 43 minutes and 43 seconds, less than 89% for an hour and 36 minutes of 51 seconds, baseline SPO2 of 91%    Assessment and Plan: COPD -Severe -compensated on present regimen   Chronic Respiratory Failure on nocturnal Oxygen  Check DME company to see if loaner concentrator or local DME [place] beach for concentrator   Plan  Patient Instructions  Continue on TRELEGY 1 puff daily , rinse after use.  Activity as tolerated.  Continue on Oxygen 2l/m At bedtime   Follow up with [name] in 6 months and As needed       Follow Up Instructions: Follow up in 6 months and As needed     I discussed the assessment and treatment plan with the patient. The patient was provided an opportunity to ask questions and all were answered. The patient agreed with the plan and demonstrated an understanding of the instructions.   The patient was advised to call back or seek  an in-person evaluation if the symptoms worsen or if the condition fails to improve as anticipated.  I provided 22  minutes of non-face-to-face time during this encounter.   Rexene Edison, NP

## 2019-09-01 ENCOUNTER — Ambulatory Visit: Payer: Medicare Other | Admitting: Podiatry

## 2019-09-07 ENCOUNTER — Ambulatory Visit: Payer: Medicare Other | Admitting: Podiatry

## 2019-09-07 ENCOUNTER — Encounter: Payer: Self-pay | Admitting: Podiatry

## 2019-09-07 ENCOUNTER — Other Ambulatory Visit: Payer: Self-pay

## 2019-09-07 VITALS — Temp 97.2°F

## 2019-09-07 DIAGNOSIS — R2681 Unsteadiness on feet: Secondary | ICD-10-CM

## 2019-09-07 DIAGNOSIS — G629 Polyneuropathy, unspecified: Secondary | ICD-10-CM | POA: Diagnosis not present

## 2019-09-07 DIAGNOSIS — M79672 Pain in left foot: Secondary | ICD-10-CM | POA: Diagnosis not present

## 2019-09-07 DIAGNOSIS — M79671 Pain in right foot: Secondary | ICD-10-CM | POA: Diagnosis not present

## 2019-09-07 NOTE — Progress Notes (Signed)
Subjective:   [name], female   DOB: 79 y.o.   MRN: [id number]   HPI Pain should presents stating I have really done well with physical therapy and I seem to be feeling better walking better and it seems like I am improving   ROS      Objective:  Physical Exam  Neurovascular status unchanged from previous visit with patient appearing to be walking in a more stable fashion during gait and is not having the same type symptoms that she was having previously     Assessment:  Appears to be improving from neuropathic condition with utilization of probable neurogenic's along with different modalities for stretch      Plan:  H&P reviewed condition and recommended the continuation of conservative care.  We will utilize physical therapy on an as-needed basis she will do the exercises at home continue Cymbalta and use topical medicines and I educated her on today.  Patient had numerous questions which were answered she is discharged and will be seen back as needed if symptoms were to progress or not improve

## 2019-10-06 LAB — TSH: TSH: 1.26 mIU/L (ref 0.40–4.50)

## 2019-10-06 LAB — PTH, INTACT AND CALCIUM
Calcium: 10 mg/dL (ref 8.6–10.4)
PTH: 20 pg/mL (ref 14–64)

## 2019-10-06 LAB — T4, FREE: Free T4: 1.4 ng/dL (ref 0.8–1.8)

## 2019-10-06 LAB — T3, FREE: T3, Free: 3.3 pg/mL (ref 2.3–4.2)

## 2019-10-06 LAB — VITAMIN D 25 HYDROXY (VIT D DEFICIENCY, FRACTURES): Vit D, 25-Hydroxy: 46 ng/mL (ref 30–100)

## 2019-10-09 NOTE — Progress Notes (Signed)
Subjective:  Patient Name: Shelby Lin Date of Birth: [birth year]  MRN: [id number]  Shelby Lin  presents to the office today for follow-up of her hypothyroidism secondary to Hashimoto's disease, questionable nodular goiter, tachycardia, tremor, dyspepsia, fatigue, fibromyalgia, hyperlipidemia, night sweats, tremor, and pre-diabetes.  HISTORY OF PRESENT ILLNESS:   Shelby Lin is a 79 y.o. Caucasian woman.  Shelby Lin was unaccompanied.  1. Shelby Lin was referred to me on 03/25/2005 by her primary care provider, Dr. Carol Ada, for evaluation of her thyroid problems. Shelby Lin was 57 at that time.   A. At about age 11 the patient developed thyroid problems and was put on a thyroid medicine. Later when she became pregnant her obstetrician took her off that medicine. At about age 18, she developed severe fatigue and weight gain. She was put back on Synthroid. Her dose of Synthroid was 88 mcg per day for many years. In January 2006, she had fluctuations in her thyroid function tests. Synthroid was increased to 100 mcg and later to 125 mcg per day. The Synthroid dose was then reduced to 75 mcg. After blood tests about 9 months prior to her first appointment with me, the patient was put back on a dose of 88 mcg per day. When the patient was having fluctuations in her thyroid tests, her thyroid gland became visibly enlarged and felt full and tender to palpation. She was also more hoarse during those periods. Hoarseness came and went in parallel with the thyroid gland swelling. Ultrasound studies of the thyroid in January 2006 and January 2007 showed a multinodular goiter.   B. Thyroid function tests performed at that first visit showed a TSH of 4.139, free T4 1.22, and free T3 of 2.7. Because any TSH at that time greater than 3.0 was considered to be elevated physiologically, I increased her Synthroid from 88 to 100 mcg per day. TPO antibody was markedly positive at 954.0, c/w the diagnosis of Hashimoto's  thyroiditis. Her TSI level was quite normal at 0.9.   2. During the last 14 years the patient has had several medical issues that we have followed:  A. She has had several flare-ups of Hashimoto's disease, resulting in several changes in her doses of Synthroid. Her Synthroid dose was subsequently decreased to 88 mcg/day in January 2018.   B. Although she was initially thought to have a multinodular goiter, over time it became clear that all but one of the nodules were actually areas of echotexture heterogeneity due to Hashimoto's thyroiditis. She has had one "nodularish" area of her left inferior pole that has remained unchanged since 2011. This area is ill-defined and may also just represent several overlapping areas of heterogeneity.  C. She has also had problems with fibromyalgia, arthritis, hypertension, depression, fatigue, tremor, hyperlipidemia, reflux, dyspepsia, and vitamin D deficiency.   D. In August 2016 her HbA1c was 6.2%, c/w prediabetes. Since hen her HbA1c values have varied from 5.8% to 6.4%. The patient refused to take metformin because three of her relatives lost their scalp hair when they took metformin.    3. The patient's last [facility] visit was on 04/11/19. At that visit I continued her Synthroid dose of 88 mcg/day for 6 days each week, but only 1/2 tablet on the 7th days.and continued her MVI and Biotech.   A. In the interim she has been healthy, except for developing more constipation and falling twice.    1). She is under treatment with Linzess for the constipation.    2). She fell  twice walking up stairs. She appears to have some foot drop.  B. Her COPD is about the same. When she wears a mask it feels to her as if she is having more difficulty breathing, especially if she is more physically active, especially in the humid weather. She is not having many allergy problems now.      C. Her tension headaches are "terrible". Cervical stretching helps when she does her cervical  stretching exercises regularly.   D. She still has a lot of fatigue. Her energy has been low. She says that she sleeps well. Her husband has been complaining about her snoring, but she has not awakened gasping for breath.     E. She still has hot spells or sweats occasionally, but not very often.   F. Her depression is "okay". Her family problems are unchanged.      G. She continues to have problems with fibromyalgia and arthritis in her wrists, hands, knees, and feet, worse in the cold weather.   G. Her feet still bother her a lot. Her feet get red and feel hot in the forefoot area a lot, especially when the weather is warm. .    H. Her Meniere's disease has not been active recently, in that she has had more mild vertigo. She does not have ringing and roaring in her ears very often. .   I. She is hoarse more often. The hoarseness still comes and goes in association with her allergies.   J. She remains on her omeprazole daily. She still takes Synthroid, 88 mcg/day for 6 days each week, but only 1/2 tablet of Sundays. She also takes an MVI and Biotech vitamin D, 50,000 IU/weekly. Her cardiologist increased her rosuvastatin to 20 mg/day.   K. She rarely has sweet tea anymore.    4. Pertinent Review of Systems:  Constitutional: "I feel all right to be almost 80."  Her stamina and energy are worse. The SOB is worse when she walks very much.   Eyes: Vision is good as long as she wears her glasses. She had an eye exam in late 2020. She does have mild cataracts. There were no other significant eye problems noted.  Neck: The thyroid gland has not seemed to swell lately. The patient has no complaints of anterior neck soreness, tenderness,  pressure, discomfort, or difficulty swallowing.  Heart: She has not had any other chest symptoms. Heart rate increases with exercise or other physical activity. The patient has no other complaints of palpitations, irregular heat beats, chest pain, or chest  pressure. Gastrointestinal: She has been having more problems with constipation, so is now taking Linzess. Omeprazole is usually successful in controlling her acid reflux, indigestion, and dyspepsia.  Legs: Muscle mass and strength seem normal. Legs are stronger when she walks more. She no longer has episodic leg numbness and burning. She has had some edema occasionally, more in the left leg.  Feet: Her feet still burn in the forefeet at times.    GYN: She still gets hot flashes, but much less frequently and much less intensely.    Neuro: Her neuropathy in her feet is worse.  Emotional/psychiatric: She feels "okay".    Mental: She does "well" at thinking, paying attention, remembering, and making decisions. "I still have all my marbles."   PAST MEDICAL, FAMILY, AND SOCIAL HISTORY:  Past Medical History:  Diagnosis Date  . Anxiety   . Arthritis   . Asthma in child   . Atherosclerotic heart disease of  native coronary artery without angina pectoris   . Cataracts, bilateral   . Combined hyperlipidemia   . Constipation, unspecified   . COPD (chronic obstructive pulmonary disease) ([place])   . Coronary artery spasm ([place])   . Diabetic neuropathy ([place])   . DM (diabetes mellitus) with complications ([place])   . Dupuytren's contracture   . Dyspepsia   . Familial tremor   . Fatigue   . Fibromyalgia syndrome   . GERD (gastroesophageal reflux disease)   . Hashimoto's thyroiditis   . Hiatal hernia   . History of colon polyps   . Hx of Hashimoto thyroiditis   . Hypercholesteremia   . Hypertension   . Hypothyroidism, acquired, autoimmune   . IBS (irritable bowel syndrome)   . Meniere's disease of left ear   . Migraine without aura and without status migrainosus, not intractable   . Multinodular goiter (nontoxic)   . Nonexudative macular degeneration   . Raynaud's syndrome without gangrene   . Situational stress   . Tachycardia   . Thyroiditis, autoimmune   . Ulcerative colitis, unspecified,  without complications ([place])   . Vitamin B 12 deficiency   . Vitamin D deficiency   . Wears glasses     Family History  Problem Relation Age of Onset  . Cancer Mother   . Hypertension Mother   . Stroke Mother   . Heart attack Father   . Sudden death Father   . Hypertension Sister   . Diabetes Sister   . Stroke Sister   . Cancer Maternal Uncle        breast,   . Cancer Paternal Aunt        breast, ovarian  . Thyroid disease Neg Hx      Current Outpatient Medications:  .  aspirin EC 81 MG tablet, Take 1 tablet (81 mg total) by mouth daily., Disp: , Rfl:  .  Cyanocobalamin (VITAMIN B 12 PO), Take by mouth daily., Disp: , Rfl:  .  DULoxetine (CYMBALTA) 30 MG capsule, TAKE 3 CAPSULES BY MOUTH ONCE DAILY ONCE A DAY ORALLY 30, Disp: , Rfl:  .  hydrochlorothiazide (HYDRODIURIL) 25 MG tablet, TAKE 1 TABLET BY MOUTH EVERY DAY IN THE MORNING, Disp: , Rfl: 1 .  LINZESS 72 MCG capsule, Take 72 mcg by mouth daily. Takes 145 mcg, Disp: , Rfl:  .  losartan (COZAAR) 100 MG tablet, Take 100 mg by mouth daily., Disp: , Rfl: 1 .  Multiple Vitamin (MULTIVITAMIN) tablet, Take 1 tablet by mouth daily.  , Disp: , Rfl:  .  omeprazole (PRILOSEC) 20 MG capsule, Take 20 mg by mouth daily., Disp: , Rfl:  .  Respiratory Therapy Supplies (FLUTTER) DEVI, Use device 2-3 times a day to break up congestion, Disp: 1 each, Rfl: 0 .  rosuvastatin (CRESTOR) 20 MG tablet, Take 1 tablet (20 mg total) by mouth daily., Disp: 90 tablet, Rfl: 3 .  SYNTHROID 88 MCG tablet, TAKE 1 TABLET (88 MCG TOTAL) BY MOUTH DAILY BEFORE BREAKFAST., Disp: 30 tablet, Rfl: 5 .  TRELEGY ELLIPTA 100-62.5-25 MCG/INH AEPB, TAKE 1 PUFF BY MOUTH EVERY DAY, Disp: 60 each, Rfl: 4 .  VITAMIN D, CHOLECALCIFEROL, PO, Take 50,000 Units by mouth once a week. , Disp: , Rfl:  .  furosemide (LASIX) 20 MG tablet, TAKE 1 TABLET BY MOUTH EVERY DAY AS NEEDED FOR SWELLING (Patient not taking: Reported on 10/10/2019), Disp: , Rfl: 4 .  LORazepam (ATIVAN) 0.5 MG  tablet, TAKE 1/2 TAB EVERY 8 12  HOURS AS NEEDED FOR ANXIOUSNESS (Patient not taking: Reported on 10/10/2019), Disp: , Rfl:  .  meclizine (ANTIVERT) 25 MG tablet, 1/2 to 1 tablet by mouth every 8 hrs as needed for diziness (Patient not taking: Reported on 10/10/2019), Disp: , Rfl:   Allergies as of 10/10/2019 - Review Complete 10/10/2019  Allergen Reaction Noted  . Acyclovir and related  04/01/2017  . Allegra [fexofenadine] Hives 04/28/2018  . Amoxicillin Hives 06/18/2010  . Codeine Nausea And Vomiting 02/21/2013  . Enalapril maleate Cough 04/28/2018  . Keflex [cephalexin] Hives 04/28/2018  . Levofloxacin Other (See Comments) 04/28/2018  . Lorabid [loracarbef] Hives 06/18/2010  . Sulfa antibiotics Hives 06/18/2010  . Sulfur Hives 09/08/2016    1. Work and Family: She retired in June 2013, but later went back to work part-time. She stopped part-time work in May 2020. She has not been as busy during the pandemic. Her husband retired at the same time.    2. Activities: She has not been walking much.   3. Smoking, alcohol, or drugs: None 4. Primary Care Provider: Dr. Carol Ada, [facility] [place]: There are no other significant problems involving Shyana's other body systems.   Objective:  Vital Signs:  BP 112/74   Pulse 84   Ht 5' 3.5" (1.613 m)   Wt 159 lb 14.4 oz (72.5 kg)   BMI 27.88 kg/m   Wt Readings from Last 3 Encounters:  10/10/19 159 lb 14.4 oz (72.5 kg)  04/11/19 161 lb 9.6 oz (73.3 kg)  03/25/19 161 lb (73 kg)    Ht Readings from Last 3 Encounters:  10/10/19 5' 3.5" (1.613 m)  04/11/19 5' 0.36" (1.533 m)  03/25/19 5' 4.5" (1.638 m)    HC Readings from Last 3 Encounters:  No data found for [facility]   Facility age limit for growth percentiles is 20 years.  Body mass index is 27.88 kg/m. Facility age limit for growth percentiles is 20 years.  Body surface area is 1.8 meters squared.  PHYSICAL EXAMINATION  Constitutional: Zyla  looks good today. She is alert, bright, and upbeat. Her affect and insight are normal. Her weight has decreased 1-1/2 pounds since her last visit.  Eyes: There is no obvious arcus or proptosis. Moisture appears normal. Mouth: The oropharynx and tongue appear normal. Oral moisture is normal. There is no oral hyperpigmentation. Neck: The neck appears to be visibly normal. No carotid bruits are noted. The thyroid gland is again within normal limits 18-20 grams in size. The thyroid gland is not tender to palpation. Left trapezius muscle: She again has about a 4-5 mm nodular area in the skin superficial to the mid-portion of the left trapezius muscle. This area feels like a sebaceous cyst. I told her that if she is worried about this area she may need a referral to dermatology.  Lungs: The lungs are clear to auscultation. Air movement is fair.   Heart: Heart rate and rhythm are regular. Heart sounds S1 and S2 are normal. I did not appreciate any pathologic cardiac murmurs. Abdomen: The abdomen is enlarged. Bowel sounds are normal. There is no obvious hepatomegaly, splenomegaly, or other mass effect. The abdomen is not tender to palpation. Arms: Muscle size and bulk are normal for age.  Hands: There is a trace-to-1+ tremor. Phalangeal and metacarpophalangeal joints are normal. Palmar muscles are low-normal. Palmar moisture is normal. There is no palmar erythema. Palms are warm. Legs: Muscles appear normal for age. No edema is present.  Shins are sore to the touch.  Neurologic: Strength is normal for age in both the upper and lower extremities. Muscle tone is normal. Sensation to touch is normal in the legs and feet.   LAB DATA:   Labs 10/10/19; HbA1c 6.5%, CBG 132  Labs 10/05/19: TSH 1.26, free 4 1.4, free T3 3.3; PTH 20, calcium 10.0, 25-OH vitamin D 46  Labs 04/11/19: HbA1c 6.3%, CBG 139  Labs 04/01/19: TSH 1.77, free T4  1.3, free T3 3.1  Labs 01/11/19:; HbA1c 6.2%; TSH 2.72, free T4 1.2, free T3 3.0;  PTH 24, calcium 10.0, 25-OH vitamin D 58  Labs 10/05/18: HbA1c 6.3%; TSH 0.34, free T4 1.2, free T3 3.0; PTH 18, calcium 9.9, 25-OH vitamin D 43  Labs 04/09/18: HbA1c 6.4%, CBG 115  Labs 04/08/18: TSH 1.02, free T4 1.4, free T3 2.4; PTH 16, calcium 9.5, 25-OH vitamin D 45  Labs 10/07/17: HbA1c 6.2%, CBG 135  Labs 10/02/17: TSH 0.66, free T4 1.3, free T3.9; PTH 36, calcium 9.6, 25-OH vitamin D 53 2  Labs 04/20/17: HbA1c 6.0%, CBG 112  Labs 04/16/17: TSH 1.12, free 4 1.5, free T3 3.2; 25-OH vitamin D 55  Labs 10/13/16: HbA1c 6.1%, CBG 122  Labs 10/07/16: TSH 2.63, free T4 1.3, free T3 2.9; 25-OH vitamin D 47  Labs 06/05/16: HbA1c 5.8%, fasting glucose 120; TSH  1.40, free T4 1.1, free T3 2.7  Labs 02/22/16: HbA1c 6.4%, CBG 104, C-peptide 1.57 (ref 080-3.85); PTH 28, calcium 10, 25-OH vitamin D 45; TSH 0.16, free T4 1.3, free T3 2.8; CMP normal  Labs 11/11/15: TSH 0.05, free T4 1.5, free T3 3.6  Labs 08/14/15: HbA1c 5.8%  Labs 04/27/15: HbA1c 6.3%; TSH 1.04, free T4 1.3, free T3 2.8; PTH 45, calcium 9.4, 25-OH vitamin D 19  Labs 04/24/15: C-peptide 1.43 (normal 0.80-3.90)  Labs 12/04/14: HbA1c 6.1%  Labs 09/26/14 at 12:01 PM: CMP normal; HbA1c 6.2%;CBC normal, iron 81; ACTH 21, cortisol 12.9; vitamin B12 705 (normal 211-911)  09/19/14: TSH 1.640, free T4 1.03, free T3 2.6; Calcium 9.3, PTH 33 (increased from 26 in March), 25-OH vitamin D 21 (decreased from 23 in march)  04/28/14: TSH 0.081, free T4 1.47, free T3 3.3; calcium 9.4, PTH 26, 25-OH vitamin D 23  10/27/13: TSH 1.087, free T4 1.04, free T3 2.7; Calcium 9.6, PTH 32, 25-hydroxyvitamin D 34  05/18/13: TSH 0.052, free T4 1.24, free T3 3.2  04/28/13: TSH 0.426, free T4 1.24, free T3 3.2; calcium 9.7, 25-hydroxy vitamin D 36  10/08/12: TSH 2.106, free T4 1.05, free T3 2.6; 25-OH vitamin D 37, PTH 39.4, calcium 9.1  04/02/12: TSH 1.073, free T4 1.31, free T3 2.9, calcium 9.5, 25-hydroxy vitamin D 31 - PTH was ordered but not done. I have  re-ordered it this morning.  09/30/11: TSH 8.095, free T4 1.16, free T3 2.4. PTH 24, calcium 9.3, 25-hydroxy vitamin D 33, 1,25-dihydroxy vitamin D 95 11/21/10: Calcium was 9.5. Her PTH was 37.6. 25-hydroxy vitamin D was 41. 1, 25-dihydroxy vitamin D was 85, which was slightly elevated. B12 was 352 (211-911)    IMAGING:   12/07/14 US thyroid: Both lobes are smaller, with maximum dimensions <2.5 cm. Both lobes are heterogenous in echotexture. The previously seen exophytic, solid, hypoechoic, ill-defined nodule in the left inferior pole remains at 0.9 x 0.4 x 0.5 cm and is unchanged from 08/23/2009.   10/12/12: US thyroid gland: Small 10 x 4 x 5 mm nodule or adjacent lymph node or parathyroid adenoma. Diffuse echogenicity. As  I read the [place], there is definitely a lot of echogenicity c/w Hashimoto's thyroiditis. There may be a small nodule that is actually a bit less than one cm in longest dimension. This is unchanged since June 2011.   Assessment and Plan:   ASSESSMENT:  1. Hypothyroid:   A. She has acquired hypothyroidism due to Hashimoto's thyroiditis. During the past 14 years her TFTs have been variable, requiring changes in her Synthroid dosage. Part of the variability was due to flare ups of Hashimoto's disease causing Hashitoxicosis.   B. Her TFTs in February 2019 were in the middle of the real physiologic normal range on her current Synthroid dose. Her TFTs in August 2019 were more variable. Her TSH was lower, her free T4 was lower, and her free T3 was higher. This pattern is most c/w having had a recent  flare up of thyroiditis. In contrast, her TFTS in February 2020 were mid-normal.   C. At her August 2020 visit her TSH was low, her free T4 was lower, and her free T3 was higher. It is likely that she has been having another flare up of thyroiditis. It was possible, however, that her tremor was due to relatively high thyroid hormone levels. It was prudent to reduce her Synthroid dose a bit and  repeat her lab tests in two months.   D. Her current TFTs in August 2021 are in goal range, but she is still mildly tremulous. The tremor is not caused by hyperthyroidism. .  2. Thyroiditis: The patient's Hashimoto's disease has usually been clinically quiescent, but was mildly Hashitoxic in the January-March 2016 time period. Her thyroid US in October 2016 showed the heterogeneity c/w Hashimoto's thyroiditis. As noted above, she appeared to have had a recent flare up of thyroiditis prior to her August 2019 visit and again prior to her August 2020 visit. Her thyroiditis is clinically quiescent today.  3. Multinodular goiter:   A. The thyroid gland was again within normal limits for size at her February 2020 visit.   B. Her thyroid gland was borderline enlarged at her August 2020 visit, with the left lobe being a bit larger than the right. I dd not palpate any nodular area.   C. At today's visit her thyroid glans has shrunk back to normal size.   D. The waxing and waning of thyroid gland size is c/w evolving Hashimoto's disease.   E. Although some [place] studies have shown a nodule, her [place] study in June 2011 showed either a questionable nodule or demonstrated the echotexture heterogeneity that is commonly seen with Hashimoto's disease. Her [place] studies in August 2014 and October 2016 were essentially unchanged. She did not need a FNA at those times. 4. Headaches: Headaches appear to be "tension" headaches. They have significantly varied in frequency and severity. She does better when she performs her cervical stretching exercises daily. She may benefit from a referral to PT.  5. Hot flashes and sweats: These problems recur intermittently, but much less often..   6-7. Leg pains/vitamin D deficiency disease:   A. Her labs in July 2016 showed that her calcium was normal at 9.3, just below the 50%. Her PTH value was mid-range normal. Her vitamin D was lower. She needed more calcium and vitamin D. I had previously  asked her to take Citracal-D at doses of 1200-1500 mg of calcium per day and [phone] IU of vitamin D per day.   B. Her labs in March 2017 showed that her PTH value of 45  was higher, but still mid-normal. Calcium was a bit higher at 9.4. Vitamin D was lower at 19. At that point she agreed to purchase and take the Biotech form of vitamin D, one 50,000 IU capsule each week.   C. Her PTH, calcium and vitamin D were normal in December 2017, although the vitamin D had decreased.   D. She is now taking Biotech vitamin D, 50,000 units each week.  Her vitamin D levels were normal again in August 2018, in August 2019, in February 2020, in August 2020, in November 2020, and in August 2021. Her PTH was lower in February 2020 and August 2020, slightly higher in November 2020, lower in August 2021, but appropriate to her calcium level.  8. Meniere's disease: Her vertigo has been quiescent. Her Meniere's disease has bothered her intermittently in the past few months. Her hearing in the left ear varies.  9. Hoarseness: In the past, the fact that her hoarseness and post nasal drip came and went essentially in parallel indicate that the hoarseness was likely due to flare ups of her allergies. In addition, several flare ups of Hashimoto's disease have also caused hoarseness in the past. More recently, however, her hoarseness has been worse, without any allergy symptoms or thyroiditis symptome. A consult to ENT is indicated.  10. Dyspepsia: This problem is reasonably well controlled with Protonix. 11. Hypertension: Her BP is normal today on her losartan.     13. Depression: Her affect and insight are very good today.   14. Fibromyalgia syndrome: This condition remains a problem for her, but seems to have worsened.  Exercise will help her "re-charge her mitochondrial batteries".  15. Fatigue:  This problem has also worsened, generally paralleling her COPD. 16. Prediabetes: Her HbA1c values in July and in October 2016 were  elevated into the "pre-diabetes" range. Her HbA1c values have fluctuated over time depending upon her diet and her activity levels.  Her HbA1c today was at the bottom end of the diabetes range. I asked her to tighten down on her carb intake.I instructed her on the Eat Right Diet and the [place] recipes. I will see her again in 3 months   17. Arthritis: Her arthritis seems to be doing fairly well.   18. Overweight: Shelby Lin is a bit more overweight, most likely due to not walking as much. 19. Tremor: Her tremor has varied in the past. If the tremor worsens she may benefit from a referral to neurology.      PLAN:  1. Diagnostic: Reviewed the HbA1c, CBG, TFTs, calcium, PTH, and vitamin D results from this month. Refer to ENT.  2. Therapeutic: Try to do daily physical activity. Eat Right Diet and [facility] Diet recipes. Watch what she eats and drinks. Continue Synthroid to 88 mcg/day for 6 days each week, but take only 1/2 pill on the 7th days. Take MVI daily and Biotech, 50,000 IU per week. Take one Tums at bedtime.  3. Patient education: We again discussed the issues of prediabetes, headaches, Hashimoto's disease, hypothyroidism, fibromyalgia, fatigue, and COPD. We discussed what she needs to do to control her BGs, to include getting more exercise and following our Eat Right Diet. She knows what she needs to do to get her weight back under control. Unfortunately, her COPD and the cold weather make that increasingly more difficult.    4. Follow-up: 3 months  Level of Service: This visit lasted in excess of 55 minutes. More than 50% of the visit  was devoted to counseling.   Tillman Sers, MD, CDE Adult and Pediatric Endocrinology

## 2019-10-10 ENCOUNTER — Ambulatory Visit (INDEPENDENT_AMBULATORY_CARE_PROVIDER_SITE_OTHER): Payer: Medicare Other | Admitting: "Endocrinology

## 2019-10-10 ENCOUNTER — Other Ambulatory Visit (INDEPENDENT_AMBULATORY_CARE_PROVIDER_SITE_OTHER): Payer: Self-pay | Admitting: *Deleted

## 2019-10-10 ENCOUNTER — Encounter (INDEPENDENT_AMBULATORY_CARE_PROVIDER_SITE_OTHER): Payer: Self-pay | Admitting: "Endocrinology

## 2019-10-10 ENCOUNTER — Other Ambulatory Visit: Payer: Self-pay

## 2019-10-10 VITALS — BP 112/74 | HR 84 | Ht 63.5 in | Wt 159.9 lb

## 2019-10-10 DIAGNOSIS — E063 Autoimmune thyroiditis: Secondary | ICD-10-CM | POA: Diagnosis not present

## 2019-10-10 DIAGNOSIS — M797 Fibromyalgia: Secondary | ICD-10-CM

## 2019-10-10 DIAGNOSIS — E042 Nontoxic multinodular goiter: Secondary | ICD-10-CM

## 2019-10-10 DIAGNOSIS — I10 Essential (primary) hypertension: Secondary | ICD-10-CM | POA: Diagnosis not present

## 2019-10-10 DIAGNOSIS — E663 Overweight: Secondary | ICD-10-CM

## 2019-10-10 DIAGNOSIS — R7303 Prediabetes: Secondary | ICD-10-CM

## 2019-10-10 DIAGNOSIS — R1013 Epigastric pain: Secondary | ICD-10-CM

## 2019-10-10 DIAGNOSIS — R490 Dysphonia: Secondary | ICD-10-CM

## 2019-10-10 DIAGNOSIS — E559 Vitamin D deficiency, unspecified: Secondary | ICD-10-CM

## 2019-10-10 DIAGNOSIS — G250 Essential tremor: Secondary | ICD-10-CM

## 2019-10-10 LAB — POCT GLYCOSYLATED HEMOGLOBIN (HGB A1C): Hemoglobin A1C: 6.5 % — AB (ref 4.0–5.6)

## 2019-10-10 LAB — POCT GLUCOSE (DEVICE FOR HOME USE): Glucose Fasting, POC: 132 mg/dL — AB (ref 70–99)

## 2019-10-10 NOTE — Patient Instructions (Signed)
Follow up visit in 3 months. 

## 2019-10-11 ENCOUNTER — Ambulatory Visit: Payer: Medicare Other | Admitting: Pulmonary Disease

## 2019-10-18 ENCOUNTER — Encounter: Payer: Self-pay | Admitting: Pulmonary Disease

## 2019-10-18 ENCOUNTER — Ambulatory Visit (INDEPENDENT_AMBULATORY_CARE_PROVIDER_SITE_OTHER): Payer: Medicare Other | Admitting: Pulmonary Disease

## 2019-10-18 ENCOUNTER — Other Ambulatory Visit: Payer: Self-pay

## 2019-10-18 VITALS — BP 110/62 | HR 99 | Temp 98.1°F | Ht 64.0 in | Wt 158.6 lb

## 2019-10-18 DIAGNOSIS — J449 Chronic obstructive pulmonary disease, unspecified: Secondary | ICD-10-CM

## 2019-10-18 DIAGNOSIS — J479 Bronchiectasis, uncomplicated: Secondary | ICD-10-CM | POA: Diagnosis not present

## 2019-10-18 DIAGNOSIS — G4736 Sleep related hypoventilation in conditions classified elsewhere: Secondary | ICD-10-CM

## 2019-10-18 DIAGNOSIS — J439 Emphysema, unspecified: Secondary | ICD-10-CM

## 2019-10-18 MED ORDER — TRELEGY ELLIPTA 100-62.5-25 MCG/INH IN AEPB: INHALATION_SPRAY | RESPIRATORY_TRACT | 11 refills | Status: DC

## 2019-10-18 MED ORDER — ALBUTEROL SULFATE HFA 108 (90 BASE) MCG/ACT IN AERS: 1.0000 | INHALATION_SPRAY | Freq: Four times a day (QID) | RESPIRATORY_TRACT | 6 refills | Status: DC | PRN

## 2019-10-18 NOTE — Patient Instructions (Addendum)
You were seen today by Lauraine Rinne, NP  for:   1. Stage 3 severe COPD by GOLD classification (HCC)  - albuterol (VENTOLIN HFA) 108 (90 Base) MCG/ACT inhaler; Inhale 1-2 puffs into the lungs every 6 (six) hours as needed for wheezing or shortness of breath.  Dispense: 18 g; Refill: 6  Trelegy Ellipta 100 >>> 1 puff daily in the morning >>>rinse mouth out after use  >>> This inhaler contains 3 medications that help manage her respiratory status, contact our office if you cannot afford this medication or unable to remain on this medication  Only use your albuterol as a rescue medication to be used if you can't catch your breath by resting or doing a relaxed purse lip breathing pattern.  - The less you use it, the better it will work when you need it. - Ok to use up to 2 puffs  every 4 hours if you must but call for immediate appointment if use goes up over your usual need - Don't leave home without it !!  (think of it like the spare tire for your car)   Note your daily symptoms > remember "red flags" for COPD:   >>>Increase in cough >>>increase in sputum production >>>increase in shortness of breath or activity  intolerance.   If you notice these symptoms, please call the office to be seen.   We recommend that you receive the flu vaccine in the fall  2. Nocturnal hypoxemia due to emphysema (HCC)  Continue oxygen therapy as prescribed  >>>maintain oxygen saturations greater than 88 percent  >>>if unable to maintain oxygen saturations please contact the office  >>>do not smoke with oxygen  >>>can use nasal saline gel or nasal saline rinses to moisturize nose if oxygen causes dryness   3. Bronchiectasis without complication (HCC)  Bronchiectasis: This is the medical term which indicates that you have damage, dilated airways making you more susceptible to respiratory infection. Use a flutter valve 10 breaths twice a day or 4 to 5 breaths 4-5 times a day to help clear mucus out Let us  know if you have cough with change in mucus color or fevers or chills.  At that point you would need an antibiotic. Maintain a healthy nutritious diet, eating whole foods Take your medications as prescribed     We recommend today:   Meds ordered this encounter  Medications  . albuterol (VENTOLIN HFA) 108 (90 Base) MCG/ACT inhaler    Sig: Inhale 1-2 puffs into the lungs every 6 (six) hours as needed for wheezing or shortness of breath.    Dispense:  18 g    Refill:  6    Follow Up:    Return in about 6 months (around 04/19/2020), or if symptoms worsen or fail to improve, for Follow up with [name] FNP-C, Follow up with Dr. Purnell Shoemaker.   Please do your part to reduce the spread of COVID-19:      Reduce your risk of any infection  and COVID19 by using the similar precautions used for avoiding the common cold or flu:  Marland Kitchen Wash your hands often with soap and warm water for at least 20 seconds.  If soap and water are not readily available, use an alcohol-based hand sanitizer with at least 60% alcohol.  . If coughing or sneezing, cover your mouth and nose by coughing or sneezing into the elbow areas of your shirt or coat, into a tissue or into your sleeve (not your hands). Langley Gauss  A MASK when in public  . Avoid shaking hands with others and consider head nods or verbal greetings only. . Avoid touching your eyes, nose, or mouth with unwashed hands.  . Avoid close contact with people who are sick. . Avoid places or events with large numbers of people in one location, like concerts or sporting events. . If you have some symptoms but not all symptoms, continue to monitor at home and seek medical attention if your symptoms worsen. . If you are having a medical emergency, call 911.   [place] / e-Visit: [email or web address]         [facility]: [facility]:  [phone]                   [facility]: [phone]     It is flu season:   >>> Best ways to protect herself from the flu: Receive the yearly flu vaccine, practice good hand hygiene washing with soap and also using hand sanitizer when available, eat a nutritious meals, get adequate rest, hydrate appropriately   Please contact the office if your symptoms worsen or you have concerns that you are not improving.   Thank you for choosing  [facility] for your healthcare, and for allowing [place] to partner with you on your healthcare journey. I am thankful to be able to provide care to you today.   [name] FNP-C   COPD and Physical Activity Chronic obstructive pulmonary disease (COPD) is a long-term (chronic) condition that affects the lungs. COPD is a general term that can be used to describe many different lung problems that cause lung swelling (inflammation) and limit airflow, including chronic bronchitis and emphysema. The main symptom of COPD is shortness of breath, which makes it harder to do even simple tasks. This can also make it harder to exercise and be active. Talk with your health care provider about treatments to help you breathe better and actions you can take to prevent breathing problems during physical activity. What are the benefits of exercising with COPD? Exercising regularly is an important part of a healthy lifestyle. You can still exercise and do physical activities even though you have COPD. Exercise and physical activity improve your shortness of breath by increasing blood flow (circulation). This causes your heart to pump more oxygen through your body. Moderate exercise can improve your:  Oxygen use.  Energy level.  Shortness of breath.  Strength in your breathing muscles.  Heart health.  Sleep.  Self-esteem and feelings of self-worth.  Depression, stress, and anxiety levels. Exercise can benefit everyone with COPD. The  severity of your disease may affect how hard you can exercise, especially at first, but everyone can benefit. Talk with your health care provider about how much exercise is safe for you, and which activities and exercises are safe for you. What actions can I take to prevent breathing problems during physical activity?  Sign up for a pulmonary rehabilitation program. This type of program may include: ? Education about lung diseases. ? Exercise classes that teach you how to exercise and be more active while improving your breathing. This usually involves:  Exercise using your lower extremities, such as a stationary bicycle.  About 30 minutes of exercise, 2 to 5 times per week, for 6 to 12 weeks  Strength training, such as push ups or leg lifts. ? Nutrition education. ? Group classes in which you can  talk with others who also have COPD and learn ways to manage stress.  If you use an oxygen tank, you should use it while you exercise. Work with your health care provider to adjust your oxygen for your physical activity. Your resting flow rate is different from your flow rate during physical activity.  While you are exercising: ? Take slow breaths. ? Pace yourself and do not try to go too fast. ? Purse your lips while breathing out. Pursing your lips is similar to a kissing or whistling position. ? If doing exercise that uses a quick burst of effort, such as weight lifting:  Breathe in before starting the exercise.  Breathe out during the hardest part of the exercise (such as raising the weights). Where to find support You can find support for exercising with COPD from:  Your health care provider.  A pulmonary rehabilitation program.  Your local health department or community health programs.  Support groups, online or in-person. Your health care provider may be able to recommend support groups. Where to find more information You can find more information about exercising with COPD  from:  American Lung Association: ClassInsider.se.  COPD Foundation: https://www.rivera.net/. Contact a health care provider if:  Your symptoms get worse.  You have chest pain.  You have nausea.  You have a fever.  You have trouble talking or catching your breath.  You want to start a new exercise program or a new activity. Summary  COPD is a general term that can be used to describe many different lung problems that cause lung swelling (inflammation) and limit airflow. This includes chronic bronchitis and emphysema.  Exercise and physical activity improve your shortness of breath by increasing blood flow (circulation). This causes your heart to provide more oxygen to your body.  Contact your health care provider before starting any exercise program or new activity. Ask your health care provider what exercises and activities are safe for you. This information is not intended to replace advice given to you by your health care provider. Make sure you discuss any questions you have with your health care provider. Document Revised: 06/02/2018 Document Reviewed: 03/05/2017 Elsevier Patient Education  2020 [employer].

## 2019-10-18 NOTE — Assessment & Plan Note (Signed)
Plan: Continue oxygen therapy at night 

## 2019-10-18 NOTE — Assessment & Plan Note (Signed)
Plan: Continue flutter valve use

## 2019-10-18 NOTE — Progress Notes (Signed)
@Patient  ID: Shelby Lin, female    DOB: Aug 19, 1940, 79 y.o.   MRN: [id number]  Chief Complaint  Patient presents with  . Follow-up    copd    Referring provider: Carol Ada, MD  HPI:  79 year old female former smoker followed in our office for gold 3 COPD  PMH: Hypothyroidism due to Hashimoto's thyroiditis (managed by endocrinology [name]) Smoker/ Smoking History: Former smoker.  Quit 2002.  21-pack-year smoking history. Maintenance: Trelegy Ellipta 100 Pt of: [name]  10/18/2019  - Visit   79 year old female former smoker followed in our office for COPD.  She is followed by [name].  Patient was last seen in our office virtually in June/2021.  Is recommended that she remain on Trelegy Ellipta at that time and continue oxygen 2 L at bedtime.  And follow-up in 6 months and as needed.  She is status post COVID-19 infection in January/2021.  At point time she is planning on traveling to the beach.  It was recommended that she contact her DME company to ask for loaner concentrator.  That was a stable virtual visit.  Patient is doing well since last televisit.  She went to the beach without any pulmonary complications.  She is using her Trelegy Ellipta inhaler daily.  She needs refills of this today.  She also has a rescue inhaler which is currently expired.  She is using this maybe 1 time a month.  We will refill this today.  Patient is struggling with increasing overall physical activity due to fibromyalgia and neuropathy.  She is working with primary care for this.  We will discuss this today.   Questionaires / Pulmonary Flowsheets:   ACT:  No flowsheet data found.  MMRC: mMRC Dyspnea Scale mMRC Score  10/18/2019 1  03/25/2019 1  10/20/2018 3    [place]:  No flowsheet data found.  Tests:   03/01/2019-SARS-CoV-2-positive  02/28/2018-chest x-ray- COPD changes without acute infiltrate, emphysematous and central bronchitic changes consistent with  COPD  01/28/2018-CT chest without contrast-right middle lobe small solid pulmonary nodules are stable decreasing comparison at 02/11/2017 chest CT, considered benign, stable minimal scattered cylindrical bronchiectasis medial upper lobes and medial right middle lobe  02/11/2017-CT chest high-res- minimal scattered cylindrical bronchiectasis with associated minimal tree-in-bud opacity in the medial upper lobes and medial right middle lobes, findings could be consistent of MAI >>>Mild to moderate centrilobular emphysema with mild diffuse bronchial wall thickening, 2 small solid pulmonary nodules in right lung largest with average diameter 5 mm in the right middle lobe  04/01/2017-pulmonary function test- FVC 1.69 (61% predicted), postbronchodilator ratio 48, postbronchodilator FEV1 46, positive bronchodilator response, mid flow reversibility after bronchodilator, DLCO 52 >>>Severe obstructive airways disease >>>Patient did take Incruse 1 puff at 8 AM prior to testing  03/30/2018-overnight oximetry- less than 88% for 43 minutes and 43 seconds, less than 89% for an hour and 36 minutes of 51 seconds, baseline SPO2 of 91%  FENO:  No results found for: NITRICOXIDE  PFT: PFT Results Latest Ref Rng & Units 04/01/2017  FVC-Pre L 1.69  FVC-Predicted Pre % 61  FVC-Post L 2.02  FVC-Predicted Post % 73  Pre FEV1/FVC % % 45  Post FEV1/FCV % % 48  FEV1-Pre L 0.76  FEV1-Predicted Pre % 36  FEV1-Post L 0.96  DLCO uncorrected ml/min/mmHg 12.81  DLCO UNC% % 52  DLVA Predicted % 84    WALK:  SIX MIN WALK 03/25/2019 03/15/2018 02/11/2017  Supplimental Oxygen during Test? (L/min) No No  No  Tech Comments: moderate paced walk, SOB and wheezing at third lap patient walked at a moderate pace, patient walked all three laps no SOB, dizziness or lightheadedness ambulated at steady pace, no SOB    Imaging: No results found.  Lab Results:  CBC    Component Value Date/Time   WBC 14.0 (H) 02/28/2018 1732   RBC  3.93 02/28/2018 1732   HGB 11.6 (L) 02/28/2018 1732   HCT 37.0 02/28/2018 1732   PLT 382 02/28/2018 1732   MCV 94.1 02/28/2018 1732   MCH 29.5 02/28/2018 1732   MCHC 31.4 02/28/2018 1732   RDW 12.3 02/28/2018 1732   LYMPHSABS 2.9 02/28/2018 1732   MONOABS 1.2 (H) 02/28/2018 1732   EOSABS 0.3 02/28/2018 1732   BASOSABS 0.1 02/28/2018 1732    BMET    Component Value Date/Time   NA 136 02/28/2018 1732   K 3.5 02/28/2018 1732   CL 98 02/28/2018 1732   CO2 29 02/28/2018 1732   GLUCOSE 133 (H) 02/28/2018 1732   BUN 12 02/28/2018 1732   CREATININE 0.63 02/28/2018 1732   CREATININE 0.59 (L) 02/22/2016 0001   CALCIUM 10.0 10/05/2019 0903   CALCIUM 9.7 04/06/2012 1019   GFRNONAA >60 02/28/2018 1732   GFRAA >60 02/28/2018 1732    BNP    Component Value Date/Time   BNP 25.5 02/28/2018 1732    ProBNP No results found for: PROBNP  Specialty Problems      Pulmonary Problems   Stage 3 severe COPD by GOLD classification ([place])    02/11/2017-CT chest high-res- minimal scattered cylindrical bronchiectasis with associated minimal tree-in-bud opacity in the medial upper lobes and medial right middle lobes, findings could be consistent of MAI >>>Mild to moderate centrilobular emphysema with mild diffuse bronchial wall thickening, 2 small solid pulmonary nodules in right lung largest with average diameter 5 mm in the right middle lobe  04/01/2017-pulmonary function test- FVC 1.69 (61% predicted), postbronchodilator ratio 48, postbronchodilator FEV1 46, positive bronchodilator response, mid flow reversibility after bronchodilator, DLCO 52 >>>Severe obstructive airways disease >>>Patient did take Incruse 1 puff at 8 AM prior to testing  02/28/2018-chest x-ray- COPD changes without acute infiltrate, emphysematous and central bronchitic changes consistent with COPD        Bronchiectasis with acute exacerbation (HCC)    02/11/2017-CT chest high-res- minimal scattered cylindrical  bronchiectasis with associated minimal tree-in-bud opacity in the medial upper lobes and medial right middle lobes, findings could be consistent of MAI >>>Mild to moderate centrilobular emphysema with mild diffuse bronchial wall thickening, 2 small solid pulmonary nodules in right lung largest with average diameter 5 mm in the right middle lobe  01/28/2018-CT chest without contrast-right middle lobe small solid pulmonary nodules are stable decreasing comparison at 02/11/2017 chest CT, considered benign, stable minimal scattered cylindrical bronchiectasis medial upper lobes and medial right middle lobe      Bronchiectasis without complication (HCC)    56/31/79-YO chest high-res- minimal scattered cylindrical bronchiectasis with associated minimal tree-in-bud opacity in the medial upper lobes and medial right middle lobes, findings could be consistent of MAI >>>Mild to moderate centrilobular emphysema with mild diffuse bronchial wall thickening, 2 small solid pulmonary nodules in right lung largest with average diameter 5 mm in the right middle lobe      Nocturnal hypoxemia due to emphysema (HCC)    03/30/2018-overnight oximetry- less than 88% for 43 minutes and 43 seconds, less than 89% for an hour and 36 minutes of 51 seconds, baseline SPO2 of 91%  Allergies  Allergen Reactions  . Acyclovir And Related   . Allegra [Fexofenadine] Hives  . Amoxicillin Hives  . Codeine Nausea And Vomiting  . Enalapril Maleate Cough  . Keflex [Cephalexin] Hives  . Levofloxacin Other (See Comments)    insomnia  . Lorabid [Loracarbef] Hives  . Sulfa Antibiotics Hives  . Sulfur Hives    Immunization History  Administered Date(s) Administered  . Influenza, High Dose Seasonal PF 12/12/2016, 11/27/2017, 12/14/2018  . PFIZER SARS-COV-2 Vaccination 05/05/2019, 05/26/2019  . Pneumococcal Conjugate-13 10/16/2017  . Pneumococcal Polysaccharide-23 10/20/2018    Past Medical History:  Diagnosis Date   . Anxiety   . Arthritis   . Asthma in child   . Atherosclerotic heart disease of native coronary artery without angina pectoris   . Cataracts, bilateral   . Combined hyperlipidemia   . Constipation, unspecified   . COPD (chronic obstructive pulmonary disease) ([place])   . Coronary artery spasm ([place])   . Diabetic neuropathy ([place])   . DM (diabetes mellitus) with complications ([place])   . Dupuytren's contracture   . Dyspepsia   . Familial tremor   . Fatigue   . Fibromyalgia syndrome   . GERD (gastroesophageal reflux disease)   . Hashimoto's thyroiditis   . Hiatal hernia   . History of colon polyps   . Hx of Hashimoto thyroiditis   . Hypercholesteremia   . Hypertension   . Hypothyroidism, acquired, autoimmune   . IBS (irritable bowel syndrome)   . Meniere's disease of left ear   . Migraine without aura and without status migrainosus, not intractable   . Multinodular goiter (nontoxic)   . Nonexudative macular degeneration   . Raynaud's syndrome without gangrene   . Situational stress   . Tachycardia   . Thyroiditis, autoimmune   . Ulcerative colitis, unspecified, without complications ([place])   . Vitamin B 12 deficiency   . Vitamin D deficiency   . Wears glasses     Tobacco History: Social History   Tobacco Use  Smoking Status Former Smoker  . Packs/day: 0.50  . Years: 42.00  . Pack years: 21.00  . Types: Cigarettes  . Quit date: 02/21/2001  . Years since quitting: 18.6  Smokeless Tobacco Never Used   Counseling given: Not Answered   Continue to not smoke  Outpatient Encounter Medications as of 10/18/2019  Medication Sig  . albuterol (VENTOLIN HFA) 108 (90 Base) MCG/ACT inhaler Inhale 1-2 puffs into the lungs every 6 (six) hours as needed for wheezing or shortness of breath.  Marland Kitchen aspirin EC 81 MG tablet Take 1 tablet (81 mg total) by mouth daily.  . Cyanocobalamin (VITAMIN B 12 PO) Take by mouth daily.  . DULoxetine (CYMBALTA) 30 MG capsule TAKE 3 CAPSULES BY MOUTH  ONCE DAILY ONCE A DAY ORALLY 30  . Fluticasone-Umeclidin-Vilant (TRELEGY ELLIPTA) 100-62.5-25 MCG/INH AEPB TAKE 1 PUFF BY MOUTH EVERY DAY  . furosemide (LASIX) 20 MG tablet TAKE 1 TABLET BY MOUTH EVERY DAY AS NEEDED FOR SWELLING  . hydrochlorothiazide (HYDRODIURIL) 25 MG tablet TAKE 1 TABLET BY MOUTH EVERY DAY IN THE MORNING  . LINZESS 72 MCG capsule Take 72 mcg by mouth daily. Takes 145 mcg  . LORazepam (ATIVAN) 0.5 MG tablet TAKE 1/2 TAB EVERY 8 12 HOURS AS NEEDED FOR ANXIOUSNESS  . losartan (COZAAR) 100 MG tablet Take 100 mg by mouth daily.  . meclizine (ANTIVERT) 25 MG tablet 1/2 to 1 tablet by mouth every 8 hrs as needed for diziness   . Multiple Vitamin (  MULTIVITAMIN) tablet Take 1 tablet by mouth daily.    Marland Kitchen omeprazole (PRILOSEC) 20 MG capsule Take 20 mg by mouth daily.  Marland Kitchen Respiratory Therapy Supplies (FLUTTER) DEVI Use device 2-3 times a day to break up congestion  . rosuvastatin (CRESTOR) 20 MG tablet Take 1 tablet (20 mg total) by mouth daily.  Marland Kitchen SYNTHROID 88 MCG tablet TAKE 1 TABLET (88 MCG TOTAL) BY MOUTH DAILY BEFORE BREAKFAST.  Marland Kitchen VITAMIN D, CHOLECALCIFEROL, PO Take 50,000 Units by mouth once a week.   . [DISCONTINUED] albuterol (VENTOLIN HFA) 108 (90 Base) MCG/ACT inhaler Inhale 1-2 puffs into the lungs every 6 (six) hours as needed for wheezing or shortness of breath.  . [DISCONTINUED] TRELEGY ELLIPTA 100-62.5-25 MCG/INH AEPB TAKE 1 PUFF BY MOUTH EVERY DAY   No facility-administered encounter medications on file as of 10/18/2019.     Review of Systems  Review of Systems  Constitutional: Negative for activity change, fatigue and fever.  HENT: Negative for sinus pressure, sinus pain and sore throat.   Respiratory: Negative for cough, shortness of breath and wheezing.   Cardiovascular: Negative for chest pain and palpitations.  Gastrointestinal: Negative for diarrhea, nausea and vomiting.  Musculoskeletal: Negative for arthralgias.       Fibromyalgia, lower extremity  neuropathy, 2 recent falls  Neurological: Negative for dizziness.  Psychiatric/Behavioral: Negative for sleep disturbance. The patient is not nervous/anxious.      Physical Exam  BP 110/62 (BP Location: Left Arm, Patient Position: Sitting, Cuff Size: Normal)   Pulse 99   Temp 98.1 F (36.7 C) (Temporal)   Ht 5\' 4"  (1.626 m)   Wt 158 lb 9.6 oz (71.9 kg)   SpO2 92%   BMI 27.22 kg/m   Wt Readings from Last 5 Encounters:  10/18/19 158 lb 9.6 oz (71.9 kg)  10/10/19 159 lb 14.4 oz (72.5 kg)  04/11/19 161 lb 9.6 oz (73.3 kg)  03/25/19 161 lb (73 kg)  12/29/18 160 lb (72.6 kg)    BMI Readings from Last 5 Encounters:  10/18/19 27.22 kg/m  10/10/19 27.88 kg/m  04/11/19 31.18 kg/m  03/25/19 27.21 kg/m  12/29/18 27.90 kg/m     Physical Exam Vitals and nursing note reviewed.  Constitutional:      General: She is not in acute distress.    Appearance: Normal appearance. She is normal weight.  HENT:     Head: Normocephalic and atraumatic.     Right Ear: External ear normal.     Left Ear: External ear normal.     Nose: Nose normal. No congestion.     Mouth/Throat:     Mouth: Mucous membranes are moist.     Pharynx: Oropharynx is clear.  Eyes:     Pupils: Pupils are equal, round, and reactive to light.  Cardiovascular:     Rate and Rhythm: Normal rate and regular rhythm.     Pulses: Normal pulses.     Heart sounds: Normal heart sounds. No murmur heard.   Pulmonary:     Effort: Pulmonary effort is normal. No respiratory distress.     Breath sounds: Normal breath sounds. No decreased air movement. No decreased breath sounds, wheezing or rales.  Musculoskeletal:     Cervical back: Normal range of motion.     Right lower leg: No edema.     Left lower leg: No edema.  Skin:    General: Skin is warm and dry.     Capillary Refill: Capillary refill takes less than 2 seconds.  Neurological:  General: No focal deficit present.     Mental Status: She is alert and oriented  to person, place, and time. Mental status is at baseline.     Gait: Gait normal.  Psychiatric:        Mood and Affect: Mood normal.        Behavior: Behavior normal.        Thought Content: Thought content normal.        Judgment: Judgment normal.       Assessment & Plan:   Stage 3 severe COPD by GOLD classification ([place]) Plan: Continue Trelegy Ellipta Continue rescue inhaler Follow-up in 6 months Continue flutter valve use Work on increasing overall physical mobility Recommend seasonal high-dose flu vaccine  Nocturnal hypoxemia due to emphysema (HCC) Plan: Continue oxygen therapy at night  Bronchiectasis without complication (HCC) Plan: Continue flutter valve use    Return in about 6 months (around 04/19/2020), or if symptoms worsen or fail to improve, for Follow up with [name] FNP-C, Follow up with Dr. Purnell Shoemaker.   Lauraine Rinne, NP 10/18/2019   This appointment required 32 minutes of patient care (this includes precharting, chart review, review of results, face-to-face care, etc.).

## 2019-10-18 NOTE — Assessment & Plan Note (Signed)
Plan: Continue Trelegy Ellipta Continue rescue inhaler Follow-up in 6 months Continue flutter valve use Work on increasing overall physical mobility Recommend seasonal high-dose flu vaccine

## 2019-10-24 ENCOUNTER — Other Ambulatory Visit: Payer: Self-pay | Admitting: Family Medicine

## 2019-10-24 DIAGNOSIS — Z1231 Encounter for screening mammogram for malignant neoplasm of breast: Secondary | ICD-10-CM

## 2019-11-03 ENCOUNTER — Ambulatory Visit
Admission: RE | Admit: 2019-11-03 | Discharge: 2019-11-03 | Disposition: A | Discharge status: Home / Self Care | Payer: Medicare Other | Source: Ambulatory Visit | Attending: Family Medicine | Admitting: Family Medicine

## 2019-11-03 ENCOUNTER — Other Ambulatory Visit: Payer: Self-pay

## 2019-11-03 DIAGNOSIS — Z1231 Encounter for screening mammogram for malignant neoplasm of breast: Secondary | ICD-10-CM

## 2019-11-04 ENCOUNTER — Ambulatory Visit: Payer: Medicare Other

## 2019-12-09 ENCOUNTER — Telehealth: Payer: Self-pay | Admitting: Internal Medicine

## 2019-12-09 NOTE — Telephone Encounter (Signed)
Attempted to call pt but unable to reach and unable to leave VM. Tried to call back but still unable to reach.  Will try to call back later.

## 2019-12-12 ENCOUNTER — Telehealth: Payer: Self-pay | Admitting: Internal Medicine

## 2019-12-12 MED ORDER — TRELEGY ELLIPTA 100-62.5-25 MCG/INH IN AEPB: INHALATION_SPRAY | RESPIRATORY_TRACT | 3 refills | Status: DC

## 2019-12-12 NOTE — Telephone Encounter (Signed)
LMTCB

## 2019-12-12 NOTE — Telephone Encounter (Signed)
Patient is returning phone call. Patient phone number is [phone]. 

## 2019-12-12 NOTE — Telephone Encounter (Signed)
ATC again, no answer and no VM  Closing per protocol

## 2019-12-12 NOTE — Telephone Encounter (Signed)
Shelby Lin to [employer] A     1:44 PM pt in donut hole needing Triglogy trying to get help,can't afford wanting script sent through [facility] pt assist prgram fax to [phone]  Spoke with the pt  She states that she spoke with [facility] and they will help her with cost of Trelegy  We just need to fax 90 day supply rx to them at the number she provided  I have done so and nothing further needed

## 2020-01-04 ENCOUNTER — Other Ambulatory Visit (INDEPENDENT_AMBULATORY_CARE_PROVIDER_SITE_OTHER): Payer: Self-pay | Admitting: "Endocrinology

## 2020-01-04 DIAGNOSIS — E034 Atrophy of thyroid (acquired): Secondary | ICD-10-CM

## 2020-01-10 ENCOUNTER — Encounter (INDEPENDENT_AMBULATORY_CARE_PROVIDER_SITE_OTHER): Payer: Self-pay | Admitting: "Endocrinology

## 2020-01-10 ENCOUNTER — Ambulatory Visit (INDEPENDENT_AMBULATORY_CARE_PROVIDER_SITE_OTHER): Payer: Medicare Other | Admitting: "Endocrinology

## 2020-01-10 ENCOUNTER — Other Ambulatory Visit: Payer: Self-pay

## 2020-01-10 VITALS — BP 116/60 | HR 84 | Wt 157.4 lb

## 2020-01-10 DIAGNOSIS — R7303 Prediabetes: Secondary | ICD-10-CM | POA: Diagnosis not present

## 2020-01-10 DIAGNOSIS — R1013 Epigastric pain: Secondary | ICD-10-CM

## 2020-01-10 DIAGNOSIS — E063 Autoimmune thyroiditis: Secondary | ICD-10-CM

## 2020-01-10 DIAGNOSIS — E049 Nontoxic goiter, unspecified: Secondary | ICD-10-CM

## 2020-01-10 DIAGNOSIS — R490 Dysphonia: Secondary | ICD-10-CM

## 2020-01-10 DIAGNOSIS — E559 Vitamin D deficiency, unspecified: Secondary | ICD-10-CM

## 2020-01-10 DIAGNOSIS — I10 Essential (primary) hypertension: Secondary | ICD-10-CM

## 2020-01-10 DIAGNOSIS — G250 Essential tremor: Secondary | ICD-10-CM

## 2020-01-10 DIAGNOSIS — E663 Overweight: Secondary | ICD-10-CM

## 2020-01-10 LAB — POCT GLYCOSYLATED HEMOGLOBIN (HGB A1C): Hemoglobin A1C: 6.3 % — AB (ref 4.0–5.6)

## 2020-01-10 LAB — POCT GLUCOSE (DEVICE FOR HOME USE): Glucose Fasting, POC: 129 mg/dL — AB (ref 70–99)

## 2020-01-10 NOTE — Progress Notes (Signed)
Subjective:  Patient Name: Shelby Lin Date of Birth: 21-Nov-1940  MRN: [id number]  Shelby Lin  presents to the office today for follow-up of her hypothyroidism secondary to Hashimoto's disease, questionable nodular goiter, tachycardia, tremor, dyspepsia, fatigue, fibromyalgia, hyperlipidemia, night sweats, tremor, and pre-diabetes.  HISTORY OF PRESENT ILLNESS:   Shelby Lin is a 79 y.o. Caucasian woman.  Salam was unaccompanied.  1. Shelby Lin was referred to me on 03/25/2005 by her primary care provider, Dr. Carol Ada, for evaluation of her thyroid problems. Shelby Lin was 2 at that time.   A. At about age 38 the patient developed thyroid problems and was put on a thyroid medicine. Later when she became pregnant her obstetrician took her off that medicine. At about age 45, she developed severe fatigue and weight gain. She was put back on Synthroid. Her dose of Synthroid was 88 mcg per day for many years. In January 2006, she had fluctuations in her thyroid function tests. Synthroid was increased to 100 mcg and later to 125 mcg per day. The Synthroid dose was then reduced to 75 mcg. After blood tests about 9 months prior to her first appointment with me, the patient was put back on a dose of 88 mcg per day. When the patient was having fluctuations in her thyroid tests, her thyroid gland became visibly enlarged and felt full and tender to palpation. She was also more hoarse during those periods. Hoarseness came and went in parallel with the thyroid gland swelling. Ultrasound studies of the thyroid in January 2006 and January 2007 showed a multinodular goiter.   B. Thyroid function tests performed at that first visit showed a TSH of 4.139, free T4 1.22, and free T3 of 2.7. Because any TSH at that time greater than 3.0 was considered to be elevated physiologically, I increased her Synthroid from 88 to 100 mcg per day. TPO antibody was markedly positive at 954.0, c/w the diagnosis of Hashimoto's  thyroiditis. Her TSI level was quite normal at 0.9.   2. During the last 14 years the patient has had several medical issues that we have followed:  A. She has had several flare-ups of Hashimoto's disease, resulting in several changes in her doses of Synthroid. Her Synthroid dose was subsequently decreased to 88 mcg/day in January 2018.   B. Although she was initially thought to have a multinodular goiter, over time it became clear that all but one of the nodules were actually areas of echotexture heterogeneity due to Hashimoto's thyroiditis. She has had one "nodularish" area of her left inferior pole that has remained unchanged since 2011. This area is ill-defined and may also just represent several overlapping areas of heterogeneity.  C. She has also had problems with fibromyalgia, arthritis, hypertension, depression, fatigue, tremor, hyperlipidemia, reflux, dyspepsia, and vitamin D deficiency.   D. In August 2016 her HbA1c was 6.2%, c/w prediabetes. Since hen her HbA1c values have varied from 5.8% to 6.4%. The patient refused to take metformin because three of her relatives lost their scalp hair when they took metformin.    3. The patient's last [facility] visit was on 10/10/19. At that visit I continued her Synthroid dose of 88 mcg/day for 6 days each week, but only 1/2 tablet on the 7th days.and continued her MVI and Biotech.   A. In the interim she has been healthy.  B. She is still "suffering" from chronic constipation.  Linzess caused diarrhea. She is now on Senokot daily.   C. She has not had any  additional falls.      D. Her COPD is about the same. When she wears a mask it feels to her as if she is having more difficulty breathing, especially if she is more physically active, especially in the humid weather. She is not having many allergy problems now.      E. Her tension headaches are "about the same". Cervical stretching helps when she does her cervical stretching exercises regularly.   F. She is  still very fatigued. Her energy has been"a little bit low". She says that she sleeps well. Her husband has been complaining about her snoring, but she has not awakened gasping for breath.     G. She has not had any hot spells or sweats recently.    H. She is not depressed. Her family problems are unchanged.      I. She continues to have problems with fibromyalgia and arthritis in her wrists, hands, knees, and feet, worse in the cold weather.   H. Her feet are doing much better since having [employer] therapy. .     I. Her Meniere's disease has not been active recently, in that she has had more mild vertigo. She does not have ringing and roaring in her ears very often. Shelby Lin. She is hoarse sometimes. The hoarseness still comes and goes in association with her allergies.   K. She remains on her omeprazole, 20 mg twice daily. She still takes Synthroid, 88 mcg/day for 6 days each week, but only 1/2 tablet of Sundays. She also takes an MVI and Biotech vitamin D, 50,000 IU/weekly. Her cardiologist increased her rosuvastatin to 20 mg/day.   L. She has not had any sweet tea since her last visit. Sh has reduced her rice intake.     4. Pertinent Review of Systems:  Constitutional: "I feel okay."  Her stamina and energy are about the same. The SOB is better.  Eyes: Vision is good as long as she wears her glasses. She had an eye exam in late 2020. She is due for follow up soon. She does have mild cataracts. There were no other significant eye problems noted.  Neck: The thyroid gland has not seemed to swell lately. The patient has no complaints of anterior neck soreness, tenderness,  pressure, discomfort, or difficulty swallowing.  Heart: She has not had any other chest symptoms. Heart rate increases with exercise or other physical activity. The patient has no other complaints of palpitations, irregular heat beats, chest pain, or chest pressure. Gastrointestinal: As above. Omeprazole is usually successful in  controlling her acid reflux, indigestion, and dyspepsia.  Hands: She still has her tremor.  Legs: Muscle mass and strength seem normal. Legs are stronger when she walks more. She no longer has episodic leg numbness and burning. She has had some edema occasionally, more in the left leg.  Feet: Her feet still burn in the forefeet at times.    GYN: She still gets hot flashes, but much less frequently and much less intensely.    Neuro: Her neuropathy in her feet is about the same.  Emotional/psychiatric: She feels "okay".    Mental: She does "well" at thinking, paying attention, remembering, and making decisions. "I still have all my marbles." She remembers to tell me that same sentence every time she visits.    PAST MEDICAL, FAMILY, AND SOCIAL HISTORY:  Past Medical History:  Diagnosis Date  . Anxiety   . Arthritis   . Asthma in child   .  Atherosclerotic heart disease of native coronary artery without angina pectoris   . Cataracts, bilateral   . Combined hyperlipidemia   . Constipation, unspecified   . COPD (chronic obstructive pulmonary disease) ([place])   . Coronary artery spasm ([place])   . Diabetic neuropathy ([place])   . DM (diabetes mellitus) with complications ([place])   . Dupuytren's contracture   . Dyspepsia   . Familial tremor   . Fatigue   . Fibromyalgia syndrome   . GERD (gastroesophageal reflux disease)   . Hashimoto's thyroiditis   . Hiatal hernia   . History of colon polyps   . Hx of Hashimoto thyroiditis   . Hypercholesteremia   . Hypertension   . Hypothyroidism, acquired, autoimmune   . IBS (irritable bowel syndrome)   . Meniere's disease of left ear   . Migraine without aura and without status migrainosus, not intractable   . Multinodular goiter (nontoxic)   . Nonexudative macular degeneration   . Raynaud's syndrome without gangrene   . Situational stress   . Tachycardia   . Thyroiditis, autoimmune   . Ulcerative colitis, unspecified, without complications ([place])   .  Vitamin B 12 deficiency   . Vitamin D deficiency   . Wears glasses     Family History  Problem Relation Age of Onset  . Cancer Mother   . Hypertension Mother   . Stroke Mother   . Heart attack Father   . Sudden death Father   . Hypertension Sister   . Diabetes Sister   . Stroke Sister   . Cancer Maternal Uncle        breast,   . Cancer Paternal Aunt        breast, ovarian  . Thyroid disease Neg Hx      Current Outpatient Medications:  .  albuterol (VENTOLIN HFA) 108 (90 Base) MCG/ACT inhaler, Inhale 1-2 puffs into the lungs every 6 (six) hours as needed for wheezing or shortness of breath., Disp: 18 g, Rfl: 6 .  aspirin EC 81 MG tablet, Take 1 tablet (81 mg total) by mouth daily., Disp: , Rfl:  .  Cyanocobalamin (VITAMIN B 12 PO), Take by mouth daily., Disp: , Rfl:  .  DULoxetine (CYMBALTA) 30 MG capsule, TAKE 3 CAPSULES BY MOUTH ONCE DAILY ONCE A DAY ORALLY 30, Disp: , Rfl:  .  Fluticasone-Umeclidin-Vilant (TRELEGY ELLIPTA) 100-62.5-25 MCG/INH AEPB, TAKE 1 PUFF BY MOUTH EVERY DAY, Disp: 180 each, Rfl: 3 .  furosemide (LASIX) 20 MG tablet, TAKE 1 TABLET BY MOUTH EVERY DAY AS NEEDED FOR SWELLING, Disp: , Rfl: 4 .  hydrochlorothiazide (HYDRODIURIL) 25 MG tablet, TAKE 1 TABLET BY MOUTH EVERY DAY IN THE MORNING, Disp: , Rfl: 1 .  LORazepam (ATIVAN) 0.5 MG tablet, TAKE 1/2 TAB EVERY 8 12 HOURS AS NEEDED FOR ANXIOUSNESS, Disp: , Rfl:  .  losartan (COZAAR) 100 MG tablet, Take 100 mg by mouth daily., Disp: , Rfl: 1 .  meclizine (ANTIVERT) 25 MG tablet, 1/2 to 1 tablet by mouth every 8 hrs as needed for diziness , Disp: , Rfl:  .  Multiple Vitamin (MULTIVITAMIN) tablet, Take 1 tablet by mouth daily.  , Disp: , Rfl:  .  omeprazole (PRILOSEC) 20 MG capsule, Take 20 mg by mouth daily., Disp: , Rfl:  .  Respiratory Therapy Supplies (FLUTTER) DEVI, Use device 2-3 times a day to break up congestion, Disp: 1 each, Rfl: 0 .  rosuvastatin (CRESTOR) 20 MG tablet, Take 1 tablet (20 mg total) by  mouth  daily., Disp: 90 tablet, Rfl: 3 .  SYNTHROID 88 MCG tablet, TAKE 1 TABLET (88 MCG TOTAL) BY MOUTH DAILY BEFORE BREAKFAST., Disp: 30 tablet, Rfl: 5 .  VITAMIN D, CHOLECALCIFEROL, PO, Take 50,000 Units by mouth once a week. , Disp: , Rfl:  .  LINZESS 72 MCG capsule, Take 72 mcg by mouth daily. Takes 145 mcg (Patient not taking: Reported on 01/10/2020), Disp: , Rfl:   Allergies as of 01/10/2020 - Review Complete 01/10/2020  Allergen Reaction Noted  . Acyclovir and related  04/01/2017  . Allegra [fexofenadine] Hives 04/28/2018  . Amoxicillin Hives 06/18/2010  . Codeine Nausea And Vomiting 02/21/2013  . Enalapril maleate Cough 04/28/2018  . Keflex [cephalexin] Hives 04/28/2018  . Levofloxacin Other (See Comments) 04/28/2018  . Lorabid [loracarbef] Hives 06/18/2010  . Sulfa antibiotics Hives 06/18/2010  . Sulfur Hives 09/08/2016    1. Work and Family: She retired in June 2013, but later went back to work part-time. She stopped part-time work in May 2020. She has not been as busy during the pandemic. Her husband retired at the same time.    2. Activities: She has not been walking much.   3. Smoking, alcohol, or drugs: None 4. Primary Care Provider: Dr. Carol Ada, [facility] [place]: There are no other significant problems involving Rielly's other body systems.   Objective:  Vital Signs:  BP 116/60   Pulse 84   Wt 157 lb 6.4 oz (71.4 kg)   BMI 27.02 kg/m   Wt Readings from Last 3 Encounters:  01/10/20 157 lb 6.4 oz (71.4 kg)  10/18/19 158 lb 9.6 oz (71.9 kg)  10/10/19 159 lb 14.4 oz (72.5 kg)    Ht Readings from Last 3 Encounters:  10/18/19 5\' 4"  (1.626 m)  10/10/19 5' 3.5" (1.613 m)  04/11/19 5' 0.36" (1.533 m)    HC Readings from Last 3 Encounters:  No data found for [facility]   Facility age limit for growth percentiles is 20 years.  Body mass index is 27.02 kg/m. Facility age limit for growth percentiles is 20 years.  Body surface  area is 1.8 meters squared.  PHYSICAL EXAMINATION  Constitutional: [place] looks good today. She is alert, bright, and upbeat. Her affect and insight are normal. Her weight has decreased 1 pound since her last visit.  Eyes: There is no obvious arcus or proptosis. Moisture appears normal. Mouth: The oropharynx and tongue appear normal. Oral moisture is normal. There is no oral hyperpigmentation. Neck: The neck appears to be visibly normal. No carotid bruits are noted. The thyroid gland is again within normal limits 18-20 grams in size. The thyroid gland is not tender to palpation. Left trapezius muscle: She again has about a 4-5 mm nodular area in the skin superficial to the mid-portion of the left trapezius muscle. This area feels like a sebaceous cyst. I told her that if she is worried about this area she may need a referral to dermatology.  Lungs: The lungs are clear to auscultation. Air movement is fair.   Heart: Heart rate and rhythm are regular. Heart sounds S1 and S2 are normal. I did not appreciate any pathologic cardiac murmurs. Abdomen: The abdomen is enlarged. Bowel sounds are normal. There is no obvious hepatomegaly, splenomegaly, or other mass effect. The abdomen is not tender to palpation. Arms: Muscle size and bulk are normal for age.  Hands: There is a 1+ tremor. Phalangeal and metacarpophalangeal joints are normal. Palmar muscles are low-normal.  Palmar moisture is normal. There is no palmar erythema. Palms are warm. Legs: Muscles appear normal for age. No edema is present.  Neurologic: Strength is normal for age in both the upper and lower extremities. Muscle tone is normal. Sensation to touch is normal in the legs and feet.   LAB DATA:   Labs 01/10/20: HbA1c 6.3%, CBG 129  Labs 10/10/19; HbA1c 6.5%, CBG 132  Labs 10/05/19: TSH 1.26, free 4 1.4, free T3 3.3; PTH 20, calcium 10.0, 25-OH vitamin D 46  Labs 04/11/19: HbA1c 6.3%, CBG 139  Labs 04/01/19: TSH 1.77, free T4  1.3, free  T3 3.1  Labs 01/11/19:; HbA1c 6.2%; TSH 2.72, free T4 1.2, free T3 3.0; PTH 24, calcium 10.0, 25-OH vitamin D 58  Labs 10/05/18: HbA1c 6.3%; TSH 0.34, free T4 1.2, free T3 3.0; PTH 18, calcium 9.9, 25-OH vitamin D 43  Labs 04/09/18: HbA1c 6.4%, CBG 115  Labs 04/08/18: TSH 1.02, free T4 1.4, free T3 2.4; PTH 16, calcium 9.5, 25-OH vitamin D 45  Labs 10/07/17: HbA1c 6.2%, CBG 135  Labs 10/02/17: TSH 0.66, free T4 1.3, free T3.9; PTH 36, calcium 9.6, 25-OH vitamin D 53 2  Labs 04/20/17: HbA1c 6.0%, CBG 112  Labs 04/16/17: TSH 1.12, free 4 1.5, free T3 3.2; 25-OH vitamin D 55  Labs 10/13/16: HbA1c 6.1%, CBG 122  Labs 10/07/16: TSH 2.63, free T4 1.3, free T3 2.9; 25-OH vitamin D 47  Labs 06/05/16: HbA1c 5.8%, fasting glucose 120; TSH  1.40, free T4 1.1, free T3 2.7  Labs 02/22/16: HbA1c 6.4%, CBG 104, C-peptide 1.57 (ref 080-3.85); PTH 28, calcium 10, 25-OH vitamin D 45; TSH 0.16, free T4 1.3, free T3 2.8; CMP normal  Labs 11/11/15: TSH 0.05, free T4 1.5, free T3 3.6  Labs 08/14/15: HbA1c 5.8%  Labs 04/27/15: HbA1c 6.3%; TSH 1.04, free T4 1.3, free T3 2.8; PTH 45, calcium 9.4, 25-OH vitamin D 19  Labs 04/24/15: C-peptide 1.43 (normal 0.80-3.90)  Labs 12/04/14: HbA1c 6.1%  Labs 09/26/14 at 12:01 PM: CMP normal; HbA1c 6.2%;CBC normal, iron 81; ACTH 21, cortisol 12.9; vitamin B12 705 (normal 211-911)  09/19/14: TSH 1.640, free T4 1.03, free T3 2.6; Calcium 9.3, PTH 33 (increased from 26 in March), 25-OH vitamin D 21 (decreased from 23 in march)  04/28/14: TSH 0.081, free T4 1.47, free T3 3.3; calcium 9.4, PTH 26, 25-OH vitamin D 23  10/27/13: TSH 1.087, free T4 1.04, free T3 2.7; Calcium 9.6, PTH 32, 25-hydroxyvitamin D 34  05/18/13: TSH 0.052, free T4 1.24, free T3 3.2  04/28/13: TSH 0.426, free T4 1.24, free T3 3.2; calcium 9.7, 25-hydroxy vitamin D 36  10/08/12: TSH 2.106, free T4 1.05, free T3 2.6; 25-OH vitamin D 37, PTH 39.4, calcium 9.1  04/02/12: TSH 1.073, free T4 1.31, free T3 2.9,  calcium 9.5, 25-hydroxy vitamin D 31 - PTH was ordered but not done. I have re-ordered it this morning.  09/30/11: TSH 8.095, free T4 1.16, free T3 2.4. PTH 24, calcium 9.3, 25-hydroxy vitamin D 33, 1,25-dihydroxy vitamin D 95 11/21/10: Calcium was 9.5. Her PTH was 37.6. 25-hydroxy vitamin D was 41. 1, 25-dihydroxy vitamin D was 85, which was slightly elevated. B12 was 352 (211-911)    IMAGING:   12/07/14 US thyroid: Both lobes are smaller, with maximum dimensions <2.5 cm. Both lobes are heterogenous in echotexture. The previously seen exophytic, solid, hypoechoic, ill-defined nodule in the left inferior pole remains at 0.9 x 0.4 x 0.5 cm and is unchanged from 08/23/2009.  10/12/12: US thyroid gland: Small 10 x 4 x 5 mm nodule or adjacent lymph node or parathyroid adenoma. Diffuse echogenicity. As I read the [place], there is definitely a lot of echogenicity c/w Hashimoto's thyroiditis. There may be a small nodule that is actually a bit less than one cm in longest dimension. This is unchanged since June 2011.   Assessment and Plan:   ASSESSMENT:  1. Hypothyroid:   A. She has acquired hypothyroidism due to Hashimoto's thyroiditis. During the past 14 years her TFTs have been variable, requiring changes in her Synthroid dosage. Part of the variability was due to flare ups of Hashimoto's disease causing Hashitoxicosis.   B. Her TFTs in February 2019 were in the middle of the real physiologic normal range on her current Synthroid dose. Her TFTs in August 2019 were more variable. Her TSH was lower, her free T4 was lower, and her free T3 was higher. This pattern is most c/w having had a recent  flare up of thyroiditis. In contrast, her TFTS in February 2020 were mid-normal.   C. At her August 2020 visit her TSH was low, her free T4 was lower, and her free T3 was higher. It is likely that she has been having another flare up of thyroiditis. It was possible, however, that her tremor was due to relatively high  thyroid hormone levels. It was prudent to reduce her Synthroid dose a bit and repeat her lab tests in two months.   D. Her TFTs in August 2021 were in goal range, but she was still mildly tremulous. The tremor is not caused by hyperthyroidism. .  2. Thyroiditis: The patient's Hashimoto's disease has usually been clinically quiescent, but was mildly Hashitoxic in the January-March 2016 time period. Her thyroid US in October 2016 showed the heterogeneity c/w Hashimoto's thyroiditis. As noted above, she appeared to have had a recent flare up of thyroiditis prior to her August 2019 visit and again prior to her August 2020 visit. Her thyroiditis is clinically quiescent again today.  3. Multinodular goiter:   A. The thyroid gland was again within normal limits for size at her February 2020 visit.   B. Her thyroid gland was borderline enlarged at her August 2020 visit, with the left lobe being a bit larger than the right. I dd not palpate any nodular area.   C. At today's visit her thyroid gland is again normal in size.   D. The waxing and waning of thyroid gland size is c/w evolving Hashimoto's disease.   E. Although some [place] studies have shown a nodule, her [place] study in June 2011 showed either a questionable nodule or demonstrated the echotexture heterogeneity that is commonly seen with Hashimoto's disease. Her [place] studies in August 2014 and October 2016 were essentially unchanged. She did not need a FNA at those times. 4. Headaches: Headaches appear to be "tension" headaches. They have significantly decreased in frequency and severity. She does better when she performs her cervical stretching exercises daily.   5. Hot flashes and sweats: These problems have stopped.    6-7. Leg pains/vitamin D deficiency disease:   A. Her labs in July 2016 showed that her calcium was normal at 9.3, just below the 50%. Her PTH value was mid-range normal. Her vitamin D was lower. She needed more calcium and vitamin D. I had  previously asked her to take Citracal-D at doses of 1200-1500 mg of calcium per day and [phone] IU of vitamin D per day.   B. Her  labs in March 2017 showed that her PTH value of 45 was higher, but still mid-normal. Calcium was a bit higher at 9.4. Vitamin D was lower at 19. At that point she agreed to purchase and take the Biotech form of vitamin D, one 50,000 IU capsule each week.   C. Her PTH, calcium and vitamin D were normal in December 2017, although the vitamin D had decreased.   D. She is now taking Biotech vitamin D, 50,000 units each week.  Her vitamin D levels were normal again in August 2018, in August 2019, in February 2020, in August 2020, in November 2020, and in August 2021. Her PTH was lower in February 2020 and August 2020, slightly higher in November 2020, lower in August 2021, but appropriate to her calcium level.  8. Meniere's disease: Her vertigo has been quiescent. Her Meniere's disease has bothered her intermittently in the past few months. Her hearing in the left ear varies.  9. Hoarseness: In the past, the fact that her hoarseness and post nasal drip came and went essentially in parallel indicate that the hoarseness was likely due to flare ups of her allergies. In addition, several flare ups of Hashimoto's disease have also caused hoarseness in the past. Her hoarseness has not been bothering her much recently.  10. Dyspepsia: This problem is reasonably well controlled with Protonix. 11. Hypertension: Her BP is normal today on her losartan.     13. Depression: Her affect and insight are very good today.   14. Fibromyalgia syndrome: This condition remains a problem for her, but seems to have worsened.  Exercise will help her "re-charge her mitochondrial batteries".  15. Fatigue:  This problem has also worsened, generally paralleling her COPD. 16. Prediabetes: Her HbA1c values in July and in October 2016 were elevated into the "pre-diabetes" range. Her HbA1c values have fluctuated  over time depending upon her diet and her activity levels.  Her HbA1c today was lower today in November 2021 than it was in August, but still in the prediabetes range.  I asked her to tighten down on her carb intake.I instructed her on the Eat Right Diet and the [place] recipes. I will see her again in 3 months   17. Arthritis: Her arthritis seems to be doing fairly well.   96. Overweight: Ms. Boulanger is a bit less overweight. 19. Tremor: Her tremor has varied in the past and is a bit worse today. If the tremor worsens she may benefit from a referral to neurology.      PLAN:  1. Diagnostic: We will do her lab tests today.   2. Therapeutic: Try to do daily physical activity. Eat Right Diet and [facility] Diet recipes. Watch what she eats and drinks. Continue Synthroid to 88 mcg/day for 6 days each week, but take only 1/2 pill on the 7th days. Take MVI daily and Biotech, 50,000 IU per week. Take one Tums at bedtime.  3. Patient education: We again discussed the issues of prediabetes, headaches, Hashimoto's disease, hypothyroidism, fibromyalgia, fatigue, and COPD. We discussed what she needs to do to control her BGs, to include getting more exercise and following our Eat Right Diet. She knows what she needs to do to get her weight back under control. She is trying hard.  Unfortunately, her COPD and the cold weather make that increasingly more difficult.    4. Follow-up: 3 months  Level of Service: This visit lasted in excess of 55 minutes. More than 50% of  the visit was devoted to counseling.   Tillman Sers, MD, CDE Adult and Pediatric Endocrinology

## 2020-01-10 NOTE — Patient Instructions (Signed)
Follow up visit in 3 months. 

## 2020-01-11 LAB — TSH: TSH: 0.8 mIU/L (ref 0.40–4.50)

## 2020-01-11 LAB — PTH, INTACT AND CALCIUM
Calcium: 9.8 mg/dL (ref 8.6–10.4)
PTH: 31 pg/mL (ref 14–64)

## 2020-01-11 LAB — T3, FREE: T3, Free: 3.1 pg/mL (ref 2.3–4.2)

## 2020-01-11 LAB — VITAMIN D 25 HYDROXY (VIT D DEFICIENCY, FRACTURES): Vit D, 25-Hydroxy: 64 ng/mL (ref 30–100)

## 2020-01-11 LAB — T4, FREE: Free T4: 1.6 ng/dL (ref 0.8–1.8)

## 2020-03-05 DIAGNOSIS — J449 Chronic obstructive pulmonary disease, unspecified: Secondary | ICD-10-CM | POA: Diagnosis not present

## 2020-03-05 DIAGNOSIS — E039 Hypothyroidism, unspecified: Secondary | ICD-10-CM | POA: Diagnosis not present

## 2020-03-05 DIAGNOSIS — E1169 Type 2 diabetes mellitus with other specified complication: Secondary | ICD-10-CM | POA: Diagnosis not present

## 2020-03-05 DIAGNOSIS — E7800 Pure hypercholesterolemia, unspecified: Secondary | ICD-10-CM | POA: Diagnosis not present

## 2020-03-05 DIAGNOSIS — I10 Essential (primary) hypertension: Secondary | ICD-10-CM | POA: Diagnosis not present

## 2020-03-05 DIAGNOSIS — M797 Fibromyalgia: Secondary | ICD-10-CM | POA: Diagnosis not present

## 2020-03-05 DIAGNOSIS — E1140 Type 2 diabetes mellitus with diabetic neuropathy, unspecified: Secondary | ICD-10-CM | POA: Diagnosis not present

## 2020-03-05 DIAGNOSIS — K5900 Constipation, unspecified: Secondary | ICD-10-CM | POA: Diagnosis not present

## 2020-03-05 DIAGNOSIS — K219 Gastro-esophageal reflux disease without esophagitis: Secondary | ICD-10-CM | POA: Diagnosis not present

## 2020-03-09 DIAGNOSIS — D3132 Benign neoplasm of left choroid: Secondary | ICD-10-CM | POA: Diagnosis not present

## 2020-03-19 DIAGNOSIS — Z01812 Encounter for preprocedural laboratory examination: Secondary | ICD-10-CM | POA: Diagnosis not present

## 2020-03-22 DIAGNOSIS — K5730 Diverticulosis of large intestine without perforation or abscess without bleeding: Secondary | ICD-10-CM | POA: Diagnosis not present

## 2020-03-22 DIAGNOSIS — K5900 Constipation, unspecified: Secondary | ICD-10-CM | POA: Diagnosis not present

## 2020-03-22 DIAGNOSIS — K921 Melena: Secondary | ICD-10-CM | POA: Diagnosis not present

## 2020-03-22 DIAGNOSIS — K648 Other hemorrhoids: Secondary | ICD-10-CM | POA: Diagnosis not present

## 2020-03-27 NOTE — Progress Notes (Deleted)
CARDIOLOGY OFFICE NOTE  Date:  03/27/2020    Hansel Starling Date of Birth: [birth year] Medical Record #[id number]  PCP:  Carol Ada, MD  Cardiologist:  Marlou Porch    No chief complaint on file.   History of Present Illness: Shelby Lin is a 80 y.o. female who presents today for a follow up visit. Seen for Dr. Marlou Porch.   He has a history of normal coronaries from cath in 2014 but coronary atherosclerosis of LAD noted on CT scan in 01/2018, hypertension, hyperlipidemia, pre-diabetes, COPD on 2L of O2 at night, hypothyroidism, Raynaud's syndrome, fibromyalgia, and breast cancer.   Last seen by [facility] in November of 2020. Was felt to be doing well. Has chronic DOE due to her COPD. Using prn Lasix.   Comes in today. Here with   Past Medical History:  Diagnosis Date  . Anxiety   . Arthritis   . Asthma in child   . Atherosclerotic heart disease of native coronary artery without angina pectoris   . Cataracts, bilateral   . Combined hyperlipidemia   . Constipation, unspecified   . COPD (chronic obstructive pulmonary disease) ([place])   . Coronary artery spasm ([place])   . Diabetic neuropathy ([place])   . DM (diabetes mellitus) with complications ([place])   . Dupuytren's contracture   . Dyspepsia   . Familial tremor   . Fatigue   . Fibromyalgia syndrome   . GERD (gastroesophageal reflux disease)   . Hashimoto's thyroiditis   . Hiatal hernia   . History of colon polyps   . Hx of Hashimoto thyroiditis   . Hypercholesteremia   . Hypertension   . Hypothyroidism, acquired, autoimmune   . IBS (irritable bowel syndrome)   . Meniere's disease of left ear   . Migraine without aura and without status migrainosus, not intractable   . Multinodular goiter (nontoxic)   . Nonexudative macular degeneration   . Raynaud's syndrome without gangrene   . Situational stress   . Tachycardia   . Thyroiditis, autoimmune   . Ulcerative colitis, unspecified, without complications ([place])   . Vitamin B  12 deficiency   . Vitamin D deficiency   . Wears glasses     Past Surgical History:  Procedure Laterality Date  . BREAST EXCISIONAL BIOPSY Left    benign  . BREAST EXCISIONAL BIOPSY Right    milk gland removed  . BREAST LUMPECTOMY  1990   left  . BREAST LUMPECTOMY WITH NEEDLE LOCALIZATION Right 02/28/2013   Procedure: RIGHT BREAST NEEDLE LOCALIZATION LUMPECTOMY ;  Surgeon: Edward Jolly, MD;  Location: [place];  Service: General;  Laterality: Right;  . CHOLECYSTECTOMY  2008   lapcholi  . DILATION AND CURETTAGE OF UTERUS    . LEFT HEART CATHETERIZATION WITH CORONARY ANGIOGRAM N/A 01/26/2013   Procedure: LEFT HEART CATHETERIZATION WITH CORONARY ANGIOGRAM;  Surgeon: Candee Furbish, MD;  Location[facility] CATH LAB;  Service: Cardiovascular;  Laterality: N/A;  . PARTIAL HYSTERECTOMY  1971  . TONSILLECTOMY       Medications: No outpatient medications have been marked as taking for the 03/28/20 encounter (Appointment) with Burtis Junes, NP.     Allergies: Allergies  Allergen Reactions  . Acyclovir And Related   . Allegra [Fexofenadine] Hives  . Amoxicillin Hives  . Codeine Nausea And Vomiting  . Elemental Sulfur Hives  . Enalapril Maleate Cough  . Keflex [Cephalexin] Hives  . Levofloxacin Other (See Comments)    insomnia  . Lorabid [Loracarbef]  Hives  . Sulfa Antibiotics Hives    Social History: The patient  reports that she quit smoking about 19 years ago. Her smoking use included cigarettes. She has a 21.00 pack-year smoking history. She has never used smokeless tobacco. She reports that she does not drink alcohol and does not use drugs.   Family History: The patient's ***family history includes Cancer in her maternal uncle, mother, and paternal aunt; Diabetes in her sister; Heart attack in her father; Hypertension in her mother and sister; Stroke in her mother and sister; Sudden death in her father.   Review of Systems: Please see the history of present  illness.   All other systems are reviewed and negative.   Physical Exam: VS:  There were no vitals taken for this visit. Marland Kitchen  BMI There is no height or weight on file to calculate BMI.  Wt Readings from Last 3 Encounters:  01/10/20 157 lb 6.4 oz (71.4 kg)  10/18/19 158 lb 9.6 oz (71.9 kg)  10/10/19 159 lb 14.4 oz (72.5 kg)    General: Pleasant. Well developed, well nourished and in no acute distress.   HEENT: Normal.  Neck: Supple, no JVD, carotid bruits, or masses noted.  Cardiac: ***Regular rate and rhythm. No murmurs, rubs, or gallops. No edema.  Respiratory:  Lungs are clear to auscultation bilaterally with normal work of breathing.  GI: Soft and nontender.  MS: No deformity or atrophy. Gait and ROM intact.  Skin: Warm and dry. Color is normal.  Neuro:  Strength and sensation are intact and no gross focal deficits noted.  Psych: Alert, appropriate and with normal affect.   LABORATORY DATA:  EKG:  EKG {ACTION; IS/IS GI:087931 ordered today.  Personally reviewed by me. This demonstrates ***.  Lab Results  Component Value Date   WBC 14.0 (H) 02/28/2018   HGB 11.6 (L) 02/28/2018   HCT 37.0 02/28/2018   PLT 382 02/28/2018   GLUCOSE 133 (H) 02/28/2018   CHOL 134 08/31/2018   TRIG 103 08/31/2018   HDL 72 08/31/2018   LDLCALC 41 08/31/2018   ALT 13 08/31/2018   AST 16 02/22/2016   NA 136 02/28/2018   K 3.5 02/28/2018   CL 98 02/28/2018   CREATININE 0.63 02/28/2018   BUN 12 02/28/2018   CO2 29 02/28/2018   TSH 0.80 01/10/2020   INR 1.0 01/17/2013   HGBA1C 6.3 (A) 01/10/2020       BNP (last 3 results) No results for input(s): BNP in the last 8760 hours.  ProBNP (last 3 results) No results for input(s): PROBNP in the last 8760 hours.   Other Studies Reviewed Today:  Left Cardiac Catheterization 01/26/2013: Impressions: 1. No angiographically significant CAD 2. Normal left ventricular systolic function.  LVEDP 14 mmHg.  Ejection fraction 65  %. _______________   Chest CT 01/28/2018: Findings: Cardiovascular: Normal heart size. No significant pericardial effusion/thickening. Left anterior descending coronary atherosclerosis. Atherosclerotic nonaneurysmal thoracic aorta. Normal caliber pulmonary arteries.   Impressions: 1. Right middle lobe small solid pulmonary nodules are stable to decreased since 02/11/2017 chest CT, considered benign. No new significant pulmonary nodules. 2. Stable minimal scattered cylindrical bronchiectasis in medial upper lobes and medial right middle lobe. No evidence of active pulmonary infection. 3. One vessel coronary atherosclerosis. 4. Small hiatal hernia.     ASSESSMENT & PLAN:     1. CAD  2. HTN  3. HLD  4. DM  5. COPD  6. DM     Coronary Artery Atherosclerosis  -  Last cardiac cath in 2014 showed no angiographically significant CAD. However, coronary atherosclerosis of LAD noted on chest CT in 01/2018.  - No acute changes on EKG. - No angina. - Continue aspirin and statin.  - Continue primary prevention with aggressive risk factor/lifesytle modifications. Discussed increasing activity to 150 minutes per week and following heart healthy diet.      Hypertension - BP well controlled - 122/70 today. - Continue Losartan 100mg  daily and HCTZ 25mg  daily. Continue Lasix as needed for lower extremity edema (she does not need this frequently). - Renal function and potassium stable on labs in 08/2018 ([facility]).   Hyperlipidemia - Lipid panel from 08/2018: Total Cholesterol 134, Triglycerides 103, HDL 72, LDL 41.  - At LDL goal <70 given CAD. - Continue Crestor 20mg  daily.  - Labs followed by PCP.   Pre-Diabetes - Hemoglobin A1c 6.3 in 08/2018. - Recommended increased physical activity and heart healthy diet. - Followed by PCP.   Family History of CAD - Father died of sudden heart attack in his 2's and sister had multiple heart attacks and strokes before dying in her 29's. -  Continue aggressive risk factor/lifestyle modifications as above.   GERD - Controlled on Omeprazole.   COPD - Patient has 21 pack year smoking history but quit in 2002. On 2L of supplemental O2 via nasal cannula at night.  - Followed by Pulmonology.   Hypothyroidism - Followed by PCP.  Current medicines are reviewed with the patient today.  The patient does not have concerns regarding medicines other than what has been noted above.  The following changes have been made:  See above.  Labs/ tests ordered today include:   No orders of the defined types were placed in this encounter.    Disposition:   FU with *** in {gen number 1-61:[id number]} {Days to years:10300}.   Patient is agreeable to this plan and will call if any problems develop in the interim.   SignedTruitt Merle, NP  03/27/2020 3:58 PM  [facility] [address] [place], [place]  [address] Phone: [phone] Fax: [phone]

## 2020-03-28 ENCOUNTER — Ambulatory Visit: Payer: Medicare Other | Admitting: Nurse Practitioner

## 2020-04-05 DIAGNOSIS — J449 Chronic obstructive pulmonary disease, unspecified: Secondary | ICD-10-CM | POA: Diagnosis not present

## 2020-04-06 DIAGNOSIS — K5900 Constipation, unspecified: Secondary | ICD-10-CM | POA: Diagnosis not present

## 2020-04-16 NOTE — Progress Notes (Signed)
Subjective:  Patient Name: Shelby Lin Date of Birth: [birth year]  MRN: [id number]  Shelby Lin  presents to the office today for follow-up of her hypothyroidism secondary to Hashimoto's disease, questionable nodular goiter, tachycardia, tremor, dyspepsia, fatigue, fibromyalgia, hyperlipidemia, night sweats, tremor, and pre-diabetes.  HISTORY OF PRESENT ILLNESS:   Shelby Lin is a 80 y.o. Caucasian woman.  Shelby Lin was unaccompanied.  1. Shelby Lin was referred to me on 03/25/2005 by her primary care provider, Dr. Carol Ada, for evaluation of her thyroid problems. Shelby Lin was 5 at that time.   A. At about age 35 the patient developed thyroid problems and was put on a thyroid medicine. Later when she became pregnant her obstetrician took her off that medicine. At about age 11, she developed severe fatigue and weight gain. She was put back on Synthroid. Her dose of Synthroid was 88 mcg per day for many years. In January 2006, she had fluctuations in her thyroid function tests. Synthroid was increased to 100 mcg and later to 125 mcg per day. The Synthroid dose was then reduced to 75 mcg. After blood tests about 9 months prior to her first appointment with me, the patient was put back on a dose of 88 mcg per day. When the patient was having fluctuations in her thyroid tests, her thyroid gland became visibly enlarged and felt full and tender to palpation. She was also more hoarse during those periods. Hoarseness came and went in parallel with the thyroid gland swelling. Ultrasound studies of the thyroid in January 2006 and January 2007 showed a multinodular goiter.   B. Thyroid function tests performed at that first visit showed a TSH of 4.139, free T4 1.22, and free T3 of 2.7. Because any TSH at that time greater than 3.0 was considered to be elevated physiologically, I increased her Synthroid from 88 to 100 mcg per day. TPO antibody was markedly positive at 954.0, c/w the diagnosis of Hashimoto's  thyroiditis. Her TSI level was quite normal at 0.9.   2. During the last 15 years the patient has had several medical issues that we have followed:  A. She has had several flare-ups of Hashimoto's disease, resulting in several changes in her doses of Synthroid. Her Synthroid dose was subsequently decreased to 88 mcg/day in January 2018.   B. Although she was initially thought to have a multinodular goiter, over time it became clear that all but one of the nodules were actually areas of echotexture heterogeneity due to Hashimoto's thyroiditis. She has had one "nodularish" area of her left inferior pole that has remained unchanged since 2011. This area is ill-defined and may also just represent several overlapping areas of heterogeneity.  C. She has also had problems with fibromyalgia, arthritis, hypertension, depression, fatigue, tremor, hyperlipidemia, reflux, dyspepsia, and vitamin D deficiency.   D. In August 2016 her HbA1c was 6.2%, c/w prediabetes. Since hen her HbA1c values have varied from 5.8% to 6.4%. The patient refused to take metformin because three of her relatives lost their scalp hair when they took metformin.    3. The patient's last [facility] visit was on 01/10/20. At that visit I continued her Synthroid dose of 88 mcg/day for 6 days each week, but only 1/2 tablet on the 7th days and continued her MVI and Biotech.   A. In the interim she has been healthy.  B. She still has chronic constipation.  Linzess caused diarrhea. She is now on a stool softener daily. She had normal colonoscopy about 4 weeks  ago.   C. She has not had any additional falls.      D. Her COPD is about the same. When she wears a mask it feels to her as if she is having more difficulty breathing, especially if she is more physically active, especially in the humid weather. She is not having many allergy problems now.      E. Her tension headaches are "about the same". Cervical stretching helps when she does her cervical  stretching exercises regularly.   F. She is always fatigued. Her energy has been "a little bit low". She says that she sleeps well. Her husband has been complaining about her snoring, but she has not awakened gasping for breath.     G. She still has nocturnal hot flashes occasionally.     H. She is doing well emotionally.  Her family problems are unchanged.      I. She continues to have problems with fibromyalgia and arthritis in her wrists, hands, knees, and feet, worse in the cold weather.   H. Her feet are doing better since having Shelby Lin therapy. .     I. Her Meniere's disease has been active recently, in that she has had more mild vertigo and some ringing and roaring in her ears at times.  J. She is hoarse sometimes, but not a lot. The hoarseness still comes and goes in association with her allergies.   K. She remains on her omeprazole, 20 mg twice daily. She still takes Synthroid, 88 mcg/day for 6 days each week, but only 1/2 tablet of Sundays. She also takes an MVI and Biotech vitamin D, 50,000 IU/weekly. Her cardiologist increased her rosuvastatin to 20 mg/day.   L. She has not had sweet tea very often. She has reduced her rice and bread intake.     4. Pertinent Review of Systems:  Constitutional: "I feel alright." Some days are better than others.  Her stamina and energy are about the same. The SOB is better.  Eyes: Vision is good as long as she wears her glasses. She had an eye exam in January 2022. She is due for follow up soon. She does have mild cataracts. She has a consultation this week re cataractectomy. There were no other significant eye problems noted.  Neck: The thyroid gland has not seemed to swell lately. The patient has no complaints of anterior neck soreness, tenderness,  pressure, discomfort, or difficulty swallowing.  Heart: She has not had any other chest symptoms. Heart rate increases with exercise or other physical activity. The patient has no other complaints of  palpitations, irregular heat beats, chest pain, or chest pressure. Gastrointestinal: As above. Omeprazole is usually successful in controlling her acid reflux, indigestion, and dyspepsia.  Hands: She still has her tremor.  Legs: Muscle mass and strength seem normal. Legs are stronger when she walks more. She no longer has episodic leg numbness and burning. She has had some edema occasionally, more in the left leg.  Feet: Her feet still burn in the forefeet at times.    GYN: She still gets hot flashes, but much less frequently and much less intensely.    Neuro: Her neuropathy in her feet is about the same.  Emotional/psychiatric: She feels "alright".    Mental: She does "well" at thinking, paying attention, remembering, and making decisions. "I still have all my marbles." She remembers to tell me that same sentence every time she visits.    PAST MEDICAL, FAMILY, AND SOCIAL HISTORY:  Past Medical  History:  Diagnosis Date  . Anxiety   . Arthritis   . Asthma in child   . Atherosclerotic heart disease of native coronary artery without angina pectoris   . Cataracts, bilateral   . Combined hyperlipidemia   . Constipation, unspecified   . COPD (chronic obstructive pulmonary disease) ([place])   . Coronary artery spasm ([place])   . Diabetic neuropathy ([place])   . DM (diabetes mellitus) with complications ([place])   . Dupuytren's contracture   . Dyspepsia   . Familial tremor   . Fatigue   . Fibromyalgia syndrome   . GERD (gastroesophageal reflux disease)   . Hashimoto's thyroiditis   . Hiatal hernia   . History of colon polyps   . Hx of Hashimoto thyroiditis   . Hypercholesteremia   . Hypertension   . Hypothyroidism, acquired, autoimmune   . IBS (irritable bowel syndrome)   . Meniere's disease of left ear   . Migraine without aura and without status migrainosus, not intractable   . Multinodular goiter (nontoxic)   . Nonexudative macular degeneration   . Raynaud's syndrome without gangrene   .  Situational stress   . Tachycardia   . Thyroiditis, autoimmune   . Ulcerative colitis, unspecified, without complications ([place])   . Vitamin B 12 deficiency   . Vitamin D deficiency   . Wears glasses     Family History  Problem Relation Age of Onset  . Cancer Mother   . Hypertension Mother   . Stroke Mother   . Heart attack Father   . Sudden death Father   . Hypertension Sister   . Diabetes Sister   . Stroke Sister   . Cancer Maternal Uncle        breast,   . Cancer Paternal Aunt        breast, ovarian  . Thyroid disease Neg Hx      Current Outpatient Medications:  .  aspirin EC 81 MG tablet, Take 1 tablet (81 mg total) by mouth daily., Disp: , Rfl:  .  Cyanocobalamin (VITAMIN B 12 PO), Take by mouth daily., Disp: , Rfl:  .  DULoxetine (CYMBALTA) 30 MG capsule, TAKE 3 CAPSULES BY MOUTH ONCE DAILY ONCE A DAY ORALLY 30, Disp: , Rfl:  .  Fluticasone-Umeclidin-Vilant (TRELEGY ELLIPTA) 100-62.5-25 MCG/INH AEPB, TAKE 1 PUFF BY MOUTH EVERY DAY, Disp: 180 each, Rfl: 3 .  hydrochlorothiazide (HYDRODIURIL) 25 MG tablet, TAKE 1 TABLET BY MOUTH EVERY DAY IN THE MORNING, Disp: , Rfl: 1 .  LORazepam (ATIVAN) 0.5 MG tablet, TAKE 1/2 TAB EVERY 8 12 HOURS AS NEEDED FOR ANXIOUSNESS, Disp: , Rfl:  .  losartan (COZAAR) 100 MG tablet, Take 100 mg by mouth daily., Disp: , Rfl: 1 .  Multiple Vitamin (MULTIVITAMIN) tablet, Take 1 tablet by mouth daily., Disp: , Rfl:  .  Multiple Vitamins-Minerals (PRESERVISION AREDS) TABS, See admin instructions., Disp: , Rfl:  .  omeprazole (PRILOSEC) 20 MG capsule, Take 20 mg by mouth daily., Disp: , Rfl:  .  Respiratory Therapy Supplies (FLUTTER) DEVI, Use device 2-3 times a day to break up congestion, Disp: 1 each, Rfl: 0 .  rosuvastatin (CRESTOR) 20 MG tablet, Take 1 tablet (20 mg total) by mouth daily., Disp: 90 tablet, Rfl: 3 .  SYNTHROID 88 MCG tablet, TAKE 1 TABLET (88 MCG TOTAL) BY MOUTH DAILY BEFORE BREAKFAST., Disp: 30 tablet, Rfl: 5 .  VITAMIN D,  CHOLECALCIFEROL, PO, Take 50,000 Units by mouth once a week. , Disp: , Rfl:  .  albuterol (VENTOLIN HFA) 108 (90 Base) MCG/ACT inhaler, Inhale 1-2 puffs into the lungs every 6 (six) hours as needed for wheezing or shortness of breath. (Patient not taking: Reported on 04/17/2020), Disp: 18 g, Rfl: 6 .  furosemide (LASIX) 20 MG tablet, TAKE 1 TABLET BY MOUTH EVERY DAY AS NEEDED FOR SWELLING (Patient not taking: Reported on 04/17/2020), Disp: , Rfl: 4 .  LINZESS 72 MCG capsule, Take 72 mcg by mouth daily. Takes 145 mcg (Patient not taking: No sig reported), Disp: , Rfl:  .  meclizine (ANTIVERT) 25 MG tablet, 1/2 to 1 tablet by mouth every 8 hrs as needed for diziness  (Patient not taking: Reported on 04/17/2020), Disp: , Rfl:   Allergies as of 04/17/2020 - Review Complete 01/10/2020  Allergen Reaction Noted  . Acyclovir and related  04/01/2017  . Allegra [fexofenadine] Hives 04/28/2018  . Amoxicillin Hives 06/18/2010  . Codeine Nausea And Vomiting 02/21/2013  . Elemental sulfur Hives 09/08/2016  . Enalapril maleate Cough 04/28/2018  . Keflex [cephalexin] Hives 04/28/2018  . Levofloxacin Other (See Comments) 04/28/2018  . Lorabid [loracarbef] Hives 06/18/2010  . Sulfa antibiotics Hives 06/18/2010    1. Work and Family: She retired in June 2013, but later went back to work part-time. She stopped part-time work in May 2020. She has not been as busy during the pandemic. Her husband retired at the same time.    2. Activities: She has been walking much outside.   3. Smoking, alcohol, or drugs: None 4. Primary Care Provider: Dr. Carol Ada, [facility] [place]: There are no other significant problems involving Shelby Lin's other body systems.   Objective:  Vital Signs:  BP 112/68   Pulse 68   Wt 158 lb 3.2 oz (71.8 kg)   BMI 27.15 kg/m   Wt Readings from Last 3 Encounters:  04/17/20 158 lb 3.2 oz (71.8 kg)  01/10/20 157 lb 6.4 oz (71.4 kg)  10/18/19 158 lb 9.6  oz (71.9 kg)    Ht Readings from Last 3 Encounters:  10/18/19 5\' 4"  (1.626 m)  10/10/19 5' 3.5" (1.613 m)  04/11/19 5' 0.36" (1.533 m)    HC Readings from Last 3 Encounters:  No data found for [facility]   Facility age limit for growth percentiles is 20 years.  Body mass index is 27.15 kg/m. Facility age limit for growth percentiles is 20 years.  Body surface area is 1.8 meters squared.  PHYSICAL EXAMINATION  Constitutional: Camren looks good today. She is alert, bright, and upbeat. Her affect and insight are normal. Her weight has increased 12 ounces since her last visit.  Eyes: There is no obvious arcus or proptosis. Moisture appears normal. Mouth: The oropharynx and tongue appear normal. Oral moisture is normal. There is no oral hyperpigmentation. Neck: The neck appears to be visibly normal. No carotid bruits are noted. The thyroid gland is again within normal limits of 18-20 grams in size. The thyroid gland is not tender to palpation. Left trapezius muscle: I do not feel the 4-5 mm nodular area in the skin superficial to the mid-portion of the left trapezius muscle. This area had felt like a sebaceous cyst.  Lungs: The lungs are clear to auscultation. Air movement is fair.   Heart: Heart rate and rhythm are regular. Heart sounds S1 and S2 are normal. I did not appreciate any pathologic cardiac murmurs. Abdomen: The abdomen is enlarged. Bowel sounds are normal. There is no obvious hepatomegaly, splenomegaly, or other mass  effect. The abdomen is not tender to palpation. Arms: Muscle size and bulk are normal for age.  Hands: There is a 1+ tremor. Phalangeal and metacarpophalangeal joints are normal. Palmar muscles are low-normal. Palmar moisture is normal. There is no palmar erythema. Palms are warm. Legs: Muscles appear normal for age. No edema is present.  Feet: She has 1+ DP pulses. Neurologic: Strength is normal for age in both the upper and lower extremities. Muscle tone is normal.  Sensation to touch is normal in the legs and feet.   LAB DATA:   Labs 04/17/20: HbA1c 6.3%, CBG 128  Labs 01/10/20: HbA1c 6.3%, CBG 129; TSH 0.80, free T4 1.6, free T3 3.1; PTH 31, calcium 9.8, 25-OH vitamin D 64  Labs 10/10/19; HbA1c 6.5%, CBG 132  Labs 10/05/19: TSH 1.26, free 4 1.4, free T3 3.3; PTH 20, calcium 10.0, 25-OH vitamin D 46  Labs 04/11/19: HbA1c 6.3%, CBG 139  Labs 04/01/19: TSH 1.77, free T4  1.3, free T3 3.1  Labs 01/11/19:; HbA1c 6.2%; TSH 2.72, free T4 1.2, free T3 3.0; PTH 24, calcium 10.0, 25-OH vitamin D 58  Labs 10/05/18: HbA1c 6.3%; TSH 0.34, free T4 1.2, free T3 3.0; PTH 18, calcium 9.9, 25-OH vitamin D 43  Labs 04/09/18: HbA1c 6.4%, CBG 115  Labs 04/08/18: TSH 1.02, free T4 1.4, free T3 2.4; PTH 16, calcium 9.5, 25-OH vitamin D 45  Labs 10/07/17: HbA1c 6.2%, CBG 135  Labs 10/02/17: TSH 0.66, free T4 1.3, free T3.9; PTH 36, calcium 9.6, 25-OH vitamin D 53 2  Labs 04/20/17: HbA1c 6.0%, CBG 112  Labs 04/16/17: TSH 1.12, free 4 1.5, free T3 3.2; 25-OH vitamin D 55  Labs 10/13/16: HbA1c 6.1%, CBG 122  Labs 10/07/16: TSH 2.63, free T4 1.3, free T3 2.9; 25-OH vitamin D 47  Labs 06/05/16: HbA1c 5.8%, fasting glucose 120; TSH  1.40, free T4 1.1, free T3 2.7  Labs 02/22/16: HbA1c 6.4%, CBG 104, C-peptide 1.57 (ref 080-3.85); PTH 28, calcium 10, 25-OH vitamin D 45; TSH 0.16, free T4 1.3, free T3 2.8; CMP normal  Labs 11/11/15: TSH 0.05, free T4 1.5, free T3 3.6  Labs 08/14/15: HbA1c 5.8%  Labs 04/27/15: HbA1c 6.3%; TSH 1.04, free T4 1.3, free T3 2.8; PTH 45, calcium 9.4, 25-OH vitamin D 19  Labs 04/24/15: C-peptide 1.43 (normal 0.80-3.90)  Labs 12/04/14: HbA1c 6.1%  Labs 09/26/14 at 12:01 PM: CMP normal; HbA1c 6.2%;CBC normal, iron 81; ACTH 21, cortisol 12.9; vitamin B12 705 (normal 211-911)  09/19/14: TSH 1.640, free T4 1.03, free T3 2.6; Calcium 9.3, PTH 33 (increased from 26 in March), 25-OH vitamin D 21 (decreased from 23 in march)  04/28/14: TSH 0.081, free T4  1.47, free T3 3.3; calcium 9.4, PTH 26, 25-OH vitamin D 23  10/27/13: TSH 1.087, free T4 1.04, free T3 2.7; Calcium 9.6, PTH 32, 25-hydroxyvitamin D 34  05/18/13: TSH 0.052, free T4 1.24, free T3 3.2  04/28/13: TSH 0.426, free T4 1.24, free T3 3.2; calcium 9.7, 25-hydroxy vitamin D 36  10/08/12: TSH 2.106, free T4 1.05, free T3 2.6; 25-OH vitamin D 37, PTH 39.4, calcium 9.1  04/02/12: TSH 1.073, free T4 1.31, free T3 2.9, calcium 9.5, 25-hydroxy vitamin D 31 - PTH was ordered but not done. I have re-ordered it this morning.  09/30/11: TSH 8.095, free T4 1.16, free T3 2.4. PTH 24, calcium 9.3, 25-hydroxy vitamin D 33, 1,25-dihydroxy vitamin D 95 11/21/10: Calcium was 9.5. Her PTH was 37.6. 25-hydroxy vitamin D was 41. 1, 25-dihydroxy  vitamin D was 85, which was slightly elevated. B12 was 352 (211-911)    IMAGING:   12/07/14 US thyroid: Both lobes are smaller, with maximum dimensions <2.5 cm. Both lobes are heterogenous in echotexture. The previously seen exophytic, solid, hypoechoic, ill-defined nodule in the left inferior pole remains at 0.9 x 0.4 x 0.5 cm and is unchanged from 08/23/2009.   10/12/12: US thyroid gland: Small 10 x 4 x 5 mm nodule or adjacent lymph node or parathyroid adenoma. Diffuse echogenicity. As I read the [place], there is definitely a lot of echogenicity c/w Hashimoto's thyroiditis. There may be a small nodule that is actually a bit less than one cm in longest dimension. This is unchanged since June 2011.   Assessment and Plan:   ASSESSMENT:  1. Hypothyroid:   A. She has acquired hypothyroidism due to Hashimoto's thyroiditis. During the past 14 years her TFTs have been variable, requiring several changes in her Synthroid dosage. Part of the variability was due to flare ups of Hashimoto's disease causing Hashitoxicosis.   B. Her TFTs in February 2019 were in the middle of the real physiologic normal range on her current Synthroid dose. Her TFTs in August 2019 were more variable.  Her TSH was lower, her free T4 was lower, and her free T3 was higher. This pattern is most c/w having had a recent  flare up of thyroiditis. In contrast, her TFTs in February 2020 were mid-normal.   C. At her August 2020 visit her TSH was low, her free T4 was lower, and her free T3 was higher. It was likely that she had been having another flare up of thyroiditis. It was possible, however, that her tremor was due to relatively high thyroid hormone levels. It was prudent to reduce her Synthroid dose a bit and repeat her lab tests in two months.   D. Her TFTs in August 2021 and October were in goal range, but she was still mildly tremulous. The tremor is not caused by hyperthyroidism. .  2. Thyroiditis: The patient's Hashimoto's disease has usually been clinically quiescent, but was mildly Hashitoxic in the January-March 2016 time period. Her thyroid US in October 2016 showed the heterogeneity c/w Hashimoto's thyroiditis. As noted above, she appeared to have had a recent flare up of thyroiditis prior to her August 2019 visit and again prior to her August 2020 visit. Her thyroiditis is clinically quiescent again today.  3. Multinodular goiter:   A. The thyroid gland was again within normal limits for size at her February 2020 visit.   B. Her thyroid gland was borderline enlarged at her August 2020 visit, with the left lobe being a bit larger than the right. I dd not palpate any nodular area.   C. In October 2021 and again at today's visit her thyroid gland is normal in size.   D. The waxing and waning of thyroid gland size is c/w evolving Hashimoto's disease.   E. Although some [place] studies have shown a nodule, her [place] study in June 2011 showed either a questionable nodule or demonstrated the echotexture heterogeneity that is commonly seen with Hashimoto's disease. Her [place] studies in August 2014 and October 2016 were essentially unchanged. She did not need a FNA at those times. 4. Headaches: Headaches appear to  be "tension" headaches. They have significantly decreased in frequency and severity. She does better when she performs her cervical stretching exercises daily.   5. Hot flashes and sweats: These problems have stopped.    6-7. Leg  pains/vitamin D deficiency disease:   A. Her labs in July 2016 showed that her calcium was normal at 9.3, just below the 50%. Her PTH value was mid-range normal. Her vitamin D was lower. She needed more calcium and vitamin D. I had previously asked her to take Citracal-D at doses of 1200-1500 mg of calcium per day and [phone] IU of vitamin D per day.   B. Her labs in March 2017 showed that her PTH value of 45 was higher, but still mid-normal. Calcium was a bit higher at 9.4. Vitamin D was lower at 19. At that point she agreed to purchase and take the Biotech form of vitamin D, one 50,000 IU capsule each week.   C. Her PTH, calcium and vitamin D were normal in December 2017, although the vitamin D had decreased.   D. She is now taking Biotech vitamin D, 50,000 units each week.  Her vitamin D levels were normal again in August 2018, in August 2019, in February 2020, in August 2020, in November 2020, in August 2021, and again in October 2021. Her PTH was lower in February 2020 and August 2020, slightly higher in November 2020, lower in August 2021 and October 2021, but appropriate to her calcium level.  8. Meniere's disease: Her vertigo has been fairly quiescent, but she occasionally notes symptoms. . Her Meniere's disease has bothered her intermittently in the past few months. Her hearing in the left ear varies.  9. Hoarseness: In the past, the fact that her hoarseness and post nasal drip came and went essentially in parallel indicate that the hoarseness was likely due to flare ups of her allergies. In addition, several flare ups of Hashimoto's disease have also caused hoarseness in the past. Her hoarseness has not been bothering her much recently.  10. Dyspepsia: This problem is  reasonably well controlled with Protonix. 11. Hypertension: Her BP is normal today on her losartan.     13. Depression: Her affect and insight are very good today.   14. Fibromyalgia syndrome: This condition remains a problem for her, but seems to have worsened.  Exercise will help her "re-charge her mitochondrial batteries".  15. Fatigue:  This problem has also worsened, generally paralleling her COPD. 16. Prediabetes: Her HbA1c values in July and in October 2016 were elevated into the "pre-diabetes" range. Her HbA1c values have fluctuated over time depending upon her diet and her activity levels.  Her HbA1c today was lower today in November 2021 than it was in August, but still in the prediabetes range.  I asked her to tighten down on her carb intake. I instructed her on the Eat Right Diet and the [place] recipes.I offered to start metformin, but she declined. I will see her again in 3 months   17. Arthritis: Her arthritis seems to be doing fairly well.   7. Overweight: Ms. Montijo is a bit more overweight. 19. Tremor: Her tremor has varied in the past and is about the same today. If the tremor worsens she may benefit from a referral to neurology.      PLAN:  1. Diagnostic: Repeat labs prior to next visit: TFTs, PTH, calcium, 25-Oh vitamin D 2. Therapeutic: Try to do daily physical activity. Eat Right Diet and [facility] Diet recipes. Watch what she eats and drinks. Continue Synthroid to 88 mcg/day for 6 days each week, but take only 1/2 pill on the 7th days. Take MVI daily and Biotech, 50,000 IU per week. Take one Tums at  bedtime.  3. Patient education: We again discussed the issues of prediabetes, headaches, Hashimoto's disease, hypothyroidism, fibromyalgia, fatigue, and COPD. We discussed what she needs to do to control her BGs, to include getting more exercise and following our Eat Right Diet. She knows what she needs to do to get her weight back under control. She is trying hard.   Unfortunately, her COPD and the cold weather make that increasingly more difficult.    4. Follow-up: 4 months  Level of Service: This visit lasted in excess of 60 minutes. More than 50% of the visit was devoted to counseling.   Tillman Sers, MD, CDE Adult and Pediatric Endocrinology

## 2020-04-17 ENCOUNTER — Other Ambulatory Visit: Payer: Self-pay

## 2020-04-17 ENCOUNTER — Ambulatory Visit (INDEPENDENT_AMBULATORY_CARE_PROVIDER_SITE_OTHER): Payer: Medicare Other | Admitting: "Endocrinology

## 2020-04-17 ENCOUNTER — Encounter (INDEPENDENT_AMBULATORY_CARE_PROVIDER_SITE_OTHER): Payer: Self-pay | Admitting: "Endocrinology

## 2020-04-17 VITALS — BP 112/68 | HR 68 | Wt 158.2 lb

## 2020-04-17 DIAGNOSIS — G250 Essential tremor: Secondary | ICD-10-CM | POA: Diagnosis not present

## 2020-04-17 DIAGNOSIS — E063 Autoimmune thyroiditis: Secondary | ICD-10-CM | POA: Diagnosis not present

## 2020-04-17 DIAGNOSIS — E042 Nontoxic multinodular goiter: Secondary | ICD-10-CM | POA: Diagnosis not present

## 2020-04-17 DIAGNOSIS — R7303 Prediabetes: Secondary | ICD-10-CM

## 2020-04-17 DIAGNOSIS — H8103 Meniere's disease, bilateral: Secondary | ICD-10-CM | POA: Diagnosis not present

## 2020-04-17 DIAGNOSIS — I10 Essential (primary) hypertension: Secondary | ICD-10-CM | POA: Diagnosis not present

## 2020-04-17 DIAGNOSIS — E559 Vitamin D deficiency, unspecified: Secondary | ICD-10-CM | POA: Diagnosis not present

## 2020-04-17 DIAGNOSIS — E663 Overweight: Secondary | ICD-10-CM

## 2020-04-17 DIAGNOSIS — R1013 Epigastric pain: Secondary | ICD-10-CM

## 2020-04-17 LAB — POCT GLUCOSE (DEVICE FOR HOME USE): POC Glucose: 128 mg/dl — AB (ref 70–99)

## 2020-04-17 LAB — POCT GLYCOSYLATED HEMOGLOBIN (HGB A1C): Hemoglobin A1C: 6.3 % — AB (ref 4.0–5.6)

## 2020-04-17 NOTE — Patient Instructions (Signed)
Follow up visit in 4 months. Please repeat lab tests 1-2 weeks prior. 

## 2020-04-18 DIAGNOSIS — G43009 Migraine without aura, not intractable, without status migrainosus: Secondary | ICD-10-CM | POA: Diagnosis not present

## 2020-04-18 DIAGNOSIS — E118 Type 2 diabetes mellitus with unspecified complications: Secondary | ICD-10-CM | POA: Diagnosis not present

## 2020-04-18 DIAGNOSIS — I10 Essential (primary) hypertension: Secondary | ICD-10-CM | POA: Diagnosis not present

## 2020-04-18 DIAGNOSIS — G8929 Other chronic pain: Secondary | ICD-10-CM | POA: Diagnosis not present

## 2020-04-18 DIAGNOSIS — E039 Hypothyroidism, unspecified: Secondary | ICD-10-CM | POA: Diagnosis not present

## 2020-04-18 DIAGNOSIS — J449 Chronic obstructive pulmonary disease, unspecified: Secondary | ICD-10-CM | POA: Diagnosis not present

## 2020-04-18 DIAGNOSIS — I2510 Atherosclerotic heart disease of native coronary artery without angina pectoris: Secondary | ICD-10-CM | POA: Diagnosis not present

## 2020-04-18 DIAGNOSIS — K219 Gastro-esophageal reflux disease without esophagitis: Secondary | ICD-10-CM | POA: Diagnosis not present

## 2020-04-18 DIAGNOSIS — E1169 Type 2 diabetes mellitus with other specified complication: Secondary | ICD-10-CM | POA: Diagnosis not present

## 2020-04-18 DIAGNOSIS — E7800 Pure hypercholesterolemia, unspecified: Secondary | ICD-10-CM | POA: Diagnosis not present

## 2020-04-18 DIAGNOSIS — E1140 Type 2 diabetes mellitus with diabetic neuropathy, unspecified: Secondary | ICD-10-CM | POA: Diagnosis not present

## 2020-04-19 ENCOUNTER — Ambulatory Visit: Payer: Medicare Other | Admitting: Pulmonary Disease

## 2020-04-19 DIAGNOSIS — H25043 Posterior subcapsular polar age-related cataract, bilateral: Secondary | ICD-10-CM | POA: Diagnosis not present

## 2020-04-19 DIAGNOSIS — H353131 Nonexudative age-related macular degeneration, bilateral, early dry stage: Secondary | ICD-10-CM | POA: Diagnosis not present

## 2020-04-19 DIAGNOSIS — H25013 Cortical age-related cataract, bilateral: Secondary | ICD-10-CM | POA: Diagnosis not present

## 2020-04-19 DIAGNOSIS — H2512 Age-related nuclear cataract, left eye: Secondary | ICD-10-CM | POA: Diagnosis not present

## 2020-04-19 DIAGNOSIS — H2513 Age-related nuclear cataract, bilateral: Secondary | ICD-10-CM | POA: Diagnosis not present

## 2020-04-19 DIAGNOSIS — H18413 Arcus senilis, bilateral: Secondary | ICD-10-CM | POA: Diagnosis not present

## 2020-04-24 ENCOUNTER — Other Ambulatory Visit: Payer: Self-pay

## 2020-04-24 ENCOUNTER — Encounter: Payer: Self-pay | Admitting: Internal Medicine

## 2020-04-24 ENCOUNTER — Ambulatory Visit: Payer: Medicare Other | Admitting: Internal Medicine

## 2020-04-24 VITALS — BP 114/62 | HR 87 | Temp 97.3°F | Ht 64.5 in | Wt 158.2 lb

## 2020-04-24 DIAGNOSIS — G4736 Sleep related hypoventilation in conditions classified elsewhere: Secondary | ICD-10-CM

## 2020-04-24 DIAGNOSIS — J449 Chronic obstructive pulmonary disease, unspecified: Secondary | ICD-10-CM

## 2020-04-24 DIAGNOSIS — J439 Emphysema, unspecified: Secondary | ICD-10-CM | POA: Diagnosis not present

## 2020-04-24 MED ORDER — TRELEGY ELLIPTA 100-62.5-25 MCG/INH IN AEPB: INHALATION_SPRAY | RESPIRATORY_TRACT | 3 refills | Status: DC

## 2020-04-24 MED ORDER — ALBUTEROL SULFATE HFA 108 (90 BASE) MCG/ACT IN AERS: 1.0000 | INHALATION_SPRAY | Freq: Four times a day (QID) | RESPIRATORY_TRACT | 6 refills | Status: DC | PRN

## 2020-04-24 NOTE — Addendum Note (Signed)
Addended by: Coralie Keens on: 04/24/2020 11:29 AM   Modules accepted: Orders

## 2020-04-24 NOTE — Progress Notes (Signed)
IOV 02/11/2017  Chief Complaint  Patient presents with  . Advice Only    Self referral for COPD. States she was dx by Dr. Carol Ada x2 years ago. Has SOB with exertion and anxiety and has an occ. cough. Denies any CP.   80 year old female with limited smoking history and quit many years ago.  She tells me for the last few years she has had insidious onset of shortness of breath that is mild to moderate in severity brought on with exertion relieved by rest.  It is stable overall.  Does not much of associated cough except except very mild.  She does have associated ACE inhibitor intake.  The dyspnea is stable.  A few years ago based on pulmonary function test done?  At [facility] location but I do not have the results patient was started on Spiriva.  There is a chest x-ray in the system from few to several years ago that shows hyperinflation personally visualized and confirmed the findings.  Few to several months ago was switched to incruse/.  She honestly does not know if the symptoms are helping her.  She denies any wheezing orthopnea proximal nocturnal dyspnea hemoptysis fever chills edema.  Some 6 weeks ago she describes what sounds like an asthma or COPD exacerbation treated with antibiotics and prednisone x2 and then back to baseline currently.  At this point in time she wants pulmonary support for her diagnosis of COPD.  Her current symptoms are detailed below.  Blood work August 2018 normal hemoglobin A1c and TSH  OV 04/01/2017  Chief Complaint  Patient presents with  . Follow-up    PFT done today and HRCT done 02/11/17.  Pt states she has been doing good.  No real complaints of cough other than acid reflux.   Follow-up investigation for COPD.  Since her last visit she had CT scan of the chest that does show bronchiectasis associated with emphysema.  Pulmonary function test today shows Gold stage II COPD but with significant bronchodilator response.  She feels good and stable.   Cough is very minimal.  Med review shows that she is just on anticholinergic.  She is not on inhaled corticosteroid or bronchodilator.  I noticed that she is on ACE inhibitor.  She tells me that she can cough significantly after a viral infection.  However at baseline she only has mild cough.  She room was having asthma as a child but she says she outgrew it.  Incidental finding of 5 mm right middle lobe nodule in December 2018 CT chest  New issue of her having to undergo upper endoscopy by Dr. Oletta Lamas at [facility].  She has had colonoscopy a few years ago under propofol and she tolerated this fine.  She says gastroenterology is not aware of her COPD diagnosis and the severity of impairment of the lung function although she is quite functional and has tolerated sedation before.   OV 10/16/2017  Chief Complaint  Patient presents with  . Follow-up    Pt states she has been having SOB due to the heat. Denies any cough or CP.    Follow-up Gold stage III COPD: Overall stable since last visit. In fact COPD cat score is improved significantly. It is only for an very minimal symptoms. This is on trouble inhaler therapy. She checked her inhaler technique with me and it looked good.She does she had a bad July because of the heat and humidity but she stated endorse. She is  grieving because she lost her little chihuahua at age 21 - [name] was his name     Alpha 1 - 129 and MM 02/18/18   Walking desaturation test on 02/11/2017 185 feet x 3 laps on ROOM AIR:  did not desaturate. Rest pulse ox was 96%, final pulse ox was 95%. HR response 99/min at rest to 105/min at peak exertion. Patient Shelby Lin  Did not Desaturate < 88% . Cleophas Dunker Ancheta did not  Desaturated </= 3% points. Journe Hallmark [place] yes did get tachyardic   IMPRESSION: ct chest 2018 1. Minimal scattered cylindrical bronchiectasis with associated minimal tree-in-bud opacity in the medial upper lobes and medial right middle lobe. Findings could  be due to atypical mycobacterial infection (MAI) . 2. Mild-to-moderate centrilobular emphysema with mild diffuse bronchial wall thickening, compatible with the provided history of COPD. 3. Two small solid pulmonary nodules in the right lung, largest with average diameter 5 mm in the right middle lobe. No follow-up needed if patient is low-risk (and has no known or suspected primary neoplasm). Non-contrast chest CT can be considered in 12 months if patient is high-risk. This recommendation follows the consensus statement: Guidelines for Management of Incidental Pulmonary Nodules Detected on CT Images: From the Fleischner Society 2017; Radiology 2017; 284:228-243. 4. One vessel coronary atherosclerosis.  Aortic Atherosclerosis (ICD10-I70.0) and Emphysema (ICD10-J43.9).   Electronically Signed   By: Ilona Sorrel M.D.   On: 02/11/2017 16:30  OV 02/01/2018  Subjective:  [name], female , DOB: 1941/01/15 , age 78 y.o. , MRN: [id number] , ADDRESS: [place] [id number]   02/01/2018 -   Chief Complaint  Patient presents with  . Follow-up    Pt states that her SOB has improved some, CT review   Follow-up Gold stage III COPD [MM phenotype] postbronchodilator FEV1 0.96 L / 46% with 26% bronchodilator response and DLCO 12.8/52% in February 2019.  CT scan of the chest with mild scattered bronchiectasis.  HPI Shelby Lin 80 y.o. -returns for follow-up of Gold stage III COPD.  She is taking triple inhaler therapy in the form of Incruse and Breo.  Overall her symptoms are stable and well-controlled.  COPD CAT score is 7.  Not much cough some amount of shortness of breath no chest pain at all.  Doing really well overall.  Up-to-date with her vaccines.  She does tell me that primary care physician thought her carbon dioxide level on chemistry was high.  Review of her chart shows, dioxide level December 2017 was normal.  She is asking for Incruse samples because  she is in the donut hole.  However he did not have them.  We talked about starting Trelegy in January 2020 and she is open and willing to look into this option.  Lung nodules: She had CT chest December 2019.  Follow-up from 1 year ago.  Nodules are diminished.  New finding of coronary artery calcification on CT scan December 2019: One-vessel coronary artery calcification seen.  No chest pains.  No prior stress test history.  She says she will talk about it with her primary care physician rather than take a cardiology referral right now.     CAT COPD Symptom & Quality of Life Score (GSK trademark) 0 is no burden. 5 is highest burden 02/11/2017  10/16/2017  02/01/2018   Never Cough -> Cough all the time 1 0   No phlegm in chest -> Chest is full of phlegm 1  0   No chest tightness -> Chest feels very tight 3 0   No dyspnea for 1 flight stairs/hill -> Very dyspneic for 1 flight of stairs 4 1   No limitations for ADL at home -> Very limited with ADL at home 1 0   Confident leaving home -> Not at all confident leaving home 0 0   Sleep soundly -> Do not sleep soundly because of lung condition 3 0   Lots of Energy -> No energy at all 3 3   TOTAL Score (max 40)  16 4 7     IMPRESSION CT CHEST 1. Right middle lobe small solid pulmonary nodules are stable to decreased since 02/11/2017 chest CT, considered benign. No new significant pulmonary nodules. 2. Stable minimal scattered cylindrical bronchiectasis in medial upper lobes and medial right middle lobe. No evidence of active pulmonary infection. 3. One vessel coronary atherosclerosis. 4. Small hiatal hernia.  Aortic Atherosclerosis (ICD10-I70.0) and Emphysema (ICD10-J43.9).   Electronically Signed   By: Ilona Sorrel M.D.   On: 01/28/2018 12:22  ROS - per HPI   OV 04/24/2020  Subjective:  [name], female , DOB: December 04, 1940 , age 20 y.o. , MRN: [id number] , ADDRESS: [place] [address] PCP Carol Ada, MD Patient Care Team: Carol Ada, MD as PCP - General (Family Medicine) Jerline Pain, MD as PCP - Cardiology (Cardiology)  This Provider for this visit: Treatment Team:  Attending Provider: Lauraine Rinne, NP    04/24/2020 -   Chief Complaint  Patient presents with  . Follow-up    Doing well   Stage III COPD MM phenotype  HPI DAUN RENS 80 y.o. -returns for follow-up.  I personally last saw her in December 2019.  After that she is followed up with nurse practitioners with the pandemic.  A year ago January 2020 when she had COVID that was mild.  She continues on Trelegy.  She is rarely used albuterol.  She uses nighttime oxygen.  Her COPD CAT score is 4.  She is doing really well.  No chest pains no shortness of breath.  Last CT scan of the chest was in 2019.  Last PFT was also in 2019.  She does not smoke.  She is up-to-date with her vaccines.   CAT Score 04/24/2020 02/01/2018  Total CAT Score 4 7      PFT  PFT Results Latest Ref Rng & Units 04/01/2017  FVC-Pre L 1.69  FVC-Predicted Pre % 61  FVC-Post L 2.02  FVC-Predicted Post % 73  Pre FEV1/FVC % % 45  Post FEV1/FCV % % 48  FEV1-Pre L 0.76  FEV1-Predicted Pre % 36  FEV1-Post L 0.96  DLCO uncorrected ml/min/mmHg 12.81  DLCO UNC% % 52  DLVA Predicted % 84       has a past medical history of Anxiety, Arthritis, Asthma in child, Atherosclerotic heart disease of native coronary artery without angina pectoris, Cataracts, bilateral, Combined hyperlipidemia, Constipation, unspecified, COPD (chronic obstructive pulmonary disease) ([place]), Coronary artery spasm ([place]), Diabetic neuropathy ([place]), DM (diabetes mellitus) with complications ([place]), Dupuytren's contracture, Dyspepsia, Familial tremor, Fatigue, Fibromyalgia syndrome, GERD (gastroesophageal reflux disease), Hashimoto's thyroiditis, Hiatal hernia, History of colon polyps, Hashimoto thyroiditis, Hypercholesteremia, Hypertension, Hypothyroidism, acquired,  autoimmune, IBS (irritable bowel syndrome), Meniere's disease of left ear, Migraine without aura and without status migrainosus, not intractable, Multinodular goiter (nontoxic), Nonexudative macular degeneration, Raynaud's syndrome without gangrene, Situational stress, Tachycardia, Thyroiditis, autoimmune, Ulcerative colitis, unspecified, without complications (  [place]), Vitamin B 12 deficiency, Vitamin D deficiency, and Wears glasses.   reports that she quit smoking about 19 years ago. Her smoking use included cigarettes. She has a 21.00 pack-year smoking history. She has never used smokeless tobacco.  Past Surgical History:  Procedure Laterality Date  . BREAST EXCISIONAL BIOPSY Left    benign  . BREAST EXCISIONAL BIOPSY Right    milk gland removed  . BREAST LUMPECTOMY  1990   left  . BREAST LUMPECTOMY WITH NEEDLE LOCALIZATION Right 02/28/2013   Procedure: RIGHT BREAST NEEDLE LOCALIZATION LUMPECTOMY ;  Surgeon: Edward Jolly, MD;  Location: [facility];  Service: General;  Laterality: Right;  . CHOLECYSTECTOMY  2008   lapcholi  . DILATION AND CURETTAGE OF UTERUS    . LEFT HEART CATHETERIZATION WITH CORONARY ANGIOGRAM N/A 01/26/2013   Procedure: LEFT HEART CATHETERIZATION WITH CORONARY ANGIOGRAM;  Surgeon: Candee Furbish, MD;  Location: [facility] CATH LAB;  Service: Cardiovascular;  Laterality: N/A;  . PARTIAL HYSTERECTOMY  1971  . TONSILLECTOMY      Allergies  Allergen Reactions  . Acyclovir And Related   . Allegra [Fexofenadine] Hives  . Amoxicillin Hives  . Codeine Nausea And Vomiting  . Elemental Sulfur Hives  . Enalapril Maleate Cough  . Hydrocodone-Homatropine Other (See Comments)  . Keflex [Cephalexin] Hives  . Levofloxacin Other (See Comments)    insomnia  . Lorabid [Loracarbef] Hives  . Other Other (See Comments)  . Sulfa Antibiotics Hives    Immunization History  Administered Date(s) Administered  . Influenza Split 11/30/2007, 01/17/2015, 01/08/2016, 11/27/2017,  12/14/2018  . Influenza, High Dose Seasonal PF 12/01/2012, 12/05/2013, 12/12/2016, 11/27/2017, 12/14/2018  . Influenza-Unspecified 12/18/2011  . PFIZER(Purple Top)SARS-COV-2 Vaccination 05/05/2019, 05/26/2019, 12/12/2019  . Pneumococcal Conjugate-13 07/18/2015, 10/16/2017  . Pneumococcal Polysaccharide-23 10/23/2008, 10/20/2018  . Td 07/13/2007  . Tdap 07/27/2017  . Zoster 05/04/2014    Family History  Problem Relation Age of Onset  . Cancer Mother   . Hypertension Mother   . Stroke Mother   . Heart attack Father   . Sudden death Father   . Hypertension Sister   . Diabetes Sister   . Stroke Sister   . Cancer Maternal Uncle        breast,   . Cancer Paternal Aunt        breast, ovarian  . Thyroid disease Neg Hx      Current Outpatient Medications:  .  albuterol (VENTOLIN HFA) 108 (90 Base) MCG/ACT inhaler, Inhale 1-2 puffs into the lungs every 6 (six) hours as needed for wheezing or shortness of breath., Disp: 18 g, Rfl: 6 .  aspirin EC 81 MG tablet, Take 1 tablet (81 mg total) by mouth daily., Disp: , Rfl:  .  Cyanocobalamin (VITAMIN B 12 PO), Take by mouth daily., Disp: , Rfl:  .  DULoxetine (CYMBALTA) 30 MG capsule, Take 30 mg by mouth 2 (two) times daily., Disp: , Rfl:  .  Fluticasone-Umeclidin-Vilant (TRELEGY ELLIPTA) 100-62.5-25 MCG/INH AEPB, TAKE 1 PUFF BY MOUTH EVERY DAY, Disp: 180 each, Rfl: 3 .  furosemide (LASIX) 20 MG tablet, , Disp: , Rfl: 4 .  hydrochlorothiazide (HYDRODIURIL) 25 MG tablet, TAKE 1 TABLET BY MOUTH EVERY DAY IN THE MORNING, Disp: , Rfl: 1 .  LORazepam (ATIVAN) 0.5 MG tablet, TAKE 1/2 TAB EVERY 8 12 HOURS AS NEEDED FOR ANXIOUSNESS, Disp: , Rfl:  .  losartan (COZAAR) 100 MG tablet, Take 100 mg by mouth daily., Disp: , Rfl: 1 .  meclizine (ANTIVERT)  25 MG tablet, 1/2 to 1 tablet by mouth every 8 hrs as needed for diziness, Disp: , Rfl:  .  Multiple Vitamin (MULTIVITAMIN) tablet, Take 1 tablet by mouth daily., Disp: , Rfl:  .  Multiple  Vitamins-Minerals (PRESERVISION AREDS) TABS, See admin instructions., Disp: , Rfl:  .  omeprazole (PRILOSEC) 20 MG capsule, Take 20 mg by mouth daily., Disp: , Rfl:  .  Respiratory Therapy Supplies (FLUTTER) DEVI, Use device 2-3 times a day to break up congestion, Disp: 1 each, Rfl: 0 .  rosuvastatin (CRESTOR) 20 MG tablet, Take 1 tablet (20 mg total) by mouth daily., Disp: 90 tablet, Rfl: 3 .  SYNTHROID 88 MCG tablet, TAKE 1 TABLET (88 MCG TOTAL) BY MOUTH DAILY BEFORE BREAKFAST., Disp: 30 tablet, Rfl: 5 .  VITAMIN D, CHOLECALCIFEROL, PO, Take 50,000 Units by mouth once a week. , Disp: , Rfl:       Objective:   Vitals:   04/24/20 0946  BP: 114/62  Pulse: 87  Temp: (!) 97.3 F (36.3 C)  TempSrc: Oral  SpO2: 96%  Weight: 158 lb 3.2 oz (71.8 kg)  Height: 5' 4.5" (1.638 m)    Estimated body mass index is 26.74 kg/m as calculated from the following:   Height as of this encounter: 5' 4.5" (1.638 m).   Weight as of this encounter: 158 lb 3.2 oz (71.8 kg).  @WEIGHTCHANGE @  [employer]   04/24/20 0946  Weight: 158 lb 3.2 oz (71.8 kg)     Physical Exam   General: No distress. Looks well Neuro: Alert and Oriented x 3. GCS 15. Speech normal Psych: Pleasant Resp:  Barrel Chest - no.  Wheeze - no, Crackles - no, No overt respiratory distress CVS: Normal heart sounds. Murmurs - no Ext: Stigmata of Connective Tissue Disease - no HEENT: Normal upper airway. PEERL +. No post nasal drip        Assessment:       ICD-10-CM   1. Stage 3 severe COPD by GOLD classification ([place])  J44.9   2. Nocturnal hypoxemia due to emphysema (H[facility])  J43.9    G47.36        Plan:     Patient Instructions     ICD-10-CM   1. Stage 3 severe COPD by GOLD classification ([place])  J44.9   2. Nocturnal hypoxemia due to emphysema (H[facility])  J43.9    G47.36    Stable Gold stage III COPD Glad you are up-to-date with all your vaccines  Plan -Continue Trelegy daily [CMA to refill] -Continue albuterol  as needed [CMA to do refill) = Continue oxygen as needed -Do spirometry and DLCO in 9 months  Follow-up -Return in 9 months to see [name] but after completing breathing test [CAT score at follow-up]      SIGNATURE    Dr. Brand Males, M.D., F.C.C.P,  Pulmonary and Critical Care Medicine Staff Physician, [place] Director - Interstitial Lung Disease  Program  [facility] [place], [place], [address]  Pager: [phone], If no answer or between  15:00h - 7:00h: call [phone] Telephone: [phone]  10:00 AM 04/24/2020

## 2020-04-24 NOTE — Patient Instructions (Signed)
ICD-10-CM   1. Stage 3 severe COPD by GOLD classification ([place])  J44.9   2. Nocturnal hypoxemia due to emphysema (HCC)  J43.9    G47.36    Stable Gold stage III COPD Glad you are up-to-date with all your vaccines  Plan -Continue Trelegy daily [CMA to refill] -Continue albuterol as needed [CMA to do refill) = Continue oxygen as needed -Do spirometry and DLCO in 9 months  Follow-up -Return in 9 months to see [name] but after completing breathing test [CAT score at follow-up]

## 2020-04-24 NOTE — Addendum Note (Signed)
Addended byCoralie Keens on: 04/24/2020 10:02 AM   Modules accepted: Orders

## 2020-04-24 NOTE — Addendum Note (Signed)
Addended byCoralie Keens on: 04/24/2020 11:28 AM   Modules accepted: Orders

## 2020-04-27 ENCOUNTER — Other Ambulatory Visit: Payer: Self-pay | Admitting: *Deleted

## 2020-04-27 DIAGNOSIS — J449 Chronic obstructive pulmonary disease, unspecified: Secondary | ICD-10-CM

## 2020-04-27 MED ORDER — ALBUTEROL SULFATE HFA 108 (90 BASE) MCG/ACT IN AERS: 1.0000 | INHALATION_SPRAY | Freq: Four times a day (QID) | RESPIRATORY_TRACT | 6 refills | Status: DC | PRN

## 2020-05-03 DIAGNOSIS — J449 Chronic obstructive pulmonary disease, unspecified: Secondary | ICD-10-CM | POA: Diagnosis not present

## 2020-05-07 DIAGNOSIS — G8929 Other chronic pain: Secondary | ICD-10-CM | POA: Diagnosis not present

## 2020-05-07 DIAGNOSIS — E1140 Type 2 diabetes mellitus with diabetic neuropathy, unspecified: Secondary | ICD-10-CM | POA: Diagnosis not present

## 2020-05-07 DIAGNOSIS — I2510 Atherosclerotic heart disease of native coronary artery without angina pectoris: Secondary | ICD-10-CM | POA: Diagnosis not present

## 2020-05-07 DIAGNOSIS — E039 Hypothyroidism, unspecified: Secondary | ICD-10-CM | POA: Diagnosis not present

## 2020-05-07 DIAGNOSIS — E1169 Type 2 diabetes mellitus with other specified complication: Secondary | ICD-10-CM | POA: Diagnosis not present

## 2020-05-07 DIAGNOSIS — E118 Type 2 diabetes mellitus with unspecified complications: Secondary | ICD-10-CM | POA: Diagnosis not present

## 2020-05-07 DIAGNOSIS — J449 Chronic obstructive pulmonary disease, unspecified: Secondary | ICD-10-CM | POA: Diagnosis not present

## 2020-05-07 DIAGNOSIS — E7800 Pure hypercholesterolemia, unspecified: Secondary | ICD-10-CM | POA: Diagnosis not present

## 2020-05-07 DIAGNOSIS — K219 Gastro-esophageal reflux disease without esophagitis: Secondary | ICD-10-CM | POA: Diagnosis not present

## 2020-05-07 DIAGNOSIS — I10 Essential (primary) hypertension: Secondary | ICD-10-CM | POA: Diagnosis not present

## 2020-05-07 DIAGNOSIS — G43009 Migraine without aura, not intractable, without status migrainosus: Secondary | ICD-10-CM | POA: Diagnosis not present

## 2020-05-16 DIAGNOSIS — H25092 Other age-related incipient cataract, left eye: Secondary | ICD-10-CM | POA: Diagnosis not present

## 2020-05-16 DIAGNOSIS — H2512 Age-related nuclear cataract, left eye: Secondary | ICD-10-CM | POA: Diagnosis not present

## 2020-05-17 DIAGNOSIS — H2511 Age-related nuclear cataract, right eye: Secondary | ICD-10-CM | POA: Diagnosis not present

## 2020-05-28 ENCOUNTER — Other Ambulatory Visit: Payer: Self-pay

## 2020-05-28 ENCOUNTER — Encounter: Payer: Self-pay | Admitting: Cardiology

## 2020-05-28 ENCOUNTER — Encounter: Payer: Self-pay | Admitting: *Deleted

## 2020-05-28 ENCOUNTER — Ambulatory Visit: Payer: Medicare Other | Admitting: Cardiology

## 2020-05-28 VITALS — BP 110/80 | HR 83 | Ht 64.5 in | Wt 158.8 lb

## 2020-05-28 DIAGNOSIS — I2584 Coronary atherosclerosis due to calcified coronary lesion: Secondary | ICD-10-CM

## 2020-05-28 DIAGNOSIS — K219 Gastro-esophageal reflux disease without esophagitis: Secondary | ICD-10-CM

## 2020-05-28 DIAGNOSIS — R079 Chest pain, unspecified: Secondary | ICD-10-CM | POA: Diagnosis not present

## 2020-05-28 DIAGNOSIS — I2510 Atherosclerotic heart disease of native coronary artery without angina pectoris: Secondary | ICD-10-CM

## 2020-05-28 NOTE — Progress Notes (Signed)
Cardiology Office Note:    Date:  05/28/2020   ID:  Shelby Lin, DOB Aug 28, 1940, MRN [id number]  PCP:  Carol Ada, [place]  Cardiologist:  Candee Furbish, MD  Advanced Practice Provider:  No care team member to display Electrophysiologist:  None       Referring MD: Carol Ada, MD     History of Present Illness:    Shelby Lin is a 80 y.o. female here for the follow-up of coronary artery calcification.  Has COPD followed by pulmonary.  Heart catheterization 2014 showed no significant flow-limiting CAD.  At the time she was having spells of jaw pain relieved with Tums.  Her father had GERD and ended up having CAD and dying suddenly of a heart attack.  She also has a history of breast cancer.    Past Medical History:  Diagnosis Date  . Anxiety   . Arthritis   . Asthma in child   . Atherosclerotic heart disease of native coronary artery without angina pectoris   . Cataracts, bilateral   . Combined hyperlipidemia   . Constipation, unspecified   . COPD (chronic obstructive pulmonary disease) ([place])   . Coronary artery spasm ([place])   . Diabetic neuropathy ([place])   . DM (diabetes mellitus) with complications ([place])   . Dupuytren's contracture   . Dyspepsia   . Familial tremor   . Fatigue   . Fibromyalgia syndrome   . GERD (gastroesophageal reflux disease)   . Hashimoto's thyroiditis   . Hiatal hernia   . History of colon polyps   . Hx of Hashimoto thyroiditis   . Hypercholesteremia   . Hypertension   . Hypothyroidism, acquired, autoimmune   . IBS (irritable bowel syndrome)   . Meniere's disease of left ear   . Migraine without aura and without status migrainosus, not intractable   . Multinodular goiter (nontoxic)   . Nonexudative macular degeneration   . Raynaud's syndrome without gangrene   . Situational stress   . Tachycardia   . Thyroiditis, autoimmune   . Ulcerative colitis, unspecified, without complications ([place])   .  Vitamin B 12 deficiency   . Vitamin D deficiency   . Wears glasses     Past Surgical History:  Procedure Laterality Date  . BREAST EXCISIONAL BIOPSY Left    benign  . BREAST EXCISIONAL BIOPSY Right    milk gland removed  . BREAST LUMPECTOMY  1990   left  . BREAST LUMPECTOMY WITH NEEDLE LOCALIZATION Right 02/28/2013   Procedure: RIGHT BREAST NEEDLE LOCALIZATION LUMPECTOMY ;  Surgeon: Edward Jolly, MD;  Location: [facility];  Service: General;  Laterality: Right;  . CHOLECYSTECTOMY  2008   lapcholi  . DILATION AND CURETTAGE OF UTERUS    . LEFT HEART CATHETERIZATION WITH CORONARY ANGIOGRAM N/A 01/26/2013   Procedure: LEFT HEART CATHETERIZATION WITH CORONARY ANGIOGRAM;  Surgeon: Candee Furbish, MD;  Location: [facility] CATH LAB;  Service: Cardiovascular;  Laterality: N/A;  . PARTIAL HYSTERECTOMY  1971  . TONSILLECTOMY      Current Medications: Current Meds  Medication Sig  . albuterol (VENTOLIN HFA) 108 (90 Base) MCG/ACT inhaler Inhale 1-2 puffs into the lungs every 6 (six) hours as needed for wheezing or shortness of breath.  Marland Kitchen aspirin EC 81 MG tablet Take 1 tablet (81 mg total) by mouth daily.  Marland Kitchen BESIVANCE 0.6 % SUSP Place 1 drop into the right eye 3 (three) times daily.  . Cyanocobalamin (VITAMIN B  12 PO) Take by mouth daily.  . DULoxetine (CYMBALTA) 30 MG capsule Take 30 mg by mouth 2 (two) times daily.  . Fluticasone-Umeclidin-Vilant (TRELEGY ELLIPTA) 100-62.5-25 MCG/INH AEPB TAKE 1 PUFF BY MOUTH EVERY DAY  . furosemide (LASIX) 20 MG tablet Take 20 mg by mouth as needed.  . hydrochlorothiazide (HYDRODIURIL) 25 MG tablet TAKE 1 TABLET BY MOUTH EVERY DAY IN THE MORNING  . LORazepam (ATIVAN) 0.5 MG tablet TAKE 1/2 TAB EVERY 8 12 HOURS AS NEEDED FOR ANXIOUSNESS  . losartan (COZAAR) 100 MG tablet Take 100 mg by mouth daily.  . meclizine (ANTIVERT) 25 MG tablet 1/2 to 1 tablet by mouth every 8 hrs as needed for diziness  . Multiple Vitamin (MULTIVITAMIN) tablet Take 1 tablet  by mouth daily.  . Multiple Vitamins-Minerals (PRESERVISION AREDS) TABS See admin instructions.  Marland Kitchen omeprazole (PRILOSEC) 20 MG capsule Take 20 mg by mouth daily.  . prednisoLONE acetate (PRED FORTE) 1 % ophthalmic suspension Place 1 drop into the right eye 4 (four) times daily.  Marland Kitchen PROLENSA 0.07 % SOLN Place 1 drop into the right eye at bedtime.  Marland Kitchen Respiratory Therapy Supplies (FLUTTER) DEVI Use device 2-3 times a day to break up congestion  . rosuvastatin (CRESTOR) 20 MG tablet Take 1 tablet (20 mg total) by mouth daily.  Marland Kitchen SYNTHROID 88 MCG tablet TAKE 1 TABLET (88 MCG TOTAL) BY MOUTH DAILY BEFORE BREAKFAST.  Marland Kitchen VITAMIN D, CHOLECALCIFEROL, PO Take 50,000 Units by mouth once a week.      Allergies:   Acyclovir and related, Allegra [fexofenadine], Amoxicillin, Codeine, Elemental sulfur, Enalapril maleate, Hydrocodone-homatropine, Keflex [cephalexin], Levofloxacin, Lorabid [loracarbef], Other, and Sulfa antibiotics   Social History   Socioeconomic History  . Marital status: Married    Spouse name: edward  . Number of children: 2  . Years of education: 12th  . Highest education level: Not on file  Occupational History    Employer: [employer]  Tobacco Use  . Smoking status: Former Smoker    Packs/day: 0.50    Years: 42.00    Pack years: 21.00    Types: Cigarettes    Quit date: 02/21/2001    Years since quitting: 19.2  . Smokeless tobacco: Never Used  Vaping Use  . Vaping Use: Never used  Substance and Sexual Activity  . Alcohol use: No  . Drug use: No  . Sexual activity: Not on file  Other Topics Concern  . Not on file  Social History Narrative   Patient lives at home with the husband.. Patient drinks coffee   Social Determinants of Health   Financial Resource Strain: Not on file  Food Insecurity: Not on file  Transportation Needs: Not on file  Physical Activity: Not on file  Stress: Not on file  Social Connections: Not on file     Family History: The  patient's family history includes Cancer in her maternal uncle, mother, and paternal aunt; Diabetes in her sister; Heart attack in her father; Hypertension in her mother and sister; Stroke in her mother and sister; Sudden death in her father. There is no history of Thyroid disease.  ROS:   Please see the history of present illness.     All other systems reviewed and are negative.  EKGs/Labs/Other Studies Reviewed:    The following studies were reviewed today:  Cardiac catheterization 01/26/2013-no angiographically significant CAD, normal EF, left ventricular end-diastolic pressure 14.    EKG:  EKG is  ordered today.  The ekg ordered today demonstrates  sinus rhythm 83 with nonspecific ST-T wave changes  Recent Labs: 01/10/2020: TSH 0.80  Recent Lipid Panel    Component Value Date/Time   CHOL 134 08/31/2018 0919   TRIG 103 08/31/2018 0919   HDL 72 08/31/2018 0919   CHOLHDL 1.9 08/31/2018 0919   LDLCALC 41 08/31/2018 0919     Risk Assessment/Calculations:       Physical Exam:    VS:  BP 110/80   Pulse 83   Ht 5' 4.5" (1.638 m)   Wt 158 lb 12.8 oz (72 kg)   SpO2 97%   BMI 26.84 kg/m     Wt Readings from Last 3 Encounters:  05/28/20 158 lb 12.8 oz (72 kg)  04/24/20 158 lb 3.2 oz (71.8 kg)  04/17/20 158 lb 3.2 oz (71.8 kg)     GEN:  Well nourished, well developed in no acute distress HEENT: Normal NECK: No JVD; No carotid bruits LYMPHATICS: No lymphadenopathy CARDIAC: RRR, no murmurs, rubs, gallops RESPIRATORY:  Clear to auscultation without rales, wheezing or rhonchi  ABDOMEN: Soft, non-tender, non-distended MUSCULOSKELETAL:  No edema; No deformity  SKIN: Warm and dry NEUROLOGIC:  Alert and oriented x 3 PSYCHIATRIC:  Normal affect   ASSESSMENT:    1. Atherosclerosis of native coronary artery of native heart without angina pectoris   2. Coronary artery calcification   3. Gastroesophageal reflux disease without esophagitis   4. Chest pain, unspecified type     PLAN:    In order of problems listed above:  Chest discomfort/GERD -Still concerned that her father had a massive myocardial infarction, his symptoms used to be GERD.  She does have indigestion type symptoms, heartburn running up her midline chest.  This is concerning to her again.  We will go ahead and check a pharmacologic stress test to ensure that there is no evidence of high risk ischemia.  Once again cardiac catheterization 2014 showed no flow-limiting CAD.  She does have coronary calcification however.  We will continue with high intensity statin, excellent LDL in the 40s.  Coronary artery calcification/aortic atherosclerosis -Mid LAD calcification noted with prior cardiac catheterization in 2014 showing no obstructive CAD. -Previously I intensified her rosuvastatin from 5-20.  Started aspirin 81 mg.  Also has family history of coronary artery disease -Sister had CABG stroke, mother had angina and A. fib.  Father had GERD which subsequently was diagnosed as heart disease and had sudden death.  COPD -She quit smoking in 2002 after 21 pack years.  Uses 2 L at night.  73yr  Shared Decision Making/Informed Consent The risks [chest pain, shortness of breath, cardiac arrhythmias, dizziness, blood pressure fluctuations, myocardial infarction, stroke/transient ischemic attack, nausea, vomiting, allergic reaction, radiation exposure, metallic taste sensation and life-threatening complications (estimated to be 1 in 10,000)], benefits (risk stratification, diagnosing coronary artery disease, treatment guidance) and alternatives of a nuclear stress test were discussed in detail with Ms. Brackins and she agrees to proceed.       Medication Adjustments/Labs and Tests Ordered: Current medicines are reviewed at length with the patient today.  Concerns regarding medicines are outlined above.  Orders Placed This Encounter  Procedures  . Cardiac Stress Test: Informed Consent Details:  Physician/Practitioner Attestation; Transcribe to consent form and obtain patient signature  . MYOCARDIAL PERFUSION IMAGING  . EKG 12-Lead   No orders of the defined types were placed in this encounter.   Patient Instructions  Medication Instructions:  Your physician recommends that you continue on your current medications as directed. Please  refer to the Current Medication list given to you today.  *If you need a refill on your cardiac medications before your next appointment, please call your pharmacy*   Lab Work: none If you have labs (blood work) drawn today and your tests are completely normal, you will receive your results only by: Marland Kitchen MyChart Message (if you have MyChart) OR . A paper copy in the mail If you have any lab test that is abnormal or we need to change your treatment, we will call you to review the results.   Testing/Procedures: Your physician has requested that you have a lexiscan myoview. For further information please visit [email or web address]. Please follow instruction sheet, as given.     Follow-Up: At [facility], you and your health needs are our priority.  As part of our continuing mission to provide you with exceptional heart care, we have created designated Provider Care Teams.  These Care Teams include your primary Cardiologist (physician) and Advanced Practice Providers (APPs -  Physician Assistants and Nurse Practitioners) who all work together to provide you with the care you need, when you need it.  We recommend signing up for the patient portal called "MyChart".  Sign up information is provided on this After Visit Summary.  MyChart is used to connect with patients for Virtual Visits (Telemedicine).  Patients are able to view lab/test results, encounter notes, upcoming appointments, etc.  Non-urgent messages can be sent to your provider as well.   To learn more about what you can do with MyChart, go to [email or web address].    Your next  appointment:   12 month(s)  The format for your next appointment:   In Person  Provider:   You may see Candee Furbish, MD or one of the following Advanced Practice Providers on your designated Care Team:    Kathyrn Drown, NP    Other Instructions      Signed, Candee Furbish, MD  05/28/2020 10:29 AM    [place]

## 2020-05-28 NOTE — Patient Instructions (Signed)
Medication Instructions:  Your physician recommends that you continue on your current medications as directed. Please refer to the Current Medication list given to you today.  *If you need a refill on your cardiac medications before your next appointment, please call your pharmacy*   Lab Work: none If you have labs (blood work) drawn today and your tests are completely normal, you will receive your results only by: Marland Kitchen MyChart Message (if you have MyChart) OR . A paper copy in the mail If you have any lab test that is abnormal or we need to change your treatment, we will call you to review the results.   Testing/Procedures: Your physician has requested that you have a lexiscan myoview. For further information please visit [email or web address]. Please follow instruction sheet, as given.     Follow-Up: At [facility], you and your health needs are our priority.  As part of our continuing mission to provide you with exceptional heart care, we have created designated Provider Care Teams.  These Care Teams include your primary Cardiologist (physician) and Advanced Practice Providers (APPs -  Physician Assistants and Nurse Practitioners) who all work together to provide you with the care you need, when you need it.  We recommend signing up for the patient portal called "MyChart".  Sign up information is provided on this After Visit Summary.  MyChart is used to connect with patients for Virtual Visits (Telemedicine).  Patients are able to view lab/test results, encounter notes, upcoming appointments, etc.  Non-urgent messages can be sent to your provider as well.   To learn more about what you can do with MyChart, go to [email or web address].    Your next appointment:   12 month(s)  The format for your next appointment:   In Person  Provider:   You may see Candee Furbish, MD or one of the following Advanced Practice Providers on your designated Care Team:    Kathyrn Drown, NP    Other  Instructions

## 2020-06-03 DIAGNOSIS — J449 Chronic obstructive pulmonary disease, unspecified: Secondary | ICD-10-CM | POA: Diagnosis not present

## 2020-06-06 ENCOUNTER — Telehealth: Payer: Self-pay

## 2020-06-06 DIAGNOSIS — H2511 Age-related nuclear cataract, right eye: Secondary | ICD-10-CM | POA: Diagnosis not present

## 2020-06-06 NOTE — Telephone Encounter (Signed)
Detailed instructions left on the patient's answering machine. Asked to call back with any questions. S.Shatoya Roets EMTP 

## 2020-06-12 ENCOUNTER — Other Ambulatory Visit: Payer: Self-pay

## 2020-06-12 ENCOUNTER — Ambulatory Visit (HOSPITAL_COMMUNITY): Payer: Medicare Other | Attending: Internal Medicine

## 2020-06-12 DIAGNOSIS — R079 Chest pain, unspecified: Secondary | ICD-10-CM | POA: Diagnosis not present

## 2020-06-12 LAB — MYOCARDIAL PERFUSION IMAGING
LV dias vol: 39 mL (ref 46–106)
LV sys vol: 8 mL
Peak HR: 100 {beats}/min
Rest HR: 75 {beats}/min
SDS: 3
SRS: 0
SSS: 3
TID: 1.05

## 2020-06-12 MED ORDER — REGADENOSON 0.4 MG/5ML IV SOLN
0.4000 mg | Freq: Once | INTRAVENOUS | Status: AC
  Administered 2020-06-12: 0.4 mg via INTRAVENOUS

## 2020-06-12 MED ORDER — TECHNETIUM TC 99M TETROFOSMIN IV KIT
30.4000 | PACK | Freq: Once | INTRAVENOUS | Status: AC | PRN
Start: 2020-06-12 — End: 2020-06-12
  Administered 2020-06-12: 30.4 via INTRAVENOUS
  Filled 2020-06-12: qty 31

## 2020-06-12 MED ORDER — TECHNETIUM TC 99M TETROFOSMIN IV KIT
10.2000 | PACK | Freq: Once | INTRAVENOUS | Status: AC | PRN
Start: 2020-06-12 — End: 2020-06-12
  Administered 2020-06-12: 10.2 via INTRAVENOUS
  Filled 2020-06-12: qty 11

## 2020-06-13 ENCOUNTER — Telehealth: Payer: Self-pay | Admitting: Cardiology

## 2020-06-13 NOTE — Telephone Encounter (Signed)
The patient has been notified of the stress test result and verbalized understanding.  All questions (if any) were answered. Darrell Jewel, RN 06/13/2020 11:09 AM

## 2020-06-13 NOTE — Telephone Encounter (Signed)
Patient returning call for stress test results. 

## 2020-06-25 ENCOUNTER — Other Ambulatory Visit: Payer: Self-pay | Admitting: *Deleted

## 2020-06-25 MED ORDER — TRELEGY ELLIPTA 100-62.5-25 MCG/INH IN AEPB: 1.0000 | INHALATION_SPRAY | Freq: Every day | RESPIRATORY_TRACT | 3 refills | Status: DC

## 2020-07-03 DIAGNOSIS — J449 Chronic obstructive pulmonary disease, unspecified: Secondary | ICD-10-CM | POA: Diagnosis not present

## 2020-07-05 DIAGNOSIS — Z961 Presence of intraocular lens: Secondary | ICD-10-CM | POA: Diagnosis not present

## 2020-07-11 ENCOUNTER — Other Ambulatory Visit (INDEPENDENT_AMBULATORY_CARE_PROVIDER_SITE_OTHER): Payer: Self-pay | Admitting: "Endocrinology

## 2020-07-11 DIAGNOSIS — E034 Atrophy of thyroid (acquired): Secondary | ICD-10-CM

## 2020-07-12 ENCOUNTER — Telehealth: Payer: Self-pay | Admitting: Internal Medicine

## 2020-07-12 NOTE — Telephone Encounter (Signed)
ATC, no answer, left VM 

## 2020-07-12 NOTE — Telephone Encounter (Signed)
Paperwork was faxed to [facility] after Rx was signed by MR. The application was sent to scan. Application is able to be seen under media tab of pt's chart.

## 2020-07-15 IMAGING — CT CT CHEST W/O CM
2 of 3 series · 15 of 36 positions shown, 18 images · non-contrast
Comparison: 02/11/2017 high-resolution chest CT.

CLINICAL DATA: Follow-up pulmonary nodules.

EXAM:
CT CHEST WITHOUT CONTRAST
TECHNIQUE: Multidetector CT imaging of the chest was performed following the
standard protocol without IV contrast.

[Series 2: thorax · axial · 0.67mm/px · z∈[-284,-34]mm · 12 of 147 slices shown, 15 images]
[im 11/147  mediastinal]
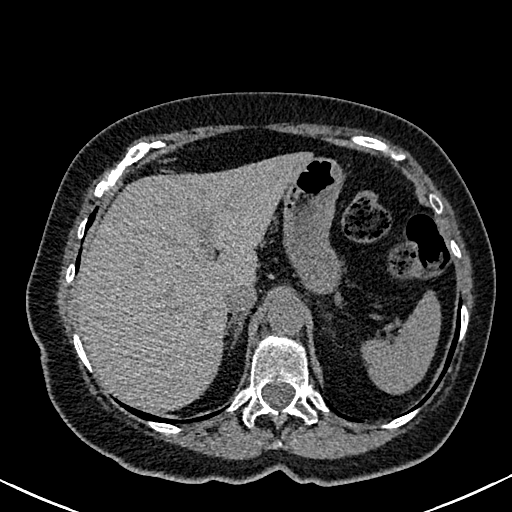
[im 11/147  lung]
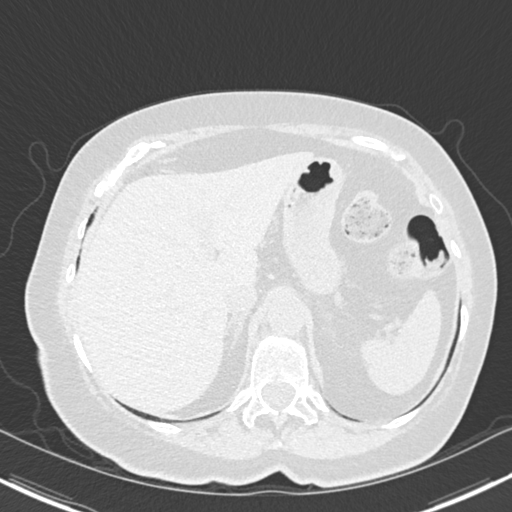
[im 22/147  lung]
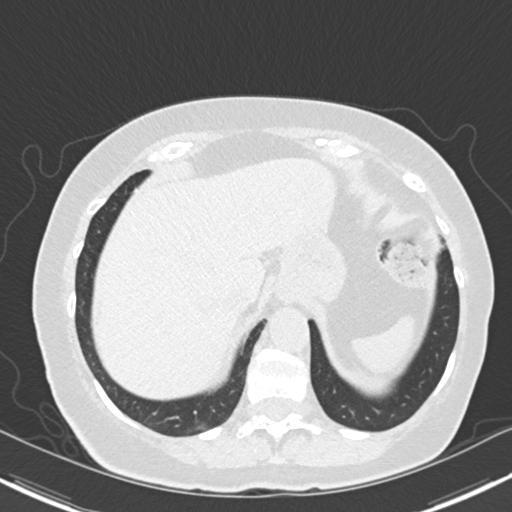
[im 33/147  lung]
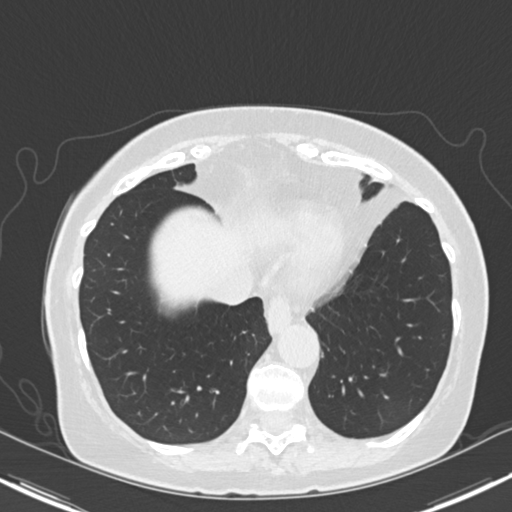
[im 44/147  lung]
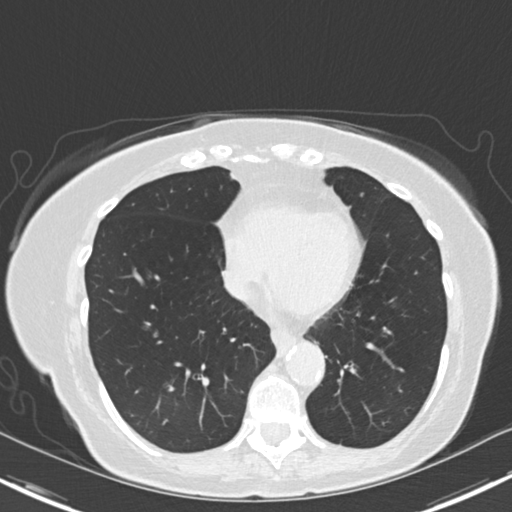
[im 55/147  mediastinal]
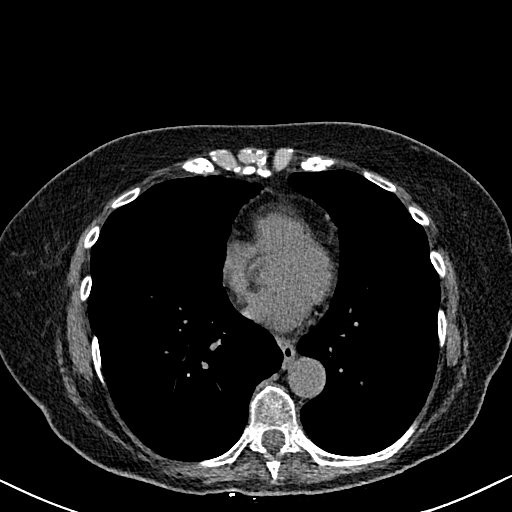
[im 55/147  lung]
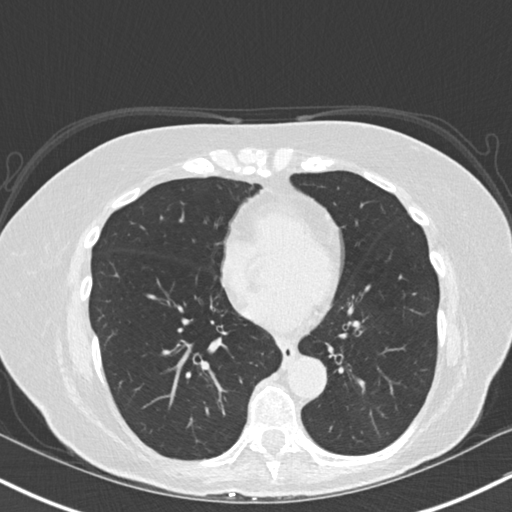
[im 65/147  lung]
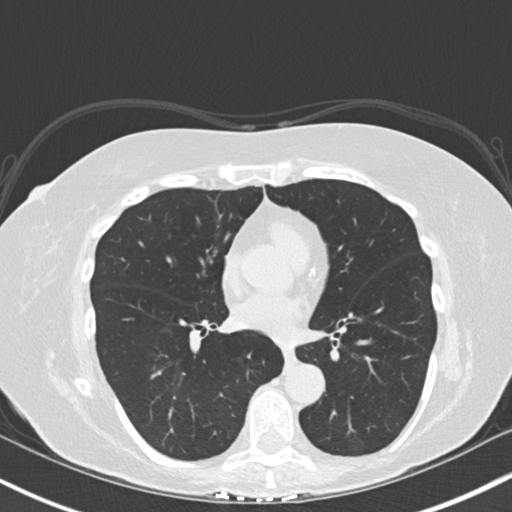
[im 82/147  lung]
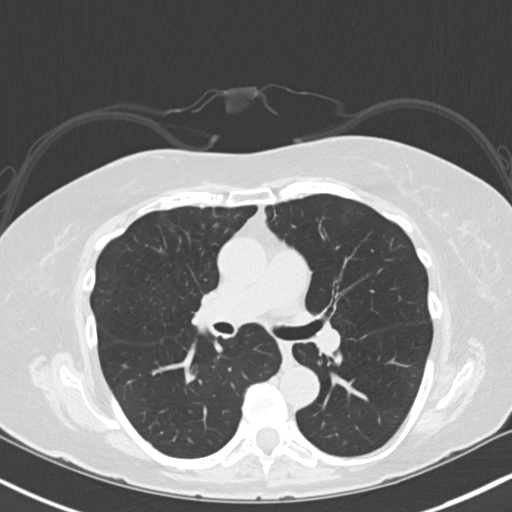
[im 92/147  lung]
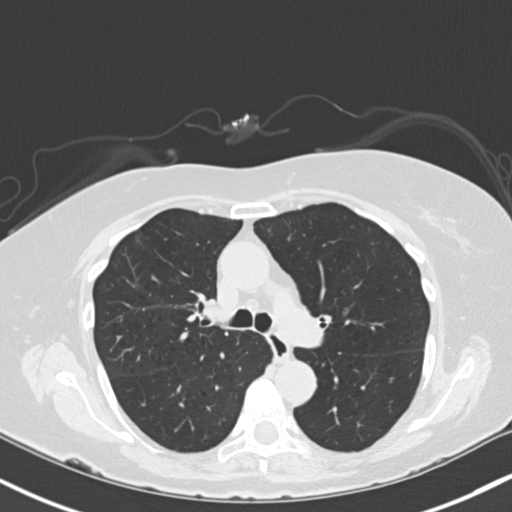
[im 103/147  mediastinal]
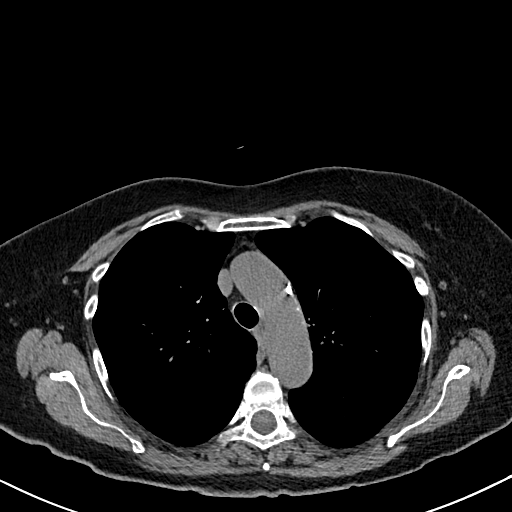
[im 103/147  lung]
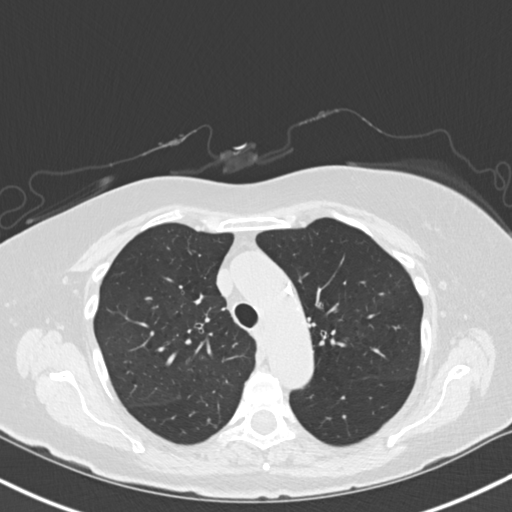
[im 114/147  lung]
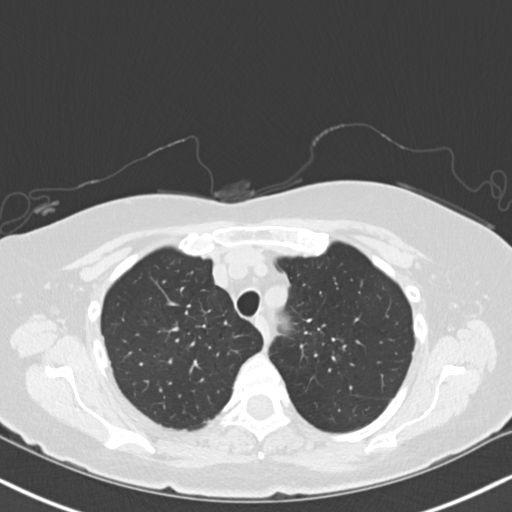
[im 125/147  lung]
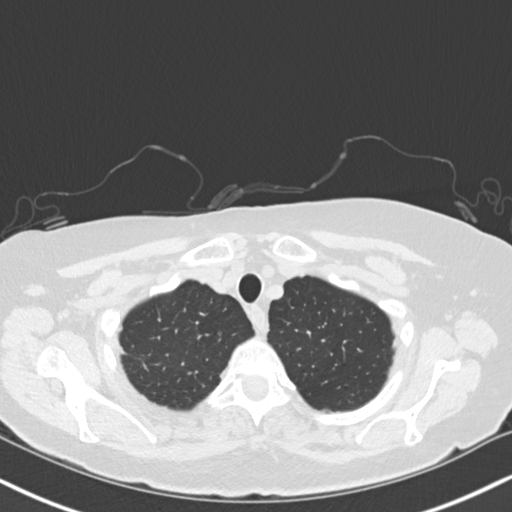
[im 136/147  lung]
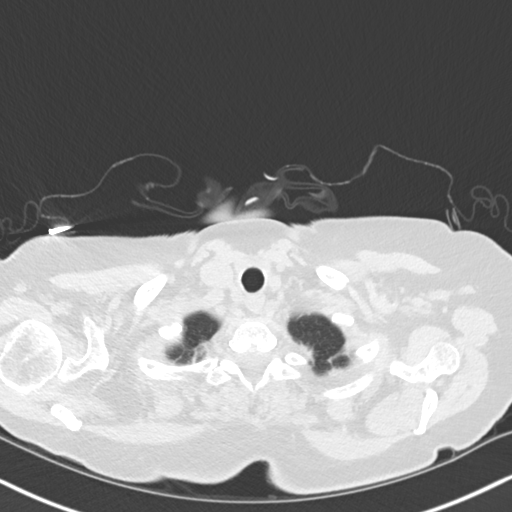

[Series 5: coronal · coronal · 0.60mm/px · 3 of 139 slices shown]
[im 28/139  lung]
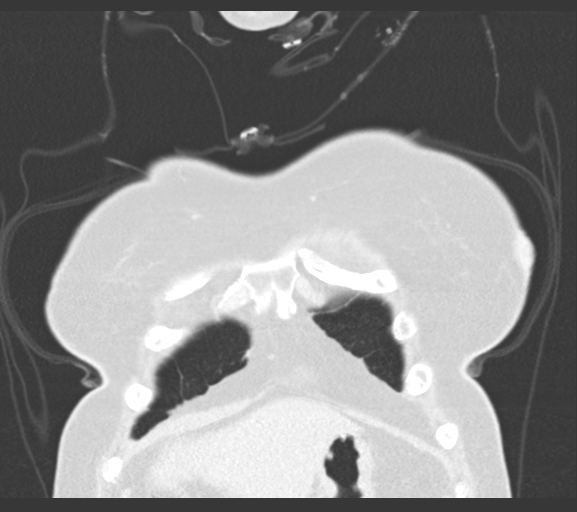
[im 56/139  lung]
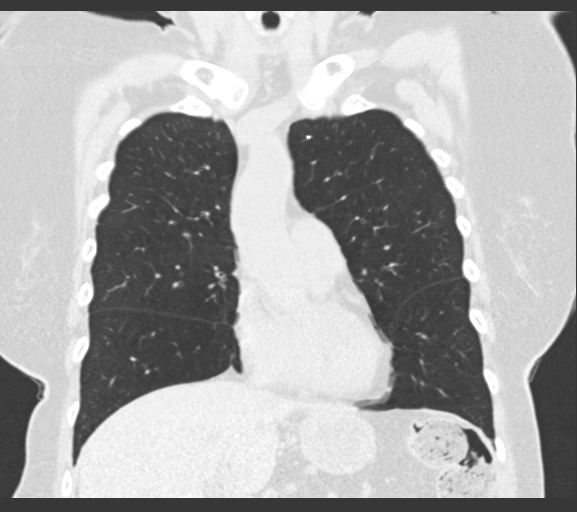
[im 83/139  lung]
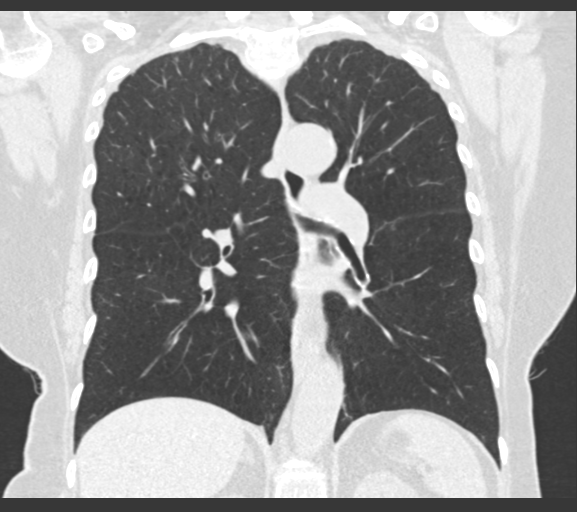

[15 of 36 positions shown; findings below may reference images not displayed]

FINDINGS: Cardiovascular: Normal heart size. No significant pericardial
effusion/thickening. Left anterior descending coronary
atherosclerosis. Atherosclerotic nonaneurysmal thoracic aorta.
Normal caliber pulmonary arteries.

Mediastinum/Nodes: No discrete thyroid nodules. Unremarkable
esophagus. No pathologically enlarged axillary, mediastinal or hilar
lymph nodes, noting limited sensitivity for the detection of hilar
adenopathy on this noncontrast study.

Lungs/Pleura: No pneumothorax. No pleural effusion. Mild-to-moderate
centrilobular emphysema with diffuse bronchial wall thickening. No
acute consolidative airspace disease or lung masses. Right middle
lobe solid 8 x 2 mm (average diameter 5 mm) pulmonary nodule (series
3/image 72) is stable since 02/11/2017 chest CT, considered benign.
Separate 2 mm solid right middle lobe pulmonary nodule (series
3/image 69) is decreased from 4 mm, considered benign. Minimal
scattered cylindrical bronchiectasis in medial right middle lobe and
medial bilateral upper lobes is unchanged. No significant associated
tree-in-bud opacities. No new significant pulmonary nodules.

Upper abdomen: Small hiatal hernia.  Cholecystectomy.

Musculoskeletal: No aggressive appearing focal osseous lesions. Mild
thoracic spondylosis.
IMPRESSION: 1. Right middle lobe small solid pulmonary nodules are stable to
decreased since 02/11/2017 chest CT, considered benign. No new
significant pulmonary nodules.
2. Stable minimal scattered cylindrical bronchiectasis in medial
upper lobes and medial right middle lobe. No evidence of active
pulmonary infection.
3. One vessel coronary atherosclerosis.
4. Small hiatal hernia.

Aortic Atherosclerosis (1EYT6-PQ6.6) and Emphysema (1EYT6-5YH.D).

## 2020-07-17 NOTE — Telephone Encounter (Signed)
Lmtcb for pt.  

## 2020-07-18 DIAGNOSIS — K5900 Constipation, unspecified: Secondary | ICD-10-CM | POA: Diagnosis not present

## 2020-07-18 NOTE — Telephone Encounter (Signed)
Called and left detailed message for pt that the forms have been faxed to [facility] and to call back with any issues or questions. Will close encounter.

## 2020-07-24 DIAGNOSIS — E1169 Type 2 diabetes mellitus with other specified complication: Secondary | ICD-10-CM | POA: Diagnosis not present

## 2020-07-24 DIAGNOSIS — I2510 Atherosclerotic heart disease of native coronary artery without angina pectoris: Secondary | ICD-10-CM | POA: Diagnosis not present

## 2020-07-24 DIAGNOSIS — E7800 Pure hypercholesterolemia, unspecified: Secondary | ICD-10-CM | POA: Diagnosis not present

## 2020-07-24 DIAGNOSIS — E118 Type 2 diabetes mellitus with unspecified complications: Secondary | ICD-10-CM | POA: Diagnosis not present

## 2020-07-24 DIAGNOSIS — G43009 Migraine without aura, not intractable, without status migrainosus: Secondary | ICD-10-CM | POA: Diagnosis not present

## 2020-07-24 DIAGNOSIS — K219 Gastro-esophageal reflux disease without esophagitis: Secondary | ICD-10-CM | POA: Diagnosis not present

## 2020-07-24 DIAGNOSIS — J449 Chronic obstructive pulmonary disease, unspecified: Secondary | ICD-10-CM | POA: Diagnosis not present

## 2020-07-24 DIAGNOSIS — E1140 Type 2 diabetes mellitus with diabetic neuropathy, unspecified: Secondary | ICD-10-CM | POA: Diagnosis not present

## 2020-07-24 DIAGNOSIS — E039 Hypothyroidism, unspecified: Secondary | ICD-10-CM | POA: Diagnosis not present

## 2020-07-24 DIAGNOSIS — I10 Essential (primary) hypertension: Secondary | ICD-10-CM | POA: Diagnosis not present

## 2020-07-24 DIAGNOSIS — G8929 Other chronic pain: Secondary | ICD-10-CM | POA: Diagnosis not present

## 2020-08-03 DIAGNOSIS — J449 Chronic obstructive pulmonary disease, unspecified: Secondary | ICD-10-CM | POA: Diagnosis not present

## 2020-08-10 DIAGNOSIS — E559 Vitamin D deficiency, unspecified: Secondary | ICD-10-CM | POA: Diagnosis not present

## 2020-08-10 DIAGNOSIS — E063 Autoimmune thyroiditis: Secondary | ICD-10-CM | POA: Diagnosis not present

## 2020-08-13 LAB — T4, FREE: Free T4: 1.3 ng/dL (ref 0.8–1.8)

## 2020-08-13 LAB — PTH, INTACT AND CALCIUM
Calcium: 9.7 mg/dL (ref 8.6–10.4)
PTH: 35 pg/mL (ref 16–77)

## 2020-08-13 LAB — VITAMIN D 25 HYDROXY (VIT D DEFICIENCY, FRACTURES): Vit D, 25-Hydroxy: 61 ng/mL (ref 30–100)

## 2020-08-13 LAB — TSH: TSH: 2.69 mIU/L (ref 0.40–4.50)

## 2020-08-13 LAB — T3, FREE: T3, Free: 2.9 pg/mL (ref 2.3–4.2)

## 2020-08-14 NOTE — Progress Notes (Signed)
Subjective:  Patient Name: Shelby Lin Date of Birth: 1940-12-12  MRN: [id number]  Shelby Lin  presents to the office today for follow-up of her hypothyroidism secondary to Hashimoto's disease, questionable nodular goiter, tachycardia, tremor, dyspepsia, fatigue, fibromyalgia, hyperlipidemia, night sweats, tremor, and pre-diabetes.  HISTORY OF PRESENT ILLNESS:   Shelby Lin is a 80 y.o. Caucasian woman.  Shelby Lin was unaccompanied.  1. Shelby Lin was referred to me on 03/25/2005 by her primary care provider, Dr. Carol Ada, for evaluation of her thyroid problems. Shelby Lin was 59 at that time.   A. At about age 58 the patient developed thyroid problems and was put on a thyroid medicine. Later when she became pregnant her obstetrician took her off that medicine. At about age 50, she developed severe fatigue and weight gain. She was put back on Synthroid. Her dose of Synthroid was 88 mcg per day for many years. In January 2006, she had fluctuations in her thyroid function tests. Synthroid was increased to 100 mcg and later to 125 mcg per day. The Synthroid dose was then reduced to 75 mcg. After blood tests about 9 months prior to her first appointment with me, the patient was put back on a dose of 88 mcg per day. When the patient was having fluctuations in her thyroid tests, her thyroid gland became visibly enlarged and felt full and tender to palpation. She was also more hoarse during those periods. Hoarseness came and went in parallel with the thyroid gland swelling. Ultrasound studies of the thyroid in January 2006 and January 2007 showed a multinodular goiter.   B. Thyroid function tests performed at that first visit showed a TSH of 4.139, free T4 1.22, and free T3 of 2.7. Because any TSH at that time greater than 3.0 was considered to be elevated physiologically, I increased her Synthroid from 88 to 100 mcg per day. TPO antibody was markedly positive at 954.0, c/w the diagnosis of Hashimoto's  thyroiditis. Her TSI level was quite normal at 0.9.   2. During the last 15 years the patient has had several medical issues that we have followed:  A. She has had several flare-ups of Hashimoto's disease, resulting in several changes in her doses of Synthroid. Her Synthroid dose was subsequently decreased to 88 mcg/day in January 2018.   B. Although she was initially thought to have a multinodular goiter, over time it became clear that all but one of the nodules were actually areas of echotexture heterogeneity due to Hashimoto's thyroiditis. She has had one "nodularish" area of her left inferior pole that has remained unchanged since 2011. This area is ill-defined and may also just represent several overlapping areas of heterogeneity.  C. She has also had problems with fibromyalgia, arthritis, hypertension, depression, fatigue, tremor, hyperlipidemia, reflux, dyspepsia, and vitamin D deficiency.   D. In August 2016 her HbA1c was 6.2%, c/w prediabetes. Since hen her HbA1c values have varied from 5.8% to 6.4%. The patient refused to take metformin because three of her relatives lost their scalp hair when they took metformin.    3. The patient's last [facility] visit was on 04/17/20. At that visit I continued her Synthroid dose of 88 mcg/day for 6 days each week, but only 1/2 tablet on the 7th days and continued her MVI and Biotech.   A. In the interim she has been healthy.   1). She had bilateral cataractectomies in March and April 2022. Her vision is good now.   2). She saw her cardiologist about a  month ago. Her nuclear stress test was normal.   B. She still has chronic constipation.  Linzess caused diarrhea, so she now takes a stool softener daily and prunes. She had normal colonoscopy in January 2022.    C. Her Meniere's disease has been quiescent recently. She has not had any vertigo or ringing and roaring in her ears recently. She has not had any additional falls.      D. Her COPD is about the same. When  she wears a mask it feels to her as if she is having more difficulty breathing, especially if she is more physically active, especially in the humid weather. She is not having many allergy problems now.      E. Her tension headaches are "about the same". Cervical stretching helps when she does her cervical stretching exercises regularly.   F. She is always tired. Her energy has been "a little bit low". She says that she sleeps well. Her husband no longer complains about her snoring. She has not awakened gasping for breath.     G. She still has nocturnal hot flashes occasionally.     H. She is doing "great' emotionally.  Her family problems are unchanged.      I. She continues to have problems with fibromyalgia and arthritis in her wrists, hands, knees, and feet, worse in the cold weather.   H. Her feet are doing better since having [employer] therapy. .     I. She is hoarse sometimes, but not a lot. The hoarseness still comes and goes in association with her allergies.   J. She still has acid indigestion almost every night. She remains on her omeprazole, 20 mg twice daily. She still takes Synthroid, 88 mcg/day for 6 days each week, but only 1/2 tablet of Sundays. She also takes losartan, 100 mg/day, an MVI and Biotech vitamin D, 50,000 IU/weekly. Her cardiologist increased her rosuvastatin to 20 mg/day.   K. She has not had sweet tea very often. She has reduced her rice and bread intake.     4. Pertinent Review of Systems:  Constitutional: "I feel alright." Some days are better than others.  Her stamina and energy are about the same. The SOB is about the same.  Eyes: As above. Vision is good as long as she wears her glasses.  Neck: The thyroid gland has not seemed to swell lately. The patient has no complaints of anterior neck soreness, tenderness,  pressure, discomfort, or difficulty swallowing.  Heart: She has not had any other chest symptoms. Heart rate increases with exercise or other physical  activity. The patient has no other complaints of palpitations, irregular heat beats, chest pain, or chest pressure. Gastrointestinal: As above. Omeprazole is no longer successful in controlling her acid reflux, indigestion, and dyspepsia. She also sometimes feels like food sticks in her esophagus or that she has a spasm of the esophagus  Hands: She still has her tremor.  Legs: Muscle mass and strength seem normal. Legs are stronger when she walks more. She no longer has episodic leg numbness and burning. She has had some edema occasionally, more in the left leg.  Feet: Her feet still burn in the forefeet at times.    GYN: She still gets hot flashes, but much less frequently and much less intensely.    Neuro: Her neuropathy in her feet is about the same.  Emotional/psychiatric: She feels "great". Mental: She does "well" at thinking, paying attention, remembering, and making decisions. "I still have all  my marbles." She remembers to tell me that same sentence every time she visits.    PAST MEDICAL, FAMILY, AND SOCIAL HISTORY:  Past Medical History:  Diagnosis Date   Anxiety    Arthritis    Asthma in child    Atherosclerotic heart disease of native coronary artery without angina pectoris    Cataracts, bilateral    Combined hyperlipidemia    Constipation, unspecified    COPD (chronic obstructive pulmonary disease) (HCC)    Coronary artery spasm (HCC)    Diabetic neuropathy (HCC)    DM (diabetes mellitus) with complications (HCC)    Dupuytren's contracture    Dyspepsia    Familial tremor    Fatigue    Fibromyalgia syndrome    GERD (gastroesophageal reflux disease)    Hashimoto's thyroiditis    Hiatal hernia    History of colon polyps    Hx of Hashimoto thyroiditis    Hypercholesteremia    Hypertension    Hypothyroidism, acquired, autoimmune    IBS (irritable bowel syndrome)    Meniere's disease of left ear    Migraine without aura and without status migrainosus, not intractable     Multinodular goiter (nontoxic)    Nonexudative macular degeneration    Raynaud's syndrome without gangrene    Situational stress    Tachycardia    Thyroiditis, autoimmune    Ulcerative colitis, unspecified, without complications ([place])    Vitamin B 12 deficiency    Vitamin D deficiency    Wears glasses     Family History  Problem Relation Age of Onset   Cancer Mother    Hypertension Mother    Stroke Mother    Heart attack Father    Sudden death Father    Hypertension Sister    Diabetes Sister    Stroke Sister    Cancer Maternal Uncle        breast,    Cancer Paternal Aunt        breast, ovarian   Thyroid disease Neg Hx      Current Outpatient Medications:    albuterol (VENTOLIN HFA) 108 (90 Base) MCG/ACT inhaler, Inhale 1-2 puffs into the lungs every 6 (six) hours as needed for wheezing or shortness of breath., Disp: 18 g, Rfl: 6   aspirin EC 81 MG tablet, Take 1 tablet (81 mg total) by mouth daily., Disp: , Rfl:    BESIVANCE 0.6 % SUSP, Place 1 drop into the right eye 3 (three) times daily., Disp: , Rfl:    Cyanocobalamin (VITAMIN B 12 PO), Take by mouth daily., Disp: , Rfl:    DULoxetine (CYMBALTA) 30 MG capsule, Take 30 mg by mouth 2 (two) times daily., Disp: , Rfl:    Fluticasone-Umeclidin-Vilant (TRELEGY ELLIPTA) 100-62.5-25 MCG/INH AEPB, TAKE 1 PUFF BY MOUTH EVERY DAY, Disp: 180 each, Rfl: 3   Fluticasone-Umeclidin-Vilant (TRELEGY ELLIPTA) 100-62.5-25 MCG/INH AEPB, Inhale 1 puff into the lungs daily., Disp: 180 each, Rfl: 3   furosemide (LASIX) 20 MG tablet, Take 20 mg by mouth as needed., Disp: , Rfl:    hydrochlorothiazide (HYDRODIURIL) 25 MG tablet, TAKE 1 TABLET BY MOUTH EVERY DAY IN THE MORNING, Disp: , Rfl: 1   levothyroxine (SYNTHROID) 88 MCG tablet, Take 1 tablet (88 mcg total) by mouth daily before breakfast. 1 tablet  6 days each week, but take only 1/2 pill on the 7th day, Disp: 30 tablet, Rfl: 5   LORazepam (ATIVAN) 0.5 MG tablet, TAKE 1/2 TAB EVERY 8 12  HOURS AS NEEDED FOR  ANXIOUSNESS, Disp: , Rfl:    losartan (COZAAR) 100 MG tablet, Take 100 mg by mouth daily., Disp: , Rfl: 1   meclizine (ANTIVERT) 25 MG tablet, 1/2 to 1 tablet by mouth every 8 hrs as needed for diziness, Disp: , Rfl:    Multiple Vitamin (MULTIVITAMIN) tablet, Take 1 tablet by mouth daily., Disp: , Rfl:    Multiple Vitamins-Minerals (PRESERVISION AREDS) TABS, See admin instructions., Disp: , Rfl:    omeprazole (PRILOSEC) 20 MG capsule, Take 20 mg by mouth daily., Disp: , Rfl:    prednisoLONE acetate (PRED FORTE) 1 % ophthalmic suspension, Place 1 drop into the right eye 4 (four) times daily., Disp: , Rfl:    PROLENSA 0.07 % SOLN, Place 1 drop into the right eye at bedtime., Disp: , Rfl:    Respiratory Therapy Supplies (FLUTTER) DEVI, Use device 2-3 times a day to break up congestion, Disp: 1 each, Rfl: 0   rosuvastatin (CRESTOR) 20 MG tablet, Take 1 tablet (20 mg total) by mouth daily., Disp: 90 tablet, Rfl: 3   VITAMIN D, CHOLECALCIFEROL, PO, Take 50,000 Units by mouth once a week. , Disp: , Rfl:   Allergies as of 08/15/2020 - Review Complete 08/15/2020  Allergen Reaction Noted   Acyclovir and related  04/01/2017   Allegra [fexofenadine] Hives 04/28/2018   Amoxicillin Hives 06/18/2010   Codeine Nausea And Vomiting 02/21/2013   Elemental sulfur Hives 09/08/2016   Enalapril maleate Cough 04/28/2018   Hydrocodone bit-homatrop mbr Other (See Comments) 04/06/2020   Keflex [cephalexin] Hives 04/28/2018   Levofloxacin Other (See Comments) 04/28/2018   Lorabid [loracarbef] Hives 06/18/2010   Other Other (See Comments) 04/06/2020   Sulfa antibiotics Hives 06/18/2010    1. Work and Family: She retired in June 2013, but later went back to work part-time. She stopped part-time work in May 2020. She has not been as busy during the pandemic. Her husband retired at the same time.    2. Activities: She has been walking much outside.   3. Smoking, alcohol, or drugs: None 4. Primary  Care Provider: Dr. Carol Ada, [facility] [place]: There are no other significant problems involving Jalilah's other body systems.   Objective:  Vital Signs:  BP 118/72 (BP Location: Right Arm, Patient Position: Sitting, Cuff Size: Normal)   Pulse 80   Wt 155 lb 6.4 oz (70.5 kg)   BMI 26.67 kg/m   Wt Readings from Last 3 Encounters:  08/15/20 155 lb 6.4 oz (70.5 kg)  06/12/20 158 lb (71.7 kg)  05/28/20 158 lb 12.8 oz (72 kg)    Ht Readings from Last 3 Encounters:  06/12/20 5\' 4"  (1.626 m)  05/28/20 5' 4.5" (1.638 m)  04/24/20 5' 4.5" (1.638 m)    HC Readings from Last 3 Encounters:  No data found for [facility]   Facility age limit for growth percentiles is 20 years.  Body mass index is 26.67 kg/m. Facility age limit for growth percentiles is 20 years.  Body surface area is 1.78 meters squared.  PHYSICAL EXAMINATION  Constitutional: Deajah looks good today. She is alert, bright, and upbeat. Her affect and insight are normal. Her weight has increased 12 ounces since her last visit.  Eyes: There is no obvious arcus or proptosis. Moisture appears normal. Mouth: The oropharynx and tongue appear normal. Oral moisture is normal. There is no oral hyperpigmentation. Neck: The neck appears to be visibly normal. No carotid bruits are noted. The thyroid gland is again  within normal limits of 18-20 grams in size. The thyroid gland is not tender to palpation. Left trapezius muscle: I again feel the 4-5 mm nodular area in the skin superficial to the mid-portion of the left trapezius muscle. This area still feels like a sebaceous cyst.  Lungs: The lungs are clear to auscultation. Air movement is fair.   Heart: Heart rate and rhythm are regular. Heart sounds S1 and S2 are normal. I did not appreciate any pathologic cardiac murmurs. Abdomen: The abdomen is enlarged. Bowel sounds are normal. There is no obvious hepatomegaly, splenomegaly, or other mass effect. The  abdomen is not tender to palpation. Arms: Muscle size and bulk are normal for age.  Hands: There is a 1+ tremor. Phalangeal and metacarpophalangeal joints are normal. Palmar muscles are low-normal. Palmar moisture is normal. There is no palmar erythema. Palms are warm. Legs: Muscles appear normal for age. No edema is present.  Neurologic: Strength is normal for age in both the upper and lower extremities. Muscle tone is normal. Sensation to touch is normal in the legs and feet.   LAB DATA:   Labs 08/15/20: HbA1c 5.8%, CBG 118  Labs 08/10/20: TSH 2.69, free T4 1.3, free T3 2.8; PTH 35 (ref 16-77), calcium 9.7, 25-OH vitamin D 61;  Labs 04/17/20: HbA1c 6.3%, CBG 128  Labs 01/10/20: HbA1c 6.3%, CBG 129; TSH 0.80, free T4 1.6, free T3 3.1; PTH 31, calcium 9.8, 25-OH vitamin D 64  Labs 10/10/19; HbA1c 6.5%, CBG 132  Labs 10/05/19: TSH 1.26, free 4 1.4, free T3 3.3; PTH 20, calcium 10.0, 25-OH vitamin D 46  Labs 04/11/19: HbA1c 6.3%, CBG 139  Labs 04/01/19: TSH 1.77, free T4  1.3, free T3 3.1  Labs 01/11/19:; HbA1c 6.2%; TSH 2.72, free T4 1.2, free T3 3.0; PTH 24, calcium 10.0, 25-OH vitamin D 58  Labs 10/05/18: HbA1c 6.3%; TSH 0.34, free T4 1.2, free T3 3.0; PTH 18, calcium 9.9, 25-OH vitamin D 43  Labs 04/09/18: HbA1c 6.4%, CBG 115  Labs 04/08/18: TSH 1.02, free T4 1.4, free T3 2.4; PTH 16, calcium 9.5, 25-OH vitamin D 45  Labs 10/07/17: HbA1c 6.2%, CBG 135  Labs 10/02/17: TSH 0.66, free T4 1.3, free T3.9; PTH 36, calcium 9.6, 25-OH vitamin D 53 2  Labs 04/20/17: HbA1c 6.0%, CBG 112  Labs 04/16/17: TSH 1.12, free 4 1.5, free T3 3.2; 25-OH vitamin D 55  Labs 10/13/16: HbA1c 6.1%, CBG 122  Labs 10/07/16: TSH 2.63, free T4 1.3, free T3 2.9; 25-OH vitamin D 47  Labs 06/05/16: HbA1c 5.8%, fasting glucose 120; TSH  1.40, free T4 1.1, free T3 2.7  Labs 02/22/16: HbA1c 6.4%, CBG 104, C-peptide 1.57 (ref 080-3.85); PTH 28, calcium 10, 25-OH vitamin D 45; TSH 0.16, free T4 1.3, free T3 2.8; CMP  normal  Labs 11/11/15: TSH 0.05, free T4 1.5, free T3 3.6  Labs 08/14/15: HbA1c 5.8%  Labs 04/27/15: HbA1c 6.3%; TSH 1.04, free T4 1.3, free T3 2.8; PTH 45, calcium 9.4, 25-OH vitamin D 19  Labs 04/24/15: C-peptide 1.43 (normal 0.80-3.90)  Labs 12/04/14: HbA1c 6.1%  Labs 09/26/14 at 12:01 PM: CMP normal; HbA1c 6.2%;CBC normal, iron 81; ACTH 21, cortisol 12.9; vitamin B12 705 (normal 211-911)  09/19/14: TSH 1.640, free T4 1.03, free T3 2.6; Calcium 9.3, PTH 33 (increased from 26 in March), 25-OH vitamin D 21 (decreased from 23 in march)  04/28/14: TSH 0.081, free T4 1.47, free T3 3.3; calcium 9.4, PTH 26, 25-OH vitamin D 23  10/27/13: TSH 1.087,  free T4 1.04, free T3 2.7; Calcium 9.6, PTH 32, 25-hydroxyvitamin D 34  05/18/13: TSH 0.052, free T4 1.24, free T3 3.2  04/28/13: TSH 0.426, free T4 1.24, free T3 3.2; calcium 9.7, 25-hydroxy vitamin D 36  10/08/12: TSH 2.106, free T4 1.05, free T3 2.6; 25-OH vitamin D 37, PTH 39.4, calcium 9.1  04/02/12: TSH 1.073, free T4 1.31, free T3 2.9, calcium 9.5, 25-hydroxy vitamin D 31 - PTH was ordered but not done. I have re-ordered it this morning.  09/30/11: TSH 8.095, free T4 1.16, free T3 2.4. PTH 24, calcium 9.3, 25-hydroxy vitamin D 33, 1,25-dihydroxy vitamin D 95 11/21/10: Calcium was 9.5. Her PTH was 37.6. 25-hydroxy vitamin D was 41. 1, 25-dihydroxy vitamin D was 85, which was slightly elevated. B12 was 352 (211-911)    IMAGING:   12/07/14 US thyroid: Both lobes are smaller, with maximum dimensions <2.5 cm. Both lobes are heterogenous in echotexture. The previously seen exophytic, solid, hypoechoic, ill-defined nodule in the left inferior pole remains at 0.9 x 0.4 x 0.5 cm and is unchanged from 08/23/2009.   10/12/12: US thyroid gland: Small 10 x 4 x 5 mm nodule or adjacent lymph node or parathyroid adenoma. Diffuse echogenicity. As I read the [place], there is definitely a lot of echogenicity c/w Hashimoto's thyroiditis. There may be a small nodule that is  actually a bit less than one cm in longest dimension. This is unchanged since June 2011.   Assessment and Plan:   ASSESSMENT:  1. Hypothyroid:   A. She has acquired hypothyroidism due to Hashimoto's thyroiditis. During the past 15 years her TFTs have been variable, requiring several changes in her Synthroid dosage. Some of the variability was due to flare ups of Hashimoto's disease causing Hashitoxicosis.   B. Her TFTs in February 2019 were in the middle of the real physiologic normal range on her current Synthroid dose. Her TFTs in August 2019 were more variable. Her TSH was lower, her free T4 was lower, and her free T3 was higher. This pattern is most c/w having had a recent  flare up of thyroiditis. In contrast, her TFTs in February 2020 were mid-normal.   C. At her August 2020 visit her TSH was low, her free T4 was lower, and her free T3 was higher. It was likely that she had been having another flare up of thyroiditis. It was possible, however, that her tremor was due to relatively high thyroid hormone levels. It was prudent to reduce her Synthroid dose a bit and repeat her lab tests in two months.   D. Her TFTs in August 2021 and October 2021 were in goal range, but she was still mildly tremulous. Her TFTs in November 2021 were high-normal, so I reduced her levothyroxine dose slightly. Her TFTs in June 2022 are normal at about the 20% of the physiologic range. Her tremor is not caused by hyperthyroidism. .  2. Thyroiditis: The patient's Hashimoto's disease has usually been clinically quiescent, but was mildly Hashitoxic in the January-March 2016 time period. Her thyroid US in October 2016 showed the heterogeneity c/w Hashimoto's thyroiditis. As noted above, she appeared to have had a recent flare up of thyroiditis prior to her August 2019 visit and again prior to her August 2020 visit. Her thyroiditis is clinically quiescent again today.  3. Multinodular goiter:   A. The thyroid gland was again  within normal limits for size at her February 2020 visit.   B. Her thyroid gland was borderline enlarged at her  August 2020 visit, with the left lobe being a bit larger than the right. I dd not palpate any nodular area.   C. In October 2021, in February 2022, and again at today's visit in June 2022 her thyroid gland is normal in size.   D. The waxing and waning of thyroid gland size is c/w evolving Hashimoto's disease.   E. Although some [place] studies have shown a nodule, her [place] study in June 2011 showed either a questionable nodule or demonstrated the echotexture heterogeneity that is commonly seen with Hashimoto's disease. Her [place] studies in August 2014 and October 2016 were essentially unchanged. She did not need a FNA at those times. 4. Headaches: Headaches appear to be "tension" headaches. They have significantly decreased in frequency and severity. She does better when she performs her cervical stretching exercises daily.   5. Hot flashes and sweats: These problems are chronic, but not debilitating.    6-7. Leg pains/vitamin D deficiency disease:   A. Her labs in July 2016 showed that her calcium was normal at 9.3, just below the 50%. Her PTH value was mid-range normal. Her vitamin D was lower. She needed more calcium and vitamin D. I had previously asked her to take Citracal-D at doses of 1200-1500 mg of calcium per day and [phone] IU of vitamin D per day.   B. Her labs in March 2017 showed that her PTH value of 45 was higher, but still mid-normal. Calcium was a bit higher at 9.4. Vitamin D was lower at 19. At that point she agreed to purchase and take the Biotech form of vitamin D, one 50,000 IU capsule each week.   C. Her PTH, calcium and vitamin D were normal in December 2017, although the vitamin D had decreased.   D. She is now taking Biotech vitamin D, 50,000 units each week.  Her vitamin D levels were normal again in August 2018, in August 2019, in February 2020, in August 2020, in November  2020, in August 2021, and again in October 2021. Her PTH was lower in February 2020 and August 2020, slightly higher in November 2020, lower in August 2021 and October 2021, but appropriate to her calcium level. Her PTH, calcium, and vitamin D levels in June 2022 were good  8. Meniere's disease: Her vertigo has been fairly quiescent, but she occasionally notes symptoms. . Her Meniere's disease has not bothered her very often in the past few months. Her hearing in the left ear varies.  9. Hoarseness: In the past, the fact that her hoarseness and post nasal drip came and went essentially in parallel indicate that the hoarseness was likely due to flare ups of her allergies. In addition, several flare ups of Hashimoto's disease have also caused hoarseness in the past. Her hoarseness has not been bothering her much recently.  10. Dyspepsia: This problem is no longer well controlled with omeprazole. It is reasonable to try her on another PPI, such as rabeprazole (Aciphex). She will call her insurance company to ask about what options for PPIs are available to her. She will also discuss this issue with her GI doctor next month at her scheduled exam.   11. Hypertension: Her BP is normal today on her losartan.     13. Depression: Her affect and insight are very good today.   14. Fibromyalgia syndrome: This condition remains a problem for her, but seems to have worsened.  Exercise will help her "re-charge her mitochondrial batteries".  15. Fatigue:  This problem  has also worsened, generally paralleling her COPD. 16. Prediabetes:  A. Her HbA1c values in July and in October 2016 were elevated into the "pre-diabetes" range. Her HbA1c values have fluctuated over time depending upon her diet and her activity levels.  Her HbA1c today was lower today in November 2021 than it was in August, but still in the prediabetes range.  I asked her to tighten down on her carb intake. I instructed her on the Eat Right Diet and the  Leonore recipes.  B. Her HbA1c today is still in the prediabetes range, but much lower. I previously offered to start metformin, but she declined.  17. Arthritis: Her arthritis seems to be doing fairly well.   43. Overweight: Shelby Lin is still mildly overweight. 19. Tremor: Her tremor has varied in the past and is about the same today. If the tremor worsens she may benefit from a referral to neurology.      PLAN:  1. Diagnostic: Repeat labs prior to next visit: TFTs, PTH, calcium, 25-Oh vitamin D 2. Therapeutic: Try to do daily physical activity. Eat Right Diet and [facility] Diet recipes. Watch what she eats and drinks. Continue Synthroid dose of  88 mcg/day for 6 days each week, but take only 1/2 pill on the 7th days. Take MVI daily and Biotech, 50,000 IU per week. Take one Tums at bedtime. Change omeprazole to rabeprazole.  3. Patient education: We again discussed the issues of prediabetes, headaches, Hashimoto's disease, hypothyroidism, fibromyalgia, fatigue, and COPD. We discussed what she needs to do to control her BGs, to include getting more exercise and following our Eat Right Diet. She knows what she needs to do to get her weight back under control. She is trying hard.  Unfortunately, her COPD and the cold weather make that increasingly more difficult.    4. Follow-up: 4 months  Level of Service: This visit lasted in excess of 60 minutes. More than 50% of the visit was devoted to counseling.   Tillman Sers, MD, CDE Adult and Pediatric Endocrinology  At [facility], we are committed to providing exceptional care. You will receive a patient satisfaction survey through text or e-mail regarding your visit today. Your opinion is important to me. Comments are appreciated.

## 2020-08-15 ENCOUNTER — Encounter (INDEPENDENT_AMBULATORY_CARE_PROVIDER_SITE_OTHER): Payer: Self-pay | Admitting: "Endocrinology

## 2020-08-15 ENCOUNTER — Ambulatory Visit (INDEPENDENT_AMBULATORY_CARE_PROVIDER_SITE_OTHER): Payer: Medicare Other | Admitting: "Endocrinology

## 2020-08-15 ENCOUNTER — Other Ambulatory Visit: Payer: Self-pay

## 2020-08-15 VITALS — BP 118/72 | HR 80 | Wt 155.4 lb

## 2020-08-15 DIAGNOSIS — E559 Vitamin D deficiency, unspecified: Secondary | ICD-10-CM

## 2020-08-15 DIAGNOSIS — H8110 Benign paroxysmal vertigo, unspecified ear: Secondary | ICD-10-CM | POA: Diagnosis not present

## 2020-08-15 DIAGNOSIS — R490 Dysphonia: Secondary | ICD-10-CM | POA: Diagnosis not present

## 2020-08-15 DIAGNOSIS — R7303 Prediabetes: Secondary | ICD-10-CM | POA: Diagnosis not present

## 2020-08-15 DIAGNOSIS — I10 Essential (primary) hypertension: Secondary | ICD-10-CM

## 2020-08-15 DIAGNOSIS — E042 Nontoxic multinodular goiter: Secondary | ICD-10-CM | POA: Diagnosis not present

## 2020-08-15 DIAGNOSIS — E063 Autoimmune thyroiditis: Secondary | ICD-10-CM | POA: Diagnosis not present

## 2020-08-15 DIAGNOSIS — R1013 Epigastric pain: Secondary | ICD-10-CM | POA: Diagnosis not present

## 2020-08-15 DIAGNOSIS — E663 Overweight: Secondary | ICD-10-CM

## 2020-08-15 DIAGNOSIS — G250 Essential tremor: Secondary | ICD-10-CM | POA: Diagnosis not present

## 2020-08-15 LAB — POCT GLYCOSYLATED HEMOGLOBIN (HGB A1C): Hemoglobin A1C: 5.8 % — AB (ref 4.0–5.6)

## 2020-08-15 LAB — POCT GLUCOSE (DEVICE FOR HOME USE): Glucose Fasting, POC: 118 mg/dL — AB (ref 70–99)

## 2020-08-15 MED ORDER — RABEPRAZOLE SODIUM 20 MG PO TBEC: DELAYED_RELEASE_TABLET | ORAL | 3 refills | Status: DC

## 2020-08-15 NOTE — Patient Instructions (Addendum)
Follow up visit in 4 months. Please repeat lab tests 1-2 weeks prior to next visit.

## 2020-09-02 DIAGNOSIS — J449 Chronic obstructive pulmonary disease, unspecified: Secondary | ICD-10-CM | POA: Diagnosis not present

## 2020-09-03 DIAGNOSIS — S8011XA Contusion of right lower leg, initial encounter: Secondary | ICD-10-CM | POA: Diagnosis not present

## 2020-09-03 DIAGNOSIS — T148XXA Other injury of unspecified body region, initial encounter: Secondary | ICD-10-CM | POA: Diagnosis not present

## 2020-09-03 DIAGNOSIS — R058 Other specified cough: Secondary | ICD-10-CM | POA: Diagnosis not present

## 2020-09-10 ENCOUNTER — Ambulatory Visit (INDEPENDENT_AMBULATORY_CARE_PROVIDER_SITE_OTHER): Payer: Medicare Other

## 2020-09-10 ENCOUNTER — Telehealth: Payer: Self-pay | Admitting: Internal Medicine

## 2020-09-10 ENCOUNTER — Encounter: Payer: Self-pay | Admitting: Acute Care

## 2020-09-10 ENCOUNTER — Telehealth: Payer: Self-pay | Admitting: Acute Care

## 2020-09-10 ENCOUNTER — Other Ambulatory Visit: Payer: Self-pay | Admitting: Acute Care

## 2020-09-10 ENCOUNTER — Ambulatory Visit: Payer: Medicare Other | Admitting: Acute Care

## 2020-09-10 ENCOUNTER — Other Ambulatory Visit: Payer: Self-pay

## 2020-09-10 VITALS — BP 118/74 | HR 97 | Temp 97.9°F | Ht 64.0 in | Wt 153.4 lb

## 2020-09-10 DIAGNOSIS — J441 Chronic obstructive pulmonary disease with (acute) exacerbation: Secondary | ICD-10-CM | POA: Diagnosis not present

## 2020-09-10 DIAGNOSIS — R059 Cough, unspecified: Secondary | ICD-10-CM | POA: Diagnosis not present

## 2020-09-10 MED ORDER — DOXYCYCLINE HYCLATE 100 MG PO TABS: 100.0000 mg | ORAL_TABLET | Freq: Two times a day (BID) | ORAL | 0 refills | Status: DC

## 2020-09-10 MED ORDER — PREDNISONE 10 MG PO TABS: ORAL_TABLET | ORAL | 0 refills | Status: DC

## 2020-09-10 NOTE — Telephone Encounter (Signed)
Called and spoke with patient. She stated that she last took an at home covid test yesterday and it was negative. She also added that her PCP had prescribed a zpak in which she finished on Friday but she has not noticed any relief.   She has been scheduled to see SG this afternoon at 4pm.   Nothing further needed at time of call.

## 2020-09-10 NOTE — Progress Notes (Addendum)
History of Present Illness Shelby Lin is a 80 y.o. female former smoker ( Quit 2002 with a 21 pack year smoking history) with COPD and nocturnal hypoxemia. She is followed by Dr. Chase Lin   09/10/2020 Pt. Presents for an acute visit. She called the office today with complaints of non-productive cough that started a week ago. Over the past week, it has turned to productive. She is now coughing up thick greenish, yellowish phlegm. She only has increased SOB during and after severe coughing spells. Once she is able to calm down (usually 15-20 mins later), her breathing tends to go back to baseline.She does have wheezing with the cough.  She took a fall through a deck on July 4th, and she started having issues with her breathing 3 days after.    She denied any wheezing, fever or chills. She took a at home covid test on Thursday and it was negative.   She is still using her Trelegy 132mcg once daily and albuterol HFA as needed.  CXR was ordered with today's acute OV. Results are pending.   Today, the patient states she has not had to use her rescue inhaler, as it makes her very jittery. She is compliant with her oxygen every night. She was seen by her PCP last Monday 7/11. He treated her with a z pack and gave her tessalon perles. She said she is no better. Secretions are thick green after treatment with z pack. Her cough remains very limiting.Causes her shortness of breath before she can recover.  She wanted something stronger than tessalon perles, but she has an allergy to Codeine which is very limiting. Test Results: PFT's 2022>> Severe obstructive disease Decreased DLCO 52% in 2019. CXR today  09/10/2020 shows no active disease.    CBC Latest Ref Rng & Units 02/28/2018 09/26/2014 02/28/2013  WBC 4.0 - 10.5 K/uL 14.0(H) 8.8 -  Hemoglobin 12.0 - 15.0 g/dL 11.6(L) 13.1 12.6  Hematocrit 36.0 - 46.0 % 37.0 39.4 -  Platelets 150 - 400 K/uL 382 413(H) -    BMP Latest Ref Rng & Units 08/10/2020  01/10/2020 10/05/2019  Glucose 70 - 99 mg/dL - - -  BUN 8 - 23 mg/dL - - -  Creatinine 0.44 - 1.00 mg/dL - - -  Sodium 135 - 145 mmol/L - - -  Potassium 3.5 - 5.1 mmol/L - - -  Chloride 98 - 111 mmol/L - - -  CO2 22 - 32 mmol/L - - -  Calcium 8.6 - 10.4 mg/dL 9.7 9.8 10.0    BNP    Component Value Date/Time   BNP 25.5 02/28/2018 1732    ProBNP No results found for: PROBNP  PFT    Component Value Date/Time   FEV1PRE 0.76 04/01/2017 0941   FEV1POST 0.96 04/01/2017 0941   FVCPRE 1.69 04/01/2017 0941   FVCPOST 2.02 04/01/2017 0941   DLCOUNC 12.81 04/01/2017 0941   PREFEV1FVCRT 45 04/01/2017 0941   PSTFEV1FVCRT 48 04/01/2017 0941    DG Chest 2 View  Result Date: 09/10/2020 CLINICAL DATA:  Cough. EXAM: CHEST - 2 VIEW COMPARISON:  PA and lateral chest 03/25/2019.  CT chest 01/28/2018. FINDINGS: The lungs are clear. Heart size is normal. Aortic atherosclerosis. No pneumothorax or pleural fluid. No acute or focal bony abnormality. IMPRESSION: No acute disease. Aortic Atherosclerosis (ICD10-I70.0). Electronically Signed   By: Inge Rise M.D.   On: 09/10/2020 16:28     Past medical hx Past Medical History:  Diagnosis Date  Anxiety    Arthritis    Asthma in child    Atherosclerotic heart disease of native coronary artery without angina pectoris    Cataracts, bilateral    Combined hyperlipidemia    Constipation, unspecified    COPD (chronic obstructive pulmonary disease) (HCC)    Coronary artery spasm (HCC)    Diabetic neuropathy (HCC)    DM (diabetes mellitus) with complications (HCC)    Dupuytren's contracture    Dyspepsia    Familial tremor    Fatigue    Fibromyalgia syndrome    GERD (gastroesophageal reflux disease)    Hashimoto's thyroiditis    Hiatal hernia    History of colon polyps    Hx of Hashimoto thyroiditis    Hypercholesteremia    Hypertension    Hypothyroidism, acquired, autoimmune    IBS (irritable bowel syndrome)    Meniere's disease of left  ear    Migraine without aura and without status migrainosus, not intractable    Multinodular goiter (nontoxic)    Nonexudative macular degeneration    Raynaud's syndrome without gangrene    Situational stress    Tachycardia    Thyroiditis, autoimmune    Ulcerative colitis, unspecified, without complications (HCC)    Vitamin B 12 deficiency    Vitamin D deficiency    Wears glasses      Social History   Tobacco Use   Smoking status: Former    Packs/day: 0.50    Years: 42.00    Pack years: 21.00    Types: Cigarettes    Quit date: 02/21/2001    Years since quitting: 19.5   Smokeless tobacco: Never  Vaping Use   Vaping Use: Never used  Substance Use Topics   Alcohol use: No   Drug use: No    Shelby Lin reports that she quit smoking about 19 years ago. Her smoking use included cigarettes. She has a 21.00 pack-year smoking history. She has never used smokeless tobacco. She reports that she does not drink alcohol and does not use drugs.  Tobacco Cessation: Former smoker quit 2000 Past surgical hx, Family hx, Social hx all reviewed.  Current Outpatient Medications on File Prior to Visit  Medication Sig   albuterol (VENTOLIN HFA) 108 (90 Base) MCG/ACT inhaler Inhale 1-2 puffs into the lungs every 6 (six) hours as needed for wheezing or shortness of breath.   aspirin EC 81 MG tablet Take 1 tablet (81 mg total) by mouth daily.   benzonatate (TESSALON) 100 MG capsule Take 100 mg by mouth 3 (three) times daily as needed.   BESIVANCE 0.6 % SUSP Place 1 drop into the right eye 3 (three) times daily.   Cyanocobalamin (VITAMIN B 12 PO) Take by mouth daily.   DULoxetine (CYMBALTA) 30 MG capsule Take 30 mg by mouth 2 (two) times daily.   Fluticasone-Umeclidin-Vilant (TRELEGY ELLIPTA) 100-62.5-25 MCG/INH AEPB TAKE 1 PUFF BY MOUTH EVERY DAY   Fluticasone-Umeclidin-Vilant (TRELEGY ELLIPTA) 100-62.5-25 MCG/INH AEPB Inhale 1 puff into the lungs daily.   furosemide (LASIX) 20 MG tablet Take 20 mg  by mouth as needed.   hydrochlorothiazide (HYDRODIURIL) 25 MG tablet TAKE 1 TABLET BY MOUTH EVERY DAY IN THE MORNING   levothyroxine (SYNTHROID) 88 MCG tablet Take 1 tablet (88 mcg total) by mouth daily before breakfast. 1 tablet  6 days each week, but take only 1/2 pill on the 7th day   LORazepam (ATIVAN) 0.5 MG tablet TAKE 1/2 TAB EVERY 8 12 HOURS AS NEEDED FOR ANXIOUSNESS   losartan (COZAAR)  100 MG tablet Take 100 mg by mouth daily.   meclizine (ANTIVERT) 25 MG tablet 1/2 to 1 tablet by mouth every 8 hrs as needed for diziness   Multiple Vitamin (MULTIVITAMIN) tablet Take 1 tablet by mouth daily.   Multiple Vitamins-Minerals (PRESERVISION AREDS) TABS See admin instructions.   omeprazole (PRILOSEC) 20 MG capsule Take 20 mg by mouth daily.   prednisoLONE acetate (PRED FORTE) 1 % ophthalmic suspension Place 1 drop into the right eye 4 (four) times daily.   PROLENSA 0.07 % SOLN Place 1 drop into the right eye at bedtime.   RABEprazole (ACIPHEX) 20 MG tablet Take one tablet twice daily.   Respiratory Therapy Supplies (FLUTTER) DEVI Use device 2-3 times a day to break up congestion   rosuvastatin (CRESTOR) 20 MG tablet Take 1 tablet (20 mg total) by mouth daily.   VITAMIN D, CHOLECALCIFEROL, PO Take 50,000 Units by mouth once a week.    No current facility-administered medications on file prior to visit.     Allergies  Allergen Reactions   Acyclovir And Related    Allegra [Fexofenadine] Hives   Amoxicillin Hives   Codeine Nausea And Vomiting   Elemental Sulfur Hives   Enalapril Maleate Cough   Hydrocodone Bit-Homatrop Mbr Other (See Comments)   Keflex [Cephalexin] Hives   Levofloxacin Other (See Comments)    insomnia   Lorabid [Loracarbef] Hives   Other Other (See Comments)   Sulfa Antibiotics Hives    Review Of Systems:  Constitutional:   No  weight loss, night sweats,  Fevers, chills, fatigue, or  lassitude.  HEENT:   No headaches,  Difficulty swallowing,  Tooth/dental  problems, or  Sore throat,                No sneezing, itching, ear ache, nasal congestion, post nasal drip,   CV:  No chest pain,  Orthopnea, PND, swelling in lower extremities, anasarca, dizziness, palpitations, syncope.   GI  No heartburn, indigestion, abdominal pain, nausea, vomiting, diarrhea, change in bowel habits, loss of appetite, bloody stools.   Resp: + shortness of breath with exertion/ cough  none at rest.  + excess mucus, + productive cough,  + non-productive cough,  No coughing up of blood.  + change in color of mucus.  + wheezing.  No chest wall deformity  Skin: no rash or lesions.  GU: no dysuria, change in color of urine, no urgency or frequency.  No flank pain, no hematuria   MS:  No joint pain or swelling.  No decreased range of motion.  No back pain.  Psych:  No change in mood or affect. No depression or anxiety.  No memory loss.   Vital Signs BP 118/74 (BP Location: Left Arm, Cuff Size: Normal)   Pulse 97   Temp 97.9 F (36.6 C) (Oral)   Ht 5\' 4"  (1.626 m)   Wt 153 lb 6.4 oz (69.6 kg)   SpO2 93%   BMI 26.33 kg/m    Physical Exam:  General- No distress,  A&O x 3, pleasant ENT: No sinus tenderness, TM clear, pale nasal mucosa, no oral exudate,no post nasal drip, no LAN Cardiac: S1, S2, regular rate and rhythm, no murmur Chest: No wheeze/ rales/ dullness; no accessory muscle use, no nasal flaring, no sternal retractions Abd.: Soft Non-tender, ND, BS +, Body mass index is 26.33 kg/m. Ext: No clubbing cyanosis, edema Neuro:  normal strength, MAE x 4, A&O x 3 Skin: No rashes, warm and dry Psych: normal  mood and behavior   Assessment/Plan COPD Flare Plan We will treat your with Doxycycline 100 mg twice daily x 7 days. We will also treat you with a prednisone taper. Prednisone taper; 10 mg tablets: 4 tabs x 2 days, 3 tabs x 2 days, 2 tabs x 2 days 1 tab x 2 days then stop.  Add Mucinex 1200 mg daily with a full glass of water. Continue using your  flutter valve.  Sips of water instead of throat clearing Sugar Free [employer] or [employer] originals for throat soothing. Delsym Cough syrup 5 cc's every 12 hours Continue using Tessalon perles as you have been doing Note your daily symptoms > remember "red flags" for COPD:  Increase in cough, increase in sputum production, increase in shortness of breath or activity intolerance. If you notice these symptoms, please call to be seen.    Follow up in 2 weeks with Judson Roch NP or Dr. Chase Lin in 2 weeks to ensure you are better.  Please contact office for sooner follow up if symptoms do not improve or worsen or seek emergency care    I spent 40 minutes dedicated to the care of this patient on the date of this encounter to include pre-visit review of records, face-to-face time with the patient discussing conditions above, post visit ordering of testing, clinical documentation with the electronic health record, making appropriate referrals as documented, and communicating necessary information to the patient's healthcare team.    Magdalen Spatz, NP 09/10/2020  5:36 PM

## 2020-09-10 NOTE — Telephone Encounter (Signed)
Please call patient and let her know her CXR is clear. But to continue the treatment we prescribed today in the office with follow up as we discussed. Thanks so much.

## 2020-09-10 NOTE — Patient Instructions (Addendum)
It is good to see you today. We will treat your with Doxycycline 100 mg twice daily x 7 days. We will also treat you with a prednisone taper. Prednisone taper; 10 mg tablets: 4 tabs x 2 days, 3 tabs x 2 days, 2 tabs x 2 days 1 tab x 2 days then stop.  Add Mucinex 1200 mg daily with a full glass of water. Continue using your flutter valve.  Sips of water instead of throat clearing Sugar Free [employer] or [employer] originals for throat soothing. Delsym Cough syrup 5 cc's every 12 hours Continue using Tessalon perles as you have been doing Note your daily symptoms > remember "red flags" for COPD:  Increase in cough, increase in sputum production, increase in shortness of breath or activity intolerance. If you notice these symptoms, please call to be seen.    Follow up in with Judson Roch NP or [name] in 2 weeks to ensure you ae better.  Please contact office for sooner follow up if symptoms do not improve or worsen or seek emergency care

## 2020-09-10 NOTE — Telephone Encounter (Signed)
Primary Pulmonologist: MR Last office visit and with whom: 04/24/20 with MR What do we see them for (pulmonary problems): COPD Last OV assessment/plan:  1. Stage 3 severe COPD by GOLD classification ([place]) J44.9    2. 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 but after completing breathing test [CAT score at follow-up]    Was appointment offered to patient (explain)?  Patient wanted recommendations first.    Reason for call: Called and spoke with patient. She stated that her cough started about a week ago and was non-productive. Over the past week, it has turned to productive. She is now coughing up thick greenish, yellowish phlegm. She only has increased SOB during and after severe coughing spells. Once she is able to calm down (usually 15-20 mins later), her breathing tends to go back to baseline.   She denied any wheezing, fever or chills. She took a at home covid test on Thursday and it was negative.   She is still using her Trelegy 160mcg once daily and albuterol HFA as needed.   Pharmacy is [facility] [employer] on [address].     (examples of things to ask: : When did symptoms start? Fever? Cough? Productive? Color to sputum? More sputum than usual? Wheezing? Have you needed increased oxygen? Are you taking your respiratory medications? What over the counter measures have you tried?)  Allergies  Allergen Reactions   Acyclovir And Related    Allegra [Fexofenadine] Hives   Amoxicillin Hives   Codeine Nausea And Vomiting   Elemental Sulfur Hives   Enalapril Maleate Cough   Hydrocodone Bit-Homatrop Mbr Other (See Comments)   Keflex [Cephalexin] Hives   Levofloxacin  Other (See Comments)    insomnia   Lorabid [Loracarbef] Hives   Other Other (See Comments)   Sulfa Antibiotics Hives    Immunization History  Administered Date(s) Administered   Influenza Split 11/30/2007, 01/17/2015, 01/08/2016, 11/27/2017, 12/14/2018   Influenza, High Dose Seasonal PF 12/01/2012, 12/05/2013, 12/12/2016, 11/27/2017, 12/14/2018   Influenza-Unspecified 12/18/2011   PFIZER(Purple Top)SARS-COV-2 Vaccination 05/05/2019, 05/26/2019, 12/12/2019   Pneumococcal Conjugate-13 07/18/2015, 10/16/2017   Pneumococcal Polysaccharide-23 10/23/2008, 10/20/2018   Td 07/13/2007   Tdap 07/27/2017   Zoster, Live 05/04/2014    SG, can you please advise since MR is out of the office? Thanks!

## 2020-09-10 NOTE — Telephone Encounter (Signed)
I called and spoke with patient regarding CXR. Patient verbalized understanding, nothing further needed.

## 2020-09-18 DIAGNOSIS — E039 Hypothyroidism, unspecified: Secondary | ICD-10-CM | POA: Diagnosis not present

## 2020-09-18 DIAGNOSIS — M25561 Pain in right knee: Secondary | ICD-10-CM | POA: Diagnosis not present

## 2020-09-18 DIAGNOSIS — S7011XA Contusion of right thigh, initial encounter: Secondary | ICD-10-CM | POA: Diagnosis not present

## 2020-09-18 DIAGNOSIS — I2510 Atherosclerotic heart disease of native coronary artery without angina pectoris: Secondary | ICD-10-CM | POA: Diagnosis not present

## 2020-09-18 DIAGNOSIS — J449 Chronic obstructive pulmonary disease, unspecified: Secondary | ICD-10-CM | POA: Diagnosis not present

## 2020-09-18 DIAGNOSIS — E118 Type 2 diabetes mellitus with unspecified complications: Secondary | ICD-10-CM | POA: Diagnosis not present

## 2020-09-18 DIAGNOSIS — E1140 Type 2 diabetes mellitus with diabetic neuropathy, unspecified: Secondary | ICD-10-CM | POA: Diagnosis not present

## 2020-09-18 DIAGNOSIS — K219 Gastro-esophageal reflux disease without esophagitis: Secondary | ICD-10-CM | POA: Diagnosis not present

## 2020-09-18 DIAGNOSIS — E7800 Pure hypercholesterolemia, unspecified: Secondary | ICD-10-CM | POA: Diagnosis not present

## 2020-09-18 DIAGNOSIS — M79661 Pain in right lower leg: Secondary | ICD-10-CM | POA: Diagnosis not present

## 2020-09-18 DIAGNOSIS — G43009 Migraine without aura, not intractable, without status migrainosus: Secondary | ICD-10-CM | POA: Diagnosis not present

## 2020-09-18 DIAGNOSIS — E1169 Type 2 diabetes mellitus with other specified complication: Secondary | ICD-10-CM | POA: Diagnosis not present

## 2020-09-18 DIAGNOSIS — G8929 Other chronic pain: Secondary | ICD-10-CM | POA: Diagnosis not present

## 2020-09-18 DIAGNOSIS — I10 Essential (primary) hypertension: Secondary | ICD-10-CM | POA: Diagnosis not present

## 2020-09-25 NOTE — Progress Notes (Signed)
This result was shared with the patient 09/10/2020. See telephone note

## 2020-09-27 DIAGNOSIS — R262 Difficulty in walking, not elsewhere classified: Secondary | ICD-10-CM | POA: Diagnosis not present

## 2020-09-27 DIAGNOSIS — S7011XD Contusion of right thigh, subsequent encounter: Secondary | ICD-10-CM | POA: Diagnosis not present

## 2020-09-27 DIAGNOSIS — M6281 Muscle weakness (generalized): Secondary | ICD-10-CM | POA: Diagnosis not present

## 2020-09-27 DIAGNOSIS — M79604 Pain in right leg: Secondary | ICD-10-CM | POA: Diagnosis not present

## 2020-09-27 DIAGNOSIS — M25651 Stiffness of right hip, not elsewhere classified: Secondary | ICD-10-CM | POA: Diagnosis not present

## 2020-10-01 ENCOUNTER — Encounter: Payer: Self-pay | Admitting: Acute Care

## 2020-10-01 ENCOUNTER — Other Ambulatory Visit: Payer: Self-pay

## 2020-10-01 ENCOUNTER — Ambulatory Visit (INDEPENDENT_AMBULATORY_CARE_PROVIDER_SITE_OTHER): Payer: Medicare Other | Admitting: Acute Care

## 2020-10-01 VITALS — BP 114/64 | HR 98 | Temp 97.3°F | Ht 64.5 in | Wt 156.2 lb

## 2020-10-01 DIAGNOSIS — J441 Chronic obstructive pulmonary disease with (acute) exacerbation: Secondary | ICD-10-CM | POA: Diagnosis not present

## 2020-10-01 DIAGNOSIS — J449 Chronic obstructive pulmonary disease, unspecified: Secondary | ICD-10-CM

## 2020-10-01 DIAGNOSIS — G4736 Sleep related hypoventilation in conditions classified elsewhere: Secondary | ICD-10-CM

## 2020-10-01 DIAGNOSIS — J439 Emphysema, unspecified: Secondary | ICD-10-CM | POA: Diagnosis not present

## 2020-10-01 NOTE — Patient Instructions (Signed)
It is great to see you today. Continue using your Trelegy daily as you have been doing. Rinse mouth after use. Continue using your Albuterol for rescue as needed. Wear your night time oxygen nightly.  Try [place] nasal saline gel with aloe. Use in the morning initially. Add evening spray if needed for continued dry sinuses. Follow up with [name] as is already scheduled in November.  .Note your daily symptoms > remember "red flags" for COPD:  Increase in cough, increase in sputum production, increase in shortness of breath or activity intolerance. If you notice these symptoms, please call to be seen.    Please contact office for sooner follow up if symptoms do not improve or worsen or seek emergency care

## 2020-10-01 NOTE — Progress Notes (Signed)
History of Present Illness Shelby Lin is a 80 y.o. female with former smoker ( Quit 2002 with a 21 pack year smoking history) with COPD and nocturnal hypoxemia. She is followed by [name]   10/01/2020 Pt. presents for follow up of productive cough with green sputum . She was seen 7/18 in the office. . She states she had been treated by her PCP with a z pack which did not help her cough.Her cough was so bad she would become breathless after her coughing jags.  We treated her with doxycycline and pred taper, flutter valve and Mucinex. Additionally we implemented the sips of water instead of throat clearing, sugar free hard candy for throat soothing, and added Delsym for cough. She states she is compliant with her nocturnal oxygen. She presents today for follow up. She states she has been doing well. She has had resolution of her cough and sputum has cleared. She is using her Trelegy daily. She has also been using her flutter valve. She has been compliant with her oxygen. She has not had to use her rescue inhaler.  She overall looks well. States she is back to her baseline. She denies any wheezing.   Test Results: PFT's 2022>> Severe obstructive disease Decreased DLCO 52% in 2019. CXR today  09/10/2020 shows no active disease.  CBC Latest Ref Rng & Units 02/28/2018 09/26/2014 02/28/2013  WBC 4.0 - 10.5 K/uL 14.0(H) 8.8 -  Hemoglobin 12.0 - 15.0 g/dL 11.6(L) 13.1 12.6  Hematocrit 36.0 - 46.0 % 37.0 39.4 -  Platelets 150 - 400 K/uL 382 413(H) -    BMP Latest Ref Rng & Units 08/10/2020 01/10/2020 10/05/2019  Glucose 70 - 99 mg/dL - - -  BUN 8 - 23 mg/dL - - -  Creatinine 0.44 - 1.00 mg/dL - - -  Sodium 135 - 145 mmol/L - - -  Potassium 3.5 - 5.1 mmol/L - - -  Chloride 98 - 111 mmol/L - - -  CO2 22 - 32 mmol/L - - -  Calcium 8.6 - 10.4 mg/dL 9.7 9.8 10.0    BNP    Component Value Date/Time   BNP 25.5 02/28/2018 1732    ProBNP No results found for: PROBNP  PFT    Component Value  Date/Time   FEV1PRE 0.76 04/01/2017 0941   FEV1POST 0.96 04/01/2017 0941   FVCPRE 1.69 04/01/2017 0941   FVCPOST 2.02 04/01/2017 0941   DLCOUNC 12.81 04/01/2017 0941   PREFEV1FVCRT 45 04/01/2017 0941   PSTFEV1FVCRT 48 04/01/2017 0941    DG Chest 2 View  Result Date: 09/10/2020 CLINICAL DATA:  Cough. EXAM: CHEST - 2 VIEW COMPARISON:  PA and lateral chest 03/25/2019.  CT chest 01/28/2018. FINDINGS: The lungs are clear. Heart size is normal. Aortic atherosclerosis. No pneumothorax or pleural fluid. No acute or focal bony abnormality. IMPRESSION: No acute disease. Aortic Atherosclerosis (ICD10-I70.0). Electronically Signed   By: Inge Rise M.D.   On: 09/10/2020 16:28     Past medical hx Past Medical History:  Diagnosis Date   Anxiety    Arthritis    Asthma in child    Atherosclerotic heart disease of native coronary artery without angina pectoris    Cataracts, bilateral    Combined hyperlipidemia    Constipation, unspecified    COPD (chronic obstructive pulmonary disease) (HCC)    Coronary artery spasm (HCC)    Diabetic neuropathy (HCC)    DM (diabetes mellitus) with complications ([place])    Dupuytren's contracture  Dyspepsia    Familial tremor    Fatigue    Fibromyalgia syndrome    GERD (gastroesophageal reflux disease)    Hashimoto's thyroiditis    Hiatal hernia    History of colon polyps    Hx of Hashimoto thyroiditis    Hypercholesteremia    Hypertension    Hypothyroidism, acquired, autoimmune    IBS (irritable bowel syndrome)    Meniere's disease of left ear    Migraine without aura and without status migrainosus, not intractable    Multinodular goiter (nontoxic)    Nonexudative macular degeneration    Raynaud's syndrome without gangrene    Situational stress    Tachycardia    Thyroiditis, autoimmune    Ulcerative colitis, unspecified, without complications (HCC)    Vitamin B 12 deficiency    Vitamin D deficiency    Wears glasses      Social History    Tobacco Use   Smoking status: Former    Packs/day: 0.50    Years: 42.00    Pack years: 21.00    Types: Cigarettes    Quit date: 02/21/2001    Years since quitting: 19.6   Smokeless tobacco: Never  Vaping Use   Vaping Use: Never used  Substance Use Topics   Alcohol use: No   Drug use: No    Ms.Wisz reports that she quit smoking about 19 years ago. Her smoking use included cigarettes. She has a 21.00 pack-year smoking history. She has never used smokeless tobacco. She reports that she does not drink alcohol and does not use drugs.  Tobacco Cessation: Former smoker . Quit 2002 with a 42 pack year smoking history   Past surgical hx, Family hx, Social hx all reviewed.  Current Outpatient Medications on File Prior to Visit  Medication Sig   albuterol (VENTOLIN HFA) 108 (90 Base) MCG/ACT inhaler Inhale 1-2 puffs into the lungs every 6 (six) hours as needed for wheezing or shortness of breath.   aspirin EC 81 MG tablet Take 1 tablet (81 mg total) by mouth daily.   benzonatate (TESSALON) 100 MG capsule Take 100 mg by mouth 3 (three) times daily as needed.   BESIVANCE 0.6 % SUSP Place 1 drop into the right eye 3 (three) times daily.   Cyanocobalamin (VITAMIN B 12 PO) Take by mouth daily.   doxycycline (VIBRA-TABS) 100 MG tablet Take 1 tablet (100 mg total) by mouth 2 (two) times daily.   DULoxetine (CYMBALTA) 30 MG capsule Take 30 mg by mouth 2 (two) times daily.   Fluticasone-Umeclidin-Vilant (TRELEGY ELLIPTA) 100-62.5-25 MCG/INH AEPB Inhale 1 puff into the lungs daily.   furosemide (LASIX) 20 MG tablet Take 20 mg by mouth as needed.   hydrochlorothiazide (HYDRODIURIL) 25 MG tablet TAKE 1 TABLET BY MOUTH EVERY DAY IN THE MORNING   levothyroxine (SYNTHROID) 88 MCG tablet Take 1 tablet (88 mcg total) by mouth daily before breakfast. 1 tablet  6 days each week, but take only 1/2 pill on the 7th day   LORazepam (ATIVAN) 0.5 MG tablet TAKE 1/2 TAB EVERY 8 12 HOURS AS NEEDED FOR ANXIOUSNESS    losartan (COZAAR) 100 MG tablet Take 100 mg by mouth daily.   meclizine (ANTIVERT) 25 MG tablet 1/2 to 1 tablet by mouth every 8 hrs as needed for diziness   Multiple Vitamin (MULTIVITAMIN) tablet Take 1 tablet by mouth daily.   Multiple Vitamins-Minerals (PRESERVISION AREDS) TABS See admin instructions.   omeprazole (PRILOSEC) 20 MG capsule Take 20 mg by mouth daily.  prednisoLONE acetate (PRED FORTE) 1 % ophthalmic suspension Place 1 drop into the right eye 4 (four) times daily.   PROLENSA 0.07 % SOLN Place 1 drop into the right eye at bedtime.   RABEprazole (ACIPHEX) 20 MG tablet Take one tablet twice daily.   Respiratory Therapy Supplies (FLUTTER) DEVI Use device 2-3 times a day to break up congestion   rosuvastatin (CRESTOR) 20 MG tablet Take 1 tablet (20 mg total) by mouth daily.   VITAMIN D, CHOLECALCIFEROL, PO Take 50,000 Units by mouth once a week.    No current facility-administered medications on file prior to visit.     Allergies  Allergen Reactions   Acyclovir And Related    Allegra [Fexofenadine] Hives   Amoxicillin Hives   Codeine Nausea And Vomiting   Elemental Sulfur Hives   Enalapril Maleate Cough   Hydrocodone Bit-Homatrop Mbr Other (See Comments)   Keflex [Cephalexin] Hives   Levofloxacin Other (See Comments)    insomnia   Lorabid [Loracarbef] Hives   Other Other (See Comments)   Sulfa Antibiotics Hives    Review Of Systems:  Constitutional:   No  weight loss, night sweats,  Fevers, chills, fatigue, or  lassitude.  HEENT:   No headaches,  Difficulty swallowing,  Tooth/dental problems, or  Sore throat,                No sneezing, itching, ear ache, nasal congestion, post nasal drip,   CV:  No chest pain,  Orthopnea, PND, swelling in lower extremities, anasarca, dizziness, palpitations, syncope.   GI  No heartburn, indigestion, abdominal pain, nausea, vomiting, diarrhea, change in bowel habits, loss of appetite, bloody stools.   Resp: No shortness of  breath with exertion or at rest.  No excess mucus, no productive cough,  No non-productive cough,  No coughing up of blood.  No change in color of mucus.  No wheezing.  No chest wall deformity, return to baseline. Some dyspnea that is heat and weather related.   Skin: no rash or lesions.  GU: no dysuria, change in color of urine, no urgency or frequency.  No flank pain, no hematuria   MS:  No joint pain or swelling.  No decreased range of motion.  No back pain.  Psych:  No change in mood or affect. No depression or anxiety.  No memory loss.   Vital Signs BP 114/64 (BP Location: Right Arm, Cuff Size: Normal)   Pulse 98   Temp (!) 97.3 F (36.3 C) (Oral)   Ht 5' 4.5" (1.638 m)   Wt 156 lb 3.2 oz (70.9 kg)   SpO2 100%   BMI 26.40 kg/m    Physical Exam:  General- No distress,  A&O x 3, pleasant ENT: No sinus tenderness, TM clear, pale nasal mucosa, no oral exudate,no post nasal drip, no LAN Cardiac: S1, S2, regular rate and rhythm, no murmur Chest: No wheeze/ rales/ dullness; no accessory muscle use, no nasal flaring, no sternal retractions Abd.: Soft Non-tender, ND, BS +, Body mass index is 26.4 kg/m.  Ext: No clubbing cyanosis, edema Neuro:  normal strength, MAE x 4, A&O x 3 Skin: No rashes, warm and dry, no lesions Psych: normal mood and behavior   Assessment/Plan COPD Flare Treated with Doxycycline / Pred taper and flutter valve after failing tx with z pack per PCP Resolved symptoms, back to baseline Plan Continue using your Trelegy daily as you have been doing. Rinse mouth after use. Continue using your Albuterol for rescue as  needed. Use Flutter valve as needed for  chest congestion Wear your night time oxygen nightly.  Try [place] nasal saline gel with aloe. Use in the morning initially. Add evening spray if needed for continued dry sinuses. Follow up with [name] as is already scheduled in November.  Note your daily symptoms > remember "red flags" for COPD:   Increase in cough, increase in sputum production, increase in shortness of breath or activity intolerance. If you notice these symptoms, please call to be seen.    Please contact office for sooner follow up if symptoms do not improve or worsen or seek emergency care     Magdalen Spatz, NP 10/01/2020  10:41 AM

## 2020-10-02 ENCOUNTER — Other Ambulatory Visit: Payer: Self-pay | Admitting: Family Medicine

## 2020-10-02 DIAGNOSIS — R262 Difficulty in walking, not elsewhere classified: Secondary | ICD-10-CM | POA: Diagnosis not present

## 2020-10-02 DIAGNOSIS — Z1231 Encounter for screening mammogram for malignant neoplasm of breast: Secondary | ICD-10-CM

## 2020-10-02 DIAGNOSIS — M79604 Pain in right leg: Secondary | ICD-10-CM | POA: Diagnosis not present

## 2020-10-02 DIAGNOSIS — M25651 Stiffness of right hip, not elsewhere classified: Secondary | ICD-10-CM | POA: Diagnosis not present

## 2020-10-02 DIAGNOSIS — M6281 Muscle weakness (generalized): Secondary | ICD-10-CM | POA: Diagnosis not present

## 2020-10-02 DIAGNOSIS — S7011XD Contusion of right thigh, subsequent encounter: Secondary | ICD-10-CM | POA: Diagnosis not present

## 2020-10-03 DIAGNOSIS — J449 Chronic obstructive pulmonary disease, unspecified: Secondary | ICD-10-CM | POA: Diagnosis not present

## 2020-10-04 DIAGNOSIS — I10 Essential (primary) hypertension: Secondary | ICD-10-CM | POA: Diagnosis not present

## 2020-10-04 DIAGNOSIS — R262 Difficulty in walking, not elsewhere classified: Secondary | ICD-10-CM | POA: Diagnosis not present

## 2020-10-04 DIAGNOSIS — M6281 Muscle weakness (generalized): Secondary | ICD-10-CM | POA: Diagnosis not present

## 2020-10-04 DIAGNOSIS — S7011XD Contusion of right thigh, subsequent encounter: Secondary | ICD-10-CM | POA: Diagnosis not present

## 2020-10-04 DIAGNOSIS — M797 Fibromyalgia: Secondary | ICD-10-CM | POA: Diagnosis not present

## 2020-10-04 DIAGNOSIS — E538 Deficiency of other specified B group vitamins: Secondary | ICD-10-CM | POA: Diagnosis not present

## 2020-10-04 DIAGNOSIS — R5383 Other fatigue: Secondary | ICD-10-CM | POA: Diagnosis not present

## 2020-10-04 DIAGNOSIS — M79604 Pain in right leg: Secondary | ICD-10-CM | POA: Diagnosis not present

## 2020-10-04 DIAGNOSIS — M25651 Stiffness of right hip, not elsewhere classified: Secondary | ICD-10-CM | POA: Diagnosis not present

## 2020-10-05 DIAGNOSIS — K219 Gastro-esophageal reflux disease without esophagitis: Secondary | ICD-10-CM | POA: Diagnosis not present

## 2020-10-05 DIAGNOSIS — K5900 Constipation, unspecified: Secondary | ICD-10-CM | POA: Diagnosis not present

## 2020-10-09 DIAGNOSIS — S7011XD Contusion of right thigh, subsequent encounter: Secondary | ICD-10-CM | POA: Diagnosis not present

## 2020-10-09 DIAGNOSIS — M6281 Muscle weakness (generalized): Secondary | ICD-10-CM | POA: Diagnosis not present

## 2020-10-09 DIAGNOSIS — M25651 Stiffness of right hip, not elsewhere classified: Secondary | ICD-10-CM | POA: Diagnosis not present

## 2020-10-09 DIAGNOSIS — M79604 Pain in right leg: Secondary | ICD-10-CM | POA: Diagnosis not present

## 2020-10-09 DIAGNOSIS — R262 Difficulty in walking, not elsewhere classified: Secondary | ICD-10-CM | POA: Diagnosis not present

## 2020-10-11 DIAGNOSIS — R262 Difficulty in walking, not elsewhere classified: Secondary | ICD-10-CM | POA: Diagnosis not present

## 2020-10-11 DIAGNOSIS — M79604 Pain in right leg: Secondary | ICD-10-CM | POA: Diagnosis not present

## 2020-10-11 DIAGNOSIS — M25651 Stiffness of right hip, not elsewhere classified: Secondary | ICD-10-CM | POA: Diagnosis not present

## 2020-10-11 DIAGNOSIS — S7011XD Contusion of right thigh, subsequent encounter: Secondary | ICD-10-CM | POA: Diagnosis not present

## 2020-10-11 DIAGNOSIS — M6281 Muscle weakness (generalized): Secondary | ICD-10-CM | POA: Diagnosis not present

## 2020-10-16 DIAGNOSIS — M79604 Pain in right leg: Secondary | ICD-10-CM | POA: Diagnosis not present

## 2020-10-16 DIAGNOSIS — S7011XD Contusion of right thigh, subsequent encounter: Secondary | ICD-10-CM | POA: Diagnosis not present

## 2020-10-16 DIAGNOSIS — M25651 Stiffness of right hip, not elsewhere classified: Secondary | ICD-10-CM | POA: Diagnosis not present

## 2020-10-16 DIAGNOSIS — M6281 Muscle weakness (generalized): Secondary | ICD-10-CM | POA: Diagnosis not present

## 2020-10-16 DIAGNOSIS — R262 Difficulty in walking, not elsewhere classified: Secondary | ICD-10-CM | POA: Diagnosis not present

## 2020-10-22 ENCOUNTER — Telehealth: Payer: Self-pay | Admitting: Internal Medicine

## 2020-10-23 DIAGNOSIS — R262 Difficulty in walking, not elsewhere classified: Secondary | ICD-10-CM | POA: Diagnosis not present

## 2020-10-23 DIAGNOSIS — S7011XD Contusion of right thigh, subsequent encounter: Secondary | ICD-10-CM | POA: Diagnosis not present

## 2020-10-23 DIAGNOSIS — M25651 Stiffness of right hip, not elsewhere classified: Secondary | ICD-10-CM | POA: Diagnosis not present

## 2020-10-23 DIAGNOSIS — M79604 Pain in right leg: Secondary | ICD-10-CM | POA: Diagnosis not present

## 2020-10-23 DIAGNOSIS — M6281 Muscle weakness (generalized): Secondary | ICD-10-CM | POA: Diagnosis not present

## 2020-10-23 NOTE — Telephone Encounter (Signed)
I have not seen any pt assistance paperwork on pt.

## 2020-10-23 NOTE — Telephone Encounter (Signed)
Noted.   EP please advise if you have a patient assistance application for this patient. No application found in pod or upfront. Thanks.

## 2020-10-23 NOTE — Telephone Encounter (Signed)
LMTCB with patient

## 2020-10-25 DIAGNOSIS — M6281 Muscle weakness (generalized): Secondary | ICD-10-CM | POA: Diagnosis not present

## 2020-10-25 DIAGNOSIS — S7011XD Contusion of right thigh, subsequent encounter: Secondary | ICD-10-CM | POA: Diagnosis not present

## 2020-10-25 DIAGNOSIS — R262 Difficulty in walking, not elsewhere classified: Secondary | ICD-10-CM | POA: Diagnosis not present

## 2020-10-25 DIAGNOSIS — M79604 Pain in right leg: Secondary | ICD-10-CM | POA: Diagnosis not present

## 2020-10-25 DIAGNOSIS — M25651 Stiffness of right hip, not elsewhere classified: Secondary | ICD-10-CM | POA: Diagnosis not present

## 2020-10-26 NOTE — Telephone Encounter (Signed)
Spoke with the pt  She states that she spoke with [facility] yesterday bc she felt the forms may have been lost,  and they should be dropping off more forms today for pt assistance  Will forward to [place] to keep an eye out for these, thanks

## 2020-10-30 DIAGNOSIS — R262 Difficulty in walking, not elsewhere classified: Secondary | ICD-10-CM | POA: Diagnosis not present

## 2020-10-30 DIAGNOSIS — S7011XD Contusion of right thigh, subsequent encounter: Secondary | ICD-10-CM | POA: Diagnosis not present

## 2020-10-30 DIAGNOSIS — M25651 Stiffness of right hip, not elsewhere classified: Secondary | ICD-10-CM | POA: Diagnosis not present

## 2020-10-30 DIAGNOSIS — M6281 Muscle weakness (generalized): Secondary | ICD-10-CM | POA: Diagnosis not present

## 2020-10-30 DIAGNOSIS — M79604 Pain in right leg: Secondary | ICD-10-CM | POA: Diagnosis not present

## 2020-10-30 NOTE — Telephone Encounter (Signed)
Will keep an eye out for forms.

## 2020-11-01 DIAGNOSIS — S7011XD Contusion of right thigh, subsequent encounter: Secondary | ICD-10-CM | POA: Diagnosis not present

## 2020-11-01 DIAGNOSIS — M79604 Pain in right leg: Secondary | ICD-10-CM | POA: Diagnosis not present

## 2020-11-01 DIAGNOSIS — R262 Difficulty in walking, not elsewhere classified: Secondary | ICD-10-CM | POA: Diagnosis not present

## 2020-11-01 DIAGNOSIS — M25651 Stiffness of right hip, not elsewhere classified: Secondary | ICD-10-CM | POA: Diagnosis not present

## 2020-11-01 DIAGNOSIS — M6281 Muscle weakness (generalized): Secondary | ICD-10-CM | POA: Diagnosis not present

## 2020-11-02 NOTE — Telephone Encounter (Signed)
I still have not received any pt assistance paperwork on pt.  Attempted to call home number but the line just rang busy. Attempted to call pt on mobile number but unable to reach. Left message for her to return call.

## 2020-11-03 DIAGNOSIS — J449 Chronic obstructive pulmonary disease, unspecified: Secondary | ICD-10-CM | POA: Diagnosis not present

## 2020-11-06 ENCOUNTER — Other Ambulatory Visit: Payer: Self-pay

## 2020-11-06 ENCOUNTER — Ambulatory Visit
Admission: RE | Admit: 2020-11-06 | Discharge: 2020-11-06 | Disposition: A | Discharge status: Home / Self Care | Payer: Medicare Other | Source: Ambulatory Visit | Attending: Family Medicine | Admitting: Family Medicine

## 2020-11-06 DIAGNOSIS — M79604 Pain in right leg: Secondary | ICD-10-CM | POA: Diagnosis not present

## 2020-11-06 DIAGNOSIS — Z1231 Encounter for screening mammogram for malignant neoplasm of breast: Secondary | ICD-10-CM

## 2020-11-06 DIAGNOSIS — M25651 Stiffness of right hip, not elsewhere classified: Secondary | ICD-10-CM | POA: Diagnosis not present

## 2020-11-06 DIAGNOSIS — S7011XD Contusion of right thigh, subsequent encounter: Secondary | ICD-10-CM | POA: Diagnosis not present

## 2020-11-06 DIAGNOSIS — R262 Difficulty in walking, not elsewhere classified: Secondary | ICD-10-CM | POA: Diagnosis not present

## 2020-11-06 DIAGNOSIS — M6281 Muscle weakness (generalized): Secondary | ICD-10-CM | POA: Diagnosis not present

## 2020-11-08 DIAGNOSIS — S7011XD Contusion of right thigh, subsequent encounter: Secondary | ICD-10-CM | POA: Diagnosis not present

## 2020-11-08 DIAGNOSIS — R262 Difficulty in walking, not elsewhere classified: Secondary | ICD-10-CM | POA: Diagnosis not present

## 2020-11-08 DIAGNOSIS — M79604 Pain in right leg: Secondary | ICD-10-CM | POA: Diagnosis not present

## 2020-11-08 DIAGNOSIS — M6281 Muscle weakness (generalized): Secondary | ICD-10-CM | POA: Diagnosis not present

## 2020-11-08 DIAGNOSIS — M25651 Stiffness of right hip, not elsewhere classified: Secondary | ICD-10-CM | POA: Diagnosis not present

## 2020-11-09 NOTE — Telephone Encounter (Signed)
Forms still have not been received. Attempted to call pt to speak with pt about this but unable to reach. Left message for her to return call. Due to multiple attempts trying to reach pt and unable to do so, encounter will be closed.

## 2020-11-16 ENCOUNTER — Telehealth: Payer: Self-pay | Admitting: Internal Medicine

## 2020-11-16 NOTE — Telephone Encounter (Signed)
ATC LVMTCB x 1  

## 2020-11-19 MED ORDER — TRELEGY ELLIPTA 100-62.5-25 MCG/INH IN AEPB: 1.0000 | INHALATION_SPRAY | Freq: Every day | RESPIRATORY_TRACT | 3 refills | Status: DC

## 2020-11-19 NOTE — Telephone Encounter (Signed)
Called and spoke with patient. She stated that she has been trying to apply for patient assistance for Trelegy and her PCP's office has been helping her. They had supposedly dropped off paperwork last month but our office never received it. They are now asking for a printed RX.   I offered to fax the RX for her but she wishes to come by the office to pick up the RX. Advised her that I would go ahead and leave the RX at the front desk for her, she verbalized understanding.   RX has been printed and stamped MR's signature on it. Nothing further needed at time of call.

## 2020-11-19 NOTE — Telephone Encounter (Signed)
Patient is returning phone call. Patient phone number is [phone].

## 2020-11-19 NOTE — Telephone Encounter (Signed)
ATC, left VM. 

## 2020-11-19 NOTE — Telephone Encounter (Signed)
Patient is returning phone call. Patient phone number is [phone].

## 2020-11-21 DIAGNOSIS — J449 Chronic obstructive pulmonary disease, unspecified: Secondary | ICD-10-CM | POA: Diagnosis not present

## 2020-11-21 DIAGNOSIS — G8929 Other chronic pain: Secondary | ICD-10-CM | POA: Diagnosis not present

## 2020-11-21 DIAGNOSIS — K219 Gastro-esophageal reflux disease without esophagitis: Secondary | ICD-10-CM | POA: Diagnosis not present

## 2020-11-21 DIAGNOSIS — E1140 Type 2 diabetes mellitus with diabetic neuropathy, unspecified: Secondary | ICD-10-CM | POA: Diagnosis not present

## 2020-11-21 DIAGNOSIS — I2510 Atherosclerotic heart disease of native coronary artery without angina pectoris: Secondary | ICD-10-CM | POA: Diagnosis not present

## 2020-11-21 DIAGNOSIS — E1169 Type 2 diabetes mellitus with other specified complication: Secondary | ICD-10-CM | POA: Diagnosis not present

## 2020-11-21 DIAGNOSIS — I10 Essential (primary) hypertension: Secondary | ICD-10-CM | POA: Diagnosis not present

## 2020-11-21 DIAGNOSIS — E039 Hypothyroidism, unspecified: Secondary | ICD-10-CM | POA: Diagnosis not present

## 2020-11-21 DIAGNOSIS — G43009 Migraine without aura, not intractable, without status migrainosus: Secondary | ICD-10-CM | POA: Diagnosis not present

## 2020-11-21 DIAGNOSIS — E7800 Pure hypercholesterolemia, unspecified: Secondary | ICD-10-CM | POA: Diagnosis not present

## 2020-11-21 DIAGNOSIS — E118 Type 2 diabetes mellitus with unspecified complications: Secondary | ICD-10-CM | POA: Diagnosis not present

## 2020-12-03 DIAGNOSIS — J449 Chronic obstructive pulmonary disease, unspecified: Secondary | ICD-10-CM | POA: Diagnosis not present

## 2020-12-06 DIAGNOSIS — I2510 Atherosclerotic heart disease of native coronary artery without angina pectoris: Secondary | ICD-10-CM | POA: Diagnosis not present

## 2020-12-06 DIAGNOSIS — E7800 Pure hypercholesterolemia, unspecified: Secondary | ICD-10-CM | POA: Diagnosis not present

## 2020-12-06 DIAGNOSIS — E039 Hypothyroidism, unspecified: Secondary | ICD-10-CM | POA: Diagnosis not present

## 2020-12-06 DIAGNOSIS — E118 Type 2 diabetes mellitus with unspecified complications: Secondary | ICD-10-CM | POA: Diagnosis not present

## 2020-12-06 DIAGNOSIS — E1140 Type 2 diabetes mellitus with diabetic neuropathy, unspecified: Secondary | ICD-10-CM | POA: Diagnosis not present

## 2020-12-06 DIAGNOSIS — I10 Essential (primary) hypertension: Secondary | ICD-10-CM | POA: Diagnosis not present

## 2020-12-06 DIAGNOSIS — E1169 Type 2 diabetes mellitus with other specified complication: Secondary | ICD-10-CM | POA: Diagnosis not present

## 2020-12-06 DIAGNOSIS — G43009 Migraine without aura, not intractable, without status migrainosus: Secondary | ICD-10-CM | POA: Diagnosis not present

## 2020-12-06 DIAGNOSIS — G8929 Other chronic pain: Secondary | ICD-10-CM | POA: Diagnosis not present

## 2020-12-06 DIAGNOSIS — K219 Gastro-esophageal reflux disease without esophagitis: Secondary | ICD-10-CM | POA: Diagnosis not present

## 2020-12-06 DIAGNOSIS — J449 Chronic obstructive pulmonary disease, unspecified: Secondary | ICD-10-CM | POA: Diagnosis not present

## 2020-12-17 DIAGNOSIS — E063 Autoimmune thyroiditis: Secondary | ICD-10-CM | POA: Diagnosis not present

## 2020-12-17 DIAGNOSIS — E559 Vitamin D deficiency, unspecified: Secondary | ICD-10-CM | POA: Diagnosis not present

## 2020-12-18 LAB — TSH: TSH: 1.4 mIU/L (ref 0.40–4.50)

## 2020-12-18 LAB — T3, FREE: T3, Free: 2.7 pg/mL (ref 2.3–4.2)

## 2020-12-18 LAB — PTH, INTACT AND CALCIUM
Calcium: 9.7 mg/dL (ref 8.6–10.4)
PTH: 44 pg/mL (ref 16–77)

## 2020-12-18 LAB — VITAMIN D 25 HYDROXY (VIT D DEFICIENCY, FRACTURES): Vit D, 25-Hydroxy: 54 ng/mL (ref 30–100)

## 2020-12-18 LAB — T4, FREE: Free T4: 1.2 ng/dL (ref 0.8–1.8)

## 2020-12-18 NOTE — Progress Notes (Signed)
Subjective:  Patient Name: Shelby Lin Date of Birth: 1940/04/25  MRN: [id number]  Shelby Lin  presents to the office today for follow-up of her hypothyroidism secondary to Hashimoto's disease, questionable nodular goiter, tachycardia, tremor, dyspepsia, fatigue, fibromyalgia, hyperlipidemia, night sweats, tremor, and pre-diabetes.  HISTORY OF PRESENT ILLNESS:   Shelby Lin is a 80 y.o. Caucasian woman.  Shelby Lin was unaccompanied.  1. Shelby Lin was referred to me on 03/25/2005 by her primary care provider, Dr. Carol Ada, for evaluation of her thyroid problems. Shelby Lin was 53 at that time.   A. At about age 72 the patient developed thyroid problems and was put on a thyroid medicine. Later when she became pregnant her obstetrician took her off that medicine. At about age 84, she developed severe fatigue and weight gain. She was put back on Synthroid. Her dose of Synthroid was 88 mcg per day for many years. In January 2006, she had fluctuations in her thyroid function tests. Synthroid was increased to 100 mcg and later to 125 mcg per day. The Synthroid dose was then reduced to 75 mcg. After blood tests about 9 months prior to her first appointment with me, the patient was put back on a dose of 88 mcg per day. When the patient was having fluctuations in her thyroid tests, her thyroid gland became visibly enlarged and felt full and tender to palpation. She was also more hoarse during those periods. Hoarseness came and went in parallel with the thyroid gland swelling. Ultrasound studies of the thyroid in January 2006 and January 2007 showed a multinodular goiter.   B. Thyroid function tests performed at that first visit showed a TSH of 4.139, free T4 1.22, and free T3 of 2.7. Because any TSH at that time greater than 3.0 was considered to be elevated physiologically, I increased her Synthroid from 88 to 100 mcg per day. TPO antibody was markedly positive at 954.0, c/w the diagnosis of Hashimoto's  thyroiditis. Her TSI level was quite normal at 0.9.   2. Clinical course: During the last 15 years the patient has had several medical issues that we have followed:  A. She has had several flare-ups of Hashimoto's disease, resulting in several changes in her doses of Synthroid. Her Synthroid dose was subsequently decreased to 88 mcg/day in January 2018.   B. Although she was initially thought to have a multinodular goiter, over time it became clear that all but one of the nodules were actually areas of echotexture heterogeneity due to Hashimoto's thyroiditis. She has had one "nodularish" area of her left inferior pole that has remained unchanged since 2011. This area is ill-defined and may also just represent several overlapping areas of heterogeneity.  C. She has also had problems with fibromyalgia, arthritis, hypertension, depression, fatigue, tremor, hyperlipidemia, reflux, dyspepsia, and vitamin D deficiency.   D. In August 2016 her HbA1c was 6.2%, c/w prediabetes. Since hen her HbA1c values have varied from 5.8% to 6.4%. The patient refused to take metformin because three of her relatives lost their scalp hair when they took metformin.    E. She had normal colonoscopy in January 2022.    F. She had bilateral cataractectomies in March and April 2022. Her vision is good now.  G. She saw her cardiologist, Dr. Candee Furbish, MD, in April 2022. Her nuclear stress test was normal.    3. The patient's last [facility] visit was on 08/15/20. At that visit I continued her Synthroid dose of 88 mcg/day for 6 days each week,  but only 1/2 tablet on the 7th days and continued her MVI and Biotech. I changed her omeprazole to rabeprazole.   A. In the interim she has been healthy.   1). She fell through the flooring on a deck on the 4th of July 2022 injuring both legs. She had PT for about 16 visits. She continues to have severe pain in her right lateral thigh, but somewhat less over time. She now uses a cane because it  makes her feel more secure.    2). She has not had any other health issues or changes in medications.   B. She still has chronic constipation.  Linzess caused diarrhea, so she now takes a stool softener daily and prunes.   C. Her Meniere's disease has been quiescent recently. She has not had any vertigo or ringing and roaring in her ears recently. She has not had any other falls.     D. Her COPD is about the same. When she wears a mask it feels to her as if she is having more difficulty breathing, especially if she is more physically active, especially in the humid weather. She is not having many allergy problems now.      E. Her tension headaches are "about the same". Cervical stretching helps when she does her cervical stretching exercises regularly.   F. She is always tired. Her energy has been "a little bit low". She says that she sleeps well. Her husband no longer complains about her snoring. She has not awakened gasping for breath.     G. She still has nocturnal hot flashes and night sweats occasionally.     H. She is doing "well' emotionally.  Her family problems are unchanged.      I. She continues to have problems with fibromyalgia and arthritis in her wrists, hands, knees, and feet, worse in the cold weather.   H. Her feet are doing better since having [employer] therapy. .     I. She is hoarse sometimes, but not a lot. The hoarseness still comes and goes in association with her allergies.   J. She still has acid indigestion almost every night. She remains on her Prilosec, 20 mg twice daily. She could not afford the copay for rabeprazole. She still takes Synthroid, 88 mcg/day for 6 days each week, but only 1/2 tablet on Sundays. She also takes losartan, 100 mg/day, an MVI and Biotech vitamin D, 50,000 IU/weekly. Her cardiologist increased her rosuvastatin to 20 mg/day.   K. She has not had sweet tea very often. She has reduced her rice and bread intake.     4. Pertinent Review of Systems:   Constitutional: "I feel alright." Some days are better than others.  Her stamina and energy are about the same. The SOB is about the same.  Eyes: As above. Vision is good as long as she wears her glasses.  Neck: The thyroid gland has not seemed to swell lately. The patient has no complaints of anterior neck soreness, tenderness,  pressure, discomfort, or difficulty swallowing.  Heart: She has not had any other chest symptoms. Heart rate increases with exercise or other physical activity. The patient has no other complaints of palpitations, irregular heat beats, chest pain, or chest pressure. Gastrointestinal: As above. She also sometimes feels like food sticks in her esophagus or that she has a spasm of the esophagus  Hands: She still has her tremor.  Legs: As above. Muscle mass and strength seem normal. Legs are stronger when she  walks more. She no longer has episodic leg numbness and burning. She has had some edema occasionally, more in the left leg.  Feet: Her feet still burn in the forefeet at times.    GYN: She still gets hot flashes, but much less frequently and much less intensely.    Neuro: Her neuropathy in her feet is about the same.  Emotional/psychiatric: She feels "great". Mental: She does "well" at thinking, paying attention, remembering, and making decisions. "I still have all my marbles." She remembers to tell me that same sentence every time she visits.    PAST MEDICAL, FAMILY, AND SOCIAL HISTORY:  Past Medical History:  Diagnosis Date   Anxiety    Arthritis    Asthma in child    Atherosclerotic heart disease of native coronary artery without angina pectoris    Cataracts, bilateral    Combined hyperlipidemia    Constipation, unspecified    COPD (chronic obstructive pulmonary disease) (HCC)    Coronary artery spasm (HCC)    Diabetic neuropathy (HCC)    DM (diabetes mellitus) with complications (HCC)    Dupuytren's contracture    Dyspepsia    Familial tremor    Fatigue     Fibromyalgia syndrome    GERD (gastroesophageal reflux disease)    Hashimoto's thyroiditis    Hiatal hernia    History of colon polyps    Hx of Hashimoto thyroiditis    Hypercholesteremia    Hypertension    Hypothyroidism, acquired, autoimmune    IBS (irritable bowel syndrome)    Meniere's disease of left ear    Migraine without aura and without status migrainosus, not intractable    Multinodular goiter (nontoxic)    Nonexudative macular degeneration    Raynaud's syndrome without gangrene    Situational stress    Tachycardia    Thyroiditis, autoimmune    Ulcerative colitis, unspecified, without complications ([place])    Vitamin B 12 deficiency    Vitamin D deficiency    Wears glasses     Family History  Problem Relation Age of Onset   Cancer Mother    Hypertension Mother    Stroke Mother    Heart attack Father    Sudden death Father    Hypertension Sister    Diabetes Sister    Stroke Sister    Cancer Maternal Uncle        breast,    Cancer Paternal Aunt        breast, ovarian   Thyroid disease Neg Hx      Current Outpatient Medications:    albuterol (VENTOLIN HFA) 108 (90 Base) MCG/ACT inhaler, Inhale 1-2 puffs into the lungs every 6 (six) hours as needed for wheezing or shortness of breath., Disp: 18 g, Rfl: 6   aspirin EC 81 MG tablet, Take 1 tablet (81 mg total) by mouth daily., Disp: , Rfl:    benzonatate (TESSALON) 100 MG capsule, Take 100 mg by mouth 3 (three) times daily as needed., Disp: , Rfl:    BESIVANCE 0.6 % SUSP, Place 1 drop into the right eye 3 (three) times daily., Disp: , Rfl:    Cyanocobalamin (VITAMIN B 12 PO), Take by mouth daily., Disp: , Rfl:    doxycycline (VIBRA-TABS) 100 MG tablet, Take 1 tablet (100 mg total) by mouth 2 (two) times daily., Disp: 14 tablet, Rfl: 0   DULoxetine (CYMBALTA) 30 MG capsule, Take 30 mg by mouth 2 (two) times daily., Disp: , Rfl:    Fluticasone-Umeclidin-Vilant (TRELEGY ELLIPTA) 100-62.5-25  MCG/INH AEPB, Inhale 1  puff into the lungs daily., Disp: 180 each, Rfl: 3   furosemide (LASIX) 20 MG tablet, Take 20 mg by mouth as needed., Disp: , Rfl:    hydrochlorothiazide (HYDRODIURIL) 25 MG tablet, TAKE 1 TABLET BY MOUTH EVERY DAY IN THE MORNING, Disp: , Rfl: 1   levothyroxine (SYNTHROID) 88 MCG tablet, Take 1 tablet (88 mcg total) by mouth daily before breakfast. 1 tablet  6 days each week, but take only 1/2 pill on the 7th day, Disp: 30 tablet, Rfl: 5   LORazepam (ATIVAN) 0.5 MG tablet, TAKE 1/2 TAB EVERY 8 12 HOURS AS NEEDED FOR ANXIOUSNESS, Disp: , Rfl:    losartan (COZAAR) 100 MG tablet, Take 100 mg by mouth daily., Disp: , Rfl: 1   meclizine (ANTIVERT) 25 MG tablet, 1/2 to 1 tablet by mouth every 8 hrs as needed for diziness, Disp: , Rfl:    Multiple Vitamin (MULTIVITAMIN) tablet, Take 1 tablet by mouth daily., Disp: , Rfl:    Multiple Vitamins-Minerals (PRESERVISION AREDS) TABS, See admin instructions., Disp: , Rfl:    omeprazole (PRILOSEC) 20 MG capsule, Take 20 mg by mouth daily., Disp: , Rfl:    prednisoLONE acetate (PRED FORTE) 1 % ophthalmic suspension, Place 1 drop into the right eye 4 (four) times daily., Disp: , Rfl:    PROLENSA 0.07 % SOLN, Place 1 drop into the right eye at bedtime., Disp: , Rfl:    RABEprazole (ACIPHEX) 20 MG tablet, Take one tablet twice daily., Disp: 180 tablet, Rfl: 3   Respiratory Therapy Supplies (FLUTTER) DEVI, Use device 2-3 times a day to break up congestion, Disp: 1 each, Rfl: 0   rosuvastatin (CRESTOR) 20 MG tablet, Take 1 tablet (20 mg total) by mouth daily., Disp: 90 tablet, Rfl: 3   VITAMIN D, CHOLECALCIFEROL, PO, Take 50,000 Units by mouth once a week. , Disp: , Rfl:   Allergies as of 12/19/2020 - Review Complete 10/01/2020  Allergen Reaction Noted   Acyclovir and related  04/01/2017   Allegra [fexofenadine] Hives 04/28/2018   Amoxicillin Hives 06/18/2010   Codeine Nausea And Vomiting 02/21/2013   Elemental sulfur Hives 09/08/2016   Enalapril maleate Cough  04/28/2018   Hydrocodone bit-homatrop mbr Other (See Comments) 04/06/2020   Keflex [cephalexin] Hives 04/28/2018   Levofloxacin Other (See Comments) 04/28/2018   Lorabid [loracarbef] Hives 06/18/2010   Other Other (See Comments) 04/06/2020   Sulfa antibiotics Hives 06/18/2010    1. Work and Family: She retired in June 2013, but later went back to work part-time. She stopped part-time work in May 2020. She has not been as busy during the pandemic. Her husband retired at the same time.    2. Activities: She has been walking much outside.   3. Smoking, alcohol, or drugs: None 4. Primary Care Provider: Dr. Carol Ada, [facility] [place]: There are no other significant problems involving Alta's other body systems.   Objective:  Vital Signs:  BP 110/78 (BP Location: Right Arm, Patient Position: Sitting, Cuff Size: Normal)   Pulse 78   Wt 159 lb 3.2 oz (72.2 kg)   BMI 26.90 kg/m   Wt Readings from Last 3 Encounters:  12/19/20 159 lb 3.2 oz (72.2 kg)  10/01/20 156 lb 3.2 oz (70.9 kg)  09/10/20 153 lb 6.4 oz (69.6 kg)    Ht Readings from Last 3 Encounters:  10/01/20 5' 4.5" (1.638 m)  09/10/20 5\' 4"  (1.626 m)  06/12/20 5\' 4"  (  1.626 m)    HC Readings from Last 3 Encounters:  No data found for [facility]   Facility age limit for growth percentiles is 20 years.  Body mass index is 26.9 kg/m. Facility age limit for growth percentiles is 20 years.  Body surface area is 1.81 meters squared.  PHYSICAL EXAMINATION  Constitutional: Mikayela looks good today. She is alert, bright, and upbeat. Her affect and insight are normal. Her weight has increased 4 pounds since her last visit. She looks much younger.  Eyes: There is no obvious arcus or proptosis. Moisture appears normal. Mouth: The oropharynx and tongue appear normal. Oral moisture is normal. There is no oral hyperpigmentation. Neck: The neck appears to be visibly normal. No carotid bruits are noted.  The thyroid gland is again within normal limits of 18-20 grams in size. The thyroid gland is not tender to palpation. Lungs: The lungs are clear to auscultation. Air movement is fair.   Heart: Heart rate and rhythm are regular. Heart sounds S1 and S2 are normal. I did not appreciate any pathologic cardiac murmurs. Abdomen: The abdomen is enlarged. Bowel sounds are normal. There is no obvious hepatomegaly, splenomegaly, or other mass effect. The abdomen is not tender to palpation. Arms: Muscle size and bulk are normal for age.  Hands: There is a 1+ tremor. Phalangeal and metacarpophalangeal joints are normal. Palmar muscles are low-normal. Palmar moisture is normal. There is no palmar erythema. Palms are warm. Legs: Muscles appear normal for age. No edema is present.  Neurologic: Strength is normal for age in both the upper and lower extremities. Muscle tone is normal. Sensation to touch is normal in the legs and feet.   LAB DATA:   Labs 12/19/20: HbA1c 6.0%, CBG 122  Labs 12/17/20: TSH 1.40, free T4 1.2, free T3 2.7; PTH 44, calcium 9.7, 25-OH vitamin D 54  Labs 08/15/20: HbA1c 5.8%, CBG 118  Labs 08/10/20: TSH 2.69, free T4 1.3, free T3 2.8; PTH 35 (ref 16-77), calcium 9.7, 25-OH vitamin D 61;  Labs 04/17/20: HbA1c 6.3%, CBG 128  Labs 01/10/20: HbA1c 6.3%, CBG 129; TSH 0.80, free T4 1.6, free T3 3.1; PTH 31, calcium 9.8, 25-OH vitamin D 64  Labs 10/10/19; HbA1c 6.5%, CBG 132  Labs 10/05/19: TSH 1.26, free 4 1.4, free T3 3.3; PTH 20, calcium 10.0, 25-OH vitamin D 46  Labs 04/11/19: HbA1c 6.3%, CBG 139  Labs 04/01/19: TSH 1.77, free T4  1.3, free T3 3.1  Labs 01/11/19:; HbA1c 6.2%; TSH 2.72, free T4 1.2, free T3 3.0; PTH 24, calcium 10.0, 25-OH vitamin D 58  Labs 10/05/18: HbA1c 6.3%; TSH 0.34, free T4 1.2, free T3 3.0; PTH 18, calcium 9.9, 25-OH vitamin D 43  Labs 04/09/18: HbA1c 6.4%, CBG 115  Labs 04/08/18: TSH 1.02, free T4 1.4, free T3 2.4; PTH 16, calcium 9.5, 25-OH vitamin D  45  Labs 10/07/17: HbA1c 6.2%, CBG 135  Labs 10/02/17: TSH 0.66, free T4 1.3, free T3.9; PTH 36, calcium 9.6, 25-OH vitamin D 53 2  Labs 04/20/17: HbA1c 6.0%, CBG 112  Labs 04/16/17: TSH 1.12, free 4 1.5, free T3 3.2; 25-OH vitamin D 55  Labs 10/13/16: HbA1c 6.1%, CBG 122  Labs 10/07/16: TSH 2.63, free T4 1.3, free T3 2.9; 25-OH vitamin D 47  Labs 06/05/16: HbA1c 5.8%, fasting glucose 120; TSH  1.40, free T4 1.1, free T3 2.7  Labs 02/22/16: HbA1c 6.4%, CBG 104, C-peptide 1.57 (ref 080-3.85); PTH 28, calcium 10, 25-OH vitamin D 45; TSH 0.16, free T4  1.3, free T3 2.8; CMP normal  Labs 11/11/15: TSH 0.05, free T4 1.5, free T3 3.6  Labs 08/14/15: HbA1c 5.8%  Labs 04/27/15: HbA1c 6.3%; TSH 1.04, free T4 1.3, free T3 2.8; PTH 45, calcium 9.4, 25-OH vitamin D 19  Labs 04/24/15: C-peptide 1.43 (normal 0.80-3.90)  Labs 12/04/14: HbA1c 6.1%  Labs 09/26/14 at 12:01 PM: CMP normal; HbA1c 6.2%;CBC normal, iron 81; ACTH 21, cortisol 12.9; vitamin B12 705 (normal 211-911)  09/19/14: TSH 1.640, free T4 1.03, free T3 2.6; Calcium 9.3, PTH 33 (increased from 26 in March), 25-OH vitamin D 21 (decreased from 23 in march)  04/28/14: TSH 0.081, free T4 1.47, free T3 3.3; calcium 9.4, PTH 26, 25-OH vitamin D 23  10/27/13: TSH 1.087, free T4 1.04, free T3 2.7; Calcium 9.6, PTH 32, 25-hydroxyvitamin D 34  05/18/13: TSH 0.052, free T4 1.24, free T3 3.2  04/28/13: TSH 0.426, free T4 1.24, free T3 3.2; calcium 9.7, 25-hydroxy vitamin D 36  10/08/12: TSH 2.106, free T4 1.05, free T3 2.6; 25-OH vitamin D 37, PTH 39.4, calcium 9.1  04/02/12: TSH 1.073, free T4 1.31, free T3 2.9, calcium 9.5, 25-hydroxy vitamin D 31 - PTH was ordered but not done. I have re-ordered it this morning.  09/30/11: TSH 8.095, free T4 1.16, free T3 2.4. PTH 24, calcium 9.3, 25-hydroxy vitamin D 33, 1,25-dihydroxy vitamin D 95 11/21/10: Calcium was 9.5. Her PTH was 37.6. 25-hydroxy vitamin D was 41. 1, 25-dihydroxy vitamin D was 85, which was  slightly elevated. B12 was 352 (211-911)    IMAGING:   12/07/14 US thyroid: Both lobes are smaller, with maximum dimensions <2.5 cm. Both lobes are heterogenous in echotexture. The previously seen exophytic, solid, hypoechoic, ill-defined nodule in the left inferior pole remains at 0.9 x 0.4 x 0.5 cm and is unchanged from 08/23/2009.   10/12/12: US thyroid gland: Small 10 x 4 x 5 mm nodule or adjacent lymph node or parathyroid adenoma. Diffuse echogenicity. As I read the [place], there is definitely a lot of echogenicity c/w Hashimoto's thyroiditis. There may be a small nodule that is actually a bit less than one cm in longest dimension. This is unchanged since June 2011.   Assessment and Plan:   ASSESSMENT:  1. Hypothyroid:   A. She has acquired hypothyroidism due to Hashimoto's thyroiditis. During the past 15 years her TFTs have been variable, requiring several changes in her Synthroid dosage. Some of the variability was due to flare ups of Hashimoto's disease causing Hashitoxicosis. We have tried to maintain her TSH in the ideal physiologic goal range of 1.0-2.0.  B. Her TFTs in February 2019 were in the middle of the real physiologic normal range on her current Synthroid dose. Her TFTs in August 2019 were more variable. Her TSH was lower, her free T4 was lower, and her free T3 was higher. This pattern is most c/w having had a recent  flare up of thyroiditis. In contrast, her TFTs in February 2020 were mid-normal.   C. At her August 2020 visit her TSH was low, her free T4 was lower, and her free T3 was higher. It was likely that she had been having another flare up of thyroiditis. It was possible, however, that her tremor was due to relatively high thyroid hormone levels. It was prudent to reduce her Synthroid dose a bit and repeat her lab tests in two months.   D. Her TFTs in August 2021 and October 2021 were in goal range, but she was still mildly  tremulous. Her TFTs in November 2021 were high-normal,  so I reduced her levothyroxine dose slightly. Her TFTs in June 2022 were normal at about the 20% of the physiologic range. Her TFTS in October 2022 were at about the 55% of the physiologic range. Her tremor is not caused by hyperthyroidism. .  2. Thyroiditis: The patient's Hashimoto's disease has usually been clinically quiescent, but was mildly Hashitoxic in the January-March 2016 time period. Her thyroid US in October 2016 showed the heterogeneity c/w Hashimoto's thyroiditis. As noted above, she appeared to have had a recent flare up of thyroiditis prior to her August 2019 visit and again prior to her August 2020 visit. Her thyroiditis is clinically quiescent again today.  3. Multinodular goiter:   A. The thyroid gland was again within normal limits for size at her February 2020 visit.   B. Her thyroid gland was borderline enlarged at her August 2020 visit, with the left lobe being a bit larger than the right. I dd not palpate any nodular area.   C. In October 2021, in February 2022, in June 2022, and again in October 2022  her thyroid gland is normal in size.   D. The waxing and waning of thyroid gland size is c/w evolving Hashimoto's disease.   E. Although some [place] studies have shown a nodule, her [place] study in June 2011 showed either a questionable nodule or demonstrated the echotexture heterogeneity that is commonly seen with Hashimoto's disease. Her [place] studies in August 2014 and October 2016 were essentially unchanged. She did not need a FNA at those times. 4. Headaches: Headaches appear to be "tension" headaches. They have significantly decreased in frequency and severity. She does better when she performs her cervical stretching exercises daily.   5. Hot flashes and sweats: These problems are chronic, but not debilitating.    6-7. Leg pains/vitamin D deficiency disease:   A. Her labs in July 2016 showed that her calcium was normal at 9.3, just below the 50%. Her PTH value was mid-range normal. Her  vitamin D was lower. She needed more calcium and vitamin D. I had previously asked her to take Citracal-D at doses of 1200-1500 mg of calcium per day and [phone] IU of vitamin D per day.   B. Her labs in March 2017 showed that her PTH value of 45 was higher, but still mid-normal. Calcium was a bit higher at 9.4. Vitamin D was lower at 19. At that point she agreed to purchase and take the Biotech form of vitamin D, one 50,000 IU capsule each week.   C. Her PTH, calcium and vitamin D were normal in December 2017, although the vitamin D had decreased.   D. She is now taking Biotech vitamin D, 50,000 units each week.  Her vitamin D levels were normal again in August 2018, in August 2019, in February 2020, in August 2020, in November 2020, in August 2021, and again in October 2021. Her PTH was lower in February 2020 and August 2020, slightly higher in November 2020, lower in August 2021 and October 2021, but appropriate to her calcium level. Her PTH, calcium, and vitamin D levels in June and in October 2022 were good  8. Meniere's disease: Her vertigo has been fairly quiescent, but she occasionally notes symptoms. . Her Meniere's disease has not bothered her very often in the past few months. Her hearing in the left ear varies.  9. Hoarseness: In the past, the fact that her hoarseness and post nasal drip  came and went essentially in parallel indicate that the hoarseness was likely due to flare ups of her allergies. In addition, several flare ups of Hashimoto's disease have also caused hoarseness in the past. Her hoarseness has not been bothering her much recently.  10. Dyspepsia: This problem is fairly well controled with Prilosec.    11. Hypertension: Her BP is normal today on her losartan.     13. Depression: Her affect and insight are very good today.   14. Fibromyalgia syndrome: This condition remains a problem for her, but seems to have worsened.  Exercise will help her "re-charge her mitochondrial  batteries".  15. Fatigue:  This problem has also worsened, generally paralleling her COPD. 16. Prediabetes:  A. Her HbA1c values in July and in October 2016 were elevated into the "pre-diabetes" range. Her HbA1c values have fluctuated over time depending upon her diet and her activity levels.  Her HbA1c today was lower today in November 2021 than it was in August, but still in the prediabetes range.  I asked her to tighten down on her carb intake. I instructed her on the Eat Right Diet and the [place] recipes.  B. Her HbA1c in June 2020 was still in the prediabetes range, but lower. Her HbA1c in October 2022 was a bit higher, c/w her not exercising much due to leg pain.  17. Arthritis: Her arthritis seems to be doing fairly well.   98. Overweight: Ms. Titus is more overweight. 19. Tremor: Her tremor has varied in the past and is about the same today. If the tremor worsens she may benefit from a referral to neurology. She has an uncle who had Parkinson's disease.   PLAN:  1. Diagnostic: Repeat labs prior to next visit: TFTs, PTH, calcium, 25-Oh vitamin D 2. Therapeutic: Try to do daily physical activity. Eat Right Diet and [facility] Diet recipes. Watch what she eats and drinks. Continue Synthroid dose of  88 mcg/day for 6 days each week, but take only 1/2 pill on the 7th days. Take MVI daily and [employer], 50,000 IU per week. Take one Tums at bedtime. Continue Prilosec.  3. Patient education: We again discussed the issues of prediabetes, headaches, Hashimoto's disease, hypothyroidism, fibromyalgia, fatigue, and COPD. We discussed what she needs to do to control her BGs, to include getting more exercise and following our Eat Right Diet. She knows what she needs to do to get her weight back under control. She is trying hard.  Unfortunately, her COPD, her injury, and the cold weather make that increasingly more difficult.    4. Follow-up: 6 months  Level of Service: This visit lasted in excess of  70 minutes. More than 50% of the visit was devoted to counseling.   Tillman Sers, MD, CDE Adult and Pediatric Endocrinology

## 2020-12-19 ENCOUNTER — Ambulatory Visit (INDEPENDENT_AMBULATORY_CARE_PROVIDER_SITE_OTHER): Payer: Medicare Other | Admitting: "Endocrinology

## 2020-12-19 ENCOUNTER — Encounter (INDEPENDENT_AMBULATORY_CARE_PROVIDER_SITE_OTHER): Payer: Self-pay | Admitting: "Endocrinology

## 2020-12-19 ENCOUNTER — Other Ambulatory Visit: Payer: Self-pay

## 2020-12-19 VITALS — BP 110/78 | HR 78 | Wt 159.2 lb

## 2020-12-19 DIAGNOSIS — E063 Autoimmune thyroiditis: Secondary | ICD-10-CM | POA: Diagnosis not present

## 2020-12-19 DIAGNOSIS — M797 Fibromyalgia: Secondary | ICD-10-CM | POA: Diagnosis not present

## 2020-12-19 DIAGNOSIS — E663 Overweight: Secondary | ICD-10-CM

## 2020-12-19 DIAGNOSIS — I10 Essential (primary) hypertension: Secondary | ICD-10-CM

## 2020-12-19 DIAGNOSIS — E559 Vitamin D deficiency, unspecified: Secondary | ICD-10-CM

## 2020-12-19 DIAGNOSIS — E782 Mixed hyperlipidemia: Secondary | ICD-10-CM

## 2020-12-19 DIAGNOSIS — R1013 Epigastric pain: Secondary | ICD-10-CM | POA: Diagnosis not present

## 2020-12-19 DIAGNOSIS — R000 Tachycardia, unspecified: Secondary | ICD-10-CM | POA: Diagnosis not present

## 2020-12-19 DIAGNOSIS — E049 Nontoxic goiter, unspecified: Secondary | ICD-10-CM | POA: Diagnosis not present

## 2020-12-19 DIAGNOSIS — R7303 Prediabetes: Secondary | ICD-10-CM | POA: Diagnosis not present

## 2020-12-19 DIAGNOSIS — R251 Tremor, unspecified: Secondary | ICD-10-CM

## 2020-12-19 DIAGNOSIS — R5383 Other fatigue: Secondary | ICD-10-CM

## 2020-12-19 LAB — POCT GLYCOSYLATED HEMOGLOBIN (HGB A1C): Hemoglobin A1C: 6 % — AB (ref 4.0–5.6)

## 2020-12-19 LAB — POCT GLUCOSE (DEVICE FOR HOME USE): POC Glucose: 122 mg/dl — AB (ref 70–99)

## 2020-12-19 NOTE — Patient Instructions (Addendum)
Follow up visit in 6 months.  ? ?At [facility], we are committed to providing exceptional care. You will receive a patient satisfaction survey through text or email regarding your visit today. Your opinion is important to me. Comments are appreciated. ? ?

## 2021-01-03 DIAGNOSIS — J449 Chronic obstructive pulmonary disease, unspecified: Secondary | ICD-10-CM | POA: Diagnosis not present

## 2021-01-08 ENCOUNTER — Encounter: Payer: Self-pay | Admitting: Internal Medicine

## 2021-01-08 ENCOUNTER — Other Ambulatory Visit: Payer: Self-pay

## 2021-01-08 ENCOUNTER — Ambulatory Visit: Payer: Medicare Other | Admitting: Internal Medicine

## 2021-01-08 ENCOUNTER — Ambulatory Visit (INDEPENDENT_AMBULATORY_CARE_PROVIDER_SITE_OTHER): Payer: Medicare Other | Admitting: Internal Medicine

## 2021-01-08 VITALS — BP 126/74 | HR 73 | Ht 64.0 in | Wt 159.6 lb

## 2021-01-08 DIAGNOSIS — J449 Chronic obstructive pulmonary disease, unspecified: Secondary | ICD-10-CM

## 2021-01-08 DIAGNOSIS — J439 Emphysema, unspecified: Secondary | ICD-10-CM

## 2021-01-08 DIAGNOSIS — G4736 Sleep related hypoventilation in conditions classified elsewhere: Secondary | ICD-10-CM | POA: Diagnosis not present

## 2021-01-08 LAB — PULMONARY FUNCTION TEST
DL/VA % pred: 88 %
DL/VA: 3.62 ml/min/mmHg/L
DLCO cor % pred: 57 %
DLCO cor: 10.82 ml/min/mmHg
DLCO unc % pred: 57 %
DLCO unc: 10.82 ml/min/mmHg
FEF 25-75 Pre: 0.38 L/sec
FEF2575-%Pred-Pre: 26 %
FEV1-%Pred-Pre: 48 %
FEV1-Pre: 0.95 L
FEV1FVC-%Pred-Pre: 66 %
FEV6-%Pred-Pre: 74 %
FEV6-Pre: 1.84 L
FEV6FVC-%Pred-Pre: 100 %
FVC-%Pred-Pre: 73 %
FVC-Pre: 1.93 L
Pre FEV1/FVC ratio: 49 %
Pre FEV6/FVC Ratio: 95 %

## 2021-01-08 NOTE — Progress Notes (Signed)
Spiro/Dlco done today. 

## 2021-01-08 NOTE — Patient Instructions (Addendum)
ICD-10-CM   1. Stage 3 severe COPD by GOLD classification (Interior)  J44.9   2. Nocturnal hypoxemia due to emphysema (HCC)  J43.9    G47.36    Stable Gold stage III COPD  - PFT stable x 3 years Glad you are up-to-date with all your vaccines  Plan -Continue Trelegy daily [CMA to refill] -Continue albuterol as needed [CMA to do refill) = Continue oxygen as needed - Best wishes for being great grandma in April 2023   Follow-up -Return in 9 months to see [name]   - CAT score and simple walk test at followup

## 2021-01-08 NOTE — Progress Notes (Signed)
IOV 02/11/2017  Chief Complaint  Patient presents with   Advice Only    Self referral for COPD. States she was dx by Dr. Carol Ada x2 years ago. Has SOB with exertion and anxiety and has an occ. cough. Denies any CP.   80 year old female with limited smoking history and quit many years ago.  She tells me for the last few years she has had insidious onset of shortness of breath that is mild to moderate in severity brought on with exertion relieved by rest.  It is stable overall.  Does not much of associated cough except except very mild.  She does have associated ACE inhibitor intake.  The dyspnea is stable.  A few years ago based on pulmonary function test done?  At [facility] location but I do not have the results patient was started on Spiriva.  There is a chest x-ray in the system from few to several years ago that shows hyperinflation personally visualized and confirmed the findings.  Few to several months ago was switched to incruse/.  She honestly does not know if the symptoms are helping her.  She denies any wheezing orthopnea proximal nocturnal dyspnea hemoptysis fever chills edema.  Some 6 weeks ago she describes what sounds like an asthma or COPD exacerbation treated with antibiotics and prednisone x2 and then back to baseline currently.  At this point in time she wants pulmonary support for her diagnosis of COPD.  Her current symptoms are detailed below.  Blood work August 2018 normal hemoglobin A1c and TSH  OV 04/01/2017  Chief Complaint  Patient presents with   Follow-up    PFT done today and HRCT done 02/11/17.  Pt states she has been doing good.  No real complaints of cough other than acid reflux.   Follow-up investigation for COPD.  Since her last visit she had CT scan of the chest that does show bronchiectasis associated with emphysema.  Pulmonary function test today shows Gold stage II COPD but with significant bronchodilator response.  She feels good and stable.   Cough is very minimal.  Med review shows that she is just on anticholinergic.  She is not on inhaled corticosteroid or bronchodilator.  I noticed that she is on ACE inhibitor.  She tells me that she can cough significantly after a viral infection.  However at baseline she only has mild cough.  She room was having asthma as a child but she says she outgrew it.  Incidental finding of 5 mm right middle lobe nodule in December 2018 CT chest  New issue of her having to undergo upper endoscopy by Dr. Oletta Lamas at [facility].  She has had colonoscopy a few years ago under propofol and she tolerated this fine.  She says gastroenterology is not aware of her COPD diagnosis and the severity of impairment of the lung function although she is quite functional and has tolerated sedation before.   OV 10/16/2017  Chief Complaint  Patient presents with   Follow-up    Pt states she has been having SOB due to the heat. Denies any cough or CP.    Follow-up Gold stage III COPD: Overall stable since last visit. In fact COPD cat score is improved significantly. It is only for an very minimal symptoms. This is on trouble inhaler therapy. She checked her inhaler technique with me and it looked good.She does she had a bad July because of the heat and humidity but she stated endorse. She is  grieving because she lost her little chihuahua at age 40 - [name] was his name     Alpha 1 - 129 and MM 02/18/18   Walking desaturation test on 02/11/2017 185 feet x 3 laps on ROOM AIR:  did not desaturate. Rest pulse ox was 96%, final pulse ox was 95%. HR response 99/min at rest to 105/min at peak exertion. Patient ARDELLE HALIBURTON  Did not Desaturate < 88% . Cleophas Dunker Amirault did not  Desaturated </= 3% points. Keshonna Valvo Carneiro yes did get tachyardic   IMPRESSION: ct chest 2018 1. Minimal scattered cylindrical bronchiectasis with associated minimal tree-in-bud opacity in the medial upper lobes and medial right middle lobe. Findings could  be due to atypical mycobacterial infection (MAI) . 2. Mild-to-moderate centrilobular emphysema with mild diffuse bronchial wall thickening, compatible with the provided history of COPD. 3. Two small solid pulmonary nodules in the right lung, largest with average diameter 5 mm in the right middle lobe. No follow-up needed if patient is low-risk (and has no known or suspected primary neoplasm). Non-contrast chest CT can be considered in 12 months if patient is high-risk. This recommendation follows the consensus statement: Guidelines for Management of Incidental Pulmonary Nodules Detected on CT Images: From the Fleischner Society 2017; Radiology 2017; 284:228-243. 4. One vessel coronary atherosclerosis.   Aortic Atherosclerosis (ICD10-I70.0) and Emphysema (ICD10-J43.9).     Electronically Signed   By: Ilona Sorrel M.D.   On: 02/11/2017 16:30  OV 02/01/2018  Subjective:  Patient ID: Hansel Starling, female , DOB: Dec 23, 1940 , age 80 y.o. , MRN: [id number] , ADDRESS: [place] [id number]   02/01/2018 -   Chief Complaint  Patient presents with   Follow-up    Pt states that her SOB has improved some, CT review   Follow-up Gold stage III COPD [MM phenotype] postbronchodilator FEV1 0.96 L / 46% with 26% bronchodilator response and DLCO 12.8/52% in February 2019.  CT scan of the chest with mild scattered bronchiectasis.  HPI GAEL DELUDE 80 y.o. -returns for follow-up of Gold stage III COPD.  She is taking triple inhaler therapy in the form of Incruse and Breo.  Overall her symptoms are stable and well-controlled.  COPD CAT score is 7.  Not much cough some amount of shortness of breath no chest pain at all.  Doing really well overall.  Up-to-date with her vaccines.  She does tell me that primary care physician thought her carbon dioxide level on chemistry was high.  Review of her chart shows, dioxide level December 2017 was normal.  She is asking for Incruse samples because  she is in the donut hole.  However he did not have them.  We talked about starting Trelegy in January 2020 and she is open and willing to look into this option.  Lung nodules: She had CT chest December 2019.  Follow-up from 1 year ago.  Nodules are diminished.  New finding of coronary artery calcification on CT scan December 2019: One-vessel coronary artery calcification seen.  No chest pains.  No prior stress test history.  She says she will talk about it with her primary care physician rather than take a cardiology referral right now.     CAT COPD Symptom & Quality of Life Score (GSK trademark) 0 is no burden. 5 is highest burden 02/11/2017  10/16/2017  02/01/2018   Never Cough -> Cough all the time 1 0   No phlegm in chest -> Chest is full  of phlegm 1 0   No chest tightness -> Chest feels very tight 3 0   No dyspnea for 1 flight stairs/hill -> Very dyspneic for 1 flight of stairs 4 1   No limitations for ADL at home -> Very limited with ADL at home 1 0   Confident leaving home -> Not at all confident leaving home 0 0   Sleep soundly -> Do not sleep soundly because of lung condition 3 0   Lots of Energy -> No energy at all 3 3   TOTAL Score (max 40)  16 4 7     IMPRESSION CT CHEST 1. Right middle lobe small solid pulmonary nodules are stable to decreased since 02/11/2017 chest CT, considered benign. No new significant pulmonary nodules. 2. Stable minimal scattered cylindrical bronchiectasis in medial upper lobes and medial right middle lobe. No evidence of active pulmonary infection. 3. One vessel coronary atherosclerosis. 4. Small hiatal hernia.   Aortic Atherosclerosis (ICD10-I70.0) and Emphysema (ICD10-J43.9).     Electronically Signed   By: Ilona Sorrel M.D.   On: 01/28/2018 12:22  ROS - per HPI   OV 04/24/2020  Subjective:  Patient ID: Hansel Starling, female , DOB: 1940-03-27 , age 27 y.o. , MRN: [id number] , ADDRESS: [place] [address] PCP Carol Ada, MD Patient Care Team: Carol Ada, MD as PCP - General (Family Medicine) Jerline Pain, MD as PCP - Cardiology (Cardiology)  This Provider for this visit: Treatment Team:  Attending Provider: Lauraine Rinne, NP    04/24/2020 -   Chief Complaint  Patient presents with   Follow-up    Doing well   Stage III COPD MM phenotype  HPI CYRA SPADER 80 y.o. -returns for follow-up.  I personally last saw her in December 2019.  After that she is followed up with nurse practitioners with the pandemic.  A year ago January 2020 when she had COVID that was mild.  She continues on Trelegy.  She is rarely used albuterol.  She uses nighttime oxygen.  Her COPD CAT score is 4.  She is doing really well.  No chest pains no shortness of breath.  Last CT scan of the chest was in 2019.  Last PFT was also in 2019.  She does not smoke.  She is up-to-date with her vaccines.   CAT Score 04/24/2020 02/01/2018  Total CAT Score 4 7     OV 01/08/2021  Subjective:  Patient ID: Hansel Starling, female , DOB: 1940-05-23 , age 40 y.o. , MRN: [id number] , ADDRESS: [place] [id number] PCP Carol Ada, MD Patient Care Team: Carol Ada, MD as PCP - General (Family Medicine) Jerline Pain, MD as PCP - Cardiology (Cardiology)  This Provider for this visit: Treatment Team:  Attending Provider: Brand Males, MD    01/08/2021 -   Chief Complaint  Patient presents with   Follow-up    No new concerns, pft done today   Stage III COPD MM phenotype  HPI Hydie Langan Rippeon 80 y.o. -presents for routine follow-up.  After seeing me last visit she did see nurse practitioner.  She had pulmonary function test today and the pulmonary function test is stable in the last 3 years.  She is up-to-date with her vaccination.  She is looking forward to having a great granddaughter in April 2023.  Her granddaughter is now 39 and has been married for 12 years and is finally been able to conceive.  The patient is cautiously optimistic and excited.  She did say that in July 2022 she fell down through her deck and bruised her right thigh and therefore she walks with a limp.  Other than that she is doing fine.  No respiratory exacerbations.  I did caution her about being vigilant about respiratory exacerbations particularly in the fall in the winter especially with human clustering.   CAT Score 04/24/2020 02/01/2018  Total CAT Score 4 7   IMPRESSION: 1. Right middle lobe small solid pulmonary nodules are stable to decreased since 02/11/2017 chest CT, considered benign. No new significant pulmonary nodules. 2. Stable minimal scattered cylindrical bronchiectasis in medial upper lobes and medial right middle lobe. No evidence of active pulmonary infection. 3. One vessel coronary atherosclerosis. 4. Small hiatal hernia.   Aortic Atherosclerosis (ICD10-I70.0) and Emphysema (ICD10-J43.9).     Electronically Signed   By: Ilona Sorrel M.D.   On: 01/28/2018 12:22     PFT  PFT Results Latest Ref Rng & Units 01/08/2021 04/01/2017  FVC-Pre L 1.93 1.69  FVC-Predicted Pre % 73 61  FVC-Post L - 2.02  FVC-Predicted Post % - 73  Pre FEV1/FVC % % 49 45  Post FEV1/FCV % % - 48  FEV1-Pre L 0.95 0.76  FEV1-Predicted Pre % 48 36  FEV1-Post L - 0.96  DLCO uncorrected ml/min/mmHg 10.82 12.81  DLCO UNC% % 57 52  DLCO corrected ml/min/mmHg 10.82 -  DLCO COR %Predicted % 57 -  DLVA Predicted % 88 84       has a past medical history of Anxiety, Arthritis, Asthma in child, Atherosclerotic heart disease of native coronary artery without angina pectoris, Cataracts, bilateral, Combined hyperlipidemia, Constipation, unspecified, COPD (chronic obstructive pulmonary disease) ([place]), Coronary artery spasm ([place]), Diabetic neuropathy ([place]), DM (diabetes mellitus) with complications ([place]), Dupuytren's contracture, Dyspepsia, Familial tremor, Fatigue, Fibromyalgia syndrome, GERD (gastroesophageal reflux disease),  Hashimoto's thyroiditis, Hiatal hernia, History of colon polyps, Hashimoto thyroiditis, Hypercholesteremia, Hypertension, Hypothyroidism, acquired, autoimmune, IBS (irritable bowel syndrome), Meniere's disease of left ear, Migraine without aura and without status migrainosus, not intractable, Multinodular goiter (nontoxic), Nonexudative macular degeneration, Raynaud's syndrome without gangrene, Situational stress, Tachycardia, Thyroiditis, autoimmune, Ulcerative colitis, unspecified, without complications ([place]), Vitamin B 12 deficiency, Vitamin D deficiency, and Wears glasses.   reports that she quit smoking about 19 years ago. Her smoking use included cigarettes. She has a 21.00 pack-year smoking history. She has never used smokeless tobacco.  Past Surgical History:  Procedure Laterality Date   BREAST EXCISIONAL BIOPSY Left    benign   BREAST EXCISIONAL BIOPSY Right    milk gland removed   BREAST LUMPECTOMY  1990   left   BREAST LUMPECTOMY WITH NEEDLE LOCALIZATION Right 02/28/2013   Procedure: RIGHT BREAST NEEDLE LOCALIZATION LUMPECTOMY ;  Surgeon: Edward Jolly, MD;  Location: [facility];  Service: General;  Laterality: Right;   CHOLECYSTECTOMY  2008   lapcholi   DILATION AND CURETTAGE OF UTERUS     LEFT HEART CATHETERIZATION WITH CORONARY ANGIOGRAM N/A 01/26/2013   Procedure: LEFT HEART CATHETERIZATION WITH CORONARY ANGIOGRAM;  Surgeon: Candee Furbish, MD;  Location[facility] CATH LAB;  Service: Cardiovascular;  Laterality: N/A;   PARTIAL HYSTERECTOMY  1971   TONSILLECTOMY      Allergies  Allergen Reactions   Acyclovir And Related    Allegra [Fexofenadine] Hives   Amoxicillin Hives   Codeine Nausea And Vomiting   Elemental Sulfur Hives   Enalapril Maleate Cough   Hydrocodone Bit-Homatrop Mbr Other (See  Comments)   Keflex [Cephalexin] Hives   Levofloxacin Other (See Comments)    insomnia   Lorabid [Loracarbef] Hives   Other Other (See Comments)   Sulfa Antibiotics  Hives    Immunization History  Administered Date(s) Administered   Influenza Split 11/30/2007, 01/17/2015, 01/08/2016, 11/27/2017, 12/14/2018   Influenza, High Dose Seasonal PF 12/01/2012, 12/05/2013, 12/12/2016, 11/27/2017, 12/14/2018, 01/01/2021   Influenza-Unspecified 12/18/2011   PFIZER(Purple Top)SARS-COV-2 Vaccination 05/05/2019, 05/26/2019, 12/12/2019   Pneumococcal Conjugate-13 07/18/2015, 10/16/2017   Pneumococcal Polysaccharide-23 10/23/2008, 10/20/2018   Td 07/13/2007   Tdap 07/27/2017   Zoster, Live 05/04/2014    Family History  Problem Relation Age of Onset   Cancer Mother    Hypertension Mother    Stroke Mother    Heart attack Father    Sudden death Father    Hypertension Sister    Diabetes Sister    Stroke Sister    Cancer Maternal Uncle        breast,    Cancer Paternal Aunt        breast, ovarian   Thyroid disease Neg Hx      Current Outpatient Medications:    albuterol (VENTOLIN HFA) 108 (90 Base) MCG/ACT inhaler, Inhale 1-2 puffs into the lungs every 6 (six) hours as needed for wheezing or shortness of breath., Disp: 18 g, Rfl: 6   aspirin EC 81 MG tablet, Take 1 tablet (81 mg total) by mouth daily., Disp: , Rfl:    benzonatate (TESSALON) 100 MG capsule, Take 100 mg by mouth 3 (three) times daily as needed., Disp: , Rfl:    Cyanocobalamin (VITAMIN B 12 PO), Take by mouth daily., Disp: , Rfl:    DULoxetine (CYMBALTA) 30 MG capsule, Take 30 mg by mouth 2 (two) times daily., Disp: , Rfl:    Fluticasone-Umeclidin-Vilant (TRELEGY ELLIPTA) 100-62.5-25 MCG/INH AEPB, Inhale 1 puff into the lungs daily., Disp: 180 each, Rfl: 3   furosemide (LASIX) 20 MG tablet, Take 20 mg by mouth as needed., Disp: , Rfl:    hydrochlorothiazide (HYDRODIURIL) 25 MG tablet, TAKE 1 TABLET BY MOUTH EVERY DAY IN THE MORNING, Disp: , Rfl: 1   levothyroxine (SYNTHROID) 88 MCG tablet, Take 1 tablet (88 mcg total) by mouth daily before breakfast. 1 tablet  6 days each week, but take only  1/2 pill on the 7th day, Disp: 30 tablet, Rfl: 5   LORazepam (ATIVAN) 0.5 MG tablet, TAKE 1/2 TAB EVERY 8 12 HOURS AS NEEDED FOR ANXIOUSNESS, Disp: , Rfl:    losartan (COZAAR) 100 MG tablet, Take 100 mg by mouth daily., Disp: , Rfl: 1   meclizine (ANTIVERT) 25 MG tablet, 1/2 to 1 tablet by mouth every 8 hrs as needed for diziness, Disp: , Rfl:    Multiple Vitamin (MULTIVITAMIN) tablet, Take 1 tablet by mouth daily., Disp: , Rfl:    Multiple Vitamins-Minerals (PRESERVISION AREDS) TABS, See admin instructions., Disp: , Rfl:    omeprazole (PRILOSEC) 20 MG capsule, Take 20 mg by mouth daily., Disp: , Rfl:    PROLENSA 0.07 % SOLN, Place 1 drop into the right eye at bedtime., Disp: , Rfl:    RABEprazole (ACIPHEX) 20 MG tablet, Take one tablet twice daily., Disp: 180 tablet, Rfl: 3   Respiratory Therapy Supplies (FLUTTER) DEVI, Use device 2-3 times a day to break up congestion, Disp: 1 each, Rfl: 0   rosuvastatin (CRESTOR) 20 MG tablet, Take 1 tablet (20 mg total) by mouth daily., Disp: 90 tablet, Rfl: 3   VITAMIN D, CHOLECALCIFEROL,  PO, Take 50,000 Units by mouth once a week. , Disp: , Rfl:    BESIVANCE 0.6 % SUSP, Place 1 drop into the right eye 3 (three) times daily. (Patient not taking: Reported on 01/08/2021), Disp: , Rfl:    doxycycline (VIBRA-TABS) 100 MG tablet, Take 1 tablet (100 mg total) by mouth 2 (two) times daily. (Patient not taking: Reported on 01/08/2021), Disp: 14 tablet, Rfl: 0   prednisoLONE acetate (PRED FORTE) 1 % ophthalmic suspension, Place 1 drop into the right eye 4 (four) times daily. (Patient not taking: Reported on 01/08/2021), Disp: , Rfl:       Objective:   Vitals:   01/08/21 1211  BP: 126/74  Pulse: 73  SpO2: 97%  Weight: 159 lb 9.6 oz (72.4 kg)  Height: 5\' 4"  (1.626 m)    Estimated body mass index is 27.4 kg/m as calculated from the following:   Height as of this encounter: 5\' 4"  (1.626 m).   Weight as of this encounter: 159 lb 9.6 oz (72.4  kg).  @WEIGHTCHANGE @  [employer]   01/08/21 1211  Weight: 159 lb 9.6 oz (72.4 kg)     Physical Exam General: No distress. Looks well Neuro: Alert and Oriented x 3. GCS 15. Speech normal Psych: Pleasant Resp:  Barrel Chest - no.  Wheeze - no, Crackles - no, No overt respiratory distress CVS: Normal heart sounds. Murmurs - no Ext: Stigmata of Connective Tissue Disease - no.  Walks with a slight limp HEENT: Normal upper airway. PEERL +. No post nasal drip        Assessment:       ICD-10-CM   1. Stage 3 severe COPD by GOLD classification ([place])  J44.9     2. Nocturnal hypoxemia due to emphysema (HCC)  J43.9    G47.36          Plan:     Patient Instructions     ICD-10-CM   1. Stage 3 severe COPD by GOLD classification ([place])  J44.9   2. Nocturnal hypoxemia due to emphysema (HCC)  J43.9    G47.36    Stable Gold stage III COPD  - PFT stable x 3 years Glad you are up-to-date with all your vaccines  Plan -Continue Trelegy daily [CMA to refill] -Continue albuterol as needed [CMA to do refill) = Continue oxygen as needed - Best wishes for being great grandma in April 2023   Follow-up -Return in 9 months to see [name]   - CAT score and simple walk test at followup    SIGNATURE    Dr. Brand Males, M.D., F.C.C.P,  Pulmonary and Critical Care Medicine Staff Physician, [place] Director - Interstitial Lung Disease  Program  Pulmonary [place] at [place], [place], [id number]  Pager: [phone], If no answer or between  15:00h - 7:00h: call [phone] Telephone: [phone]  12:34 PM 01/08/2021

## 2021-01-10 DIAGNOSIS — M7631 Iliotibial band syndrome, right leg: Secondary | ICD-10-CM | POA: Diagnosis not present

## 2021-01-10 DIAGNOSIS — M7061 Trochanteric bursitis, right hip: Secondary | ICD-10-CM | POA: Diagnosis not present

## 2021-02-02 DIAGNOSIS — J449 Chronic obstructive pulmonary disease, unspecified: Secondary | ICD-10-CM | POA: Diagnosis not present

## 2021-02-03 ENCOUNTER — Other Ambulatory Visit (INDEPENDENT_AMBULATORY_CARE_PROVIDER_SITE_OTHER): Payer: Self-pay | Admitting: "Endocrinology

## 2021-02-03 DIAGNOSIS — E034 Atrophy of thyroid (acquired): Secondary | ICD-10-CM

## 2021-02-19 ENCOUNTER — Telehealth: Payer: Self-pay | Admitting: Internal Medicine

## 2021-02-19 MED ORDER — DOXYCYCLINE HYCLATE 100 MG PO TABS: 100.0000 mg | ORAL_TABLET | Freq: Two times a day (BID) | ORAL | 0 refills | Status: DC

## 2021-02-19 MED ORDER — PREDNISONE 10 MG PO TABS: ORAL_TABLET | ORAL | 0 refills | Status: DC

## 2021-02-19 NOTE — Telephone Encounter (Signed)
I called the patient and she feels that she may be having a COPD exacerbation and she is having some runny nose, and a dry cough. She reports that it has been going on since 02/16/21 and has took some sudafed. She denies any other symptoms.   Patient reports that she has not been around anyone who is sick. She has not had a recent covid test. Please advise.

## 2021-02-19 NOTE — Telephone Encounter (Signed)
Needs to do 2-3 covid test between today and tomorrow Has she done a flu test? IF not should consider Any of this positive, should cal in for antiviral She can come to office as well to get flu test done  Meanwhile, Rx for aecopd Take doxycycline 100mg  po twice daily x 5 days; take after meals and avoid sunlight Please take prednisone 40 mg x1 day, then 30 mg x1 day, then 20 mg x1 day, then 10 mg x1 day, and then 5 mg x1 day and stop   Allergies  Allergen Reactions   Acyclovir And Related    Allegra [Fexofenadine] Hives   Amoxicillin Hives   Codeine Nausea And Vomiting   Elemental Sulfur Hives   Enalapril Maleate Cough   Hydrocodone Bit-Homatrop Mbr Other (See Comments)   Keflex [Cephalexin] Hives   Levofloxacin Other (See Comments)    insomnia   Lorabid [Loracarbef] Hives   Other Other (See Comments)   Sulfa Antibiotics Hives

## 2021-02-19 NOTE — Telephone Encounter (Signed)
Called and spoke with patient. She verbalized understanding. She stated that she does not have a covid test at home but will go to [facility] to buy one. She will call us back with the results.   She also verbalized understanding about the doxy and prednisone. Will go ahead and send these to [facility].   Wait await her test results.

## 2021-02-20 NOTE — Telephone Encounter (Signed)
Called pt back and there was no answer- LMTCB 

## 2021-02-20 NOTE — Telephone Encounter (Signed)
Spoke with the pt  She states she had 2 neg covid tests  Has started on doxy and pred, feeling a little better today  Offered appt today and she declined, wants to keep the one she has planned for 02/26/21  Will call sooner if needed

## 2021-02-26 ENCOUNTER — Ambulatory Visit (INDEPENDENT_AMBULATORY_CARE_PROVIDER_SITE_OTHER): Payer: Medicare Other

## 2021-02-26 ENCOUNTER — Other Ambulatory Visit: Payer: Self-pay

## 2021-02-26 ENCOUNTER — Encounter: Payer: Self-pay | Admitting: Nurse Practitioner

## 2021-02-26 ENCOUNTER — Ambulatory Visit: Payer: Medicare Other | Admitting: Nurse Practitioner

## 2021-02-26 VITALS — BP 108/70 | HR 92 | Temp 97.7°F | Ht 64.5 in | Wt 160.0 lb

## 2021-02-26 DIAGNOSIS — J441 Chronic obstructive pulmonary disease with (acute) exacerbation: Secondary | ICD-10-CM | POA: Diagnosis not present

## 2021-02-26 DIAGNOSIS — J471 Bronchiectasis with (acute) exacerbation: Secondary | ICD-10-CM

## 2021-02-26 DIAGNOSIS — J069 Acute upper respiratory infection, unspecified: Secondary | ICD-10-CM

## 2021-02-26 DIAGNOSIS — R059 Cough, unspecified: Secondary | ICD-10-CM | POA: Diagnosis not present

## 2021-02-26 DIAGNOSIS — R0602 Shortness of breath: Secondary | ICD-10-CM | POA: Diagnosis not present

## 2021-02-26 MED ORDER — FLUTICASONE PROPIONATE 50 MCG/ACT NA SUSP: 1.0000 | Freq: Every day | NASAL | 2 refills | Status: DC

## 2021-02-26 MED ORDER — DOXYCYCLINE HYCLATE 100 MG PO TABS: 100.0000 mg | ORAL_TABLET | Freq: Two times a day (BID) | ORAL | 0 refills | Status: DC

## 2021-02-26 MED ORDER — PREDNISONE 10 MG PO TABS: ORAL_TABLET | ORAL | 0 refills | Status: DC

## 2021-02-26 NOTE — Progress Notes (Signed)
@Patient  ID: Shelby Lin, female    DOB: Feb 21, 1941, 81 y.o.   MRN: [id number]  Chief Complaint  Patient presents with   Follow-up    Head cold    Referring provider: Carol Ada, MD  HPI: 81 year old female, former smoker (21 pack years) followed for stage III severe COPD by Gold classification and bronchiectasis.  She is a patient of Dr. Golden Pop and was last seen in office on 01/09/2019.  Past medical history significant for coronary artery spasms, hypertension, GERD, hypothyroidism, Mnire's disease, fibromyalgia, prediabetes.  TEST/EVENTS:  01/28/2018 CT chest without contrast: Atherosclerosis.  Mild to moderate centrilobular emphysema with diffuse bronchial wall thickening.  Right middle lobe solid 8 x 2 mm pulmonary nodule stable since 02/11/2017 CT chest, considered benign.  Separate 2 mm solid right middle lobe pulmonary nodule is decreased from 4 mm considered benign.  Minimal scattered cylindrical bronchiectasis in right middle lobe and medial bilateral upper lobe is unchanged.  Small hiatal hernia. 09/10/2020 CXR 2 view: Lungs clear.  Heart size normal.  Atherosclerosis.  No acute disease. 01/09/2019 PFTs: FVC 1.93 (73), FEV1 0.95 (48), ratio 49, DLCO uncorrected 10.82 (57)  01/08/2021: OV with [name].  No acute new concerns follow-up after PFTs which are stable in the last 3 years.  Continued Trelegy daily and albuterol as needed.  Continued oxygen as needed.  Follow-up in 9 months.  02/26/2021: Today-acute visit Patient presents today for worsening cough and nasal congestion.  Her symptoms started on 12/24 and have persisted since.  She notes that her cough is productive with clear sputum production. She has had nasal congestion with clear rhinorrhea and sinus pressure/pain. She has noticed a mild increase in her shortness of breath with exertion as well. She denies recent sick exposures, fevers or chills. She denies wheezing, orthopnea, PND, lower extremity  swelling, or chest pain. She was previously started on doxycyline for 7 days and prednisone for 5. She did notice a slight improvement of her symptoms while on the prednisone and still has 1 dose of doxy left. She has been taking Robitussin DM with honey otc for her cough with relief. She continues on her Trelegy daily. She has not felt the need to use her albuterol inhaler. She is unable to take mucinex as it "makes her feel funny".   Allergies  Allergen Reactions   Acyclovir And Related    Allegra [Fexofenadine] Hives   Amoxicillin Hives   Codeine Nausea And Vomiting   Elemental Sulfur Hives   Enalapril Maleate Cough   Hydrocodone Bit-Homatrop Mbr Other (See Comments)   Keflex [Cephalexin] Hives   Levofloxacin Other (See Comments)    insomnia   Lorabid [Loracarbef] Hives   Other Other (See Comments)   Sulfa Antibiotics Hives    Immunization History  Administered Date(s) Administered   Influenza Split 11/30/2007, 01/17/2015, 01/08/2016, 11/27/2017, 12/14/2018   Influenza, High Dose Seasonal PF 12/01/2012, 12/05/2013, 12/12/2016, 11/27/2017, 12/14/2018, 01/01/2021   Influenza-Unspecified 12/18/2011   PFIZER(Purple Top)SARS-COV-2 Vaccination 05/05/2019, 05/26/2019, 12/12/2019   Pneumococcal Conjugate-13 07/18/2015, 10/16/2017   Pneumococcal Polysaccharide-23 10/23/2008, 10/20/2018   Td 07/13/2007   Tdap 07/27/2017   Zoster, Live 05/04/2014    Past Medical History:  Diagnosis Date   Anxiety    Arthritis    Asthma in child    Atherosclerotic heart disease of native coronary artery without angina pectoris    Cataracts, bilateral    Combined hyperlipidemia    Constipation, unspecified    COPD (chronic obstructive pulmonary disease) ([place])  Coronary artery spasm (HCC)    Diabetic neuropathy (HCC)    DM (diabetes mellitus) with complications (HCC)    Dupuytren's contracture    Dyspepsia    Familial tremor    Fatigue    Fibromyalgia syndrome    GERD (gastroesophageal reflux  disease)    Hashimoto's thyroiditis    Hiatal hernia    History of colon polyps    Hx of Hashimoto thyroiditis    Hypercholesteremia    Hypertension    Hypothyroidism, acquired, autoimmune    IBS (irritable bowel syndrome)    Meniere's disease of left ear    Migraine without aura and without status migrainosus, not intractable    Multinodular goiter (nontoxic)    Nonexudative macular degeneration    Raynaud's syndrome without gangrene    Situational stress    Tachycardia    Thyroiditis, autoimmune    Ulcerative colitis, unspecified, without complications (HCC)    Vitamin B 12 deficiency    Vitamin D deficiency    Wears glasses     Tobacco History: Social History   Tobacco Use  Smoking Status Former   Packs/day: 0.50   Years: 42.00   Pack years: 21.00   Types: Cigarettes   Quit date: 02/21/2001   Years since quitting: 20.0  Smokeless Tobacco Never   Counseling given: Not Answered   Outpatient Medications Prior to Visit  Medication Sig Dispense Refill   albuterol (VENTOLIN HFA) 108 (90 Base) MCG/ACT inhaler Inhale 1-2 puffs into the lungs every 6 (six) hours as needed for wheezing or shortness of breath. 18 g 6   aspirin EC 81 MG tablet Take 1 tablet (81 mg total) by mouth daily.     benzonatate (TESSALON) 100 MG capsule Take 100 mg by mouth 3 (three) times daily as needed.     Cyanocobalamin (VITAMIN B 12 PO) Take by mouth daily.     DULoxetine (CYMBALTA) 30 MG capsule Take 30 mg by mouth 2 (two) times daily.     Fluticasone-Umeclidin-Vilant (TRELEGY ELLIPTA) 100-62.5-25 MCG/INH AEPB Inhale 1 puff into the lungs daily. 180 each 3   furosemide (LASIX) 20 MG tablet Take 20 mg by mouth as needed.     hydrochlorothiazide (HYDRODIURIL) 25 MG tablet TAKE 1 TABLET BY MOUTH EVERY DAY IN THE MORNING  1   LORazepam (ATIVAN) 0.5 MG tablet TAKE 1/2 TAB EVERY 8 12 HOURS AS NEEDED FOR ANXIOUSNESS     losartan (COZAAR) 100 MG tablet Take 100 mg by mouth daily.  1   meclizine  (ANTIVERT) 25 MG tablet 1/2 to 1 tablet by mouth every 8 hrs as needed for diziness     Multiple Vitamin (MULTIVITAMIN) tablet Take 1 tablet by mouth daily.     Multiple Vitamins-Minerals (PRESERVISION AREDS) TABS See admin instructions.     omeprazole (PRILOSEC) 20 MG capsule Take 20 mg by mouth daily.     PROLENSA 0.07 % SOLN Place 1 drop into the right eye at bedtime.     RABEprazole (ACIPHEX) 20 MG tablet Take one tablet twice daily. 180 tablet 3   Respiratory Therapy Supplies (FLUTTER) DEVI Use device 2-3 times a day to break up congestion 1 each 0   rosuvastatin (CRESTOR) 20 MG tablet Take 1 tablet (20 mg total) by mouth daily. 90 tablet 3   SYNTHROID 88 MCG tablet TAKE DAILY BEFORE BREAKFAST. 1 TABLET 6 DAYS EACH WEEK, BUT TAKE ONLY 1/2 TABLET ON THE 7TH DAY 30 tablet 5   VITAMIN D, CHOLECALCIFEROL, PO Take 50,000  Units by mouth once a week.      BESIVANCE 0.6 % SUSP Place 1 drop into the right eye 3 (three) times daily. (Patient not taking: Reported on 01/08/2021)     doxycycline (VIBRA-TABS) 100 MG tablet Take 1 tablet (100 mg total) by mouth 2 (two) times daily. (Patient not taking: Reported on 02/26/2021) 14 tablet 0   prednisoLONE acetate (PRED FORTE) 1 % ophthalmic suspension Place 1 drop into the right eye 4 (four) times daily. (Patient not taking: Reported on 01/08/2021)     predniSONE (DELTASONE) 10 MG tablet Take 4 tabs x1 day, 3 tabs x1 day, 2 tabs x1 day, 1 tab x1 day and then 1/2 tab x1 day. (Patient not taking: Reported on 02/26/2021) 11 tablet 0   No facility-administered medications prior to visit.     Review of Systems:   Constitutional: No weight loss or gain, night sweats, fevers, chills, fatigue, or lassitude. HEENT: No headaches, difficulty swallowing, tooth/dental problems, or sore throat. No sneezing, itching, ear ache. +sinus pressure, nasal congestion, clear rhinorrhea CV:  No chest pain, orthopnea, PND, swelling in lower extremities, anasarca, dizziness,  palpitations, syncope Resp: +shortness of breath with exertion (mild increase from baseline); productive cough with clear sputum. No hemoptysis. No wheezing.  No chest wall deformity GI:  No heartburn, indigestion, abdominal pain, nausea, vomiting, diarrhea, change in bowel habits, loss of appetite, bloody stools.  GU: No dysuria, change in color of urine, urgency or frequency.  No flank pain, no hematuria  Skin: No rash, lesions, ulcerations MSK:  No joint pain or swelling.  No decreased range of motion.  No back pain. Neuro: No dizziness or lightheadedness.  Psych: No depression or anxiety. Mood stable.     Physical Exam:  BP 108/70 (BP Location: Right Arm, Cuff Size: Normal)    Pulse 92    Temp 97.7 F (36.5 C) (Oral)    Ht 5' 4.5" (1.638 m)    Wt 160 lb (72.6 kg)    SpO2 97%    BMI 27.04 kg/m   GEN: Pleasant, interactive, well-nourished, well-appearing; in no acute distress. HEENT:  Normocephalic and atraumatic. EACs patent bilaterally. TM pearly gray with present light reflex bilaterally. PERRLA. Sclera white. Nasal turbinates pink, moist and patent bilaterally. Clear rhinorrhea present. Oropharynx erythematous and moist, without exudate or edema. No lesions, ulcerations NECK:  Supple w/ fair ROM. No JVD present. Normal carotid impulses w/o bruits. Thyroid symmetrical with no goiter or nodules palpated. No lymphadenopathy.   CV: RRR, no m/r/g, no peripheral edema. Pulses intact, +2 bilaterally. No cyanosis, pallor or clubbing. PULMONARY:  Unlabored, regular breathing. Scattered rhonchi bilaterally A&P w/o wheezes. No accessory muscle use. No dullness to percussion. GI: BS present and normoactive. Soft, non-tender to palpation. No organomegaly or masses detected. No CVA tenderness. MSK: No erythema, warmth or tenderness. Cap refil <2 sec all extrem. No deformities or joint swelling noted.  Neuro: A/Ox3. No focal deficits noted.   Skin: Warm, no lesions or rashe Psych: Normal affect and  behavior. Judgement and thought content appropriate.     Lab Results:  CBC    Component Value Date/Time   WBC 14.0 (H) 02/28/2018 1732   RBC 3.93 02/28/2018 1732   HGB 11.6 (L) 02/28/2018 1732   HCT 37.0 02/28/2018 1732   PLT 382 02/28/2018 1732   MCV 94.1 02/28/2018 1732   MCH 29.5 02/28/2018 1732   MCHC 31.4 02/28/2018 1732   RDW 12.3 02/28/2018 1732   LYMPHSABS 2.9 02/28/2018 1732  MONOABS 1.2 (H) 02/28/2018 1732   EOSABS 0.3 02/28/2018 1732   BASOSABS 0.1 02/28/2018 1732    BMET    Component Value Date/Time   NA 136 02/28/2018 1732   K 3.5 02/28/2018 1732   CL 98 02/28/2018 1732   CO2 29 02/28/2018 1732   GLUCOSE 133 (H) 02/28/2018 1732   BUN 12 02/28/2018 1732   CREATININE 0.63 02/28/2018 1732   CREATININE 0.59 (L) 02/22/2016 0001   CALCIUM 9.7 12/17/2020 0916   CALCIUM 9.7 04/06/2012 1019   GFRNONAA >60 02/28/2018 1732   GFRAA >60 02/28/2018 1732    BNP    Component Value Date/Time   BNP 25.5 02/28/2018 1732     Imaging:  02/26/2021: CXR today reviewed by me. Borderline to mild hyperinflation. No acute process noted.   DG Chest 2 View  Result Date: 02/26/2021 CLINICAL DATA:  81 year old female with increased shortness of breath and cough. EXAM: CHEST - 2 VIEW COMPARISON:  Chest radiograph 09/10/2020 and earlier. FINDINGS: Lung volumes are stable, with borderline to mild hyperinflation. Normal cardiac size and mediastinal contours. Visualized tracheal air column is within normal limits. Lung markings appear stable since 2020. No pneumothorax, pleural effusion or acute pulmonary opacity. Osteopenia. No acute osseous abnormality identified. Stable cholecystectomy clips. Negative visible bowel gas. IMPRESSION: No acute cardiopulmonary abnormality. Electronically Signed   By: Genevie Ann M.D.   On: 02/26/2021 11:54      PFT Results Latest Ref Rng & Units 01/08/2021 04/01/2017  FVC-Pre L 1.93 1.69  FVC-Predicted Pre % 73 61  FVC-Post L - 2.02  FVC-Predicted Post  % - 73  Pre FEV1/FVC % % 49 45  Post FEV1/FCV % % - 48  FEV1-Pre L 0.95 0.76  FEV1-Predicted Pre % 48 36  FEV1-Post L - 0.96  DLCO uncorrected ml/min/mmHg 10.82 12.81  DLCO UNC% % 57 52  DLCO corrected ml/min/mmHg 10.82 -  DLCO COR %Predicted % 57 -  DLVA Predicted % 88 84    No results found for: NITRICOXIDE      Assessment & Plan:   COPD with acute exacerbation (HCC) Likely related to URI. Given length of symptoms, no indication to swab for COVID or flu today. Will tx with a longer prednisone taper and 3 more days of doxycyline for 10 days total. Continue supportive care measures. Continue Trelegy daily and PRN albuterol. CXR today negative for acute process.   Patient Instructions  -Continue Trelegy inhaler 1 puff daily. Brush tongue and rinse mouth afterwards -Continue albuterol inhaler 1-2 puffs every 6 hours as needed for shortness of breath or wheezing -Continue robitussin cough syrup over the counter as directed as needed for cough  -Continue tessalon perles (benzonatate) three times a day as needed for cough   Addition 3 days of doxycyline 100 mg Twice daily. Notify immediately of any rash, itching, hives, or swelling, or seek emergency care.Finish your antibiotics in their entirety. Do not stop just because symptoms improve. Take with food to reduce GI upset. Avoid direct sun exposure. This medication causes photosensitivity and hyperpigmention  -Prednisone taper pack. 4 tabs for 2 days, then 3 tabs for 2 days, 2 tabs for 2 days, then 1 tab for 2 days, then stop. Take in AM with food.  -Flonase nasal spray 1-2 sprays each nostril daily  -Saline nasal spray 2-3 times a day as needed for nasal congestion/postnasal drip until symptoms improve  Notify if worsening breathlessness, cough, mucus production, fatigue, or wheezing occurs.  Maintain up to date  vaccinations, including influenza, COVID, and pneumococcal.  Wash your hands often and avoid sick exposures.  Encouraged  masking in crowds.  Avoid triggers, when possible.  Exercise, as tolerated. Notify if worsening symptoms upon exertion occur.    Chest x ray today. We will notify you of results.  Follow up in 2 weeks with [name] or Roxan Diesel, NP. If symptoms do not improve or worsen, please contact office for sooner follow up or seek emergency care.   Bronchiectasis with acute exacerbation ([place]) Unable to tolerate mucinex, per pt. Flutter valve Twice daily. Activity, as tolerated. Suspect flare related to URI. Could add hypertonic nebs if cough/mucous production persist. See above.   URI (upper respiratory infection) Flonase 1-2 sprays each nostril daily until symptoms improve then PRN. Prednisone taper and additional 3 days of doxycycline. Supportive care. See above.    Clayton Bibles, NP 02/26/2021  Pt aware and understands NP's role.

## 2021-02-26 NOTE — Assessment & Plan Note (Addendum)
Likely related to URI. Given length of symptoms, no indication to swab for COVID or flu today. Will tx with a longer prednisone taper and 3 more days of doxycyline for 10 days total. Continue supportive care measures. Continue Trelegy daily and PRN albuterol. CXR today negative for acute process.   Patient Instructions  -Continue Trelegy inhaler 1 puff daily. Brush tongue and rinse mouth afterwards -Continue albuterol inhaler 1-2 puffs every 6 hours as needed for shortness of breath or wheezing -Continue robitussin cough syrup over the counter as directed as needed for cough  -Continue tessalon perles (benzonatate) three times a day as needed for cough   Addition 3 days of doxycyline 100 mg Twice daily. Notify immediately of any rash, itching, hives, or swelling, or seek emergency care.Finish your antibiotics in their entirety. Do not stop just because symptoms improve. Take with food to reduce GI upset. Avoid direct sun exposure. This medication causes photosensitivity and hyperpigmention  -Prednisone taper pack. 4 tabs for 2 days, then 3 tabs for 2 days, 2 tabs for 2 days, then 1 tab for 2 days, then stop. Take in AM with food.  -Flonase nasal spray 1-2 sprays each nostril daily  -Saline nasal spray 2-3 times a day as needed for nasal congestion/postnasal drip until symptoms improve  Notify if worsening breathlessness, cough, mucus production, fatigue, or wheezing occurs.  Maintain up to date vaccinations, including influenza, COVID, and pneumococcal.  Wash your hands often and avoid sick exposures.  Encouraged masking in crowds.  Avoid triggers, when possible.  Exercise, as tolerated. Notify if worsening symptoms upon exertion occur.    Chest x ray today. We will notify you of results.  Follow up in 2 weeks with [name] or Roxan Diesel, NP. If symptoms do not improve or worsen, please contact office for sooner follow up or seek emergency care.

## 2021-02-26 NOTE — Assessment & Plan Note (Signed)
Unable to tolerate mucinex, per pt. Flutter valve Twice daily. Activity, as tolerated. Suspect flare related to URI. Could add hypertonic nebs if cough/mucous production persist. See above.

## 2021-02-26 NOTE — Assessment & Plan Note (Signed)
Flonase 1-2 sprays each nostril daily until symptoms improve then PRN. Prednisone taper and additional 3 days of doxycycline. Supportive care. See above.

## 2021-02-26 NOTE — Patient Instructions (Addendum)
-  Continue Trelegy inhaler 1 puff daily. Brush tongue and rinse mouth afterwards -Continue albuterol inhaler 1-2 puffs every 6 hours as needed for shortness of breath or wheezing -Continue robitussin cough syrup over the counter as directed as needed for cough  -Continue tessalon perles (benzonatate) three times a day as needed for cough   Addition 3 days of doxycyline 100 mg Twice daily. Notify immediately of any rash, itching, hives, or swelling, or seek emergency care.Finish your antibiotics in their entirety. Do not stop just because symptoms improve. Take with food to reduce GI upset. Avoid direct sun exposure. This medication causes photosensitivity and hyperpigmention  -Prednisone taper pack. 4 tabs for 2 days, then 3 tabs for 2 days, 2 tabs for 2 days, then 1 tab for 2 days, then stop. Take in AM with food.  -Flonase nasal spray 1-2 sprays each nostril daily  -Saline nasal spray 2-3 times a day as needed for nasal congestion/postnasal drip until symptoms improve  Notify if worsening breathlessness, cough, mucus production, fatigue, or wheezing occurs.  Maintain up to date vaccinations, including influenza, COVID, and pneumococcal.  Wash your hands often and avoid sick exposures.  Encouraged masking in crowds.  Avoid triggers, when possible.  Exercise, as tolerated. Notify if worsening symptoms upon exertion occur.    Chest x ray today. We will notify you of results.  Follow up in 2 weeks with [name] or Roxan Diesel, NP. If symptoms do not improve or worsen, please contact office for sooner follow up or seek emergency care.

## 2021-03-05 DIAGNOSIS — J449 Chronic obstructive pulmonary disease, unspecified: Secondary | ICD-10-CM | POA: Diagnosis not present

## 2021-03-13 DIAGNOSIS — K5900 Constipation, unspecified: Secondary | ICD-10-CM | POA: Diagnosis not present

## 2021-03-13 DIAGNOSIS — K219 Gastro-esophageal reflux disease without esophagitis: Secondary | ICD-10-CM | POA: Diagnosis not present

## 2021-03-15 ENCOUNTER — Ambulatory Visit: Payer: Medicare Other | Admitting: Nurse Practitioner

## 2021-03-19 DIAGNOSIS — K219 Gastro-esophageal reflux disease without esophagitis: Secondary | ICD-10-CM | POA: Diagnosis not present

## 2021-03-19 DIAGNOSIS — Z1389 Encounter for screening for other disorder: Secondary | ICD-10-CM | POA: Diagnosis not present

## 2021-03-19 DIAGNOSIS — J449 Chronic obstructive pulmonary disease, unspecified: Secondary | ICD-10-CM | POA: Diagnosis not present

## 2021-03-19 DIAGNOSIS — G44201 Tension-type headache, unspecified, intractable: Secondary | ICD-10-CM | POA: Diagnosis not present

## 2021-03-19 DIAGNOSIS — M797 Fibromyalgia: Secondary | ICD-10-CM | POA: Diagnosis not present

## 2021-03-19 DIAGNOSIS — E1169 Type 2 diabetes mellitus with other specified complication: Secondary | ICD-10-CM | POA: Diagnosis not present

## 2021-03-19 DIAGNOSIS — H532 Diplopia: Secondary | ICD-10-CM | POA: Diagnosis not present

## 2021-03-19 DIAGNOSIS — I10 Essential (primary) hypertension: Secondary | ICD-10-CM | POA: Diagnosis not present

## 2021-03-19 DIAGNOSIS — E7800 Pure hypercholesterolemia, unspecified: Secondary | ICD-10-CM | POA: Diagnosis not present

## 2021-03-19 DIAGNOSIS — Z0000 Encounter for general adult medical examination without abnormal findings: Secondary | ICD-10-CM | POA: Diagnosis not present

## 2021-03-19 DIAGNOSIS — E039 Hypothyroidism, unspecified: Secondary | ICD-10-CM | POA: Diagnosis not present

## 2021-03-27 DIAGNOSIS — M5134 Other intervertebral disc degeneration, thoracic region: Secondary | ICD-10-CM | POA: Diagnosis not present

## 2021-03-27 DIAGNOSIS — M9902 Segmental and somatic dysfunction of thoracic region: Secondary | ICD-10-CM | POA: Diagnosis not present

## 2021-04-02 DIAGNOSIS — M5134 Other intervertebral disc degeneration, thoracic region: Secondary | ICD-10-CM | POA: Diagnosis not present

## 2021-04-02 DIAGNOSIS — D3132 Benign neoplasm of left choroid: Secondary | ICD-10-CM | POA: Diagnosis not present

## 2021-04-02 DIAGNOSIS — M9902 Segmental and somatic dysfunction of thoracic region: Secondary | ICD-10-CM | POA: Diagnosis not present

## 2021-04-05 DIAGNOSIS — J449 Chronic obstructive pulmonary disease, unspecified: Secondary | ICD-10-CM | POA: Diagnosis not present

## 2021-05-02 ENCOUNTER — Other Ambulatory Visit: Payer: Self-pay | Admitting: Internal Medicine

## 2021-05-02 ENCOUNTER — Other Ambulatory Visit (INDEPENDENT_AMBULATORY_CARE_PROVIDER_SITE_OTHER): Payer: Self-pay | Admitting: "Endocrinology

## 2021-05-02 DIAGNOSIS — E034 Atrophy of thyroid (acquired): Secondary | ICD-10-CM

## 2021-05-03 DIAGNOSIS — J449 Chronic obstructive pulmonary disease, unspecified: Secondary | ICD-10-CM | POA: Diagnosis not present

## 2021-06-03 DIAGNOSIS — J449 Chronic obstructive pulmonary disease, unspecified: Secondary | ICD-10-CM | POA: Diagnosis not present

## 2021-06-18 NOTE — Progress Notes (Signed)
? Subjective:  ?Patient Name: Shelby Lin Date of Birth: August 29, 1940  MRN: [id number] ? ?Shelby Lin  presents to the office today for follow-up of her hypothyroidism secondary to Hashimoto's disease, questionable nodular goiter, tachycardia, tremor, dyspepsia, fatigue, fibromyalgia, hyperlipidemia, night sweats, tremor, and pre-diabetes. ? ?HISTORY OF PRESENT ILLNESS:  ? ?Nimrit is a 81 y.o. Caucasian woman.  Aevah was unaccompanied. ? ?1. Ms. Hyslop was referred to me on 03/25/2005 by her primary care provider, Dr. Carol Ada, for evaluation of her thyroid problems. Ms. Roes was 38 at that time.  ? A. At about age 68 the patient developed thyroid problems and was put on a thyroid medicine. Later when she became pregnant her obstetrician took her off that medicine. At about age 43, she developed severe fatigue and weight gain. She was put back on Synthroid. Her dose of Synthroid was 88 mcg per day for many years. In January 2006, she had fluctuations in her thyroid function tests. Synthroid was increased to 100 mcg and later to 125 mcg per day. The Synthroid dose was then reduced to 75 mcg. After blood tests about 9 months prior to her first appointment with me, the patient was put back on a dose of 88 mcg per day. When the patient was having fluctuations in her thyroid tests, her thyroid gland became visibly enlarged and felt full and tender to palpation. She was also more hoarse during those periods. Hoarseness came and went in parallel with the thyroid gland swelling. Ultrasound studies of the thyroid in January 2006 and January 2007 showed a multinodular goiter.  ? B. Thyroid function tests performed at that first visit showed a TSH of 4.139, free T4 1.22, and free T3 of 2.7. Because any TSH at that time greater than 3.0 was considered to be elevated physiologically, I increased her Synthroid from 88 to 100 mcg per day. TPO antibody was markedly positive at 954.0, c/w the diagnosis of Hashimoto's  thyroiditis. Her TSI level was quite normal at 0.9.  ? ?2. Clinical course: During the last 15 years the patient has had several medical issues that we have followed: ? A. She has had several flare-ups of Hashimoto's disease, resulting in several changes in her doses of Synthroid. Her Synthroid dose was subsequently decreased to 88 mcg/day in January 2018.  ? B. Although she was initially thought to have a multinodular goiter, over time it became clear that all but one of the nodules were actually areas of echotexture heterogeneity due to Hashimoto's thyroiditis. She has had one "nodularish" area of her left inferior pole that has remained unchanged since 2011. This area is ill-defined and may also just represent several overlapping areas of heterogeneity. ? C. She has also had problems with fibromyalgia, arthritis, hypertension, depression, fatigue, tremor, hyperlipidemia, reflux, dyspepsia, and vitamin D deficiency.  ? D. In August 2016 her HbA1c was 6.2%, c/w prediabetes. Since hen her HbA1c values have varied from 5.8% to 6.4%. The patient refused to take metformin because three of her relatives lost their scalp hair when they took metformin.   ? E. She had normal colonoscopy in January 2022.   ? F. She had bilateral cataractectomies in March and April 2022. Her vision is good now. ? G. She saw her cardiologist, Dr. Candee Furbish, MD, in April 2022. Her nuclear stress test was normal.   ? ?3. The patient's last [facility] visit was on 12/19/20. At that visit I continued her Synthroid dose of 88 mcg/day for 6 days each week,  but only 1/2 tablet on the 7th days and continued her MVI and Biotech. I continued her omeprazole daily.   ? A. In the interim she has been healthy. ?  1). She has recovered from her leg injury in July 2022. She still has some residual pain in the medial and lateral right thigh.  ?  2). She has not had any other health issues or changes in medications.  ? B. She still has chronic constipation.   Linzess caused diarrhea, so she now takes a stool softener daily and prunes.  ? C. Her Meniere's disease has been active recently when her allergies are acting up. quiescent recently.  ? D. Her COPD is about the same.  ? E. Her tension headaches are "about the same". Cervical stretching helps when she does her cervical stretching exercises regularly.  ? F. She is tired at times, but not as often. Her energy has been somewhat better. She says that she sleeps well. Her husband no longer complains about her snoring. She has not awakened gasping for breath.    ? G. She still has nocturnal hot flashes and night sweats occasionally.    ? H. She is doing "okay-good" emotionally.  Her family problems are unchanged.     ? I. She continues to have problems with fibromyalgia and arthritis in her wrists, hands, knees, and feet, worse in the cold weather.  ? H. Her feet are doing worse at times.     ? I. She is hoarse sometimes, but not a lot. The hoarseness still comes and goes in association with her allergies.  ? J. She remains on her Prilosec, 20 mg daily. She could not afford the copay for rabeprazole. She still takes Synthroid, 88 mcg/day for 6 days each week, but only 1/2 tablet on Sundays. She also takes losartan, 100 mg/day, an MVI and Biotech vitamin D, 50,000 IU/weekly. Her cardiologist increased her rosuvastatin to 20 mg/day.  ? K. She has reduced her carb intake.  ? ?4. Pertinent Review of Systems:  ?Constitutional: "I feel fine." Some days are better than others.  Her stamina and energy are about the same. The SOB is about the same.  ?Eyes: As above. Vision is good as long as she wears her glasses.  ?Neck: The thyroid gland has not seemed to swell lately. The patient has no complaints of anterior neck soreness, tenderness,  pressure, discomfort, or difficulty swallowing.  ?Heart: She has not had any other chest symptoms. Heart rate increases with exercise or other physical activity. The patient has no other  complaints of palpitations, irregular heat beats, chest pain, or chest pressure. ?Gastrointestinal: As above. She also sometimes feels like food sticks in her esophagus or that she has a spasm of the esophagus  ?Hands: She still has her tremor.  ?Legs: As above. Muscle mass and strength seem normal. Legs are stronger when she walks more. She no longer has episodic leg numbness and burning. She has had some edema occasionally, more in the left leg.  ?Feet: Her feet still burn in the forefeet at times.    ?GYN: She still gets hot flashes, but much less frequently and much less intensely.    ?Neuro: Her neuropathy in her feet is about the same.  ?Emotional/psychiatric: She feels "great". ?Mental: She does "well" at thinking, paying attention, remembering, and making decisions. "I still have all my marbles." She remembers to tell me that same sentence every time she visits.  ?  ?PAST MEDICAL, FAMILY,  AND SOCIAL HISTORY: ? ?Past Medical History:  ?Diagnosis Date  ? Anxiety   ? Arthritis   ? Asthma in child   ? Atherosclerotic heart disease of native coronary artery without angina pectoris   ? Cataracts, bilateral   ? Combined hyperlipidemia   ? Constipation, unspecified   ? COPD (chronic obstructive pulmonary disease) ([place])   ? Coronary artery spasm ([place])   ? Diabetic neuropathy ([place])   ? DM (diabetes mellitus) with complications ([place])   ? Dupuytren's contracture   ? Dyspepsia   ? Familial tremor   ? Fatigue   ? Fibromyalgia syndrome   ? GERD (gastroesophageal reflux disease)   ? Hashimoto's thyroiditis   ? Hiatal hernia   ? History of colon polyps   ? Hx of Hashimoto thyroiditis   ? Hypercholesteremia   ? Hypertension   ? Hypothyroidism, acquired, autoimmune   ? IBS (irritable bowel syndrome)   ? Meniere's disease of left ear   ? Migraine without aura and without status migrainosus, not intractable   ? Multinodular goiter (nontoxic)   ? Nonexudative macular degeneration   ? Raynaud's syndrome without gangrene   ?  Situational stress   ? Tachycardia   ? Thyroiditis, autoimmune   ? Ulcerative colitis, unspecified, without complications ([place])   ? Vitamin B 12 deficiency   ? Vitamin D deficiency   ? Wears glasses   ? ? ?Family History  ?Probl

## 2021-06-19 ENCOUNTER — Ambulatory Visit (INDEPENDENT_AMBULATORY_CARE_PROVIDER_SITE_OTHER): Payer: Medicare Other | Admitting: "Endocrinology

## 2021-06-19 ENCOUNTER — Encounter (INDEPENDENT_AMBULATORY_CARE_PROVIDER_SITE_OTHER): Payer: Self-pay | Admitting: "Endocrinology

## 2021-06-19 VITALS — BP 130/70 | HR 72 | Wt 159.0 lb

## 2021-06-19 DIAGNOSIS — H8102 Meniere's disease, left ear: Secondary | ICD-10-CM | POA: Diagnosis not present

## 2021-06-19 DIAGNOSIS — R7303 Prediabetes: Secondary | ICD-10-CM | POA: Diagnosis not present

## 2021-06-19 DIAGNOSIS — E559 Vitamin D deficiency, unspecified: Secondary | ICD-10-CM | POA: Diagnosis not present

## 2021-06-19 DIAGNOSIS — K219 Gastro-esophageal reflux disease without esophagitis: Secondary | ICD-10-CM | POA: Diagnosis not present

## 2021-06-19 DIAGNOSIS — R251 Tremor, unspecified: Secondary | ICD-10-CM

## 2021-06-19 DIAGNOSIS — E049 Nontoxic goiter, unspecified: Secondary | ICD-10-CM

## 2021-06-19 DIAGNOSIS — R5383 Other fatigue: Secondary | ICD-10-CM

## 2021-06-19 DIAGNOSIS — E063 Autoimmune thyroiditis: Secondary | ICD-10-CM

## 2021-06-19 DIAGNOSIS — R1013 Epigastric pain: Secondary | ICD-10-CM | POA: Diagnosis not present

## 2021-06-19 DIAGNOSIS — E663 Overweight: Secondary | ICD-10-CM

## 2021-06-19 DIAGNOSIS — N951 Menopausal and female climacteric states: Secondary | ICD-10-CM

## 2021-06-19 NOTE — Patient Instructions (Signed)
Follow up visit.  ? ?At [facility], we are committed to providing exceptional care. You will receive a patient satisfaction survey through text or email regarding your visit today. Your opinion is important to me. Comments are appreciated. ? ?

## 2021-06-20 LAB — PTH, INTACT AND CALCIUM
Calcium: 10 mg/dL (ref 8.6–10.4)
PTH: 36 pg/mL (ref 16–77)

## 2021-06-20 LAB — HEMOGLOBIN A1C
Hgb A1c MFr Bld: 6.3 % of total Hgb — ABNORMAL HIGH (ref <5.7)
Mean Plasma Glucose: 134 mg/dL
eAG (mmol/L): 7.4 mmol/L

## 2021-06-20 LAB — T4, FREE: Free T4: 1.4 ng/dL (ref 0.8–1.8)

## 2021-06-20 LAB — T3, FREE: T3, Free: 3.1 pg/mL (ref 2.3–4.2)

## 2021-06-20 LAB — VITAMIN D 25 HYDROXY (VIT D DEFICIENCY, FRACTURES): Vit D, 25-Hydroxy: 45 ng/mL (ref 30–100)

## 2021-06-20 LAB — TSH: TSH: 1.77 mIU/L (ref 0.40–4.50)

## 2021-07-03 DIAGNOSIS — J449 Chronic obstructive pulmonary disease, unspecified: Secondary | ICD-10-CM | POA: Diagnosis not present

## 2021-07-16 ENCOUNTER — Other Ambulatory Visit: Payer: Self-pay | Admitting: Family Medicine

## 2021-07-16 ENCOUNTER — Ambulatory Visit
Admission: RE | Admit: 2021-07-16 | Discharge: 2021-07-16 | Disposition: A | Discharge status: Home / Self Care | Payer: Medicare Other | Source: Ambulatory Visit | Attending: Family Medicine | Admitting: Family Medicine

## 2021-07-16 DIAGNOSIS — R0789 Other chest pain: Secondary | ICD-10-CM

## 2021-07-16 DIAGNOSIS — R918 Other nonspecific abnormal finding of lung field: Secondary | ICD-10-CM | POA: Diagnosis not present

## 2021-07-16 DIAGNOSIS — M549 Dorsalgia, unspecified: Secondary | ICD-10-CM | POA: Diagnosis not present

## 2021-08-03 DIAGNOSIS — J449 Chronic obstructive pulmonary disease, unspecified: Secondary | ICD-10-CM | POA: Diagnosis not present

## 2021-08-20 DIAGNOSIS — G8929 Other chronic pain: Secondary | ICD-10-CM | POA: Diagnosis not present

## 2021-08-20 DIAGNOSIS — I10 Essential (primary) hypertension: Secondary | ICD-10-CM | POA: Diagnosis not present

## 2021-08-20 DIAGNOSIS — E039 Hypothyroidism, unspecified: Secondary | ICD-10-CM | POA: Diagnosis not present

## 2021-08-20 DIAGNOSIS — E7800 Pure hypercholesterolemia, unspecified: Secondary | ICD-10-CM | POA: Diagnosis not present

## 2021-08-20 DIAGNOSIS — J449 Chronic obstructive pulmonary disease, unspecified: Secondary | ICD-10-CM | POA: Diagnosis not present

## 2021-08-20 DIAGNOSIS — E1169 Type 2 diabetes mellitus with other specified complication: Secondary | ICD-10-CM | POA: Diagnosis not present

## 2021-09-02 DIAGNOSIS — J449 Chronic obstructive pulmonary disease, unspecified: Secondary | ICD-10-CM | POA: Diagnosis not present

## 2021-09-18 DIAGNOSIS — M797 Fibromyalgia: Secondary | ICD-10-CM | POA: Diagnosis not present

## 2021-09-18 DIAGNOSIS — I2510 Atherosclerotic heart disease of native coronary artery without angina pectoris: Secondary | ICD-10-CM | POA: Diagnosis not present

## 2021-09-18 DIAGNOSIS — J449 Chronic obstructive pulmonary disease, unspecified: Secondary | ICD-10-CM | POA: Diagnosis not present

## 2021-09-18 DIAGNOSIS — E1169 Type 2 diabetes mellitus with other specified complication: Secondary | ICD-10-CM | POA: Diagnosis not present

## 2021-09-18 DIAGNOSIS — E039 Hypothyroidism, unspecified: Secondary | ICD-10-CM | POA: Diagnosis not present

## 2021-09-18 DIAGNOSIS — Z23 Encounter for immunization: Secondary | ICD-10-CM | POA: Diagnosis not present

## 2021-09-18 DIAGNOSIS — I10 Essential (primary) hypertension: Secondary | ICD-10-CM | POA: Diagnosis not present

## 2021-09-18 DIAGNOSIS — K219 Gastro-esophageal reflux disease without esophagitis: Secondary | ICD-10-CM | POA: Diagnosis not present

## 2021-09-19 ENCOUNTER — Encounter: Payer: Self-pay | Admitting: Cardiology

## 2021-09-19 ENCOUNTER — Ambulatory Visit: Payer: Medicare Other | Admitting: Cardiology

## 2021-09-19 VITALS — BP 112/78 | HR 87 | Ht 64.5 in | Wt 157.2 lb

## 2021-09-19 DIAGNOSIS — K219 Gastro-esophageal reflux disease without esophagitis: Secondary | ICD-10-CM

## 2021-09-19 DIAGNOSIS — I2584 Coronary atherosclerosis due to calcified coronary lesion: Secondary | ICD-10-CM | POA: Diagnosis not present

## 2021-09-19 DIAGNOSIS — I2510 Atherosclerotic heart disease of native coronary artery without angina pectoris: Secondary | ICD-10-CM | POA: Diagnosis not present

## 2021-09-19 NOTE — Progress Notes (Signed)
Cardiology Office Note:    Date:  09/19/2021   ID:  SHANECIA HOGANSON, DOB 1940/07/30, MRN [id number]  PCP:  Carol Ada, MD   [facility] Providers Cardiologist:  Candee Furbish, MD     Referring MD: Carol Ada, MD    History of Present Illness:    Shelby Lin is a 81 y.o. female here for the follow-up of coronary artery calcification with prior heart catheterization 2014 showing no flow-limiting CAD.  She had had spells of jaw discomfort relieved with Tums.  Father had GERD ended up having CAD and dying suddenly of heart attack.  She also has a history of breast cancer.  GERD - sees Dr. Paulita Fujita.   TOB quit 2000.   Overall been doing quite well.  She does have some trouble walking with foot pain.  Has fibromyalgia.  She unfortunately fell through her granddaughters deck with her right leg.  Had some residual bruising, pigment staining.  On the right side she states that her granddaughter built a beautiful new deck with a sun screen porch  Past Medical History:  Diagnosis Date   Anxiety    Arthritis    Asthma in child    Atherosclerotic heart disease of native coronary artery without angina pectoris    Cataracts, bilateral    Combined hyperlipidemia    Constipation, unspecified    COPD (chronic obstructive pulmonary disease) (H[facility])    Coronary artery spasm ([facility])    Diabetic neuropathy ([place])    DM (diabetes mellitus) with complications ([place])    Dupuytren's contracture    Dyspepsia    Familial tremor    Fatigue    Fibromyalgia syndrome    GERD (gastroesophageal reflux disease)    Hashimoto's thyroiditis    Hiatal hernia    History of colon polyps    Hx of Hashimoto thyroiditis    Hypercholesteremia    Hypertension    Hypothyroidism, acquired, autoimmune    IBS (irritable bowel syndrome)    Meniere's disease of left ear    Migraine without aura and without status migrainosus, not intractable    Multinodular goiter (nontoxic)    Nonexudative macular  degeneration    Raynaud's syndrome without gangrene    Situational stress    Tachycardia    Thyroiditis, autoimmune    Ulcerative colitis, unspecified, without complications (H[facility])    Vitamin B 12 deficiency    Vitamin D deficiency    Wears glasses     Past Surgical History:  Procedure Laterality Date   BREAST EXCISIONAL BIOPSY Left    benign   BREAST EXCISIONAL BIOPSY Right    milk gland removed   BREAST LUMPECTOMY  1990   left   BREAST LUMPECTOMY WITH NEEDLE LOCALIZATION Right 02/28/2013   Procedure: RIGHT BREAST NEEDLE LOCALIZATION LUMPECTOMY ;  Surgeon: Edward Jolly, MD;  Location: [place];  Service: General;  Laterality: Right;   CHOLECYSTECTOMY  2008   lapcholi   DILATION AND CURETTAGE OF UTERUS     LEFT HEART CATHETERIZATION WITH CORONARY ANGIOGRAM N/A 01/26/2013   Procedure: LEFT HEART CATHETERIZATION WITH CORONARY ANGIOGRAM;  Surgeon: Candee Furbish, MD;  Location: [facility] CATH LAB;  Service: Cardiovascular;  Laterality: N/A;   PARTIAL HYSTERECTOMY  1971   TONSILLECTOMY      Current Medications: Current Meds  Medication Sig   albuterol (VENTOLIN HFA) 108 (90 Base) MCG/ACT inhaler Inhale 1-2 puffs into the lungs every 6 (six) hours as needed for wheezing or shortness of breath.  aspirin EC 81 MG tablet Take 1 tablet (81 mg total) by mouth daily.   benzonatate (TESSALON) 100 MG capsule Take 100 mg by mouth 3 (three) times daily as needed.   Cyanocobalamin (VITAMIN B 12 PO) Take by mouth daily.   DULoxetine (CYMBALTA) 30 MG capsule Take 30 mg by mouth 2 (two) times daily.   fluticasone (FLONASE) 50 MCG/ACT nasal spray Place 1 spray into both nostrils daily.   furosemide (LASIX) 20 MG tablet Take 20 mg by mouth as needed.   hydrochlorothiazide (HYDRODIURIL) 25 MG tablet TAKE 1 TABLET BY MOUTH EVERY DAY IN THE MORNING   LORazepam (ATIVAN) 0.5 MG tablet TAKE 1/2 TAB EVERY 8 12 HOURS AS NEEDED FOR ANXIOUSNESS   losartan (COZAAR) 100 MG tablet Take 100 mg by  mouth daily.   meclizine (ANTIVERT) 25 MG tablet 1/2 to 1 tablet by mouth every 8 hrs as needed for diziness   Multiple Vitamin (MULTIVITAMIN) tablet Take 1 tablet by mouth daily.   Multiple Vitamins-Minerals (PRESERVISION AREDS) TABS See admin instructions.   omeprazole (PRILOSEC) 20 MG capsule Take 20 mg by mouth daily.   Respiratory Therapy Supplies (FLUTTER) DEVI Use device 2-3 times a day to break up congestion   rosuvastatin (CRESTOR) 20 MG tablet Take 1 tablet (20 mg total) by mouth daily.   SYNTHROID 88 MCG tablet TAKE DAILY BEFORE BREAKFAST. 1 TABLET 6 DAYS EACH WEEK, BUT TAKE ONLY 1/2 TABLET ON THE 7TH DAY   TRELEGY ELLIPTA 100-62.5-25 MCG/ACT AEPB INHALE 1 PUFF BY MOUTH EVERY DAY   VITAMIN D, CHOLECALCIFEROL, PO Take 50,000 Units by mouth once a week.      Allergies:   Acyclovir and related, Allegra [fexofenadine], Amoxicillin, Codeine, Elemental sulfur, Enalapril maleate, Hydrocodone bit-homatrop mbr, Keflex [cephalexin], Levofloxacin, Lorabid [loracarbef], Other, and Sulfa antibiotics   Social History   Socioeconomic History   Marital status: Married    Spouse name: edward   Number of children: 2   Years of education: 12th   Highest education level: Not on file  Occupational History    Employer: [employer]  Tobacco Use   Smoking status: Former    Packs/day: 0.50    Years: 42.00    Total pack years: 21.00    Types: Cigarettes    Quit date: 02/21/2001    Years since quitting: 20.5   Smokeless tobacco: Never  Vaping Use   Vaping Use: Never used  Substance and Sexual Activity   Alcohol use: No   Drug use: No   Sexual activity: Not on file  Other Topics Concern   Not on file  Social History Narrative   Patient lives at home with the husband.. Patient drinks coffee   Social Determinants of Health   Financial Resource Strain: Not on file  Food Insecurity: Not on file  Transportation Needs: Not on file  Physical Activity: Not on file  Stress: Not on  file  Social Connections: Not on file     Family History: The patient's family history includes Cancer in her maternal uncle, mother, and paternal aunt; Diabetes in her sister; Heart attack in her father; Hypertension in her mother and sister; Stroke in her mother and sister; Sudden death in her father. There is no history of Thyroid disease.  ROS:   Please see the history of present illness.     All other systems reviewed and are negative.  EKGs/Labs/Other Studies Reviewed:    The following studies were reviewed today:   Cardiac catheterization 01/26/2013-no angiographically  significant CAD, normal EF, left ventricular end-diastolic pressure 14.  NUC stress - 06/12/2020 showed no ischemia, low risk was normal.  Normal ejection fraction.  EKG:  EKG is  ordered today.  The ekg ordered today demonstrates sinus rhythm PVC 87 bpm  Recent Labs: 06/19/2021: TSH 1.77  Recent Lipid Panel    Component Value Date/Time   CHOL 134 08/31/2018 0919   TRIG 103 08/31/2018 0919   HDL 72 08/31/2018 0919   CHOLHDL 1.9 08/31/2018 0919   LDLCALC 41 08/31/2018 0919     Risk Assessment/Calculations:              Physical Exam:    VS:  BP 112/78   Pulse 87   Ht 5' 4.5" (1.638 m)   Wt 157 lb 3.2 oz (71.3 kg)   SpO2 (!) 87%   BMI 26.57 kg/m     Wt Readings from Last 3 Encounters:  09/19/21 157 lb 3.2 oz (71.3 kg)  06/19/21 159 lb (72.1 kg)  02/26/21 160 lb (72.6 kg)     GEN:  Well nourished, well developed in no acute distress HEENT: Normal NECK: No JVD; No carotid bruits LYMPHATICS: No lymphadenopathy CARDIAC: RRR, no murmurs, no rubs, gallops RESPIRATORY:  Clear to auscultation without rales, wheezing or rhonchi  ABDOMEN: Soft, non-tender, non-distended MUSCULOSKELETAL:  No edema; No deformity  SKIN: Warm and dry NEUROLOGIC:  Alert and oriented x 3 PSYCHIATRIC:  Normal affect   ASSESSMENT:    1. Atherosclerosis of native coronary artery of native heart without angina  pectoris   2. Coronary artery calcification   3. Gastroesophageal reflux disease without esophagitis    PLAN:    In order of problems listed above:  Chest discomfort/GERD - Nuclear stress test performed on 06/12/2020 showed no ischemia, low risk was normal.  Normal ejection fraction.  Coronary artery calcification - Continue with high intensity statin prior LDL excellent in the 40s.  Protective.  Family history of coronary artery disease - Sister had CABG and stroke, mother had angina and atrial fibrillation, father had sudden cardiac death after thinking he had GERD.  Continue with protective measures.  COPD - Quit smoking in 2002 after about 20 pack years.  Now uses 2 L of home O2 at night.       Medication Adjustments/Labs and Tests Ordered: Current medicines are reviewed at length with the patient today.  Concerns regarding medicines are outlined above.  Orders Placed This Encounter  Procedures   EKG 12-Lead   No orders of the defined types were placed in this encounter.   Patient Instructions  Medication Instructions:  The current medical regimen is effective;  continue present plan and medications.  *If you need a refill on your cardiac medications before your next appointment, please call your pharmacy*  Follow-Up: At [facility], you and your health needs are our priority.  As part of our continuing mission to provide you with exceptional heart care, we have created designated Provider Care Teams.  These Care Teams include your primary Cardiologist (physician) and Advanced Practice Providers (APPs -  Physician Assistants and Nurse Practitioners) who all work together to provide you with the care you need, when you need it.  We recommend signing up for the patient portal called "MyChart".  Sign up information is provided on this After Visit Summary.  MyChart is used to connect with patients for Virtual Visits (Telemedicine).  Patients are able to view lab/test  results, encounter notes, upcoming appointments, etc.  Non-urgent messages  can be sent to your provider as well.   To learn more about what you can do with MyChart, go to [email or web address].    Your next appointment:   1 year(s)  The format for your next appointment:   In Person  Provider:   Candee Furbish, MD {  Important Information About Sugar         Signed, Candee Furbish, MD  09/19/2021 10:26 AM    [place]

## 2021-09-19 NOTE — Patient Instructions (Signed)

## 2021-09-26 ENCOUNTER — Encounter (INDEPENDENT_AMBULATORY_CARE_PROVIDER_SITE_OTHER): Payer: Self-pay

## 2021-10-03 DIAGNOSIS — J449 Chronic obstructive pulmonary disease, unspecified: Secondary | ICD-10-CM | POA: Diagnosis not present

## 2021-10-07 DIAGNOSIS — K219 Gastro-esophageal reflux disease without esophagitis: Secondary | ICD-10-CM | POA: Diagnosis not present

## 2021-10-07 DIAGNOSIS — K5900 Constipation, unspecified: Secondary | ICD-10-CM | POA: Diagnosis not present

## 2021-10-09 ENCOUNTER — Encounter (INDEPENDENT_AMBULATORY_CARE_PROVIDER_SITE_OTHER): Payer: Self-pay

## 2021-10-11 ENCOUNTER — Other Ambulatory Visit: Payer: Self-pay | Admitting: Family Medicine

## 2021-10-11 DIAGNOSIS — Z1231 Encounter for screening mammogram for malignant neoplasm of breast: Secondary | ICD-10-CM

## 2021-10-16 DIAGNOSIS — R5383 Other fatigue: Secondary | ICD-10-CM | POA: Diagnosis not present

## 2021-10-16 DIAGNOSIS — H6122 Impacted cerumen, left ear: Secondary | ICD-10-CM | POA: Diagnosis not present

## 2021-10-31 ENCOUNTER — Ambulatory Visit (INDEPENDENT_AMBULATORY_CARE_PROVIDER_SITE_OTHER): Payer: Medicare Other | Admitting: "Endocrinology

## 2021-10-31 ENCOUNTER — Encounter (INDEPENDENT_AMBULATORY_CARE_PROVIDER_SITE_OTHER): Payer: Self-pay | Admitting: "Endocrinology

## 2021-10-31 VITALS — BP 118/70 | HR 88 | Wt 157.0 lb

## 2021-10-31 DIAGNOSIS — R7303 Prediabetes: Secondary | ICD-10-CM

## 2021-10-31 DIAGNOSIS — I10 Essential (primary) hypertension: Secondary | ICD-10-CM

## 2021-10-31 DIAGNOSIS — E559 Vitamin D deficiency, unspecified: Secondary | ICD-10-CM | POA: Diagnosis not present

## 2021-10-31 DIAGNOSIS — G44229 Chronic tension-type headache, not intractable: Secondary | ICD-10-CM | POA: Diagnosis not present

## 2021-10-31 DIAGNOSIS — E063 Autoimmune thyroiditis: Secondary | ICD-10-CM

## 2021-10-31 DIAGNOSIS — E782 Mixed hyperlipidemia: Secondary | ICD-10-CM | POA: Diagnosis not present

## 2021-10-31 DIAGNOSIS — E049 Nontoxic goiter, unspecified: Secondary | ICD-10-CM | POA: Diagnosis not present

## 2021-10-31 DIAGNOSIS — M797 Fibromyalgia: Secondary | ICD-10-CM

## 2021-10-31 NOTE — Progress Notes (Signed)
Subjective:  Patient Name: Shelby Lin Date of Birth: 01/25/1941  MRN: [id number]  Shelby Lin  presents to the office today for follow-up of her hypothyroidism secondary to Hashimoto's disease, questionable nodular goiter, tachycardia, tremor, dyspepsia, fatigue, fibromyalgia, hyperlipidemia, night sweats, tremor, and pre-diabetes.  HISTORY OF PRESENT ILLNESS:   Shelby Lin is a 81 y.o. Caucasian woman.  Shelby Lin was unaccompanied.  1. Shelby Lin was referred to me on 03/25/2005 by her primary care provider, Dr. Carol Ada, for evaluation of her thyroid problems. Shelby Lin was 42 at that time.   A. At about age 33 the patient developed thyroid problems and was put on a thyroid medicine. Later when she became pregnant her obstetrician took her off that medicine. At about age 45, she developed severe fatigue and weight gain. She was put back on Synthroid. Her dose of Synthroid was 88 mcg per day for many years. In January 2006, she had fluctuations in her thyroid function tests. Synthroid was increased to 100 mcg and later to 125 mcg per day. The Synthroid dose was then reduced to 75 mcg. After blood tests about 9 months prior to her first appointment with me, the patient was put back on a dose of 88 mcg per day. When the patient was having fluctuations in her thyroid tests, her thyroid gland became visibly enlarged and felt full and tender to palpation. She was also more hoarse during those periods. Hoarseness came and went in parallel with the thyroid gland swelling. Ultrasound studies of the thyroid in January 2006 and January 2007 showed a multinodular goiter.   B. Thyroid function tests performed at that first visit showed a TSH of 4.139, free T4 1.22, and free T3 of 2.7. Because any TSH at that time greater than 3.0 was considered to be elevated physiologically, I increased her Synthroid from 88 to 100 mcg per day. TPO antibody was markedly positive at 954.0, c/w the diagnosis of Hashimoto's  thyroiditis. Her TSI level was quite normal at 0.9.   2. Clinical course: During the last 16 years the patient has had several medical issues that we have followed:  A. She has had several flare-ups of Hashimoto's disease, resulting in several changes in her doses of Synthroid. Her Synthroid dose was subsequently decreased to 88 mcg/day in January 2018.   B. Although she was initially thought to have a multinodular goiter, over time it became clear that all but one of the nodules were actually areas of echotexture heterogeneity due to Hashimoto's thyroiditis. She has had one "nodularish" area of her left inferior pole that has remained unchanged since 2011. This area is ill-defined and may also just represent several overlapping areas of heterogeneity.  C. She has also had problems with fibromyalgia, arthritis, hypertension, depression, fatigue, tremor, hyperlipidemia, reflux, dyspepsia, and vitamin D deficiency.   D. In August 2016 her HbA1c was 6.2%, c/w prediabetes. Since then her HbA1c values have varied from 5.8% to 6.4%. The patient refused to take metformin because three of her relatives lost their scalp hair when they took metformin.    E. She had normal colonoscopy in January 2022.    F. She had bilateral cataractectomies in March and April 2022. Her vision is good now.  G. She saw her cardiologist, Dr. Candee Furbish, MD, in April 2022. Her nuclear stress test was normal.    3. The patient's last [facility] visit was on 06/19/21. At that visit I continued her Synthroid dose of 88 mcg/day for 6 days each week,  but only 1/2 tablet on the 7th days and continued her MVI, Prilosec,  and Biotech.   A. In the interim she had been healthy until about 2 weeks ago: 1). She began to feel unusually tired about two weeks ago. She has less energy, but sleeps well. She is not unusually hot or cold. She continues to have constipation. When she saw the PA at her PCP's office, her TSH was. 4.94.  2). She is having more  headaches. The HAs are nucha and occipital. Stretching helps.  3). She has not had any other health issues or changes in medications.    4). She saw Dr. Marlou Porch in cardiology on 09/19/21. He continued her current medications. She will have a follow up exam in one year.  B. She still has chronic constipation.  She is taking Linzess again. She also takes a stool softener and prunes as needed.  C. Her Meniere's disease has been active recently when her allergies are acting up.    D. Her COPD is doing well. She has not had to use her rescue inhaler in a long time. She still uses oxygen at night.   E. Her tension headaches are "about the same". Cervical stretching helps when she does her cervical stretching exercises regularly.   F. She still has nocturnal hot flashes and night sweats occasionally.     G. She is doing "well" emotionally.  "I have the usual every day problems."   H. She continues to have problems with fibromyalgia and arthritis in her wrists, hands, knees, and feet, worse in the cold weather.   I. Her feet hurt "terribly" if she walks much.       J. She is hoarse more often now. The hoarseness still comes and goes in association with her allergies.   K. She remains on her omeprazole 20 mg daily. She could not afford the copay for rabeprazole. She still takes Synthroid, 88 mcg/day for 6 days each week, but only 1/2 tablet on Sundays. She also takes losartan, 100 mg/day, an MVI and Biotech vitamin D, 50,000 IU/weekly. Her cardiologist increased her rosuvastatin to 20 mg/day. She also takes HCTZ.  L. She has reduced her carb intake.   4. Pertinent Review of Systems:  Constitutional: "I feel alright." Some days are better than others.  Her stamina and energy are about the same. The SOB is about the same.  Eyes: As above. Vision is good as long as she wears her glasses.  Neck: The thyroid gland has not seemed to swell lately. The patient has no complaints of anterior neck soreness, tenderness,   pressure, discomfort, or difficulty swallowing.  Heart: She has not had any other chest symptoms. Heart rate increases with exercise or other physical activity. The patient has no other complaints of palpitations, irregular heat beats, chest pain, or chest pressure. Gastrointestinal: As above. She does not have much GERD or dyspepsia. She no longer feels like food sticks in her esophagus or that she has a spasm of the esophagus  Hands: She still has her tremor.  Legs: As above. Muscle mass and strength seem normal. Legs are stronger when she walks more. She no longer has episodic leg numbness and burning. She has had some edema occasionally, more in the left leg.  Feet: Her feet still burn in the forefeet at times.    GYN: She still gets hot flashes, but much less frequently and much less intensely.    Neuro: Her neuropathy in her feet is  about the same.  Emotional/psychiatric: She feels "great". Mental: She does "well" at thinking, paying attention, remembering, and making decisions. "I still have all my marbles." She remembers to tell me that same sentence every time she visits. She also remembered to ask about my wife, who was our nurse for many years.    PAST MEDICAL, FAMILY, AND SOCIAL HISTORY:  Past Medical History:  Diagnosis Date   Anxiety    Arthritis    Asthma in child    Atherosclerotic heart disease of native coronary artery without angina pectoris    Cataracts, bilateral    Combined hyperlipidemia    Constipation, unspecified    COPD (chronic obstructive pulmonary disease) (HCC)    Coronary artery spasm (HCC)    Diabetic neuropathy (HCC)    DM (diabetes mellitus) with complications (HCC)    Dupuytren's contracture    Dyspepsia    Familial tremor    Fatigue    Fibromyalgia syndrome    GERD (gastroesophageal reflux disease)    Hashimoto's thyroiditis    Hiatal hernia    History of colon polyps    Hx of Hashimoto thyroiditis    Hypercholesteremia    Hypertension     Hypothyroidism, acquired, autoimmune    IBS (irritable bowel syndrome)    Meniere's disease of left ear    Migraine without aura and without status migrainosus, not intractable    Multinodular goiter (nontoxic)    Nonexudative macular degeneration    Raynaud's syndrome without gangrene    Situational stress    Tachycardia    Thyroiditis, autoimmune    Ulcerative colitis, unspecified, without complications ([place])    Vitamin B 12 deficiency    Vitamin D deficiency    Wears glasses     Family History  Problem Relation Age of Onset   Cancer Mother    Hypertension Mother    Stroke Mother    Heart attack Father    Sudden death Father    Hypertension Sister    Diabetes Sister    Stroke Sister    Cancer Maternal Uncle        breast,    Cancer Paternal Aunt        breast, ovarian   Thyroid disease Neg Hx      Current Outpatient Medications:    albuterol (VENTOLIN HFA) 108 (90 Base) MCG/ACT inhaler, Inhale 1-2 puffs into the lungs every 6 (six) hours as needed for wheezing or shortness of breath., Disp: 18 g, Rfl: 6   aspirin EC 81 MG tablet, Take 1 tablet (81 mg total) by mouth daily., Disp: , Rfl:    benzonatate (TESSALON) 100 MG capsule, Take 100 mg by mouth 3 (three) times daily as needed., Disp: , Rfl:    Cyanocobalamin (VITAMIN B 12 PO), Take by mouth daily., Disp: , Rfl:    DULoxetine (CYMBALTA) 30 MG capsule, Take 30 mg by mouth 2 (two) times daily., Disp: , Rfl:    fluticasone (FLONASE) 50 MCG/ACT nasal spray, Place 1 spray into both nostrils daily., Disp: 16 g, Rfl: 2   furosemide (LASIX) 20 MG tablet, Take 20 mg by mouth as needed., Disp: , Rfl:    hydrochlorothiazide (HYDRODIURIL) 25 MG tablet, TAKE 1 TABLET BY MOUTH EVERY DAY IN THE MORNING, Disp: , Rfl: 1   linaclotide (LINZESS) 72 MCG capsule, 1 capsule at least 30 minutes before the first meal of the day on an empty stomach, Disp: , Rfl:    LORazepam (ATIVAN) 0.5 MG tablet, TAKE 1/2  TAB EVERY 8 12 HOURS AS NEEDED FOR  ANXIOUSNESS, Disp: , Rfl:    losartan (COZAAR) 100 MG tablet, Take 100 mg by mouth daily., Disp: , Rfl: 1   meclizine (ANTIVERT) 25 MG tablet, 1/2 to 1 tablet by mouth every 8 hrs as needed for diziness, Disp: , Rfl:    Multiple Vitamin (MULTIVITAMIN) tablet, Take 1 tablet by mouth daily., Disp: , Rfl:    Multiple Vitamins-Minerals (PRESERVISION AREDS) TABS, See admin instructions., Disp: , Rfl:    omeprazole (PRILOSEC) 20 MG capsule, Take 20 mg by mouth daily., Disp: , Rfl:    Respiratory Therapy Supplies (FLUTTER) DEVI, Use device 2-3 times a day to break up congestion, Disp: 1 each, Rfl: 0   rosuvastatin (CRESTOR) 20 MG tablet, Take 1 tablet (20 mg total) by mouth daily., Disp: 90 tablet, Rfl: 3   SYNTHROID 88 MCG tablet, TAKE DAILY BEFORE BREAKFAST. 1 TABLET 6 DAYS EACH WEEK, BUT TAKE ONLY 1/2 TABLET ON THE 7TH DAY, Disp: 30 tablet, Rfl: 5   TRELEGY ELLIPTA 100-62.5-25 MCG/ACT AEPB, INHALE 1 PUFF BY MOUTH EVERY DAY, Disp: 60 each, Rfl: 11   VITAMIN D, CHOLECALCIFEROL, PO, Take 50,000 Units by mouth once a week. , Disp: , Rfl:    RABEprazole (ACIPHEX) 20 MG tablet, Take one tablet twice daily., Disp: 180 tablet, Rfl: 3  Allergies as of 10/31/2021 - Review Complete 09/19/2021  Allergen Reaction Noted   Acyclovir and related  04/01/2017   Allegra [fexofenadine] Hives 04/28/2018   Amoxicillin Hives 06/18/2010   Codeine Nausea And Vomiting 02/21/2013   Elemental sulfur Hives 09/08/2016   Enalapril maleate Cough 04/28/2018   Hydrocodone bit-homatrop mbr Other (See Comments) 04/06/2020   Keflex [cephalexin] Hives 04/28/2018   Levofloxacin Other (See Comments) 04/28/2018   Lorabid [loracarbef] Hives 06/18/2010   Misc. sulfonamide containing compounds  10/31/2021   Other Other (See Comments) 04/06/2020   Sulfa antibiotics Hives 06/18/2010    1. Work and Family: She retired in June 2013, but later went back to work part-time. She stopped part-time work in May 2020.  Her husband retired at the  same time.    2. Activities: She has been walking much outside.   3. Smoking, alcohol, or drugs: None 4. Primary Care Provider: Dr. Carol Ada, [facility] [place]: There are no other significant problems involving Aairah's other body systems.   Objective:  Vital Signs:  BP 118/70   Pulse 88   Wt 157 lb (71.2 kg)   BMI 26.53 kg/m   Wt Readings from Last 3 Encounters:  10/31/21 157 lb (71.2 kg)  09/19/21 157 lb 3.2 oz (71.3 kg)  06/19/21 159 lb (72.1 kg)    Ht Readings from Last 3 Encounters:  09/19/21 5' 4.5" (1.638 m)  02/26/21 5' 4.5" (1.638 m)  01/08/21 '5\' 4"'$  (1.626 m)    HC Readings from Last 3 Encounters:  No data found for [facility]   Facility age limit for growth %iles is 20 years.  Body mass index is 26.53 kg/m. Facility age limit for growth %iles is 20 years.  Body surface area is 1.8 meters squared.  PHYSICAL EXAMINATION  Constitutional: Shelby Lin looks great today. She is alert, bright, and upbeat. Her affect and insight are normal. Her weight has decreased 2 pounds in the past 5 months. She looks much younger than her age.  Eyes: There is no obvious arcus or proptosis. Moisture appears normal. Mouth: The oropharynx and tongue appear normal. Oral moisture  is normal. There is no oral hyperpigmentation. Neck: The neck appears to be visibly normal. No carotid bruits are noted. The thyroid gland is again within normal limits at 18-20 grams in size. The thyroid gland is not tender to palpation. Lungs: The lungs are clear to auscultation. Air movement is good.   Heart: Heart rate and rhythm are regular. Heart sounds S1 and S2 are normal. I did not appreciate any pathologic cardiac murmurs. Abdomen: The abdomen is enlarged. Bowel sounds are normal. There is no obvious hepatomegaly, splenomegaly, or other mass effect. The abdomen is not tender to palpation. Arms: Muscle size and bulk are normal for age.  Hands: There is a 1+ tremor.  Phalangeal and metacarpophalangeal joints are normal. Palmar muscles are low-normal. Palmar moisture is normal. There is no palmar erythema. Palms are warm. Nails are normal.  Legs: Muscles appear normal for age. No edema is present.  Neurologic: Strength is normal for age in both the upper and lower extremities. Muscle tone is normal. Sensation to touch is normal in the legs.   LAB DATA:   Labs 10/16/21: : TSH 4.94 (ref 0.34-4.50); CMP normal; CBC normal; Vitamin B12 250 (ref 180-914); U/A normal  Labs 06/19/21: HbA1c 6.3%; TSH 1.77, free T4 1.4, free T3 3.1; PTH 36 (ref 16-77), calcium 10.0 (ref 8.6-10.4), 25-OH vitamin D 45  Labs 12/19/20: HbA1c 6.0%, CBG 122  Labs 12/17/20: TSH 1.40, free T4 1.2, free T3 2.7; PTH 44, calcium 9.7, 25-OH vitamin D 54  Labs 08/15/20: HbA1c 5.8%, CBG 118  Labs 08/10/20: TSH 2.69, free T4 1.3, free T3 2.8; PTH 35 (ref 16-77), calcium 9.7, 25-OH vitamin D 61;  Labs 04/17/20: HbA1c 6.3%, CBG 128  Labs 01/10/20: HbA1c 6.3%, CBG 129; TSH 0.80, free T4 1.6, free T3 3.1; PTH 31, calcium 9.8, 25-OH vitamin D 64  Labs 10/10/19; HbA1c 6.5%, CBG 132  Labs 10/05/19: TSH 1.26, free 4 1.4, free T3 3.3; PTH 20, calcium 10.0, 25-OH vitamin D 46  Labs 04/11/19: HbA1c 6.3%, CBG 139  Labs 04/01/19: TSH 1.77, free T4  1.3, free T3 3.1  Labs 01/11/19:; HbA1c 6.2%; TSH 2.72, free T4 1.2, free T3 3.0; PTH 24, calcium 10.0, 25-OH vitamin D 58  Labs 10/05/18: HbA1c 6.3%; TSH 0.34, free T4 1.2, free T3 3.0; PTH 18, calcium 9.9, 25-OH vitamin D 43  Labs 04/09/18: HbA1c 6.4%, CBG 115  Labs 04/08/18: TSH 1.02, free T4 1.4, free T3 2.4; PTH 16, calcium 9.5, 25-OH vitamin D 45  Labs 10/07/17: HbA1c 6.2%, CBG 135  Labs 10/02/17: TSH 0.66, free T4 1.3, free T3.9; PTH 36, calcium 9.6, 25-OH vitamin D 53 2  Labs 04/20/17: HbA1c 6.0%, CBG 112  Labs 04/16/17: TSH 1.12, free 4 1.5, free T3 3.2; 25-OH vitamin D 55  Labs 10/13/16: HbA1c 6.1%, CBG 122  Labs 10/07/16: TSH 2.63, free T4 1.3,  free T3 2.9; 25-OH vitamin D 47  Labs 06/05/16: HbA1c 5.8%, fasting glucose 120; TSH  1.40, free T4 1.1, free T3 2.7  Labs 02/22/16: HbA1c 6.4%, CBG 104, C-peptide 1.57 (ref 080-3.85); PTH 28, calcium 10, 25-OH vitamin D 45; TSH 0.16, free T4 1.3, free T3 2.8; CMP normal  Labs 11/11/15: TSH 0.05, free T4 1.5, free T3 3.6  Labs 08/14/15: HbA1c 5.8%  Labs 04/27/15: HbA1c 6.3%; TSH 1.04, free T4 1.3, free T3 2.8; PTH 45, calcium 9.4, 25-OH vitamin D 19  Labs 04/24/15: C-peptide 1.43 (normal 0.80-3.90)  Labs 12/04/14: HbA1c 6.1%  Labs 09/26/14 at 12:01 PM: CMP  normal; HbA1c 6.2%;CBC normal, iron 81; ACTH 21, cortisol 12.9; vitamin B12 705 (normal 211-911)  09/19/14: TSH 1.640, free T4 1.03, free T3 2.6; Calcium 9.3, PTH 33 (increased from 26 in March), 25-OH vitamin D 21 (decreased from 23 in march)  04/28/14: TSH 0.081, free T4 1.47, free T3 3.3; calcium 9.4, PTH 26, 25-OH vitamin D 23  10/27/13: TSH 1.087, free T4 1.04, free T3 2.7; Calcium 9.6, PTH 32, 25-hydroxyvitamin D 34  05/18/13: TSH 0.052, free T4 1.24, free T3 3.2  04/28/13: TSH 0.426, free T4 1.24, free T3 3.2; calcium 9.7, 25-hydroxy vitamin D 36  10/08/12: TSH 2.106, free T4 1.05, free T3 2.6; 25-OH vitamin D 37, PTH 39.4, calcium 9.1  04/02/12: TSH 1.073, free T4 1.31, free T3 2.9, calcium 9.5, 25-hydroxy vitamin D 31 - PTH was ordered but not done. I have re-ordered it this morning.  09/30/11: TSH 8.095, free T4 1.16, free T3 2.4. PTH 24, calcium 9.3, 25-hydroxy vitamin D 33, 1,25-dihydroxy vitamin D 95 11/21/10: Calcium was 9.5. Her PTH was 37.6. 25-hydroxy vitamin D was 41. 1, 25-dihydroxy vitamin D was 85, which was slightly elevated. B12 was 352 (211-911)    IMAGING:   12/07/14 US thyroid: Both lobes are smaller, with maximum dimensions <2.5 cm. Both lobes are heterogenous in echotexture. The previously seen exophytic, solid, hypoechoic, ill-defined nodule in the left inferior pole remains at 0.9 x 0.4 x 0.5 cm and is unchanged  from 08/23/2009.   10/12/12: US thyroid gland: Small 10 x 4 x 5 mm nodule or adjacent lymph node or parathyroid adenoma. Diffuse echogenicity. As I read the [place], there is definitely a lot of echogenicity c/w Hashimoto's thyroiditis. There may be a small nodule that is actually a bit less than one cm in longest dimension. This is unchanged since June 2011.   Assessment and Plan:   ASSESSMENT:  1. Hypothyroid:   A. Shelby Lin has acquired hypothyroidism due to Hashimoto's thyroiditis. During the past 16 years her TFTs have been variable, requiring several changes in her Synthroid dosage. Some of the variability was due to flare ups of Hashimoto's disease causing Hashitoxicosis. We have tried to maintain her TSH in the ideal physiologic goal range of 1.0-2.0.  B. Her TFTs in February 2019 were in the middle of the real physiologic normal range on her current Synthroid dose. Her TFTs in August 2019 were more variable. Her TSH was lower, her free T4 was lower, and her free T3 was higher. This pattern is most c/w having had a recent  flare up of thyroiditis. In contrast, her TFTs in February 2020 were mid-normal.   C. At her August 2020 visit her TSH was low, her free T4 was lower, and her free T3 was higher. It was likely that she had been having another flare up of thyroiditis. It was possible, however, that her tremor was due to relatively high thyroid hormone levels. It was prudent to reduce her Synthroid dose a bit and repeat her lab tests in two months.   D. Her TFTs in August 2021 and October 2021 were in goal range, but she was still mildly tremulous. Her TFTs in November 2021 were high-normal, so I reduced her levothyroxine dose slightly. Her TFTs in June 2022 were normal at about the 20% of the physiologic range. Her TFTS in October 2022 were at about the 55% of the physiologic range. Her tremor is not caused by hyperthyroidism. Her TFTs in April 2023 were med-euthyroid.  E. She is  clinically mildly  hypothyroid today in September 2023. Her recent TSH was elevated. She needs more thyroid hormone. 2. Thyroiditis: The patient's Hashimoto's disease has usually been clinically quiescent, but was mildly Hashitoxic in the January-March 2016 time period. Her thyroid US in October 2016 showed the heterogeneity c/w Hashimoto's thyroiditis. As noted above, she appeared to have had a recent flare up of thyroiditis prior to her August 2019 visit and again prior to her August 2020 visit. Her thyroiditis is clinically quiescent today.  I suspect that her Hashimoto's thyroiditis is still destroying thyrocytes. If so, she may need further increases in Synthroid doses over time;  3. Multinodular goiter:   A. The thyroid gland was again within normal limits for size at her February 2020 visit.   B. Her thyroid gland was borderline enlarged at her August 2020 visit, with the left lobe being a bit larger than the right. I dd not palpate any nodular area.   C. Her thyroid gland was normal in size in October 2021, in February 2022, in June 2022, in October 2022, in April 2023, and again in September 2023.   D. The waxing and waning of thyroid gland size is c/w evolving Hashimoto's disease.   E. Although some [place] studies have shown a nodule, her [place] study in June 2011 showed either a questionable nodule or demonstrated the echotexture heterogeneity that is commonly seen with Hashimoto's disease. Her [place] studies in August 2014 and October 2016 were essentially unchanged. She did not need a FNA at those times. 4. Headaches: Headaches appear to be "tension" headaches. They have significantly decreased in frequency and severity. She does better when she performs her cervical stretching exercises daily.   5. Hot flashes and sweats: These problems are chronic, but not debilitating.    6-7. Leg pains/vitamin D deficiency disease:   A. Her labs in July 2016 showed that her calcium was normal at 9.3, just below the 50%. Her PTH value  was mid-range normal. Her vitamin D was lower. She needed more calcium and vitamin D. I had previously asked her to take Citracal-D at doses of 1200-1500 mg of calcium per day and [phone] IU of vitamin D per day.   B. Her labs in March 2017 showed that her PTH value of 45 was higher, but still mid-normal. Calcium was a bit higher at 9.4. Vitamin D was lower at 19. At that point she agreed to purchase and take the Biotech form of vitamin D, one 50,000 IU capsule each week.   C. Her PTH, calcium and vitamin D were normal in December 2017, although the vitamin D had decreased.   D. She is now taking Biotech vitamin D, 50,000 units each week.  Her vitamin D levels were normal again in August 2018, in August 2019, in February 2020, in August 2020, in November 2020, in August 2021, and again in October 2021. Her PTH was lower in February 2020 and August 2020, slightly higher in November 2020, lower in August 2021 and October 2021, but appropriate to her calcium level. Her PTH, calcium, and vitamin D levels were good in June and October 2022 and in April 2023 were good.Her calcium level in August 2023 was still good.  8. Meniere's disease: Her vertigo has been fairly quiescent, but she occasionally notes symptoms. . Her Meniere's disease has not bothered her very often in the past few months. Her hearing in the left ear varies.  9. Hoarseness: In the past, the fact that her hoarseness  and post nasal drip came and went essentially in parallel indicate that the hoarseness was likely due to flare ups of her allergies. In addition, several flare ups of Hashimoto's disease have also caused hoarseness in the past. Her hoarseness has also been associated with GERD. Her hoarseness has been bothering her more recently. I again suggested that she may need a referral to ENT.  10. Dyspepsia: This problem is fairly well controlled with Prilosec.    11. Hypertension: Her BP is normal today on her losartan.     13. Depression:  Her affect and insight are very good today.   14. Fibromyalgia syndrome: This condition remains a problem for her, but seems to have worsened.  Exercise will help her "re-charge her mitochondrial batteries".  15. Fatigue:  This problem has also worsened, generally paralleling her COPD. 16. Prediabetes:  A. Her HbA1c values in July and in October 2016 were elevated into the "pre-diabetes" range. Her HbA1c values have fluctuated over time depending upon her diet and her activity levels.  Her HbA1c today was lower today in November 2021 than it was in August, but still in the prediabetes range.  I asked her to tighten down on her carb intake. I instructed her on the Eat Right Diet and the [place] recipes.  B. Her HbA1c in June 2020 was still in the prediabetes range, but lower. Her HbA1c in October 2022 was a bit higher, c/w her not exercising much due to leg pain.  Her HbA1c in April 2023 was 6.3%.  17. Arthritis: Her arthritis seems to be doing fairly well.   18. Overweight: Shelby Lin's weight has decreased two pounds. She is trying to eat healthier.   19. Tremor: Her tremor has varied in the past and is about the same today. If the tremor worsens she may benefit from a referral to neurology. She has an uncle who had Parkinson's disease.   PLAN:  1. Diagnostic: Repeat labs in 3 months.  2. Therapeutic: Try to do daily physical activity. Eat Right Diet and [facility] Diet recipes. Watch what she eats and drinks. Change Synthroid dose of  88 mcg/day to 7 days each week Take MVI daily and [employer], 50,000 IU per week. Take one Tums at bedtime. Continue Prilosec.  3. Patient education: We again discussed the issues of prediabetes, headaches, Hashimoto's disease, hypothyroidism, fibromyalgia, fatigue, and COPD. We discussed what she needs to do to control her BGs, to include getting more exercise and following our Eat Right Diet. She knows what she needs to do to get her weight back under control.  She is trying hard.  Unfortunately, her COPD, her injury, and the cold weather make that increasingly more difficult.    4. Follow-up: 3 months with [name]. If [name] feels comfortable with managing Shelby Lin's hypothyroidism due to Hashimoto's disease, that will be fine. We would like to keep Shelby Lin's TSH in the goal range of 1.0-2.5  Level of Service: This visit lasted in excess of 55 minutes. More than 50% of the visit was devoted to counseling.   Tillman Sers, MD, [facility]

## 2021-10-31 NOTE — Patient Instructions (Signed)
No further follow up here.  At [facility], we are committed to providing exceptional care. You will receive a patient satisfaction survey through text or email regarding your visit today. Your opinion is important to me. Comments are appreciated.  

## 2021-11-03 DIAGNOSIS — J449 Chronic obstructive pulmonary disease, unspecified: Secondary | ICD-10-CM | POA: Diagnosis not present

## 2021-11-08 ENCOUNTER — Ambulatory Visit: Payer: Medicare Other

## 2021-11-27 ENCOUNTER — Ambulatory Visit: Payer: Medicare Other

## 2021-12-03 DIAGNOSIS — J449 Chronic obstructive pulmonary disease, unspecified: Secondary | ICD-10-CM | POA: Diagnosis not present

## 2021-12-04 DIAGNOSIS — Z23 Encounter for immunization: Secondary | ICD-10-CM | POA: Diagnosis not present

## 2021-12-15 ENCOUNTER — Emergency Department (HOSPITAL_BASED_OUTPATIENT_CLINIC_OR_DEPARTMENT_OTHER): Payer: Medicare Other

## 2021-12-15 ENCOUNTER — Encounter (HOSPITAL_BASED_OUTPATIENT_CLINIC_OR_DEPARTMENT_OTHER): Payer: Self-pay | Admitting: *Deleted

## 2021-12-15 ENCOUNTER — Emergency Department (HOSPITAL_BASED_OUTPATIENT_CLINIC_OR_DEPARTMENT_OTHER)
Admission: EM | Admit: 2021-12-15 | Discharge: 2021-12-15 | Disposition: A | Discharge status: Home / Self Care | Payer: Medicare Other | Attending: Emergency Medicine | Admitting: Emergency Medicine

## 2021-12-15 DIAGNOSIS — Y9301 Activity, walking, marching and hiking: Secondary | ICD-10-CM | POA: Insufficient documentation

## 2021-12-15 DIAGNOSIS — S61512A Laceration without foreign body of left wrist, initial encounter: Secondary | ICD-10-CM | POA: Diagnosis not present

## 2021-12-15 DIAGNOSIS — S8992XA Unspecified injury of left lower leg, initial encounter: Secondary | ICD-10-CM | POA: Diagnosis not present

## 2021-12-15 DIAGNOSIS — Z743 Need for continuous supervision: Secondary | ICD-10-CM | POA: Diagnosis not present

## 2021-12-15 DIAGNOSIS — S00212A Abrasion of left eyelid and periocular area, initial encounter: Secondary | ICD-10-CM | POA: Diagnosis not present

## 2021-12-15 DIAGNOSIS — M25562 Pain in left knee: Secondary | ICD-10-CM | POA: Insufficient documentation

## 2021-12-15 DIAGNOSIS — S6992XA Unspecified injury of left wrist, hand and finger(s), initial encounter: Secondary | ICD-10-CM | POA: Diagnosis not present

## 2021-12-15 DIAGNOSIS — W01198A Fall on same level from slipping, tripping and stumbling with subsequent striking against other object, initial encounter: Secondary | ICD-10-CM | POA: Diagnosis not present

## 2021-12-15 DIAGNOSIS — Z7982 Long term (current) use of aspirin: Secondary | ICD-10-CM | POA: Diagnosis not present

## 2021-12-15 DIAGNOSIS — S0990XA Unspecified injury of head, initial encounter: Secondary | ICD-10-CM | POA: Diagnosis not present

## 2021-12-15 DIAGNOSIS — S199XXA Unspecified injury of neck, initial encounter: Secondary | ICD-10-CM | POA: Diagnosis not present

## 2021-12-15 DIAGNOSIS — M1812 Unilateral primary osteoarthritis of first carpometacarpal joint, left hand: Secondary | ICD-10-CM | POA: Diagnosis not present

## 2021-12-15 DIAGNOSIS — M50323 Other cervical disc degeneration at C6-C7 level: Secondary | ICD-10-CM | POA: Diagnosis not present

## 2021-12-15 DIAGNOSIS — G9389 Other specified disorders of brain: Secondary | ICD-10-CM | POA: Diagnosis not present

## 2021-12-15 DIAGNOSIS — I2510 Atherosclerotic heart disease of native coronary artery without angina pectoris: Secondary | ICD-10-CM | POA: Diagnosis not present

## 2021-12-15 DIAGNOSIS — W19XXXA Unspecified fall, initial encounter: Secondary | ICD-10-CM | POA: Diagnosis not present

## 2021-12-15 DIAGNOSIS — M189 Osteoarthritis of first carpometacarpal joint, unspecified: Secondary | ICD-10-CM | POA: Diagnosis not present

## 2021-12-15 DIAGNOSIS — M50322 Other cervical disc degeneration at C5-C6 level: Secondary | ICD-10-CM | POA: Diagnosis not present

## 2021-12-15 DIAGNOSIS — S0083XA Contusion of other part of head, initial encounter: Secondary | ICD-10-CM | POA: Insufficient documentation

## 2021-12-15 DIAGNOSIS — G319 Degenerative disease of nervous system, unspecified: Secondary | ICD-10-CM | POA: Diagnosis not present

## 2021-12-15 MED ORDER — ACETAMINOPHEN 325 MG PO TABS
650.0000 mg | ORAL_TABLET | Freq: Once | ORAL | Status: AC
  Administered 2021-12-15: 650 mg via ORAL
  Filled 2021-12-15: qty 2

## 2021-12-15 NOTE — ED Notes (Signed)
Comfort measures provided, warm blanket provided. HIGH FALL RISK interventions in place as well. Client instructed not to get up without staff in room.

## 2021-12-15 NOTE — ED Notes (Signed)
Client able to remove rings from left hand from middle and ring fingers. Placed in spec cup and given to husband

## 2021-12-15 NOTE — ED Provider Notes (Addendum)
[facility] DEPARTMENT Provider Note   CSN: [id number] Arrival date & time: 12/15/21  1248     History  Chief Complaint  Patient presents with   Shelby Lin is a 81 y.o. female.  Patient here after mechanical fall.  She was walking into a restaurant when she tripped and fell.  Hit the left side of her head.  Has abrasions to her left wrist, pain to the left knee per EMS.  She was able to ambulate to the stretcher.  She is not on blood thinners.  No major medical comorbidities.  Nothing makes it worse or better.  Denies any neck pain, other extremity pain.  No chest pain or shortness of breath or abdominal pain or nausea or vomiting.  No loss of consciousness.  The history is provided by the patient.       Home Medications Prior to Admission medications   Medication Sig Start Date End Date Taking? Authorizing Provider  albuterol (VENTOLIN HFA) 108 (90 Base) MCG/ACT inhaler Inhale 1-2 puffs into the lungs every 6 (six) hours as needed for wheezing or shortness of breath. 04/27/20   Brand Males, MD  aspirin EC 81 MG tablet Take 1 tablet (81 mg total) by mouth daily. 06/28/18   Jerline Pain, MD  benzonatate (TESSALON) 100 MG capsule Take 100 mg by mouth 3 (three) times daily as needed. 09/03/20   [provider]  Cyanocobalamin (VITAMIN B 12 PO) Take by mouth daily.    [provider]  DULoxetine (CYMBALTA) 30 MG capsule Take 30 mg by mouth 2 (two) times daily. 03/20/18   [provider]  fluticasone (FLONASE) 50 MCG/ACT nasal spray Place 1 spray into both nostrils daily. 02/26/21   Cobb, Karie Schwalbe, NP  furosemide (LASIX) 20 MG tablet Take 20 mg by mouth as needed.    [provider]  hydrochlorothiazide (HYDRODIURIL) 25 MG tablet TAKE 1 TABLET BY MOUTH EVERY DAY IN THE MORNING 12/30/17   [provider]  linaclotide (LINZESS) 72 MCG capsule 1 capsule at least 30 minutes before the first meal of the day on an  empty stomach 10/07/21   [provider]  LORazepam (ATIVAN) 0.5 MG tablet TAKE 1/2 TAB EVERY 8 12 HOURS AS NEEDED FOR ANXIOUSNESS 08/12/18   [provider]  losartan (COZAAR) 100 MG tablet Take 100 mg by mouth daily. 12/30/17   [provider]  meclizine (ANTIVERT) 25 MG tablet 1/2 to 1 tablet by mouth every 8 hrs as needed for diziness    [provider]  Multiple Vitamin (MULTIVITAMIN) tablet Take 1 tablet by mouth daily.    [provider]  Multiple Vitamins-Minerals (PRESERVISION AREDS) TABS See admin instructions.    [provider]  omeprazole (PRILOSEC) 20 MG capsule Take 20 mg by mouth daily.    [provider]  RABEprazole (ACIPHEX) 20 MG tablet Take one tablet twice daily. 08/15/20 08/15/21  Sherrlyn Hock, MD  Respiratory Therapy Supplies (FLUTTER) DEVI Use device 2-3 times a day to break up congestion 04/22/18   Lauraine Rinne, NP  rosuvastatin (CRESTOR) 20 MG tablet Take 1 tablet (20 mg total) by mouth daily. 06/28/18   Jerline Pain, MD  SYNTHROID 88 MCG tablet TAKE DAILY BEFORE BREAKFAST. 1 TABLET 6 DAYS EACH WEEK, BUT TAKE ONLY 1/2 TABLET ON THE 7TH DAY 05/02/21   Sherrlyn Hock, MD  TRELEGY ELLIPTA 100-62.5-25 MCG/ACT AEPB INHALE 1 PUFF BY MOUTH EVERY  DAY 05/03/21   Brand Males, MD  VITAMIN D, CHOLECALCIFEROL, PO Take 50,000 Units by mouth once a week.     [provider]      Allergies    Acyclovir and related, Allegra [fexofenadine], Amoxicillin, Codeine, Elemental sulfur, Enalapril maleate, Hydrocodone bit-homatrop mbr, Keflex [cephalexin], Levofloxacin, Lorabid [loracarbef], Misc. sulfonamide containing compounds, Other, and Sulfa antibiotics    Review of Systems   Review of Systems  Physical Exam Updated Vital Signs BP (!) 141/64   Pulse 74   Temp 98 F (36.7 C)   Resp 17   SpO2 99%  Physical Exam Vitals and nursing note reviewed.  Constitutional:      General: She is not in acute  distress.    Appearance: She is well-developed. She is not ill-appearing.  HENT:     Head: Normocephalic.     Comments: There is a small abrasion by the left eyebrow with a little bit of contusion    Nose: Nose normal.     Mouth/Throat:     Mouth: Mucous membranes are moist.  Eyes:     Extraocular Movements: Extraocular movements intact.     Conjunctiva/sclera: Conjunctivae normal.     Pupils: Pupils are equal, round, and reactive to light.  Cardiovascular:     Rate and Rhythm: Normal rate and regular rhythm.     Pulses: Normal pulses.     Heart sounds: Normal heart sounds. No murmur heard. Pulmonary:     Effort: Pulmonary effort is normal. No respiratory distress.     Breath sounds: Normal breath sounds.  Abdominal:     Palpations: Abdomen is soft.     Tenderness: There is no abdominal tenderness.  Musculoskeletal:        General: Swelling and tenderness present.     Cervical back: Normal range of motion and neck supple.     Comments: Tenderness to the left wrist, left knee, no midline spinal tenderness  Skin:    General: Skin is warm and dry.     Capillary Refill: Capillary refill takes less than 2 seconds.     Comments: Skin tear to left wrist  Neurological:     General: No focal deficit present.     Mental Status: She is alert and oriented to person, place, and time.     Cranial Nerves: No cranial nerve deficit.     Sensory: No sensory deficit.     Motor: No weakness.     Coordination: Coordination normal.     Comments: 5+ out of 5 strength throughout, normal sensation, no drift, normal finger-nose-finger  Psychiatric:        Mood and Affect: Mood normal.     ED Results / Procedures / Treatments   Labs (all labs ordered are listed, but only abnormal results are displayed) Labs Reviewed - No data to display  EKG None  Radiology DG Knee Complete 4 Views Left  Result Date: 12/15/2021 CLINICAL DATA:  Trauma, fall EXAM: LEFT KNEE - COMPLETE 4+ VIEW COMPARISON:   None Available. FINDINGS: No evidence of fracture, dislocation, or joint effusion. No evidence of arthropathy or other focal bone abnormality. Soft tissues are unremarkable. IMPRESSION: No fracture or dislocation is seen in left knee. Electronically Signed   By: Elmer Picker M.D.   On: 12/15/2021 13:45   DG Hand Complete Left  Result Date: 12/15/2021 CLINICAL DATA:  Trauma, fall EXAM: LEFT HAND - COMPLETE 3+ VIEW COMPARISON:  None Available. FINDINGS: No recent fracture or dislocation is seen.  Degenerative changes are noted in first carpometacarpal joint, first and second metacarpophalangeal joints and distal interphalangeal joints. IMPRESSION: No recent fracture or dislocation is seen in left hand. Degenerative changes are noted in multiple joints as described in the body of the report. Electronically Signed   By: Elmer Picker M.D.   On: 12/15/2021 13:45   DG Wrist Complete Left  Result Date: 12/15/2021 CLINICAL DATA:  Trauma, fall EXAM: LEFT WRIST - COMPLETE 3+ VIEW COMPARISON:  None Available. FINDINGS: No recent fracture or dislocation is seen. Degenerative changes are noted in first carpometacarpal joint with joint space narrowing and bony spurs. There is mild deformity of trapezium and trapezoid, possibly residual from previous injury. There is widening of joint space between scaphoid and distal row of tarsals, possibly related to previous injury. IMPRESSION: No recent fracture or dislocation is seen in left wrist. Other findings as described in the body of the report. Electronically Signed   By: Elmer Picker M.D.   On: 12/15/2021 13:44   CT Head Wo Contrast  Result Date: 12/15/2021 CLINICAL DATA:  Head trauma, moderate-severe; Neck trauma (Age >= 65y) EXAM: CT HEAD WITHOUT CONTRAST CT CERVICAL SPINE WITHOUT CONTRAST TECHNIQUE: Multidetector CT imaging of the head and cervical spine was performed following the standard protocol without intravenous contrast. Multiplanar CT  image reconstructions of the cervical spine were also generated. RADIATION DOSE REDUCTION: This exam was performed according to the departmental dose-optimization program which includes automated exposure control, adjustment of the mA and/or kV according to patient size and/or use of iterative reconstruction technique. COMPARISON:  10/05/2015 FINDINGS: CT HEAD FINDINGS Brain: No evidence of acute infarction, hemorrhage, hydrocephalus, extra-axial collection or mass lesion/mass effect. Unchanged focal calcification along the right tentorium. Scattered low-density changes within the periventricular and subcortical white matter compatible with chronic microvascular ischemic change. Mild diffuse cerebral volume loss. Vascular: Atherosclerotic calcifications involving the large vessels of the skull base. No unexpected hyperdense vessel. Skull: Normal. Negative for fracture or focal lesion. Sinuses/Orbits: No acute finding. Other: None. CT CERVICAL SPINE FINDINGS Alignment: Facet joints are aligned without dislocation or traumatic listhesis. Dens and lateral masses are aligned. Straightening of the cervical lordosis. Skull base and vertebrae: No acute fracture. No primary bone lesion or focal pathologic process. Congenital nonunion of the posterior arch of C1. Soft tissues and spinal canal: No prevertebral fluid or swelling. No visible canal hematoma. Disc levels: Degenerative disc disease most pronounced at the C5-6 and C6-7 levels. Moderate multilevel bilateral facet arthropathy. Upper chest: Negative. Other: None. IMPRESSION: 1. No acute intracranial abnormality. 2. No acute cervical spine fracture or subluxation. Electronically Signed   By: Davina Poke D.O.   On: 12/15/2021 13:32   CT Cervical Spine Wo Contrast  Result Date: 12/15/2021 CLINICAL DATA:  Head trauma, moderate-severe; Neck trauma (Age >= 65y) EXAM: CT HEAD WITHOUT CONTRAST CT CERVICAL SPINE WITHOUT CONTRAST TECHNIQUE: Multidetector CT imaging of  the head and cervical spine was performed following the standard protocol without intravenous contrast. Multiplanar CT image reconstructions of the cervical spine were also generated. RADIATION DOSE REDUCTION: This exam was performed according to the departmental dose-optimization program which includes automated exposure control, adjustment of the mA and/or kV according to patient size and/or use of iterative reconstruction technique. COMPARISON:  10/05/2015 FINDINGS: CT HEAD FINDINGS Brain: No evidence of acute infarction, hemorrhage, hydrocephalus, extra-axial collection or mass lesion/mass effect. Unchanged focal calcification along the right tentorium. Scattered low-density changes within the periventricular and subcortical white matter compatible with chronic microvascular ischemic change.  Mild diffuse cerebral volume loss. Vascular: Atherosclerotic calcifications involving the large vessels of the skull base. No unexpected hyperdense vessel. Skull: Normal. Negative for fracture or focal lesion. Sinuses/Orbits: No acute finding. Other: None. CT CERVICAL SPINE FINDINGS Alignment: Facet joints are aligned without dislocation or traumatic listhesis. Dens and lateral masses are aligned. Straightening of the cervical lordosis. Skull base and vertebrae: No acute fracture. No primary bone lesion or focal pathologic process. Congenital nonunion of the posterior arch of C1. Soft tissues and spinal canal: No prevertebral fluid or swelling. No visible canal hematoma. Disc levels: Degenerative disc disease most pronounced at the C5-6 and C6-7 levels. Moderate multilevel bilateral facet arthropathy. Upper chest: Negative. Other: None. IMPRESSION: 1. No acute intracranial abnormality. 2. No acute cervical spine fracture or subluxation. Electronically Signed   By: Davina Poke D.O.   On: 12/15/2021 13:32    Procedures Procedures    Medications Ordered in ED Medications  acetaminophen (TYLENOL) tablet 650 mg (650  mg Oral Given 12/15/21 1258)    ED Course/ Medical Decision Making/ A&P                           Medical Decision Making Amount and/or Complexity of Data Reviewed Radiology: ordered.  Risk OTC drugs.   DIANE HANEL is here after mechanical fall.  Normal vitals.  No fever.  Little bit of bruising to the left side of the forehead.  She tripped and fell and hit the left side of her head.  She has abrasions to her left wrist and hand.  She has left knee pain.  Minimal swelling and tenderness to the left knee.  He was able to bear weight after the fall.  She is not on blood thinners.  She did not lose consciousness.  We will get a CT scan of the head, neck, x-ray of the left knee, left wrist and hand to further evaluate for injuries.  Differential diagnosis is likely contusions but will rule out fracture, head bleed.  She is neurologically intact.  Well-appearing.  Will update tetanus shot.  X-rays of the wrist and hand and knee per my review and interpretation showed no fracture or dislocation.  Head CT and neck CT per my review and interpretation no acute findings.  Overall patient with contusions.  Recommend ice, Tylenol, rest.  Discharged in good condition.  This chart was dictated using voice recognition software.  Despite best efforts to proofread,  errors can occur which can change the documentation meaning.      Final Clinical Impression(s) / ED Diagnoses Final diagnoses:  Contusion of face, initial encounter    Rx / DC Orders ED Discharge Orders     None         Lennice Sites, DO 12/15/21 [place], Simuel Stebner, DO 12/15/21 1402

## 2021-12-15 NOTE — ED Notes (Signed)
C/o pain at left side of head, noted to have a small cut at left temp area, on no blood thinners, bleeding controlled, site cleaned, evaluated by ED MD, also c/o left forearm pain, skin tear noted on left forearm, area cleaned and dsg applied. Also c/o mostly of left knee pain, no injury noted, but slight swelling noted of left knee, has strong plantar and dorsal flexion of both feet

## 2021-12-15 NOTE — ED Triage Notes (Signed)
Arrived via [facility] secondary to a mechanical fall, client states she tripped while entering a food establishment. Denies any LOC, denies being on any blood thinners.

## 2021-12-15 NOTE — ED Triage Notes (Signed)
Client had a mechanical fall while entering a restaurant , states she tripped and fell more on her left side. C/o left knee pain, left forearm pain and left side of head. Noted to have some swelling on left side of head near left eye. ED MD immediately to bedside to evaluate client

## 2021-12-19 ENCOUNTER — Ambulatory Visit (INDEPENDENT_AMBULATORY_CARE_PROVIDER_SITE_OTHER): Payer: Medicare Other | Admitting: "Endocrinology

## 2021-12-24 ENCOUNTER — Ambulatory Visit: Payer: Medicare Other

## 2021-12-24 DIAGNOSIS — E039 Hypothyroidism, unspecified: Secondary | ICD-10-CM | POA: Diagnosis not present

## 2021-12-24 DIAGNOSIS — K219 Gastro-esophageal reflux disease without esophagitis: Secondary | ICD-10-CM | POA: Diagnosis not present

## 2021-12-24 DIAGNOSIS — I10 Essential (primary) hypertension: Secondary | ICD-10-CM | POA: Diagnosis not present

## 2021-12-24 DIAGNOSIS — E7800 Pure hypercholesterolemia, unspecified: Secondary | ICD-10-CM | POA: Diagnosis not present

## 2021-12-24 DIAGNOSIS — J449 Chronic obstructive pulmonary disease, unspecified: Secondary | ICD-10-CM | POA: Diagnosis not present

## 2021-12-24 DIAGNOSIS — E1169 Type 2 diabetes mellitus with other specified complication: Secondary | ICD-10-CM | POA: Diagnosis not present

## 2021-12-26 DIAGNOSIS — E039 Hypothyroidism, unspecified: Secondary | ICD-10-CM | POA: Diagnosis not present

## 2021-12-26 DIAGNOSIS — R2689 Other abnormalities of gait and mobility: Secondary | ICD-10-CM | POA: Diagnosis not present

## 2021-12-26 DIAGNOSIS — Z9181 History of falling: Secondary | ICD-10-CM | POA: Diagnosis not present

## 2021-12-26 DIAGNOSIS — R2681 Unsteadiness on feet: Secondary | ICD-10-CM | POA: Diagnosis not present

## 2021-12-30 ENCOUNTER — Ambulatory Visit: Payer: Medicare Other | Admitting: Nurse Practitioner

## 2021-12-30 ENCOUNTER — Other Ambulatory Visit: Payer: Self-pay

## 2021-12-30 ENCOUNTER — Ambulatory Visit: Payer: Medicare Other

## 2021-12-30 ENCOUNTER — Encounter: Payer: Self-pay | Admitting: Nurse Practitioner

## 2021-12-30 ENCOUNTER — Ambulatory Visit (INDEPENDENT_AMBULATORY_CARE_PROVIDER_SITE_OTHER): Payer: Medicare Other

## 2021-12-30 VITALS — BP 124/72 | HR 87 | Ht 64.0 in | Wt 159.6 lb

## 2021-12-30 DIAGNOSIS — J441 Chronic obstructive pulmonary disease with (acute) exacerbation: Secondary | ICD-10-CM

## 2021-12-30 DIAGNOSIS — J471 Bronchiectasis with (acute) exacerbation: Secondary | ICD-10-CM | POA: Diagnosis not present

## 2021-12-30 MED ORDER — DOXYCYCLINE HYCLATE 100 MG PO TABS: 100.0000 mg | ORAL_TABLET | Freq: Two times a day (BID) | ORAL | 0 refills | Status: DC

## 2021-12-30 MED ORDER — PREDNISONE 20 MG PO TABS: 20.0000 mg | ORAL_TABLET | Freq: Every day | ORAL | 0 refills | Status: AC

## 2021-12-30 NOTE — Patient Instructions (Addendum)
Continue Albuterol inhaler 2 puffs every 6 hours as needed for shortness of breath or wheezing. Notify if symptoms persist despite rescue inhaler/neb use.  Restart flonase nasal spray 2 sprays each nostril daily Continue Trelegy 1 puff daily. Brush tongue and rinse mouth afterwards  Prednisone 20 mg daily for 5 days. Take in AM with food Doxycyline 1 tab Twice daily for 7 days. Wear sunscreen while taking - medication can increase risk for sunburns Guaifenesin [phone] mg over the counter Twice daily for congestion/cough Flutter valve 2-3 times a day  COVID swab when you get home - call if positive   Follow up in 2 weeks with [name] or Alanson Aly. If symptoms do not improve or worsen, please contact office for sooner follow up or seek emergency care.

## 2021-12-30 NOTE — Assessment & Plan Note (Signed)
See above

## 2021-12-30 NOTE — Assessment & Plan Note (Signed)
AECOPD/bronchiectatic flare. Possibly related to viral illness due to recent sick exposures. Unable to perform COVID testing in office; advised her to swab at home and notify if positive so we can initiate antiviral therapy. We will treat her with prednisone burst and empiric doxycycline 7 day course. CXR to rule out superimposed infection. Encouraged mucociliary clearance therapies.   Patient Instructions  Continue Albuterol inhaler 2 puffs every 6 hours as needed for shortness of breath or wheezing. Notify if symptoms persist despite rescue inhaler/neb use.  Restart flonase nasal spray 2 sprays each nostril daily Continue Trelegy 1 puff daily. Brush tongue and rinse mouth afterwards  Prednisone 20 mg daily for 5 days. Take in AM with food Doxycyline 1 tab Twice daily for 7 days. Wear sunscreen while taking - medication can increase risk for sunburns Guaifenesin [phone] mg over the counter Twice daily for congestion/cough Flutter valve 2-3 times a day  COVID swab when you get home - call if positive   Follow up in 2 weeks with [name] or Alanson Aly. If symptoms do not improve or worsen, please contact office for sooner follow up or seek emergency care.

## 2021-12-30 NOTE — Progress Notes (Signed)
$'@Patient'X$  ID: Shelby Lin, female    DOB: 1941-02-12, 81 y.o.   MRN: [id number]  Chief Complaint  Patient presents with   Follow-up    Pt f/u she is having a cough (onset over weekend), wheezing, pt states that she is getting up yellow phlegm. Denies fevers.     Referring provider: Carol Ada, MD  HPI: 81 year old female, former smoker (21 pack years) followed for stage III severe COPD by Gold classification and bronchiectasis.  She is a patient of Shelby Lin and was last seen in office on 02/26/2021 by [facility] NP.  Past medical history significant for coronary artery spasms, hypertension, GERD, hypothyroidism, Mnire's disease, fibromyalgia, prediabetes.  TEST/EVENTS:  01/28/2018 CT chest without contrast: Atherosclerosis.  Mild to moderate centrilobular emphysema with diffuse bronchial wall thickening.  Right middle lobe solid 8 x 2 mm pulmonary nodule stable since 02/11/2017 CT chest, considered benign.  Separate 2 mm solid right middle lobe pulmonary nodule is decreased from 4 mm considered benign.  Minimal scattered cylindrical bronchiectasis in right middle lobe and medial bilateral upper lobe is unchanged.  Small hiatal hernia. 09/10/2020 CXR 2 view: Lungs clear.  Heart size normal.  Atherosclerosis.  No acute disease. 01/09/2019 PFTs: FVC 1.93 (73), FEV1 0.95 (48), ratio 49, DLCO uncorrected 10.82 (57)  01/08/2021: OV with Dr. Chase Lin.  No acute new concerns follow-up after PFTs which are stable in the last 3 years.  Continued Trelegy daily and albuterol as needed.  Continued oxygen as needed.  Follow-up in 9 months.  02/26/2021: OV with Shelby Hedger NP for worsening cough and nasal congestion.  Her symptoms started on 12/24 and have persisted since.  She notes that her cough is productive with clear sputum production. She has had nasal congestion with clear rhinorrhea and sinus pressure/pain. She has noticed a mild increase in her shortness of breath with exertion as well. She was  previously started on doxycyline for 7 days and prednisone for 5. She did notice a slight improvement of her symptoms while on the prednisone and still has 1 dose of doxy left. Extended doxycycline to complete 10 day course and prednisone taper.   12/30/2021: Today - acute Patient presents today for acute visit.  On Saturday, she woke up and noticed that she had increased cough.  Since then she started having some more chest congestion and wheezing.  Yesterday, she started producing phlegm.  Describes it as yellow in color.  She has had a few sick exposures at her church, with confirmed cases of COVID.  She did not do any viral testing at home.  No significant URI symptoms.  Denies any fevers, chills, hemoptysis, increased shortness of breath.  She uses Trelegy every day.  Has not had to use albuterol.  Not using anything over-the-counter for cough.  Does have a flutter valve at home but has not restarted this yet.  Allergies  Allergen Reactions   Acyclovir And Related    Allegra [Fexofenadine] Hives   Amoxicillin Hives   Codeine Nausea And Vomiting   Elemental Sulfur Hives   Enalapril Maleate Cough   Hydrocodone Bit-Homatrop Mbr Other (See Comments)   Keflex [Cephalexin] Hives   Levofloxacin Other (See Comments)    insomnia   Lorabid [Loracarbef] Hives   Misc. Sulfonamide Containing Compounds    Other Other (See Comments)   Sulfa Antibiotics Hives    Other reaction(s): hives    Immunization History  Administered Date(s) Administered   Influenza Split 11/30/2007, 01/17/2015, 01/08/2016, 11/27/2017, 12/14/2018  Influenza, High Dose Seasonal PF 12/01/2012, 12/05/2013, 12/12/2016, 11/27/2017, 12/14/2018, 01/01/2021   Influenza-Unspecified 12/18/2011   PFIZER(Purple Top)SARS-COV-2 Vaccination 05/05/2019, 05/26/2019, 12/12/2019   Pneumococcal Conjugate-13 07/18/2015, 10/16/2017   Pneumococcal Polysaccharide-23 10/23/2008, 10/20/2018   Td 07/13/2007   Tdap 07/27/2017   Zoster, Live  05/04/2014    Past Medical History:  Diagnosis Date   Anxiety    Arthritis    Asthma in child    Atherosclerotic heart disease of native coronary artery without angina pectoris    Cataracts, bilateral    Combined hyperlipidemia    Constipation, unspecified    COPD (chronic obstructive pulmonary disease) (HCC)    Coronary artery spasm (HCC)    Diabetic neuropathy (HCC)    DM (diabetes mellitus) with complications ([place])    Dupuytren's contracture    Dyspepsia    Familial tremor    Fatigue    Fibromyalgia syndrome    GERD (gastroesophageal reflux disease)    Hashimoto's thyroiditis    Hiatal hernia    History of colon polyps    Hx of Hashimoto thyroiditis    Hypercholesteremia    Hypertension    Hypothyroidism, acquired, autoimmune    IBS (irritable bowel syndrome)    Meniere's disease of left ear    Migraine without aura and without status migrainosus, not intractable    Multinodular goiter (nontoxic)    Nonexudative macular degeneration    Raynaud's syndrome without gangrene    Situational stress    Tachycardia    Thyroiditis, autoimmune    Ulcerative colitis, unspecified, without complications (HCC)    Vitamin B 12 deficiency    Vitamin D deficiency    Wears glasses     Tobacco History: Social History   Tobacco Use  Smoking Status Former   Packs/day: 0.50   Years: 42.00   Total pack years: 21.00   Types: Cigarettes   Quit date: 02/21/2001   Years since quitting: 20.8  Smokeless Tobacco Never   Counseling given: Not Answered   Outpatient Medications Prior to Visit  Medication Sig Dispense Refill   albuterol (VENTOLIN HFA) 108 (90 Base) MCG/ACT inhaler Inhale 1-2 puffs into the lungs every 6 (six) hours as needed for wheezing or shortness of breath. 18 g 6   aspirin EC 81 MG tablet Take 1 tablet (81 mg total) by mouth daily.     benzonatate (TESSALON) 100 MG capsule Take 100 mg by mouth 3 (three) times daily as needed.     Cyanocobalamin (VITAMIN B 12  PO) Take by mouth daily.     DULoxetine (CYMBALTA) 30 MG capsule Take 30 mg by mouth 2 (two) times daily.     fluticasone (FLONASE) 50 MCG/ACT nasal spray Place 1 spray into both nostrils daily. 16 g 2   furosemide (LASIX) 20 MG tablet Take 20 mg by mouth as needed.     hydrochlorothiazide (HYDRODIURIL) 25 MG tablet TAKE 1 TABLET BY MOUTH EVERY DAY IN THE MORNING  1   linaclotide (LINZESS) 72 MCG capsule 1 capsule at least 30 minutes before the first meal of the day on an empty stomach     LORazepam (ATIVAN) 0.5 MG tablet TAKE 1/2 TAB EVERY 8 12 HOURS AS NEEDED FOR ANXIOUSNESS     losartan (COZAAR) 100 MG tablet Take 100 mg by mouth daily.  1   meclizine (ANTIVERT) 25 MG tablet 1/2 to 1 tablet by mouth every 8 hrs as needed for diziness     Multiple Vitamin (MULTIVITAMIN) tablet Take 1 tablet by mouth  daily.     Multiple Vitamins-Minerals (PRESERVISION AREDS) TABS See admin instructions.     omeprazole (PRILOSEC) 20 MG capsule Take 20 mg by mouth daily.     Respiratory Therapy Supplies (FLUTTER) DEVI Use device 2-3 times a day to break up congestion 1 each 0   rosuvastatin (CRESTOR) 20 MG tablet Take 1 tablet (20 mg total) by mouth daily. 90 tablet 3   SYNTHROID 88 MCG tablet TAKE DAILY BEFORE BREAKFAST. 1 TABLET 6 DAYS EACH WEEK, BUT TAKE ONLY 1/2 TABLET ON THE 7TH DAY 30 tablet 5   TRELEGY ELLIPTA 100-62.5-25 MCG/ACT AEPB INHALE 1 PUFF BY MOUTH EVERY DAY 60 each 11   VITAMIN D, CHOLECALCIFEROL, PO Take 50,000 Units by mouth once a week.      RABEprazole (ACIPHEX) 20 MG tablet Take one tablet twice daily. 180 tablet 3   No facility-administered medications prior to visit.     Review of Systems:   Constitutional: No weight loss or gain, night sweats, fevers, chills, fatigue, or lassitude. HEENT: No headaches, difficulty swallowing, tooth/dental problems, or sore throat. No sneezing, itching, ear ache,  nasal congestion CV:  No chest pain, orthopnea, PND, swelling in lower extremities,  anasarca, dizziness, palpitations, syncope Resp: +shortness of breath with exertion (baseline); productive cough; wheezing; chest congestion. No hemoptysis.  No chest wall deformity GI:  No heartburn, indigestion, abdominal pain, nausea, vomiting, diarrhea, change in bowel habits, loss of appetite, bloody stools.  MSK:  No joint pain or swelling.  No decreased range of motion.  No back pain. Neuro: No dizziness or lightheadedness.  Psych: No depression or anxiety. Mood stable.     Physical Exam:  BP 124/72   Pulse 87   Ht '5\' 4"'$  (1.626 m)   Wt 159 lb 9.6 oz (72.4 kg)   SpO2 98%   BMI 27.40 kg/m   GEN: Pleasant, interactive, well-nourished, well-appearing; in no acute distress. HEENT:  Normocephalic and atraumatic. EACs patent bilaterally. TM pearly gray with present light reflex bilaterally. PERRLA. Sclera white. Nasal turbinates pink, moist and patent bilaterally. Clear rhinorrhea present. Oropharynx erythematous and moist, without exudate or edema. No lesions, ulcerations NECK:  Supple w/ fair ROM. No JVD present. Normal carotid impulses w/o bruits. Thyroid symmetrical with no goiter or nodules palpated. No lymphadenopathy.   CV: RRR, no m/r/g, no peripheral edema. Pulses intact, +2 bilaterally. No cyanosis, pallor or clubbing. PULMONARY:  Unlabored, regular breathing. Minimal rhonchi bilaterally A&P w/o wheezes. No accessory muscle use. No dullness to percussion. GI: BS present and normoactive. Soft, non-tender to palpation. No organomegaly or masses detected. MSK: No erythema, warmth or tenderness. Cap refil <2 sec all extrem. No deformities or joint swelling noted.  Neuro: A/Ox3. No focal deficits noted.   Skin: Warm, no lesions or rashe Psych: Normal affect and behavior. Judgement and thought content appropriate.     Lab Results:  CBC    Component Value Date/Time   WBC 14.0 (H) 02/28/2018 1732   RBC 3.93 02/28/2018 1732   HGB 11.6 (L) 02/28/2018 1732   HCT 37.0 02/28/2018  1732   PLT 382 02/28/2018 1732   MCV 94.1 02/28/2018 1732   MCH 29.5 02/28/2018 1732   MCHC 31.4 02/28/2018 1732   RDW 12.3 02/28/2018 1732   LYMPHSABS 2.9 02/28/2018 1732   MONOABS 1.2 (H) 02/28/2018 1732   EOSABS 0.3 02/28/2018 1732   BASOSABS 0.1 02/28/2018 1732    BMET    Component Value Date/Time   NA 136 02/28/2018 1732  K 3.5 02/28/2018 1732   CL 98 02/28/2018 1732   CO2 29 02/28/2018 1732   GLUCOSE 133 (H) 02/28/2018 1732   BUN 12 02/28/2018 1732   CREATININE 0.63 02/28/2018 1732   CREATININE 0.59 (L) 02/22/2016 0001   CALCIUM 10.0 06/19/2021 1020   CALCIUM 9.7 04/06/2012 1019   GFRNONAA >60 02/28/2018 1732   GFRAA >60 02/28/2018 1732    BNP    Component Value Date/Time   BNP 25.5 02/28/2018 1732     Imaging:  02/26/2021: CXR today reviewed by me. Borderline to mild hyperinflation. No acute process noted.   DG Knee Complete 4 Views Left  Result Date: 12/15/2021 CLINICAL DATA:  Trauma, fall EXAM: LEFT KNEE - COMPLETE 4+ VIEW COMPARISON:  None Available. FINDINGS: No evidence of fracture, dislocation, or joint effusion. No evidence of arthropathy or other focal bone abnormality. Soft tissues are unremarkable. IMPRESSION: No fracture or dislocation is seen in left knee. Electronically Signed   By: Elmer Picker M.D.   On: 12/15/2021 13:45   DG Hand Complete Left  Result Date: 12/15/2021 CLINICAL DATA:  Trauma, fall EXAM: LEFT HAND - COMPLETE 3+ VIEW COMPARISON:  None Available. FINDINGS: No recent fracture or dislocation is seen. Degenerative changes are noted in first carpometacarpal joint, first and second metacarpophalangeal joints and distal interphalangeal joints. IMPRESSION: No recent fracture or dislocation is seen in left hand. Degenerative changes are noted in multiple joints as described in the body of the report. Electronically Signed   By: Elmer Picker M.D.   On: 12/15/2021 13:45   DG Wrist Complete Left  Result Date:  12/15/2021 CLINICAL DATA:  Trauma, fall EXAM: LEFT WRIST - COMPLETE 3+ VIEW COMPARISON:  None Available. FINDINGS: No recent fracture or dislocation is seen. Degenerative changes are noted in first carpometacarpal joint with joint space narrowing and bony spurs. There is mild deformity of trapezium and trapezoid, possibly residual from previous injury. There is widening of joint space between scaphoid and distal row of tarsals, possibly related to previous injury. IMPRESSION: No recent fracture or dislocation is seen in left wrist. Other findings as described in the body of the report. Electronically Signed   By: Elmer Picker M.D.   On: 12/15/2021 13:44   CT Head Wo Contrast  Result Date: 12/15/2021 CLINICAL DATA:  Head trauma, moderate-severe; Neck trauma (Age >= 65y) EXAM: CT HEAD WITHOUT CONTRAST CT CERVICAL SPINE WITHOUT CONTRAST TECHNIQUE: Multidetector CT imaging of the head and cervical spine was performed following the standard protocol without intravenous contrast. Multiplanar CT image reconstructions of the cervical spine were also generated. RADIATION DOSE REDUCTION: This exam was performed according to the departmental dose-optimization program which includes automated exposure control, adjustment of the mA and/or kV according to patient size and/or use of iterative reconstruction technique. COMPARISON:  10/05/2015 FINDINGS: CT HEAD FINDINGS Brain: No evidence of acute infarction, hemorrhage, hydrocephalus, extra-axial collection or mass lesion/mass effect. Unchanged focal calcification along the right tentorium. Scattered low-density changes within the periventricular and subcortical white matter compatible with chronic microvascular ischemic change. Mild diffuse cerebral volume loss. Vascular: Atherosclerotic calcifications involving the large vessels of the skull base. No unexpected hyperdense vessel. Skull: Normal. Negative for fracture or focal lesion. Sinuses/Orbits: No acute finding.  Other: None. CT CERVICAL SPINE FINDINGS Alignment: Facet joints are aligned without dislocation or traumatic listhesis. Dens and lateral masses are aligned. Straightening of the cervical lordosis. Skull base and vertebrae: No acute fracture. No primary bone lesion or focal pathologic process. Congenital nonunion of  the posterior arch of C1. Soft tissues and spinal canal: No prevertebral fluid or swelling. No visible canal hematoma. Disc levels: Degenerative disc disease most pronounced at the C5-6 and C6-7 levels. Moderate multilevel bilateral facet arthropathy. Upper chest: Negative. Other: None. IMPRESSION: 1. No acute intracranial abnormality. 2. No acute cervical spine fracture or subluxation. Electronically Signed   By: Davina Poke D.O.   On: 12/15/2021 13:32   CT Cervical Spine Wo Contrast  Result Date: 12/15/2021 CLINICAL DATA:  Head trauma, moderate-severe; Neck trauma (Age >= 65y) EXAM: CT HEAD WITHOUT CONTRAST CT CERVICAL SPINE WITHOUT CONTRAST TECHNIQUE: Multidetector CT imaging of the head and cervical spine was performed following the standard protocol without intravenous contrast. Multiplanar CT image reconstructions of the cervical spine were also generated. RADIATION DOSE REDUCTION: This exam was performed according to the departmental dose-optimization program which includes automated exposure control, adjustment of the mA and/or kV according to patient size and/or use of iterative reconstruction technique. COMPARISON:  10/05/2015 FINDINGS: CT HEAD FINDINGS Brain: No evidence of acute infarction, hemorrhage, hydrocephalus, extra-axial collection or mass lesion/mass effect. Unchanged focal calcification along the right tentorium. Scattered low-density changes within the periventricular and subcortical white matter compatible with chronic microvascular ischemic change. Mild diffuse cerebral volume loss. Vascular: Atherosclerotic calcifications involving the large vessels of the skull base.  No unexpected hyperdense vessel. Skull: Normal. Negative for fracture or focal lesion. Sinuses/Orbits: No acute finding. Other: None. CT CERVICAL SPINE FINDINGS Alignment: Facet joints are aligned without dislocation or traumatic listhesis. Dens and lateral masses are aligned. Straightening of the cervical lordosis. Skull base and vertebrae: No acute fracture. No primary bone lesion or focal pathologic process. Congenital nonunion of the posterior arch of C1. Soft tissues and spinal canal: No prevertebral fluid or swelling. No visible canal hematoma. Disc levels: Degenerative disc disease most pronounced at the C5-6 and C6-7 levels. Moderate multilevel bilateral facet arthropathy. Upper chest: Negative. Other: None. IMPRESSION: 1. No acute intracranial abnormality. 2. No acute cervical spine fracture or subluxation. Electronically Signed   By: Davina Poke D.O.   On: 12/15/2021 13:32         Latest Ref Rng & Units 01/08/2021   10:46 AM 04/01/2017    9:41 AM  PFT Results  FVC-Pre L 1.93  1.69   FVC-Predicted Pre % 73  61   FVC-Post L  2.02   FVC-Predicted Post %  73   Pre FEV1/FVC % % 49  45   Post FEV1/FCV % %  48   FEV1-Pre L 0.95  0.76   FEV1-Predicted Pre % 48  36   FEV1-Post L  0.96   DLCO uncorrected ml/min/mmHg 10.82  12.81   DLCO UNC% % 57  52   DLCO corrected ml/min/mmHg 10.82    DLCO COR %Predicted % 57    DLVA Predicted % 88  84     No results found for: "NITRICOXIDE"      Assessment & Plan:   COPD with acute exacerbation (HCC) AECOPD/bronchiectatic flare. Possibly related to viral illness due to recent sick exposures. Unable to perform COVID testing in office; advised her to swab at home and notify if positive so we can initiate antiviral therapy. We will treat her with prednisone burst and empiric doxycycline 7 day course. CXR to rule out superimposed infection. Encouraged mucociliary clearance therapies.   Patient Instructions  Continue Albuterol inhaler 2 puffs  every 6 hours as needed for shortness of breath or wheezing. Notify if symptoms persist despite rescue inhaler/neb  use.  Restart flonase nasal spray 2 sprays each nostril daily Continue Trelegy 1 puff daily. Brush tongue and rinse mouth afterwards  Prednisone 20 mg daily for 5 days. Take in AM with food Doxycyline 1 tab Twice daily for 7 days. Wear sunscreen while taking - medication can increase risk for sunburns Guaifenesin [phone] mg over the counter Twice daily for congestion/cough Flutter valve 2-3 times a day  COVID swab when you get home - call if positive   Follow up in 2 weeks with Dr. Chase Lin or Alanson Aly. If symptoms do not improve or worsen, please contact office for sooner follow up or seek emergency care.    Bronchiectasis with acute exacerbation ([place]) See above.     Clayton Bibles, NP 12/30/2021  Pt aware and understands NP's role.

## 2021-12-31 DIAGNOSIS — J449 Chronic obstructive pulmonary disease, unspecified: Secondary | ICD-10-CM | POA: Diagnosis not present

## 2021-12-31 DIAGNOSIS — J439 Emphysema, unspecified: Secondary | ICD-10-CM | POA: Diagnosis not present

## 2022-01-01 NOTE — Progress Notes (Signed)
You saw her on 11/6

## 2022-01-01 NOTE — Progress Notes (Signed)
Please notify patient CXR was stable; no evidence of acute infection. Thanks.

## 2022-01-03 DIAGNOSIS — J449 Chronic obstructive pulmonary disease, unspecified: Secondary | ICD-10-CM | POA: Diagnosis not present

## 2022-01-13 ENCOUNTER — Ambulatory Visit: Payer: Medicare Other | Admitting: Nurse Practitioner

## 2022-01-13 ENCOUNTER — Encounter: Payer: Self-pay | Admitting: Nurse Practitioner

## 2022-01-13 VITALS — BP 112/70 | HR 93 | Ht 64.0 in | Wt 158.6 lb

## 2022-01-13 DIAGNOSIS — J449 Chronic obstructive pulmonary disease, unspecified: Secondary | ICD-10-CM | POA: Diagnosis not present

## 2022-01-13 DIAGNOSIS — J479 Bronchiectasis, uncomplicated: Secondary | ICD-10-CM | POA: Diagnosis not present

## 2022-01-13 NOTE — Patient Instructions (Addendum)
Continue Albuterol inhaler 2 puffs every 6 hours as needed for shortness of breath or wheezing. Notify if symptoms persist despite rescue inhaler/neb use.  Continue flonase nasal spray 2 sprays each nostril daily Continue Trelegy 1 puff daily. Brush tongue and rinse mouth afterwards Continue flutter valve 2-3 times a day for chest congestion   Follow up in 3 months with [name] or Alanson Aly. If symptoms do not improve or worsen, please contact office for sooner follow up or seek emergency care.

## 2022-01-13 NOTE — Assessment & Plan Note (Signed)
See above. Continue mucociliary clearance therapies as needed.

## 2022-01-13 NOTE — Assessment & Plan Note (Signed)
Severe COPD with recent exacerbation. Clinically improved today. Continue triple therapy regimen. She will go to the pharmacy to get the RSV vaccine. Up to date on other vaccinations.   Patient Instructions  Continue Albuterol inhaler 2 puffs every 6 hours as needed for shortness of breath or wheezing. Notify if symptoms persist despite rescue inhaler/neb use.  Continue flonase nasal spray 2 sprays each nostril daily Continue Trelegy 1 puff daily. Brush tongue and rinse mouth afterwards Continue flutter valve 2-3 times a day for chest congestion   Follow up in 3 months with [name] or Alanson Aly. If symptoms do not improve or worsen, please contact office for sooner follow up or seek emergency care.

## 2022-01-13 NOTE — Progress Notes (Signed)
$'@Patient'u$  ID: Shelby Lin, female    DOB: Jun 03, 1940, 81 y.o.   MRN: [id number]  Chief Complaint  Patient presents with   Follow-up    Pt f/u she is feeling better, she finished the prednisone but was unable to tolerate the doxycycline.     Referring provider: Carol Ada, MD  HPI: 81 year old female, former smoker (21 pack years) followed for stage III severe COPD by Gold classification and bronchiectasis.  She is a patient of Shelby Lin and was last seen in office on 12/30/2021 by [facility] NP.  Past medical history significant for coronary artery spasms, hypertension, GERD, hypothyroidism, Mnire's disease, fibromyalgia, prediabetes.  TEST/EVENTS:  01/28/2018 CT chest without contrast: Atherosclerosis.  Mild to moderate centrilobular emphysema with diffuse bronchial wall thickening.  Right middle lobe solid 8 x 2 mm pulmonary nodule stable since 02/11/2017 CT chest, considered benign.  Separate 2 mm solid right middle lobe pulmonary nodule is decreased from 4 mm considered benign.  Minimal scattered cylindrical bronchiectasis in right middle lobe and medial bilateral upper lobe is unchanged.  Small hiatal hernia. 09/10/2020 CXR 2 view: Lungs clear.  Heart size normal.  Atherosclerosis.  No acute disease. 01/09/2019 PFTs: FVC 1.93 (73), FEV1 0.95 (48), ratio 49, DLCO uncorrected 10.82 (57) 12/30/2021 CXR 2 view: clear lungs without acute process   01/08/2021: OV with Shelby Lin.  No acute new concerns follow-up after PFTs which are stable in the last 3 years.  Continued Trelegy daily and albuterol as needed.  Continued oxygen as needed.  Follow-up in 9 months.  02/26/2021: OV with Shelby Noffke NP for worsening cough and nasal congestion.  Her symptoms started on 12/24 and have persisted since.  She notes that her cough is productive with clear sputum production. She has had nasal congestion with clear rhinorrhea and sinus pressure/pain. She has noticed a mild increase in her shortness of breath  with exertion as well. She was previously started on doxycyline for 7 days and prednisone for 5. She did notice a slight improvement of her symptoms while on the prednisone and still has 1 dose of doxy left. Extended doxycycline to complete 10 day course and prednisone taper.   12/30/2021: OV with Shelby Loiseau NP for acute visit.  On Saturday, she woke up and noticed that she had increased cough.  Since then she started having some more chest congestion and wheezing.  Yesterday, she started producing phlegm.  Describes it as yellow in color.  She has had a few sick exposures at her church, with confirmed cases of COVID.  She did not do any viral testing at home.  No significant URI symptoms.  Denies any fevers, chills, hemoptysis, increased shortness of breath.  She uses Trelegy every day.  Has not had to use albuterol.  Not using anything over-the-counter for cough.  Does have a flutter valve at home but has not restarted this yet. Treated for AECOPD/bronchiectatic flare with empiric doxycycline and prednisone burst. CXR clear.   01/13/2022: Today - follow up Patient presents today for follow up after being treated for AECOPD. She did have some sick exposures with confirmed COVID cases prior to becoming ill. Viral testing was unavailable in office so she was advised to test at home, which was negative. She did not complete the doxycycline because it gave her very severe indigestion, which resolved after she stopped it. She did complete the prednisone burst and feels much better. Her chest congestion has resolved and she hasn't noticed much coughing. Feels like her breathing  is back to her baseline. She continues on Trelegy daily. Using flutter valve at night.   Allergies  Allergen Reactions   Acyclovir And Related    Allegra [Fexofenadine] Hives   Amoxicillin Hives   Codeine Nausea And Vomiting   Elemental Sulfur Hives   Enalapril Maleate Cough   Hydrocodone Bit-Homatrop Mbr Other (See Comments)   Keflex  [Cephalexin] Hives   Levofloxacin Other (See Comments)    insomnia   Lorabid [Loracarbef] Hives   Misc. Sulfonamide Containing Compounds    Other Other (See Comments)   Sulfa Antibiotics Hives    Other reaction(s): hives    Immunization History  Administered Date(s) Administered   Influenza Split 11/30/2007, 01/17/2015, 01/08/2016, 11/27/2017, 12/14/2018   Influenza, High Dose Seasonal PF 12/01/2012, 12/05/2013, 12/12/2016, 11/27/2017, 12/14/2018, 01/01/2021   Influenza-Unspecified 12/18/2011   PFIZER(Purple Top)SARS-COV-2 Vaccination 05/05/2019, 05/26/2019, 12/12/2019   Pneumococcal Conjugate-13 07/18/2015, 10/16/2017   Pneumococcal Polysaccharide-23 10/23/2008, 10/20/2018   Td 07/13/2007   Tdap 07/27/2017   Zoster, Live 05/04/2014    Past Medical History:  Diagnosis Date   Anxiety    Arthritis    Asthma in child    Atherosclerotic heart disease of native coronary artery without angina pectoris    Cataracts, bilateral    Combined hyperlipidemia    Constipation, unspecified    COPD (chronic obstructive pulmonary disease) (HCC)    Coronary artery spasm (HCC)    Diabetic neuropathy (HCC)    DM (diabetes mellitus) with complications ([place])    Dupuytren's contracture    Dyspepsia    Familial tremor    Fatigue    Fibromyalgia syndrome    GERD (gastroesophageal reflux disease)    Hashimoto's thyroiditis    Hiatal hernia    History of colon polyps    Hx of Hashimoto thyroiditis    Hypercholesteremia    Hypertension    Hypothyroidism, acquired, autoimmune    IBS (irritable bowel syndrome)    Meniere's disease of left ear    Migraine without aura and without status migrainosus, not intractable    Multinodular goiter (nontoxic)    Nonexudative macular degeneration    Raynaud's syndrome without gangrene    Situational stress    Tachycardia    Thyroiditis, autoimmune    Ulcerative colitis, unspecified, without complications (HCC)    Vitamin B 12 deficiency    Vitamin D  deficiency    Wears glasses     Tobacco History: Social History   Tobacco Use  Smoking Status Former   Packs/day: 0.50   Years: 42.00   Total pack years: 21.00   Types: Cigarettes   Quit date: 02/21/2001   Years since quitting: 20.9  Smokeless Tobacco Never   Counseling given: Not Answered   Outpatient Medications Prior to Visit  Medication Sig Dispense Refill   albuterol (VENTOLIN HFA) 108 (90 Base) MCG/ACT inhaler Inhale 1-2 puffs into the lungs every 6 (six) hours as needed for wheezing or shortness of breath. 18 g 6   aspirin EC 81 MG tablet Take 1 tablet (81 mg total) by mouth daily.     benzonatate (TESSALON) 100 MG capsule Take 100 mg by mouth 3 (three) times daily as needed.     Cyanocobalamin (VITAMIN B 12 PO) Take by mouth daily.     DULoxetine (CYMBALTA) 30 MG capsule Take 30 mg by mouth 2 (two) times daily.     fluticasone (FLONASE) 50 MCG/ACT nasal spray Place 1 spray into both nostrils daily. 16 g 2   furosemide (LASIX) 20  MG tablet Take 20 mg by mouth as needed.     hydrochlorothiazide (HYDRODIURIL) 25 MG tablet TAKE 1 TABLET BY MOUTH EVERY DAY IN THE MORNING  1   linaclotide (LINZESS) 72 MCG capsule 1 capsule at least 30 minutes before the first meal of the day on an empty stomach     LORazepam (ATIVAN) 0.5 MG tablet TAKE 1/2 TAB EVERY 8 12 HOURS AS NEEDED FOR ANXIOUSNESS     losartan (COZAAR) 100 MG tablet Take 100 mg by mouth daily.  1   meclizine (ANTIVERT) 25 MG tablet 1/2 to 1 tablet by mouth every 8 hrs as needed for diziness     Multiple Vitamin (MULTIVITAMIN) tablet Take 1 tablet by mouth daily.     Multiple Vitamins-Minerals (PRESERVISION AREDS) TABS See admin instructions.     omeprazole (PRILOSEC) 20 MG capsule Take 20 mg by mouth daily.     Respiratory Therapy Supplies (FLUTTER) DEVI Use device 2-3 times a day to break up congestion 1 each 0   rosuvastatin (CRESTOR) 20 MG tablet Take 1 tablet (20 mg total) by mouth daily. 90 tablet 3   SYNTHROID 88  MCG tablet TAKE DAILY BEFORE BREAKFAST. 1 TABLET 6 DAYS EACH WEEK, BUT TAKE ONLY 1/2 TABLET ON THE 7TH DAY 30 tablet 5   TRELEGY ELLIPTA 100-62.5-25 MCG/ACT AEPB INHALE 1 PUFF BY MOUTH EVERY DAY 60 each 11   VITAMIN D, CHOLECALCIFEROL, PO Take 50,000 Units by mouth once a week.      doxycycline (VIBRA-TABS) 100 MG tablet Take 1 tablet (100 mg total) by mouth 2 (two) times daily. (Patient not taking: Reported on 01/13/2022) 14 tablet 0   RABEprazole (ACIPHEX) 20 MG tablet Take one tablet twice daily. 180 tablet 3   No facility-administered medications prior to visit.     Review of Systems:   Constitutional: No weight loss or gain, night sweats, fevers, chills, fatigue, or lassitude. HEENT: No headaches, difficulty swallowing, tooth/dental problems, or sore throat. No sneezing, itching, ear ache,  nasal congestion CV:  No chest pain, orthopnea, PND, swelling in lower extremities, anasarca, dizziness, palpitations, syncope Resp: +shortness of breath with exertion (baseline); resolving cough. No wheezing. No hemoptysis.  No chest wall deformity GI:  No heartburn, indigestion, abdominal pain, nausea, vomiting, diarrhea, change in bowel habits, loss of appetite, bloody stools.  MSK:  No joint pain or swelling.  No decreased range of motion.  No back pain. Neuro: No dizziness or lightheadedness.  Psych: No depression or anxiety. Mood stable.     Physical Exam:  BP 112/70   Pulse 93   Ht '5\' 4"'$  (1.626 m)   Wt 158 lb 9.6 oz (71.9 kg)   SpO2 96%   BMI 27.22 kg/m   GEN: Pleasant, interactive, well-nourished, well-appearing; in no acute distress. HEENT:  Normocephalic and atraumatic. EACs patent bilaterally. TM pearly gray with present light reflex bilaterally. PERRLA. Sclera white. Nasal turbinates pink, moist and patent bilaterally. No rhinorrhea present. Oropharynx pink and moist, without exudate or edema. No lesions, ulcerations NECK:  Supple w/ fair ROM. No JVD present. Normal carotid  impulses w/o bruits. Thyroid symmetrical with no goiter or nodules palpated. No lymphadenopathy.   CV: RRR, no m/r/g, no peripheral edema. Pulses intact, +2 bilaterally. No cyanosis, pallor or clubbing. PULMONARY:  Unlabored, regular breathing. Clear bilaterally A&P w/o wheezes/rales/rhonchi. No accessory muscle use. No dullness to percussion. GI: BS present and normoactive. Soft, non-tender to palpation. No organomegaly or masses detected. MSK: No erythema, warmth or tenderness.  Cap refil <2 sec all extrem. No deformities or joint swelling noted.  Neuro: A/Ox3. No focal deficits noted.   Skin: Warm, no lesions or rashe Psych: Normal affect and behavior. Judgement and thought content appropriate.     Lab Results:  CBC    Component Value Date/Time   WBC 14.0 (H) 02/28/2018 1732   RBC 3.93 02/28/2018 1732   HGB 11.6 (L) 02/28/2018 1732   HCT 37.0 02/28/2018 1732   PLT 382 02/28/2018 1732   MCV 94.1 02/28/2018 1732   MCH 29.5 02/28/2018 1732   MCHC 31.4 02/28/2018 1732   RDW 12.3 02/28/2018 1732   LYMPHSABS 2.9 02/28/2018 1732   MONOABS 1.2 (H) 02/28/2018 1732   EOSABS 0.3 02/28/2018 1732   BASOSABS 0.1 02/28/2018 1732    BMET    Component Value Date/Time   NA 136 02/28/2018 1732   K 3.5 02/28/2018 1732   CL 98 02/28/2018 1732   CO2 29 02/28/2018 1732   GLUCOSE 133 (H) 02/28/2018 1732   BUN 12 02/28/2018 1732   CREATININE 0.63 02/28/2018 1732   CREATININE 0.59 (L) 02/22/2016 0001   CALCIUM 10.0 06/19/2021 1020   CALCIUM 9.7 04/06/2012 1019   GFRNONAA >60 02/28/2018 1732   GFRAA >60 02/28/2018 1732    BNP    Component Value Date/Time   BNP 25.5 02/28/2018 1732     Imaging:  DG Chest 2 View  Result Date: 12/31/2021 CLINICAL DATA:  COPD EXAM: CHEST - 2 VIEW COMPARISON:  07/16/2021 FINDINGS: Heart size is normal. Aortic atherosclerotic calcification is noted. Mild upper lung emphysema and scarring. No sign of active infiltrate, mass, effusion or collapse. No  significant bone finding. IMPRESSION: No active disease. Aortic atherosclerotic calcification. Mild upper lung emphysema and scarring. Electronically Signed   By: Shelby Chimes M.D.   On: 12/31/2021 15:50   DG Knee Complete 4 Views Left  Result Date: 12/15/2021 CLINICAL DATA:  Trauma, fall EXAM: LEFT KNEE - COMPLETE 4+ VIEW COMPARISON:  None Available. FINDINGS: No evidence of fracture, dislocation, or joint effusion. No evidence of arthropathy or other focal bone abnormality. Soft tissues are unremarkable. IMPRESSION: No fracture or dislocation is seen in left knee. Electronically Signed   By: Shelby Picker M.D.   On: 12/15/2021 13:45   DG Hand Complete Left  Result Date: 12/15/2021 CLINICAL DATA:  Trauma, fall EXAM: LEFT HAND - COMPLETE 3+ VIEW COMPARISON:  None Available. FINDINGS: No recent fracture or dislocation is seen. Degenerative changes are noted in first carpometacarpal joint, first and second metacarpophalangeal joints and distal interphalangeal joints. IMPRESSION: No recent fracture or dislocation is seen in left hand. Degenerative changes are noted in multiple joints as described in the body of the report. Electronically Signed   By: Shelby Picker M.D.   On: 12/15/2021 13:45   DG Wrist Complete Left  Result Date: 12/15/2021 CLINICAL DATA:  Trauma, fall EXAM: LEFT WRIST - COMPLETE 3+ VIEW COMPARISON:  None Available. FINDINGS: No recent fracture or dislocation is seen. Degenerative changes are noted in first carpometacarpal joint with joint space narrowing and bony spurs. There is mild deformity of trapezium and trapezoid, possibly residual from previous injury. There is widening of joint space between scaphoid and distal row of tarsals, possibly related to previous injury. IMPRESSION: No recent fracture or dislocation is seen in left wrist. Other findings as described in the body of the report. Electronically Signed   By: Shelby Picker M.D.   On: 12/15/2021 13:44   CT  Head  Wo Contrast  Result Date: 12/15/2021 CLINICAL DATA:  Head trauma, moderate-severe; Neck trauma (Age >= 65y) EXAM: CT HEAD WITHOUT CONTRAST CT CERVICAL SPINE WITHOUT CONTRAST TECHNIQUE: Multidetector CT imaging of the head and cervical spine was performed following the standard protocol without intravenous contrast. Multiplanar CT image reconstructions of the cervical spine were also generated. RADIATION DOSE REDUCTION: This exam was performed according to the departmental dose-optimization program which includes automated exposure control, adjustment of the mA and/or kV according to patient size and/or use of iterative reconstruction technique. COMPARISON:  10/05/2015 FINDINGS: CT HEAD FINDINGS Brain: No evidence of acute infarction, hemorrhage, hydrocephalus, extra-axial collection or mass lesion/mass effect. Unchanged focal calcification along the right tentorium. Scattered low-density changes within the periventricular and subcortical white matter compatible with chronic microvascular ischemic change. Mild diffuse cerebral volume loss. Vascular: Atherosclerotic calcifications involving the large vessels of the skull base. No unexpected hyperdense vessel. Skull: Normal. Negative for fracture or focal lesion. Sinuses/Orbits: No acute finding. Other: None. CT CERVICAL SPINE FINDINGS Alignment: Facet joints are aligned without dislocation or traumatic listhesis. Dens and lateral masses are aligned. Straightening of the cervical lordosis. Skull base and vertebrae: No acute fracture. No primary bone lesion or focal pathologic process. Congenital nonunion of the posterior arch of C1. Soft tissues and spinal canal: No prevertebral fluid or swelling. No visible canal hematoma. Disc levels: Degenerative disc disease most pronounced at the C5-6 and C6-7 levels. Moderate multilevel bilateral facet arthropathy. Upper chest: Negative. Other: None. IMPRESSION: 1. No acute intracranial abnormality. 2. No acute cervical  spine fracture or subluxation. Electronically Signed   By: Shelby Poke D.O.   On: 12/15/2021 13:32   CT Cervical Spine Wo Contrast  Result Date: 12/15/2021 CLINICAL DATA:  Head trauma, moderate-severe; Neck trauma (Age >= 65y) EXAM: CT HEAD WITHOUT CONTRAST CT CERVICAL SPINE WITHOUT CONTRAST TECHNIQUE: Multidetector CT imaging of the head and cervical spine was performed following the standard protocol without intravenous contrast. Multiplanar CT image reconstructions of the cervical spine were also generated. RADIATION DOSE REDUCTION: This exam was performed according to the departmental dose-optimization program which includes automated exposure control, adjustment of the mA and/or kV according to patient size and/or use of iterative reconstruction technique. COMPARISON:  10/05/2015 FINDINGS: CT HEAD FINDINGS Brain: No evidence of acute infarction, hemorrhage, hydrocephalus, extra-axial collection or mass lesion/mass effect. Unchanged focal calcification along the right tentorium. Scattered low-density changes within the periventricular and subcortical white matter compatible with chronic microvascular ischemic change. Mild diffuse cerebral volume loss. Vascular: Atherosclerotic calcifications involving the large vessels of the skull base. No unexpected hyperdense vessel. Skull: Normal. Negative for fracture or focal lesion. Sinuses/Orbits: No acute finding. Other: None. CT CERVICAL SPINE FINDINGS Alignment: Facet joints are aligned without dislocation or traumatic listhesis. Dens and lateral masses are aligned. Straightening of the cervical lordosis. Skull base and vertebrae: No acute fracture. No primary bone lesion or focal pathologic process. Congenital nonunion of the posterior arch of C1. Soft tissues and spinal canal: No prevertebral fluid or swelling. No visible canal hematoma. Disc levels: Degenerative disc disease most pronounced at the C5-6 and C6-7 levels. Moderate multilevel bilateral facet  arthropathy. Upper chest: Negative. Other: None. IMPRESSION: 1. No acute intracranial abnormality. 2. No acute cervical spine fracture or subluxation. Electronically Signed   By: Shelby Poke D.O.   On: 12/15/2021 13:32         Latest Ref Rng & Units 01/08/2021   10:46 AM 04/01/2017    9:41 AM  PFT Results  FVC-Pre  L 1.93  1.69   FVC-Predicted Pre % 73  61   FVC-Post L  2.02   FVC-Predicted Post %  73   Pre FEV1/FVC % % 49  45   Post FEV1/FCV % %  48   FEV1-Pre L 0.95  0.76   FEV1-Predicted Pre % 48  36   FEV1-Post L  0.96   DLCO uncorrected ml/min/mmHg 10.82  12.81   DLCO UNC% % 57  52   DLCO corrected ml/min/mmHg 10.82    DLCO COR %Predicted % 57    DLVA Predicted % 88  84     No results found for: "NITRICOXIDE"      Assessment & Plan:   Stage 3 severe COPD by GOLD classification (HCC) Severe COPD with recent exacerbation. Clinically improved today. Continue triple therapy regimen. She will go to the pharmacy to get the RSV vaccine. Up to date on other vaccinations.   Patient Instructions  Continue Albuterol inhaler 2 puffs every 6 hours as needed for shortness of breath or wheezing. Notify if symptoms persist despite rescue inhaler/neb use.  Continue flonase nasal spray 2 sprays each nostril daily Continue Trelegy 1 puff daily. Brush tongue and rinse mouth afterwards Continue flutter valve 2-3 times a day for chest congestion   Follow up in 3 months with Shelby Lin or Alanson Aly. If symptoms do not improve or worsen, please contact office for sooner follow up or seek emergency care.    Bronchiectasis without complication ([place]) See above. Continue mucociliary clearance therapies as needed.      Shelby Bibles, NP 01/13/2022  Pt aware and understands NP's role.

## 2022-01-20 ENCOUNTER — Ambulatory Visit
Admission: RE | Admit: 2022-01-20 | Discharge: 2022-01-20 | Disposition: A | Discharge status: Home / Self Care | Payer: Medicare Other | Source: Ambulatory Visit | Attending: Family Medicine | Admitting: Family Medicine

## 2022-01-20 DIAGNOSIS — Z1231 Encounter for screening mammogram for malignant neoplasm of breast: Secondary | ICD-10-CM | POA: Diagnosis not present

## 2022-01-24 DIAGNOSIS — R2689 Other abnormalities of gait and mobility: Secondary | ICD-10-CM | POA: Diagnosis not present

## 2022-01-24 DIAGNOSIS — Z9181 History of falling: Secondary | ICD-10-CM | POA: Diagnosis not present

## 2022-01-24 DIAGNOSIS — M6281 Muscle weakness (generalized): Secondary | ICD-10-CM | POA: Diagnosis not present

## 2022-01-24 DIAGNOSIS — R296 Repeated falls: Secondary | ICD-10-CM | POA: Diagnosis not present

## 2022-01-28 DIAGNOSIS — R2689 Other abnormalities of gait and mobility: Secondary | ICD-10-CM | POA: Diagnosis not present

## 2022-01-28 DIAGNOSIS — R296 Repeated falls: Secondary | ICD-10-CM | POA: Diagnosis not present

## 2022-01-28 DIAGNOSIS — Z9181 History of falling: Secondary | ICD-10-CM | POA: Diagnosis not present

## 2022-01-28 DIAGNOSIS — M6281 Muscle weakness (generalized): Secondary | ICD-10-CM | POA: Diagnosis not present

## 2022-01-31 DIAGNOSIS — R2689 Other abnormalities of gait and mobility: Secondary | ICD-10-CM | POA: Diagnosis not present

## 2022-01-31 DIAGNOSIS — R296 Repeated falls: Secondary | ICD-10-CM | POA: Diagnosis not present

## 2022-01-31 DIAGNOSIS — M6281 Muscle weakness (generalized): Secondary | ICD-10-CM | POA: Diagnosis not present

## 2022-02-02 DIAGNOSIS — J449 Chronic obstructive pulmonary disease, unspecified: Secondary | ICD-10-CM | POA: Diagnosis not present

## 2022-02-04 ENCOUNTER — Other Ambulatory Visit (HOSPITAL_COMMUNITY): Payer: Self-pay | Admitting: Physician Assistant

## 2022-02-04 DIAGNOSIS — K219 Gastro-esophageal reflux disease without esophagitis: Secondary | ICD-10-CM | POA: Diagnosis not present

## 2022-02-04 DIAGNOSIS — R1310 Dysphagia, unspecified: Secondary | ICD-10-CM

## 2022-02-04 DIAGNOSIS — K5900 Constipation, unspecified: Secondary | ICD-10-CM | POA: Diagnosis not present

## 2022-02-10 DIAGNOSIS — M6281 Muscle weakness (generalized): Secondary | ICD-10-CM | POA: Diagnosis not present

## 2022-02-10 DIAGNOSIS — Z9181 History of falling: Secondary | ICD-10-CM | POA: Diagnosis not present

## 2022-02-10 DIAGNOSIS — R2689 Other abnormalities of gait and mobility: Secondary | ICD-10-CM | POA: Diagnosis not present

## 2022-02-10 DIAGNOSIS — R296 Repeated falls: Secondary | ICD-10-CM | POA: Diagnosis not present

## 2022-02-11 ENCOUNTER — Ambulatory Visit (HOSPITAL_COMMUNITY): Payer: Medicare Other

## 2022-02-11 DIAGNOSIS — H6122 Impacted cerumen, left ear: Secondary | ICD-10-CM | POA: Diagnosis not present

## 2022-02-11 DIAGNOSIS — R2681 Unsteadiness on feet: Secondary | ICD-10-CM | POA: Diagnosis not present

## 2022-02-11 DIAGNOSIS — R42 Dizziness and giddiness: Secondary | ICD-10-CM | POA: Diagnosis not present

## 2022-02-11 DIAGNOSIS — H9192 Unspecified hearing loss, left ear: Secondary | ICD-10-CM | POA: Diagnosis not present

## 2022-02-11 DIAGNOSIS — H8102 Meniere's disease, left ear: Secondary | ICD-10-CM | POA: Diagnosis not present

## 2022-02-12 DIAGNOSIS — R296 Repeated falls: Secondary | ICD-10-CM | POA: Diagnosis not present

## 2022-02-12 DIAGNOSIS — M6281 Muscle weakness (generalized): Secondary | ICD-10-CM | POA: Diagnosis not present

## 2022-02-12 DIAGNOSIS — R2689 Other abnormalities of gait and mobility: Secondary | ICD-10-CM | POA: Diagnosis not present

## 2022-02-12 DIAGNOSIS — Z9181 History of falling: Secondary | ICD-10-CM | POA: Diagnosis not present

## 2022-02-14 ENCOUNTER — Ambulatory Visit (HOSPITAL_COMMUNITY)
Admission: RE | Admit: 2022-02-14 | Discharge: 2022-02-14 | Disposition: A | Discharge status: Home / Self Care | Payer: Medicare Other | Source: Ambulatory Visit | Attending: Physician Assistant | Admitting: Physician Assistant

## 2022-02-14 DIAGNOSIS — K219 Gastro-esophageal reflux disease without esophagitis: Secondary | ICD-10-CM | POA: Diagnosis not present

## 2022-02-14 DIAGNOSIS — R1310 Dysphagia, unspecified: Secondary | ICD-10-CM | POA: Diagnosis not present

## 2022-02-21 DIAGNOSIS — R296 Repeated falls: Secondary | ICD-10-CM | POA: Diagnosis not present

## 2022-02-21 DIAGNOSIS — M6281 Muscle weakness (generalized): Secondary | ICD-10-CM | POA: Diagnosis not present

## 2022-02-21 DIAGNOSIS — Z9181 History of falling: Secondary | ICD-10-CM | POA: Diagnosis not present

## 2022-02-21 DIAGNOSIS — R2689 Other abnormalities of gait and mobility: Secondary | ICD-10-CM | POA: Diagnosis not present

## 2022-02-26 DIAGNOSIS — R2689 Other abnormalities of gait and mobility: Secondary | ICD-10-CM | POA: Diagnosis not present

## 2022-02-26 DIAGNOSIS — M6281 Muscle weakness (generalized): Secondary | ICD-10-CM | POA: Diagnosis not present

## 2022-02-26 DIAGNOSIS — Z9181 History of falling: Secondary | ICD-10-CM | POA: Diagnosis not present

## 2022-02-26 DIAGNOSIS — R296 Repeated falls: Secondary | ICD-10-CM | POA: Diagnosis not present

## 2022-03-04 DIAGNOSIS — R2689 Other abnormalities of gait and mobility: Secondary | ICD-10-CM | POA: Diagnosis not present

## 2022-03-04 DIAGNOSIS — M6281 Muscle weakness (generalized): Secondary | ICD-10-CM | POA: Diagnosis not present

## 2022-03-04 DIAGNOSIS — Z9181 History of falling: Secondary | ICD-10-CM | POA: Diagnosis not present

## 2022-03-04 DIAGNOSIS — R296 Repeated falls: Secondary | ICD-10-CM | POA: Diagnosis not present

## 2022-03-05 DIAGNOSIS — J449 Chronic obstructive pulmonary disease, unspecified: Secondary | ICD-10-CM | POA: Diagnosis not present

## 2022-03-07 DIAGNOSIS — M6281 Muscle weakness (generalized): Secondary | ICD-10-CM | POA: Diagnosis not present

## 2022-03-07 DIAGNOSIS — R2689 Other abnormalities of gait and mobility: Secondary | ICD-10-CM | POA: Diagnosis not present

## 2022-03-07 DIAGNOSIS — R296 Repeated falls: Secondary | ICD-10-CM | POA: Diagnosis not present

## 2022-03-07 DIAGNOSIS — Z9181 History of falling: Secondary | ICD-10-CM | POA: Diagnosis not present

## 2022-03-11 DIAGNOSIS — R296 Repeated falls: Secondary | ICD-10-CM | POA: Diagnosis not present

## 2022-03-11 DIAGNOSIS — M6281 Muscle weakness (generalized): Secondary | ICD-10-CM | POA: Diagnosis not present

## 2022-03-11 DIAGNOSIS — R2689 Other abnormalities of gait and mobility: Secondary | ICD-10-CM | POA: Diagnosis not present

## 2022-03-11 DIAGNOSIS — Z9181 History of falling: Secondary | ICD-10-CM | POA: Diagnosis not present

## 2022-03-13 DIAGNOSIS — M6281 Muscle weakness (generalized): Secondary | ICD-10-CM | POA: Diagnosis not present

## 2022-03-13 DIAGNOSIS — Z9181 History of falling: Secondary | ICD-10-CM | POA: Diagnosis not present

## 2022-03-13 DIAGNOSIS — R2689 Other abnormalities of gait and mobility: Secondary | ICD-10-CM | POA: Diagnosis not present

## 2022-03-13 DIAGNOSIS — R296 Repeated falls: Secondary | ICD-10-CM | POA: Diagnosis not present

## 2022-03-18 DIAGNOSIS — R296 Repeated falls: Secondary | ICD-10-CM | POA: Diagnosis not present

## 2022-03-18 DIAGNOSIS — R2689 Other abnormalities of gait and mobility: Secondary | ICD-10-CM | POA: Diagnosis not present

## 2022-03-18 DIAGNOSIS — Z9181 History of falling: Secondary | ICD-10-CM | POA: Diagnosis not present

## 2022-03-18 DIAGNOSIS — M6281 Muscle weakness (generalized): Secondary | ICD-10-CM | POA: Diagnosis not present

## 2022-03-20 DIAGNOSIS — M25562 Pain in left knee: Secondary | ICD-10-CM | POA: Diagnosis not present

## 2022-03-20 DIAGNOSIS — M1712 Unilateral primary osteoarthritis, left knee: Secondary | ICD-10-CM | POA: Diagnosis not present

## 2022-03-25 DIAGNOSIS — R296 Repeated falls: Secondary | ICD-10-CM | POA: Diagnosis not present

## 2022-03-25 DIAGNOSIS — R2689 Other abnormalities of gait and mobility: Secondary | ICD-10-CM | POA: Diagnosis not present

## 2022-03-25 DIAGNOSIS — Z9181 History of falling: Secondary | ICD-10-CM | POA: Diagnosis not present

## 2022-03-25 DIAGNOSIS — M6281 Muscle weakness (generalized): Secondary | ICD-10-CM | POA: Diagnosis not present

## 2022-03-26 DIAGNOSIS — H903 Sensorineural hearing loss, bilateral: Secondary | ICD-10-CM | POA: Diagnosis not present

## 2022-03-27 DIAGNOSIS — R2689 Other abnormalities of gait and mobility: Secondary | ICD-10-CM | POA: Diagnosis not present

## 2022-03-27 DIAGNOSIS — Z9181 History of falling: Secondary | ICD-10-CM | POA: Diagnosis not present

## 2022-03-27 DIAGNOSIS — M6281 Muscle weakness (generalized): Secondary | ICD-10-CM | POA: Diagnosis not present

## 2022-03-27 DIAGNOSIS — R296 Repeated falls: Secondary | ICD-10-CM | POA: Diagnosis not present

## 2022-04-03 DIAGNOSIS — Z9181 History of falling: Secondary | ICD-10-CM | POA: Diagnosis not present

## 2022-04-03 DIAGNOSIS — R2689 Other abnormalities of gait and mobility: Secondary | ICD-10-CM | POA: Diagnosis not present

## 2022-04-03 DIAGNOSIS — M6281 Muscle weakness (generalized): Secondary | ICD-10-CM | POA: Diagnosis not present

## 2022-04-03 DIAGNOSIS — R296 Repeated falls: Secondary | ICD-10-CM | POA: Diagnosis not present

## 2022-04-05 DIAGNOSIS — J449 Chronic obstructive pulmonary disease, unspecified: Secondary | ICD-10-CM | POA: Diagnosis not present

## 2022-04-09 DIAGNOSIS — R296 Repeated falls: Secondary | ICD-10-CM | POA: Diagnosis not present

## 2022-04-09 DIAGNOSIS — M6281 Muscle weakness (generalized): Secondary | ICD-10-CM | POA: Diagnosis not present

## 2022-04-09 DIAGNOSIS — Z9181 History of falling: Secondary | ICD-10-CM | POA: Diagnosis not present

## 2022-04-09 DIAGNOSIS — R2689 Other abnormalities of gait and mobility: Secondary | ICD-10-CM | POA: Diagnosis not present

## 2022-04-10 DIAGNOSIS — E559 Vitamin D deficiency, unspecified: Secondary | ICD-10-CM | POA: Diagnosis not present

## 2022-04-10 DIAGNOSIS — Z9181 History of falling: Secondary | ICD-10-CM | POA: Diagnosis not present

## 2022-04-10 DIAGNOSIS — J449 Chronic obstructive pulmonary disease, unspecified: Secondary | ICD-10-CM | POA: Diagnosis not present

## 2022-04-10 DIAGNOSIS — M797 Fibromyalgia: Secondary | ICD-10-CM | POA: Diagnosis not present

## 2022-04-10 DIAGNOSIS — I2510 Atherosclerotic heart disease of native coronary artery without angina pectoris: Secondary | ICD-10-CM | POA: Diagnosis not present

## 2022-04-10 DIAGNOSIS — E538 Deficiency of other specified B group vitamins: Secondary | ICD-10-CM | POA: Diagnosis not present

## 2022-04-10 DIAGNOSIS — E7800 Pure hypercholesterolemia, unspecified: Secondary | ICD-10-CM | POA: Diagnosis not present

## 2022-04-10 DIAGNOSIS — I10 Essential (primary) hypertension: Secondary | ICD-10-CM | POA: Diagnosis not present

## 2022-04-10 DIAGNOSIS — E039 Hypothyroidism, unspecified: Secondary | ICD-10-CM | POA: Diagnosis not present

## 2022-04-10 DIAGNOSIS — E118 Type 2 diabetes mellitus with unspecified complications: Secondary | ICD-10-CM | POA: Diagnosis not present

## 2022-04-10 DIAGNOSIS — E119 Type 2 diabetes mellitus without complications: Secondary | ICD-10-CM | POA: Diagnosis not present

## 2022-04-10 DIAGNOSIS — K219 Gastro-esophageal reflux disease without esophagitis: Secondary | ICD-10-CM | POA: Diagnosis not present

## 2022-04-10 DIAGNOSIS — Z0000 Encounter for general adult medical examination without abnormal findings: Secondary | ICD-10-CM | POA: Diagnosis not present

## 2022-04-11 ENCOUNTER — Other Ambulatory Visit: Payer: Self-pay | Admitting: Family Medicine

## 2022-04-11 DIAGNOSIS — Z9181 History of falling: Secondary | ICD-10-CM | POA: Diagnosis not present

## 2022-04-11 DIAGNOSIS — M6281 Muscle weakness (generalized): Secondary | ICD-10-CM | POA: Diagnosis not present

## 2022-04-11 DIAGNOSIS — R296 Repeated falls: Secondary | ICD-10-CM | POA: Diagnosis not present

## 2022-04-11 DIAGNOSIS — E2839 Other primary ovarian failure: Secondary | ICD-10-CM

## 2022-04-11 DIAGNOSIS — R2689 Other abnormalities of gait and mobility: Secondary | ICD-10-CM | POA: Diagnosis not present

## 2022-04-11 DIAGNOSIS — M8588 Other specified disorders of bone density and structure, other site: Secondary | ICD-10-CM

## 2022-04-15 DIAGNOSIS — M5000 Cervical disc disorder with myelopathy, unspecified cervical region: Secondary | ICD-10-CM | POA: Diagnosis not present

## 2022-04-15 DIAGNOSIS — M9901 Segmental and somatic dysfunction of cervical region: Secondary | ICD-10-CM | POA: Diagnosis not present

## 2022-04-16 DIAGNOSIS — M5000 Cervical disc disorder with myelopathy, unspecified cervical region: Secondary | ICD-10-CM | POA: Diagnosis not present

## 2022-04-16 DIAGNOSIS — M9901 Segmental and somatic dysfunction of cervical region: Secondary | ICD-10-CM | POA: Diagnosis not present

## 2022-04-17 DIAGNOSIS — Z9181 History of falling: Secondary | ICD-10-CM | POA: Diagnosis not present

## 2022-04-17 DIAGNOSIS — R2689 Other abnormalities of gait and mobility: Secondary | ICD-10-CM | POA: Diagnosis not present

## 2022-04-17 DIAGNOSIS — R296 Repeated falls: Secondary | ICD-10-CM | POA: Diagnosis not present

## 2022-04-17 DIAGNOSIS — M6281 Muscle weakness (generalized): Secondary | ICD-10-CM | POA: Diagnosis not present

## 2022-04-21 DIAGNOSIS — M9901 Segmental and somatic dysfunction of cervical region: Secondary | ICD-10-CM | POA: Diagnosis not present

## 2022-04-21 DIAGNOSIS — M5000 Cervical disc disorder with myelopathy, unspecified cervical region: Secondary | ICD-10-CM | POA: Diagnosis not present

## 2022-04-23 DIAGNOSIS — M9901 Segmental and somatic dysfunction of cervical region: Secondary | ICD-10-CM | POA: Diagnosis not present

## 2022-04-23 DIAGNOSIS — M5000 Cervical disc disorder with myelopathy, unspecified cervical region: Secondary | ICD-10-CM | POA: Diagnosis not present

## 2022-05-02 DIAGNOSIS — M79604 Pain in right leg: Secondary | ICD-10-CM | POA: Diagnosis not present

## 2022-05-04 DIAGNOSIS — J449 Chronic obstructive pulmonary disease, unspecified: Secondary | ICD-10-CM | POA: Diagnosis not present

## 2022-05-19 ENCOUNTER — Ambulatory Visit
Admission: RE | Admit: 2022-05-19 | Discharge: 2022-05-19 | Disposition: A | Discharge status: Home / Self Care | Payer: Medicare Other | Source: Ambulatory Visit | Attending: Family Medicine | Admitting: Family Medicine

## 2022-05-19 DIAGNOSIS — M8589 Other specified disorders of bone density and structure, multiple sites: Secondary | ICD-10-CM | POA: Diagnosis not present

## 2022-05-19 DIAGNOSIS — Z780 Asymptomatic menopausal state: Secondary | ICD-10-CM | POA: Diagnosis not present

## 2022-05-19 DIAGNOSIS — E2839 Other primary ovarian failure: Secondary | ICD-10-CM

## 2022-05-19 DIAGNOSIS — M8588 Other specified disorders of bone density and structure, other site: Secondary | ICD-10-CM

## 2022-05-22 ENCOUNTER — Encounter: Payer: Self-pay | Admitting: Internal Medicine

## 2022-05-22 ENCOUNTER — Ambulatory Visit: Payer: Medicare Other | Admitting: Internal Medicine

## 2022-05-22 VITALS — BP 100/62 | HR 86 | Ht 64.0 in | Wt 153.2 lb

## 2022-05-22 DIAGNOSIS — Z7185 Encounter for immunization safety counseling: Secondary | ICD-10-CM | POA: Diagnosis not present

## 2022-05-22 DIAGNOSIS — G4736 Sleep related hypoventilation in conditions classified elsewhere: Secondary | ICD-10-CM | POA: Diagnosis not present

## 2022-05-22 DIAGNOSIS — R0609 Other forms of dyspnea: Secondary | ICD-10-CM

## 2022-05-22 DIAGNOSIS — J449 Chronic obstructive pulmonary disease, unspecified: Secondary | ICD-10-CM

## 2022-05-22 DIAGNOSIS — J439 Emphysema, unspecified: Secondary | ICD-10-CM

## 2022-05-22 NOTE — Progress Notes (Signed)
IOV 02/11/2017  Chief Complaint  Patient presents with   Advice Only    Self referral for COPD. States she was dx by Dr. Carol Lin x2 years ago. Has SOB with exertion and anxiety and has an occ. cough. Denies any CP.   82 year old female with limited smoking history and quit many years ago.  She tells me for the last few years she has had insidious onset of shortness of breath that is mild to moderate in severity brought on with exertion relieved by rest.  It is stable overall.  Does not much of associated cough except except very mild.  She does have associated ACE inhibitor intake.  The dyspnea is stable.  A few years ago based on pulmonary function test done?  At [facility] health location but I do not have the results patient was started on Spiriva.  There is a chest x-ray in the system from few to several years ago that shows hyperinflation personally visualized and confirmed the findings.  Few to several months ago was switched to incruse/.  She honestly does not know if the symptoms are helping her.  She denies any wheezing orthopnea proximal nocturnal dyspnea hemoptysis fever chills edema.  Some 6 weeks ago she describes what sounds like an asthma or COPD exacerbation treated with antibiotics and prednisone x2 and then back to baseline currently.  At this point in time she wants pulmonary support for her diagnosis of COPD.  Her current symptoms are detailed below.  Blood work August 2018 normal hemoglobin A1c and TSH  OV 04/01/2017  Chief Complaint  Patient presents with   Follow-up    PFT done today and HRCT done 02/11/17.  Pt states she has been doing good.  No real complaints of cough other than acid reflux.   Follow-up investigation for COPD.  Since her last visit she had CT scan of the chest that does show bronchiectasis associated with emphysema.  Pulmonary function test today shows Gold stage II COPD but with significant bronchodilator response.  She feels good and stable.   Cough is very minimal.  Med review shows that she is just on anticholinergic.  She is not on inhaled corticosteroid or bronchodilator.  I noticed that she is on ACE inhibitor.  She tells me that she can cough significantly after a viral infection.  However at baseline she only has mild cough.  She room was having asthma as a child but she says she outgrew it.  Incidental finding of 5 mm right middle lobe nodule in December 2018 CT chest  New issue of her having to undergo upper endoscopy by Dr. Oletta Lin at [place].  She has had colonoscopy a few years ago under propofol and she tolerated this fine.  She says gastroenterology is not aware of her COPD diagnosis and the severity of impairment of the lung function although she is quite functional and has tolerated sedation before.   OV 10/16/2017  Chief Complaint  Patient presents with   Follow-up    Pt states she has been having SOB due to the heat. Denies any cough or CP.    Follow-up Gold stage III COPD: Overall stable since last visit. In fact COPD cat score is improved significantly. It is only for an very minimal symptoms. This is on trouble inhaler therapy. She checked her inhaler technique with me and it looked good.She does she had a bad July because of the heat and humidity but she stated endorse. She  is grieving because she lost her little chihuahua at age 21 - Shelby Lin was his name     Alpha 1 - 129 and MM 02/18/18   Walking desaturation test on 02/11/2017 185 feet x 3 laps on ROOM AIR:  did not desaturate. Rest pulse ox was 96%, final pulse ox was 95%. HR response 99/min at rest to 105/min at peak exertion. Patient Shelby Lin  Did not Desaturate < 88% . Shelby Lin did not  Desaturated </= 3% points. Shelby Lin yes did get tachyardic   IMPRESSION: ct chest 2018 1. Minimal scattered cylindrical bronchiectasis with associated minimal tree-in-bud opacity in the medial upper lobes and medial right middle lobe. Findings could  be due to atypical mycobacterial infection (MAI) . 2. Mild-to-moderate centrilobular emphysema with mild diffuse bronchial wall thickening, compatible with the provided history of COPD. 3. Two small solid pulmonary nodules in the right lung, largest with average diameter 5 mm in the right middle lobe. No follow-up needed if patient is low-risk (and has no known or suspected primary neoplasm). Non-contrast chest CT can be considered in 12 months if patient is high-risk. This recommendation follows the consensus statement: Guidelines for Management of Incidental Pulmonary Nodules Detected on CT Images: From the Fleischner Society 2017; Radiology 2017; 284:228-243. 4. One vessel coronary atherosclerosis.   Aortic Atherosclerosis (ICD10-I70.0) and Emphysema (ICD10-J43.9).     Electronically Signed   By: Shelby Lin M.D.   On: 02/11/2017 16:30  OV 02/01/2018  Subjective:  [name], female , DOB: May 27, 1940 , age 46 y.o. , MRN: [id number] , ADDRESS: [place] [id number]   02/01/2018 -   Chief Complaint  Patient presents with   Follow-up    Pt states that her SOB has improved some, CT review   Follow-up Gold stage III COPD [MM phenotype] postbronchodilator FEV1 0.96 L / 46% with 26% bronchodilator response and DLCO 12.8/52% in February 2019.  CT scan of the chest with mild scattered bronchiectasis.  HPI Shelby Lin 82 y.o. -returns for follow-up of Gold stage III COPD.  She is taking triple inhaler therapy in the form of Incruse and Breo.  Overall her symptoms are stable and well-controlled.  COPD CAT score is 7.  Not much cough some amount of shortness of breath no chest pain at all.  Doing really well overall.  Up-to-date with her vaccines.  She does tell me that primary care physician thought her carbon dioxide level on chemistry was high.  Review of her chart shows, dioxide level December 2017 was normal.  She is asking for Incruse samples because  she is in the donut hole.  However he did not have them.  We talked about starting Trelegy in January 2020 and she is open and willing to look into this option.  Lung nodules: She had CT chest December 2019.  Follow-up from 1 year ago.  Nodules are diminished.  New finding of coronary artery calcification on CT scan December 2019: One-vessel coronary artery calcification seen.  No chest pains.  No prior stress test history.  She says she will talk about it with her primary care physician rather than take a cardiology referral right now.  IMPRESSION CT CHEST 1. Right middle lobe small solid pulmonary nodules are stable to decreased since 02/11/2017 chest CT, considered benign. No new significant pulmonary nodules. 2. Stable minimal scattered cylindrical bronchiectasis in medial upper lobes and medial right middle lobe. No evidence of active pulmonary  infection. 3. One vessel coronary atherosclerosis. 4. Small hiatal hernia.   Aortic Atherosclerosis (ICD10-I70.0) and Emphysema (ICD10-J43.9).     Electronically Signed   By: Shelby Lin M.D.   On: 01/28/2018 12:22  ROS - per HPI   OV 04/24/2020  Subjective:  [name], female , DOB: 1940/11/06 , age 88 y.o. , MRN: [id number] , ADDRESS: [place] [id number] PCP Shelby Ada, MD Patient Care Team: Shelby Ada, MD as PCP - General (Family Medicine) Jerline Pain, MD as PCP - Cardiology (Cardiology)  This Provider for this visit: Treatment Team:  Attending Provider: Lauraine Rinne, NP    04/24/2020 -   Chief Complaint  Patient presents with   Follow-up    Doing well   Stage III COPD MM phenotype  HPI Shelby Lin 82 y.o. -returns for follow-up.  I personally last saw her in December 2019.  After that she is followed up with nurse practitioners with the pandemic.  A year ago January 2020 when she had COVID that was mild.  She continues on Trelegy.  She is rarely used albuterol.  She uses  nighttime oxygen.  Her COPD CAT score is 4.  She is doing really well.  No chest pains no shortness of breath.  Last CT scan of the chest was in 2019.  Last PFT was also in 2019.  She does not smoke.  She is up-to-date with her vaccines.   CAT Score 04/24/2020 02/01/2018  Total CAT Score 4 7     OV 01/08/2021  Subjective:  [name], female , DOB: [birth year] , age 37 y.o. , MRN: [id number] , ADDRESS: [place] [id number] PCP Shelby Ada, MD Patient Care Team: Shelby Ada, MD as PCP - General (Family Medicine) Jerline Pain, MD as PCP - Cardiology (Cardiology)  This Provider for this visit: Treatment Team:  Attending Provider: Brand Males, MD    01/08/2021 -   Chief Complaint  Patient presents with   Follow-up    No new concerns, pft done today   Stage III COPD MM phenotype  HPI Shelby Lin On 82 y.o. -presents for routine follow-up.  After seeing me last visit she did see nurse practitioner.  She had pulmonary function test today and the pulmonary function test is stable in the last 3 years.  She is up-to-date with her vaccination.  She is looking forward to having a great granddaughter in April 2023.  Her granddaughter is now 43 and has been married for 12 years and is finally been able to conceive.  The patient is cautiously optimistic and excited.  She did say that in July 2022 she fell down through her deck and bruised her right thigh and therefore she walks with a limp.  Other than that she is doing fine.  No respiratory exacerbations.  I did caution her about being vigilant about respiratory exacerbations particularly in the fall in the winter especially with human clustering.   CAT Score 04/24/2020 02/01/2018  Total CAT Score 4 7   IMPRESSION: 1. Right middle lobe small solid pulmonary nodules are stable to decreased since 02/11/2017 chest CT, considered benign. No new significant pulmonary nodules. 2. Stable minimal scattered  cylindrical bronchiectasis in medial upper lobes and medial right middle lobe. No evidence of active pulmonary infection. 3. One vessel coronary atherosclerosis. 4. Small hiatal hernia.   Aortic Atherosclerosis (ICD10-I70.0) and Emphysema (ICD10-J43.9).     Electronically Signed  By: Shelby Lin M.D.   On: 01/28/2018 12:22    02/26/2021: OV with Cobb NP for worsening cough and nasal congestion.  Her symptoms started on 12/24 and have persisted since.  She notes that her cough is productive with clear sputum production. She has had nasal congestion with clear rhinorrhea and sinus pressure/pain. She has noticed a mild increase in her shortness of breath with exertion as well. She was previously started on doxycyline for 7 days and prednisone for 5. She did notice a slight improvement of her symptoms while on the prednisone and still has 1 dose of doxy left. Extended doxycycline to complete 10 day course and prednisone taper.   12/30/2021: OV with Cobb NP for acute visit.  On Saturday, she woke up and noticed that she had increased cough.  Since then she started having some more chest congestion and wheezing.  Yesterday, she started producing phlegm.  Describes it as yellow in color.  She has had a few sick exposures at her church, with confirmed cases of COVID.  She did not do any viral testing at home.  No significant URI symptoms.  Denies any fevers, chills, hemoptysis, increased shortness of breath.  She uses Trelegy every day.  Has not had to use albuterol.  Not using anything over-the-counter for cough.  Does have a flutter valve at home but has not restarted this yet. Treated for AECOPD/bronchiectatic flare with empiric doxycycline and prednisone burst. CXR clear.   01/13/2022: Today - follow up Patient presents today for follow up after being treated for AECOPD. She did have some sick exposures with confirmed COVID cases prior to becoming ill. Viral testing was unavailable in office so she  was advised to test at home, which was negative. She did not complete the doxycycline because it gave her very severe indigestion, which resolved after she stopped it. She did complete the prednisone burst and feels much better. Her chest congestion has resolved and she hasn't noticed much coughing. Feels like her breathing is back to her baseline. She continues on Trelegy daily. Using flutter valve at night.    OV 05/22/2022  Subjective:  [name], female , DOB: 09/18/1940 , age 65 y.o. , MRN: [id number] , ADDRESS: [place] [id number] PCP Shelby Ada, MD Patient Care Team: Shelby Ada, MD as PCP - General (Family Medicine) Jerline Pain, MD as PCP - Cardiology (Cardiology)  This Provider for this visit: Treatment Team:  Attending Provider: Brand Males, MD    05/22/2022 -   Chief Complaint  Patient presents with   Follow-up    F/up      HPI Shelby Lin 81 y.o. -Gold stage III COPD patient returning for follow-up.  I personally not seen her in a year and a half.  She states she spent 2023 without any exacerbations and she continued Trelegy.  She enjoyed the birth of her great grandchild in April 2023.  However in 2024 she started getting COPD exacerbation.  She saw a Designer, jewellery.  She started having some GI upset with prednisone but not doxycycline.  Thought she told me but she told the nurse practitioner the opposite.  In any event she says she can always take doxycycline if needed.  Currently not on doxycycline currently on Trelegy.  The only other health issues that she is fallen 7 times in the last 18 months and she occasionally now needs a cane.  She has been attending physical therapy since  October 2023 and has not fallen since.  She does have some residual shortness of breath with exertion.  I did talk to her about the [place] lung cancer screening guidelines that indicate CT scan to 82 years of age.  Her last  CT scan was in 2019.  She technically does not meet criteria for lung cancer screening but she does have some shortness of breath.  She is willing to have a CT.  We talked about RSV vaccine counseling.  She will have it commercially.  Did review the labs and she does have some baseline eosinophils but she is only had 1 course prednisone in the last 14 months.         CAT COPD Symptom & Quality of Life Score (GSK trademark) 0 is no burden. 5 is highest burden 02/11/2017  10/16/2017  02/01/2018   Never Cough -> Cough all the time 1 0   No phlegm in chest -> Chest is full of phlegm 1 0   No chest tightness -> Chest feels very tight 3 0   No dyspnea for 1 flight stairs/hill -> Very dyspneic for 1 flight of stairs 4 1   No limitations for ADL at home -> Very limited with ADL at home 1 0   Confident leaving home -> Not at all confident leaving home 0 0   Sleep soundly -> Do not sleep soundly because of lung condition 3 0   Lots of Energy -> No energy at all 3 3   TOTAL Score (max 40)  16 4 7      Latest Reference Range & Units 01/17/13 11:13 09/26/14 00:01 02/28/18 17:32  Eosinophils Absolute 0.0 - 0.5 K/uL 0.2 0.3 0.3    Simple office walk 185 feet x  3 laps goal with forehead probe 05/22/2022    O2 used ra   Number laps completed 3   Comments about pace [place]. No cane   Resting Pulse Ox/HR 97% and 86/min   Final Pulse Ox/HR 98% and 109/min   Desaturated </= 88% no   Desaturated <= 3% points no   Got Tachycardic >/= 90/min yes   Symptoms at end of test none   Miscellaneous comments x      CT Chest data  DG BONE DENSITY (DXA)  Result Date: 05/19/2022 EXAM: DUAL X-RAY ABSORPTIOMETRY (DXA) FOR BONE MINERAL DENSITY IMPRESSION: Referring Physician:  Carol Lin Your patient completed a bone mineral density test using GE Lunar iDXA system (analysis version: 16). Technologist:   lmn PATIENT: Name: Shelby Lin, Shelby Lin Patient ID: [id number] Birth Date: 1940-09-27 Height: 63.5 in. Sex:  Female Measured: 05/19/2022 Weight: 152.6 lbs. Indications: Advanced Age, Caucasian, COPD, Cymbalta, Estrogen Deficient, Hypothyroid, Hysterectomy, Postmenopausal, Prilosec, Synthroid, Secondary Osteoporosis Fractures: None Treatments: Calcium (E943.0), Vitamin D (E933.5) ASSESSMENT: The BMD measured at AP Spine L1-L4 (L3) is 0.995 g/cm2 with a T-score of -1.5. This patient is considered osteopenic/low bone mass according to [facility]) criteria. The quality of the exam is good. L3 was excluded due to degenerative changes. Site Region Measured Date Measured Age YA BMD Significant CHANGE T-score AP Spine  L1-L4 (L3) 05/19/2022    81.9         -1.5    0.995 g/cm2 AP Spine  L1-L4 (L3) 02/12/2016    75.6         -1.6    0.994 g/cm2 DualFemur Total Left 05/19/2022 81.9 -1.2 0.851 g/cm2 * DualFemur Total Left 02/12/2016    75.6         -  1.0    0.887 g/cm2 DualFemur Total Mean 05/19/2022 81.9 -1.1 0.863 g/cm2 * DualFemur Total Mean 02/12/2016    75.6         -0.8    0.906 g/cm2 World Health Organization [facility]) criteria for post-menopausal, Caucasian Women: Normal       T-score at or above -1 SD Osteopenia   T-score between -1 and -2.5 SD Osteoporosis T-score at or below -2.5 SD RECOMMENDATION: 1. All patients should optimize calcium and vitamin D intake. 2. Consider FDA-approved medical therapies in postmenopausal women and men aged 42 years and older, based on the following: a. A hip or vertebral (clinical or morphometric) fracture. b. T-score = -2.5 at the femoral neck or spine after appropriate evaluation to exclude secondary causes. c. Low bone mass (T-score between -1.0 and -2.5 at the femoral neck or spine) and a 10-year probability of a hip fracture = 3% or a 10-year probability of a major osteoporosis-related fracture = 20% based on the [place]-adapted WHO algorithm. d. Clinician judgment and/or patient preferences may indicate treatment for people with 10-year fracture probabilities above or below  these levels. FOLLOW-UP: Patients with diagnosis of osteoporosis or at high risk for fracture should have regular bone mineral density tests.? Patients eligible for Medicare are allowed routine testing every 2 years.? The testing frequency can be increased to one year for patients who have rapidly progressing disease, are receiving or discontinuing medical therapy to restore bone mass, or have additional risk factors. I have reviewed this study and agree with the findings. [facility] FRAX* 10-year Probability of Fracture Based on femoral neck BMD: DualFemur (Right) Major Osteoporotic Fracture: 11.7% Hip Fracture:                2.5% Population:                  [place] (Caucasian) Risk Factors:                Secondary Osteoporosis *FRAX is a [place] [employer] for Metabolic Bone Disease, a [place] [facility] (WHO) [facility]. ASSESSMENT: The probability of a major osteoporotic fracture is 11.7% within the next ten years. The probability of a hip fracture is 2.5% within the next ten years. Electronically Signed   By: Dorise Bullion III M.D.   On: 05/19/2022 13:28      PFT     Latest Ref Rng & Units 01/08/2021   10:46 AM 04/01/2017    9:41 AM  PFT Results  FVC-Pre L 1.93  1.69   FVC-Predicted Pre % 73  61   FVC-Post L  2.02   FVC-Predicted Post %  73   Pre FEV1/FVC % % 49  45   Post FEV1/FCV % %  48   FEV1-Pre L 0.95  0.76   FEV1-Predicted Pre % 48  36   FEV1-Post L  0.96   DLCO uncorrected ml/min/mmHg 10.82  12.81   DLCO UNC% % 57  52   DLCO corrected ml/min/mmHg 10.82    DLCO COR %Predicted % 57    DLVA Predicted % 88  84        has a past medical history of Anxiety, Arthritis, Asthma in child, Atherosclerotic heart disease of native coronary artery without angina pectoris, Cataracts, bilateral, Combined hyperlipidemia, Constipation, unspecified, COPD (chronic obstructive pulmonary disease) ([place]), Coronary artery  spasm ([place]), Diabetic neuropathy ([place]), DM (diabetes mellitus) with complications ([place]), Dupuytren's contracture, Dyspepsia, Familial tremor, Fatigue,  Fibromyalgia syndrome, GERD (gastroesophageal reflux disease), Hashimoto's thyroiditis, Hiatal hernia, History of colon polyps, Hashimoto thyroiditis, Hypercholesteremia, Hypertension, Hypothyroidism, acquired, autoimmune, IBS (irritable bowel syndrome), Meniere's disease of left ear, Migraine without aura and without status migrainosus, not intractable, Multinodular goiter (nontoxic), Nonexudative macular degeneration, Raynaud's syndrome without gangrene, Situational stress, Tachycardia, Thyroiditis, autoimmune, Ulcerative colitis, unspecified, without complications (HCC), Vitamin B 12 deficiency, Vitamin D deficiency, and Wears glasses.   reports that she quit smoking about 21 years ago. Her smoking use included cigarettes. She has a 21.00 pack-year smoking history. She has never used smokeless tobacco.  Past Surgical History:  Procedure Laterality Date   BREAST EXCISIONAL BIOPSY Left    benign   BREAST EXCISIONAL BIOPSY Right    milk gland removed   BREAST LUMPECTOMY  1990   left   BREAST LUMPECTOMY WITH NEEDLE LOCALIZATION Right 02/28/2013   Procedure: RIGHT BREAST NEEDLE LOCALIZATION LUMPECTOMY ;  Surgeon: Edward Jolly, MD;  Location: [place];  Service: General;  Laterality: Right;   CHOLECYSTECTOMY  2008   lapcholi   DILATION AND CURETTAGE OF UTERUS     LEFT HEART CATHETERIZATION WITH CORONARY ANGIOGRAM N/A 01/26/2013   Procedure: LEFT HEART CATHETERIZATION WITH CORONARY ANGIOGRAM;  Surgeon: Candee Furbish, MD;  Location: [facility] C[facility] LAB;  Service: Cardiovascular;  Laterality: N/A;   PARTIAL HYSTERECTOMY  1971   TONSILLECTOMY      Allergies  Allergen Reactions   Acyclovir And Related    Allegra [Fexofenadine] Hives   Amoxicillin Hives   Codeine Nausea And Vomiting   Elemental Sulfur Hives   Enalapril Maleate Cough    Hydrocodone Bit-Homatrop Mbr Other (See Comments)   Keflex [Cephalexin] Hives   Levofloxacin Other (See Comments)    insomnia   Lorabid [Loracarbef] Hives   Misc. Sulfonamide Containing Compounds    Other Other (See Comments)   Sulfa Antibiotics Hives    Other reaction(s): hives    Immunization History  Administered Date(s) Administered   Influenza Split 11/30/2007, 01/17/2015, 01/08/2016, 11/27/2017, 12/14/2018   Influenza, High Dose Seasonal PF 12/01/2012, 12/05/2013, 12/12/2016, 11/27/2017, 12/14/2018, 01/01/2021   Influenza-Unspecified 12/18/2011   PFIZER(Purple Top)SARS-COV-2 Vaccination 05/05/2019, 05/26/2019, 12/12/2019   Pneumococcal Conjugate-13 07/18/2015, 10/16/2017   Pneumococcal Polysaccharide-23 10/23/2008, 10/20/2018   Td 07/13/2007   Tdap 07/27/2017   Zoster, Live 05/04/2014    Family History  Problem Relation Age of Onset   Cancer Mother    Hypertension Mother    Stroke Mother    Heart attack Father    Sudden death Father    Hypertension Sister    Diabetes Sister    Stroke Sister    Cancer Maternal Uncle        breast,    Cancer Paternal Aunt        breast, ovarian   Thyroid disease Neg Hx      Current Outpatient Medications:    albuterol (VENTOLIN HFA) 108 (90 Base) MCG/ACT inhaler, Inhale 1-2 puffs into the lungs every 6 (six) hours as needed for wheezing or shortness of breath., Disp: 18 g, Rfl: 6   aspirin EC 81 MG tablet, Take 1 tablet (81 mg total) by mouth daily., Disp: , Rfl:    benzonatate (TESSALON) 100 MG capsule, Take 100 mg by mouth 3 (three) times daily as needed., Disp: , Rfl:    Cyanocobalamin (VITAMIN B 12 PO), Take by mouth daily., Disp: , Rfl:    DULoxetine (CYMBALTA) 30 MG capsule, Take 30 mg by mouth 2 (two) times daily., Disp: ,  Rfl:    fluticasone (FLONASE) 50 MCG/ACT nasal spray, Place 1 spray into both nostrils daily., Disp: 16 g, Rfl: 2   furosemide (LASIX) 20 MG tablet, Take 20 mg by mouth as needed., Disp: , Rfl:     hydrochlorothiazide (HYDRODIURIL) 25 MG tablet, TAKE 1 TABLET BY MOUTH EVERY DAY IN THE MORNING, Disp: , Rfl: 1   LORazepam (ATIVAN) 0.5 MG tablet, TAKE 1/2 TAB EVERY 8 12 HOURS AS NEEDED FOR ANXIOUSNESS, Disp: , Rfl:    losartan (COZAAR) 100 MG tablet, Take 100 mg by mouth daily., Disp: , Rfl: 1   meclizine (ANTIVERT) 25 MG tablet, 1/2 to 1 tablet by mouth every 8 hrs as needed for diziness, Disp: , Rfl:    Multiple Vitamin (MULTIVITAMIN) tablet, Take 1 tablet by mouth daily., Disp: , Rfl:    omeprazole (PRILOSEC) 20 MG capsule, Take 20 mg by mouth daily., Disp: , Rfl:    Respiratory Therapy Supplies (FLUTTER) DEVI, Use device 2-3 times a day to break up congestion, Disp: 1 each, Rfl: 0   rosuvastatin (CRESTOR) 20 MG tablet, Take 1 tablet (20 mg total) by mouth daily., Disp: 90 tablet, Rfl: 3   SYNTHROID 88 MCG tablet, TAKE DAILY BEFORE BREAKFAST. 1 TABLET 6 DAYS EACH WEEK, BUT TAKE ONLY 1/2 TABLET ON THE 7TH DAY, Disp: 30 tablet, Rfl: 5   TRELEGY ELLIPTA 100-62.5-25 MCG/ACT AEPB, INHALE 1 PUFF BY MOUTH EVERY DAY, Disp: 60 each, Rfl: 11   VITAMIN D, CHOLECALCIFEROL, PO, Take 50,000 Units by mouth once a week. , Disp: , Rfl:    doxycycline (VIBRA-TABS) 100 MG tablet, Take 1 tablet (100 mg total) by mouth 2 (two) times daily. (Patient not taking: Reported on 01/13/2022), Disp: 14 tablet, Rfl: 0   linaclotide (LINZESS) 72 MCG capsule, 1 capsule at least 30 minutes before the first meal of the day on an empty stomach (Patient not taking: Reported on 05/22/2022), Disp: , Rfl:    Multiple Vitamins-Minerals (PRESERVISION AREDS) TABS, See admin instructions. (Patient not taking: Reported on 05/22/2022), Disp: , Rfl:       Objective:   Vitals:   05/22/22 1558  BP: 100/62  Pulse: 86  SpO2: 97%  Weight: 153 lb 3.2 oz (69.5 kg)  Height: 5\' 4"  (1.626 m)    Estimated body mass index is 26.3 kg/m as calculated from the following:   Height as of this encounter: 5\' 4"  (1.626 m).   Weight as of this  encounter: 153 lb 3.2 oz (69.5 kg).  @WEIGHTCHANGE @  [employer]   05/22/22 1558  Weight: 153 lb 3.2 oz (69.5 kg)     Physical Exam  General: No distress. Looks well Neuro: Alert and Oriented x 3. GCS 15. Speech normal Psych: Pleasant Resp:  Barrel Chest - no.  Wheeze - no, Crackles - no, No overt respiratory distress CVS: Normal heart sounds. Murmurs - no Ext: Stigmata of Connective Tissue Disease - no HEENT: Normal upper airway. PEERL +. No post nasal drip        Assessment:       ICD-10-CM   1. Stage 3 severe COPD by GOLD classification (HCC)  J44.9 CT CHEST HIGH RESOLUTION    2. Nocturnal hypoxemia due to emphysema (HCC)  J43.9 CT CHEST HIGH RESOLUTION   G47.36     3. DOE (dyspnea on exertion)  R06.09 CT CHEST HIGH RESOLUTION    4. Vaccine counseling  Z71.85 CT CHEST HIGH RESOLUTION         Plan:  Patient Instructions     ICD-10-CM   1. Stage 3 severe COPD by GOLD classification ([place])  J44.9     2. Nocturnal hypoxemia due to emphysema (HCC)  J43.9    G47.36     3. DOE (dyspnea on exertion)  R06.09        Stable Gold stage III COPD. Improved from recent flare up    Plan -Continue Trelegy daily [CMA to refill] -Continue albuterol as needed [CMA to do refill) = Continue oxygen at night  - if flare ups recur, you might be a candidate for dupixent - due to ongoing shortness of breath - please get a HRCT supine and prone next few weeks to few months  - get RSV vaccine at a commercial pharmacy    Follow-up -Return in 9 months to see [name]   - CAT score and simple walk test at followup    SIGNATURE    Dr. Brand Males, M.D., F.C.C.P,  Pulmonary and Critical Care Medicine Staff Physician, [place] Director - Interstitial Lung Disease  Program  [facility] [place] [facility] [place], [place], [address]  Pager: [phone], If no answer or between  15:00h - 7:00h: call  [phone] Telephone: [phone]  4:44 PM 05/22/2022

## 2022-05-22 NOTE — Patient Instructions (Addendum)
ICD-10-CM   1. Stage 3 severe COPD by GOLD classification ([place])  J44.9     2. Nocturnal hypoxemia due to emphysema (HCC)  J43.9    G47.36     3. DOE (dyspnea on exertion)  R06.09        Stable Gold stage III COPD. Improved from recent flare up    Plan -Continue Trelegy daily [CMA to refill] -Continue albuterol as needed [CMA to do refill) = Continue oxygen at night  - if flare ups recur, you might be a candidate for dupixent - due to ongoing shortness of breath - please get a HRCT supine and prone next few weeks to few months  - get RSV vaccine at a commercial pharmacy    Follow-up -Return in 9 months to see [name]   - CAT score and simple walk test at followup

## 2022-06-04 DIAGNOSIS — J449 Chronic obstructive pulmonary disease, unspecified: Secondary | ICD-10-CM | POA: Diagnosis not present

## 2022-06-10 DIAGNOSIS — Z8669 Personal history of other diseases of the nervous system and sense organs: Secondary | ICD-10-CM | POA: Diagnosis not present

## 2022-06-10 DIAGNOSIS — R42 Dizziness and giddiness: Secondary | ICD-10-CM | POA: Diagnosis not present

## 2022-06-13 ENCOUNTER — Other Ambulatory Visit: Payer: Self-pay | Admitting: Internal Medicine

## 2022-06-13 ENCOUNTER — Ambulatory Visit (HOSPITAL_BASED_OUTPATIENT_CLINIC_OR_DEPARTMENT_OTHER)
Admission: RE | Admit: 2022-06-13 | Discharge: 2022-06-13 | Disposition: A | Discharge status: Home / Self Care | Payer: Medicare Other | Source: Ambulatory Visit | Attending: Internal Medicine | Admitting: Internal Medicine

## 2022-06-13 DIAGNOSIS — J439 Emphysema, unspecified: Secondary | ICD-10-CM | POA: Insufficient documentation

## 2022-06-13 DIAGNOSIS — J449 Chronic obstructive pulmonary disease, unspecified: Secondary | ICD-10-CM | POA: Diagnosis not present

## 2022-06-13 DIAGNOSIS — G4736 Sleep related hypoventilation in conditions classified elsewhere: Secondary | ICD-10-CM | POA: Diagnosis not present

## 2022-06-13 DIAGNOSIS — Z7185 Encounter for immunization safety counseling: Secondary | ICD-10-CM

## 2022-06-13 DIAGNOSIS — R0609 Other forms of dyspnea: Secondary | ICD-10-CM | POA: Diagnosis not present

## 2022-06-13 DIAGNOSIS — J479 Bronchiectasis, uncomplicated: Secondary | ICD-10-CM | POA: Diagnosis not present

## 2022-06-13 DIAGNOSIS — J432 Centrilobular emphysema: Secondary | ICD-10-CM | POA: Diagnosis not present

## 2022-06-23 ENCOUNTER — Telehealth: Payer: Self-pay | Admitting: Internal Medicine

## 2022-06-23 NOTE — Telephone Encounter (Signed)
Pt called in about her CT results

## 2022-06-24 NOTE — Telephone Encounter (Signed)
Dr. Marchelle Gearing please advise on CT results?

## 2022-06-24 NOTE — Telephone Encounter (Signed)
PT calling again on her CT results. I advised we are waiting on Dr. Marchelle Gearing to advise the nurse so we can let her know.   Nice lady, states she has tried 3 times since the CT was done.   Asked her to be patient and we would call her as soon as we could. Her # is [phone]

## 2022-06-25 NOTE — Telephone Encounter (Signed)
ATC X1 LVM for patient to call the office back 

## 2022-06-25 NOTE — Telephone Encounter (Signed)
Apologize for delay There is no cancer no pulmonary fibrosis She has coronary artery calcification but April 2022 stress test was normal also this is probably a sign of aging and not blockage She has a small hiatal hernia which could be contributing to acid reflux. She does have emphysema and also bronchomalacia which is soft airways from aging and these could be the reasons for shortness of breath.0 Overall CT results are good news because there is nothing new.  She should continue Trelegy.    Current Outpatient Medications:    albuterol (VENTOLIN HFA) 108 (90 Base) MCG/ACT inhaler, Inhale 1-2 puffs into the lungs every 6 (six) hours as needed for wheezing or shortness of breath., Disp: 18 g, Rfl: 6   aspirin EC 81 MG tablet, Take 1 tablet (81 mg total) by mouth daily., Disp: , Rfl:    benzonatate (TESSALON) 100 MG capsule, Take 100 mg by mouth 3 (three) times daily as needed., Disp: , Rfl:    Cyanocobalamin (VITAMIN B 12 PO), Take by mouth daily., Disp: , Rfl:    doxycycline (VIBRA-TABS) 100 MG tablet, Take 1 tablet (100 mg total) by mouth 2 (two) times daily. (Patient not taking: Reported on 01/13/2022), Disp: 14 tablet, Rfl: 0   DULoxetine (CYMBALTA) 30 MG capsule, Take 30 mg by mouth 2 (two) times daily., Disp: , Rfl:    fluticasone (FLONASE) 50 MCG/ACT nasal spray, Place 1 spray into both nostrils daily., Disp: 16 g, Rfl: 2   furosemide (LASIX) 20 MG tablet, Take 20 mg by mouth as needed., Disp: , Rfl:    hydrochlorothiazide (HYDRODIURIL) 25 MG tablet, TAKE 1 TABLET BY MOUTH EVERY DAY IN THE MORNING, Disp: , Rfl: 1   linaclotide (LINZESS) 72 MCG capsule, 1 capsule at least 30 minutes before the first meal of the day on an empty stomach (Patient not taking: Reported on 05/22/2022), Disp: , Rfl:    LORazepam (ATIVAN) 0.5 MG tablet, TAKE 1/2 TAB EVERY 8 12 HOURS AS NEEDED FOR ANXIOUSNESS, Disp: , Rfl:    losartan (COZAAR) 100 MG tablet, Take 100 mg by mouth daily., Disp: , Rfl: 1    meclizine (ANTIVERT) 25 MG tablet, 1/2 to 1 tablet by mouth every 8 hrs as needed for diziness, Disp: , Rfl:    Multiple Vitamin (MULTIVITAMIN) tablet, Take 1 tablet by mouth daily., Disp: , Rfl:    Multiple Vitamins-Minerals (PRESERVISION AREDS) TABS, See admin instructions. (Patient not taking: Reported on 05/22/2022), Disp: , Rfl:    omeprazole (PRILOSEC) 20 MG capsule, Take 20 mg by mouth daily., Disp: , Rfl:    Respiratory Therapy Supplies (FLUTTER) DEVI, Use device 2-3 times a day to break up congestion, Disp: 1 each, Rfl: 0   rosuvastatin (CRESTOR) 20 MG tablet, Take 1 tablet (20 mg total) by mouth daily., Disp: 90 tablet, Rfl: 3   SYNTHROID 88 MCG tablet, TAKE DAILY BEFORE BREAKFAST. 1 TABLET 6 DAYS EACH WEEK, BUT TAKE ONLY 1/2 TABLET ON THE 7TH DAY, Disp: 30 tablet, Rfl: 5   TRELEGY ELLIPTA 100-62.5-25 MCG/ACT AEPB, INHALE 1 PUFF BY MOUTH EVERY DAY, Disp: 60 each, Rfl: 11   VITAMIN D, CHOLECALCIFEROL, PO, Take 50,000 Units by mouth once a week. , Disp: , Rfl:    arrative & Impression  CLINICAL DATA:  82 year old female under evaluation for potential interstitial lung disease. COPD.   EXAM: CT CHEST WITHOUT CONTRAST   TECHNIQUE: Multidetector CT imaging of the chest was performed following the standard protocol without intravenous contrast.  High resolution imaging of the lungs, as well as inspiratory and expiratory imaging, was performed.   RADIATION DOSE REDUCTION: This exam was performed according to the departmental dose-optimization program which includes automated exposure control, adjustment of the mA and/or kV according to patient size and/or use of iterative reconstruction technique.   COMPARISON:  Chest CT 02/07/2018.   FINDINGS: Cardiovascular: Heart size is normal. There is no significant pericardial fluid, thickening or pericardial calcification. There is aortic atherosclerosis, as well as atherosclerosis of the great vessels of the mediastinum and the coronary  arteries, including calcified atherosclerotic plaque in the left main, left anterior descending and left circumflex coronary arteries.   Mediastinum/Nodes: No pathologically enlarged mediastinal or hilar lymph nodes. Please note that accurate exclusion of hilar adenopathy is limited on noncontrast CT scans. Small hiatal hernia. No axillary lymphadenopathy.   Lungs/Pleura: A few scattered areas of very mild cylindrical bronchiectasis are again noted throughout the lungs bilaterally. No generalized regions of ground-glass attenuation, septal thickening, subpleural reticulation, parenchymal banding or honeycombing noted. Diffuse bronchial wall thickening with mild centrilobular and paraseptal emphysema. Inspiratory and expiratory imaging demonstrates partial collapse of the mainstem bronchi (right-greater-than-left), indicative of bronchomalacia. No acute consolidative airspace disease. No pleural effusions. No suspicious appearing pulmonary nodules or masses are noted.   Upper Abdomen: Aortic atherosclerosis.  Status post cholecystectomy.   Musculoskeletal: There are no aggressive appearing lytic or blastic lesions noted in the visualized portions of the skeleton.   IMPRESSION: 1. No findings of interstitial lung disease. 2. A few scattered areas of mild cylindrical bronchiectasis are again noted. 3. Bronchomalacia. 4. Diffuse bronchial wall thickening with mild centrilobular and paraseptal emphysema; imaging findings suggestive of underlying COPD. 5. Aortic atherosclerosis, in addition to left main and 2 vessel coronary artery disease.   Aortic Atherosclerosis (ICD10-I70.0) and Emphysema (ICD10-J43.9).     Electronically Signed   By: Trudie Reed M.D.   On: 06/15/2022 11:32      Result History

## 2022-06-30 NOTE — Telephone Encounter (Signed)
Went over CT results and recc from Dr. Marchelle Gearing. Patient verbalized understanding. NFN

## 2022-07-04 DIAGNOSIS — J449 Chronic obstructive pulmonary disease, unspecified: Secondary | ICD-10-CM | POA: Diagnosis not present

## 2022-07-08 DIAGNOSIS — H02834 Dermatochalasis of left upper eyelid: Secondary | ICD-10-CM | POA: Diagnosis not present

## 2022-07-08 DIAGNOSIS — H02831 Dermatochalasis of right upper eyelid: Secondary | ICD-10-CM | POA: Diagnosis not present

## 2022-07-08 DIAGNOSIS — D3132 Benign neoplasm of left choroid: Secondary | ICD-10-CM | POA: Diagnosis not present

## 2022-07-08 DIAGNOSIS — H353131 Nonexudative age-related macular degeneration, bilateral, early dry stage: Secondary | ICD-10-CM | POA: Diagnosis not present

## 2022-07-08 DIAGNOSIS — H26491 Other secondary cataract, right eye: Secondary | ICD-10-CM | POA: Diagnosis not present

## 2022-07-22 ENCOUNTER — Telehealth: Payer: Self-pay | Admitting: Internal Medicine

## 2022-07-22 MED ORDER — PREDNISONE 10 MG PO TABS: ORAL_TABLET | ORAL | 0 refills | Status: DC

## 2022-07-22 MED ORDER — AZITHROMYCIN 250 MG PO TABS: ORAL_TABLET | ORAL | 0 refills | Status: DC

## 2022-07-22 NOTE — Telephone Encounter (Signed)
Called and spoke with patient. She stated that she believes she is suffering from a COPD exacerbation. She has been coughing up yellowish phlegm for the past 3-4 days. She took some OTC cough syrup yesterday but it did not help. She denied any increased SOB but she has been wheezing more. She confirmed that she is still using her O2 at night. Also denied any fever or body aches.   Confirmed that she is still using her Trelegy once daily and albuterol as needed.   She has an acute app scheduled with MR for this Thursday but wondered if something could be called in sooner for her.   Pharmacy is [facility] [address].   MW, can you please advise since MR is not available today? Thanks!

## 2022-07-22 NOTE — Telephone Encounter (Signed)
Called and spoke with patient. She verbalized understanding. Medications have been sent to pharmacy.  ? ?Nothing further needed at time of call.  ?

## 2022-07-22 NOTE — Telephone Encounter (Signed)
Pls call PT. Sick. Made appt w/Dr. Elvera Lennox for Thurs @ 8:30. No one sooner avail. Please cancel appt if decided it is not needed.   Pharm: [facility] [address]

## 2022-07-22 NOTE — Telephone Encounter (Signed)
Pt called back due to missing call back, pls call her home phone [phone]

## 2022-07-22 NOTE — Telephone Encounter (Signed)
Called patient but she did not answer. Left message for patient to call back.  

## 2022-07-22 NOTE — Telephone Encounter (Signed)
Zpak/ Prednisone 10 mg take  4 each am x 2 days,   2 each am x 2 days,  1 each am x 2 days and stop  

## 2022-07-23 DIAGNOSIS — H93292 Other abnormal auditory perceptions, left ear: Secondary | ICD-10-CM | POA: Diagnosis not present

## 2022-07-23 DIAGNOSIS — H9312 Tinnitus, left ear: Secondary | ICD-10-CM | POA: Diagnosis not present

## 2022-07-23 DIAGNOSIS — M2669 Other specified disorders of temporomandibular joint: Secondary | ICD-10-CM | POA: Diagnosis not present

## 2022-07-23 DIAGNOSIS — R42 Dizziness and giddiness: Secondary | ICD-10-CM | POA: Diagnosis not present

## 2022-07-23 DIAGNOSIS — H6122 Impacted cerumen, left ear: Secondary | ICD-10-CM | POA: Diagnosis not present

## 2022-07-23 NOTE — Progress Notes (Signed)
IOV 02/11/2017  Chief Complaint  Patient presents with   Advice Only    Self referral for COPD. States she was dx by Dr. Merri Brunette x2 years ago. Has SOB with exertion and anxiety and has an occ. cough. Denies any CP.   82 year old female with limited smoking history and quit many years ago.  She tells me for the last few years she has had insidious onset of shortness of breath that is mild to moderate in severity brought on with exertion relieved by rest.  It is stable overall.  Does not much of associated cough except except very mild.  She does have associated ACE inhibitor intake.  The dyspnea is stable.  A few years ago based on pulmonary function test done?  At [facility] health location but I do not have the results patient was started on Spiriva.  There is a chest x-ray in the system from few to several years ago that shows hyperinflation personally visualized and confirmed the findings.  Few to several months ago was switched to incruse/.  She honestly does not know if the symptoms are helping her.  She denies any wheezing orthopnea proximal nocturnal dyspnea hemoptysis fever chills edema.  Some 6 weeks ago she describes what sounds like an asthma or COPD exacerbation treated with antibiotics and prednisone x2 and then back to baseline currently.  At this point in time she wants pulmonary support for her diagnosis of COPD.  Her current symptoms are detailed below.  Blood work August 2018 normal hemoglobin A1c and TSH  OV 04/01/2017  Chief Complaint  Patient presents with   Follow-up    PFT done today and HRCT done 02/11/17.  Pt states she has been doing good.  No real complaints of cough other than acid reflux.   Follow-up investigation for COPD.  Since her last visit she had CT scan of the chest that does show bronchiectasis associated with emphysema.  Pulmonary function test today shows Gold stage II COPD but with significant bronchodilator response.  She feels good and stable.   Cough is very minimal.  Med review shows that she is just on anticholinergic.  She is not on inhaled corticosteroid or bronchodilator.  I noticed that she is on ACE inhibitor.  She tells me that she can cough significantly after a viral infection.  However at baseline she only has mild cough.  She room was having asthma as a child but she says she outgrew it.  Incidental finding of 5 mm right middle lobe nodule in December 2018 CT chest  New issue of her having to undergo upper endoscopy by Dr. Randa Evens at [facility].  She has had colonoscopy a few years ago under propofol and she tolerated this fine.  She says gastroenterology is not aware of her COPD diagnosis and the severity of impairment of the lung function although she is quite functional and has tolerated sedation before.   OV 10/16/2017  Chief Complaint  Patient presents with   Follow-up    Pt states she has been having SOB due to the heat. Denies any cough or CP.    Follow-up Gold stage III COPD: Overall stable since last visit. In fact COPD cat score is improved significantly. It is only for an very minimal symptoms. This is on trouble inhaler therapy. She checked her inhaler technique with me and it looked good.She does she had a bad July because of the heat and humidity but she stated endorse. She  is grieving because she lost her little chihuahua at age 22 - Shelby Lin was his name     Alpha 1 - 129 and MM 02/18/18   Walking desaturation test on 02/11/2017 185 feet x 3 laps on ROOM AIR:  did not desaturate. Rest pulse ox was 96%, final pulse ox was 95%. HR response 99/min at rest to 105/min at peak exertion. Patient Shelby Lin  Did not Desaturate < 88% . Shelby Lin did not  Desaturated </= 3% points. Shelby Lin yes did get tachyardic   IMPRESSION: ct chest 2018 1. Minimal scattered cylindrical bronchiectasis with associated minimal tree-in-bud opacity in the medial upper lobes and medial right middle lobe. Findings could  be due to atypical mycobacterial infection (MAI) . 2. Mild-to-moderate centrilobular emphysema with mild diffuse bronchial wall thickening, compatible with the provided history of COPD. 3. Two small solid pulmonary nodules in the right lung, largest with average diameter 5 mm in the right middle lobe. No follow-up needed if patient is low-risk (and has no known or suspected primary neoplasm). Non-contrast chest CT can be considered in 12 months if patient is high-risk. This recommendation follows the consensus statement: Guidelines for Management of Incidental Pulmonary Nodules Detected on CT Images: From the Fleischner Society 2017; Radiology 2017; 284:228-243. 4. One vessel coronary atherosclerosis.   Aortic Atherosclerosis (ICD10-I70.0) and Emphysema (ICD10-J43.9).     Electronically Signed   By: Shelby Lin M.D.   On: 02/11/2017 16:30  OV 02/01/2018  Subjective:  [name], female , DOB: 03-18-1940 , age 72 y.o. , MRN: [id number] , ADDRESS: [address] [place] [address]   02/01/2018 -   Chief Complaint  Patient presents with   Follow-up    Pt states that her SOB has improved some, CT review   Follow-up Gold stage III COPD [MM phenotype] postbronchodilator FEV1 0.96 L / 46% with 26% bronchodilator response and DLCO 12.8/52% in February 2019.  CT scan of the chest with mild scattered bronchiectasis.  HPI [name] 82 y.o. -returns for follow-up of Gold stage III COPD.  She is taking triple inhaler therapy in the form of Incruse and Breo.  Overall her symptoms are stable and well-controlled.  COPD CAT score is 7.  Not much cough some amount of shortness of breath no chest pain at all.  Doing really well overall.  Up-to-date with her vaccines.  She does tell me that primary care physician thought her carbon dioxide level on chemistry was high.  Review of her chart shows, dioxide level December 2017 was normal.  She is asking for Incruse samples because  she is in the donut hole.  However he did not have them.  We talked about starting Trelegy in January 2020 and she is open and willing to look into this option.  Lung nodules: She had CT chest December 2019.  Follow-up from 1 year ago.  Nodules are diminished.  New finding of coronary artery calcification on CT scan December 2019: One-vessel coronary artery calcification seen.  No chest pains.  No prior stress test history.  She says she will talk about it with her primary care physician rather than take a cardiology referral right now.  IMPRESSION CT CHEST 1. Right middle lobe small solid pulmonary nodules are stable to decreased since 02/11/2017 chest CT, considered benign. No new significant pulmonary nodules. 2. Stable minimal scattered cylindrical bronchiectasis in medial upper lobes and medial right middle lobe. No evidence of active pulmonary  infection. 3. One vessel coronary atherosclerosis. 4. Small hiatal hernia.   Aortic Atherosclerosis (ICD10-I70.0) and Emphysema (ICD10-J43.9).     Electronically Signed   By: Shelby Lin M.D.   On: 01/28/2018 12:22  ROS - per HPI   OV 04/24/2020  Subjective:  [name], female , DOB: 1940/07/08 , age 38 y.o. , MRN: [id number] , ADDRESS: [address] [place] [address] PCP Merri Brunette, MD Patient Care Team: Merri Brunette, MD as PCP - General (Family Medicine) Jake Bathe, MD as PCP - Cardiology (Cardiology)  This Provider for this visit: Treatment Team:  Attending Provider: Coral Ceo, NP    04/24/2020 -   Chief Complaint  Patient presents with   Follow-up    Doing well   Stage III COPD MM phenotype  HPI MATTALYNN BAUMEL 82 y.o. -returns for follow-up.  I personally last saw her in December 2019.  After that she is followed up with nurse practitioners with the pandemic.  A year ago January 2020 when she had COVID that was mild.  She continues on Trelegy.  She is rarely used albuterol.  She uses  nighttime oxygen.  Her COPD CAT score is 4.  She is doing really well.  No chest pains no shortness of breath.  Last CT scan of the chest was in 2019.  Last PFT was also in 2019.  She does not smoke.  She is up-to-date with her vaccines.   CAT Score 04/24/2020 02/01/2018  Total CAT Score 4 7     OV 01/08/2021  Subjective:  [name], female , DOB: 10-Apr-1940 , age 59 y.o. , MRN: [id number] , ADDRESS: [address] [place] [address] PCP Merri Brunette, MD Patient Care Team: Merri Brunette, MD as PCP - General (Family Medicine) Jake Bathe, MD as PCP - Cardiology (Cardiology)  This Provider for this visit: Treatment Team:  Attending Provider: Kalman Shan, MD    01/08/2021 -   Chief Complaint  Patient presents with   Follow-up    No new concerns, pft done today   Stage III COPD MM phenotype  HPI Anjalie Ambroise Resendes 82 y.o. -presents for routine follow-up.  After seeing me last visit she did see nurse practitioner.  She had pulmonary function test today and the pulmonary function test is stable in the last 3 years.  She is up-to-date with her vaccination.  She is looking forward to having a great granddaughter in April 2023.  Her granddaughter is now 3 and has been married for 12 years and is finally been able to conceive.  The patient is cautiously optimistic and excited.  She did say that in July 2022 she fell down through her deck and bruised her right thigh and therefore she walks with a limp.  Other than that she is doing fine.  No respiratory exacerbations.  I did caution her about being vigilant about respiratory exacerbations particularly in the fall in the winter especially with human clustering.   CAT Score 04/24/2020 02/01/2018  Total CAT Score 4 7   IMPRESSION: 1. Right middle lobe small solid pulmonary nodules are stable to decreased since 02/11/2017 chest CT, considered benign. No new significant pulmonary nodules. 2. Stable minimal scattered  cylindrical bronchiectasis in medial upper lobes and medial right middle lobe. No evidence of active pulmonary infection. 3. One vessel coronary atherosclerosis. 4. Small hiatal hernia.   Aortic Atherosclerosis (ICD10-I70.0) and Emphysema (ICD10-J43.9).     Electronically Signed  By: Shelby Lin M.D.   On: 01/28/2018 12:22    02/26/2021: OV with Cobb NP for worsening cough and nasal congestion.  Her symptoms started on 12/24 and have persisted since.  She notes that her cough is productive with clear sputum production. She has had nasal congestion with clear rhinorrhea and sinus pressure/pain. She has noticed a mild increase in her shortness of breath with exertion as well. She was previously started on doxycyline for 7 days and prednisone for 5. She did notice a slight improvement of her symptoms while on the prednisone and still has 1 dose of doxy left. Extended doxycycline to complete 10 day course and prednisone taper.   12/30/2021: OV with Cobb NP for acute visit.  On Saturday, she woke up and noticed that she had increased cough.  Since then she started having some more chest congestion and wheezing.  Yesterday, she started producing phlegm.  Describes it as yellow in color.  She has had a few sick exposures at her church, with confirmed cases of COVID.  She did not do any viral testing at home.  No significant URI symptoms.  Denies any fevers, chills, hemoptysis, increased shortness of breath.  She uses Trelegy every day.  Has not had to use albuterol.  Not using anything over-the-counter for cough.  Does have a flutter valve at home but has not restarted this yet. Treated for AECOPD/bronchiectatic flare with empiric doxycycline and prednisone burst. CXR clear.   01/13/2022: Today - follow up Patient presents today for follow up after being treated for AECOPD. She did have some sick exposures with confirmed COVID cases prior to becoming ill. Viral testing was unavailable in office so she  was advised to test at home, which was negative. She did not complete the doxycycline because it gave her very severe indigestion, which resolved after she stopped it. She did complete the prednisone burst and feels much better. Her chest congestion has resolved and she hasn't noticed much coughing. Feels like her breathing is back to her baseline. She continues on Trelegy daily. Using flutter valve at night.    OV 05/22/2022  Subjective:  [name], female , DOB: June 04, 1940 , age 70 y.o. , MRN: [id number] , ADDRESS: [address] [place] [address] PCP Merri Brunette, MD Patient Care Team: Merri Brunette, MD as PCP - General (Family Medicine) Jake Bathe, MD as PCP - Cardiology (Cardiology)  This Provider for this visit: Treatment Team:  Attending Provider: Kalman Shan, MD    05/22/2022 -   Chief Complaint  Patient presents with   Follow-up    F/up      HPI Shelby Lin 81 y.o. -Gold stage III COPD patient returning for follow-up.  I personally not seen her in a year and a half.  She states she spent 2023 without any exacerbations and she continued Trelegy.  She enjoyed the birth of her great grandchild in April 2023.  However in 2024 she started getting COPD exacerbation.  She saw a Publishing rights manager.  She started having some GI upset with prednisone but not doxycycline.  Thought she told me but she told the nurse practitioner the opposite.  In any event she says she can always take doxycycline if needed.  Currently not on doxycycline currently on Trelegy.  The only other health issues that she is fallen 7 times in the last 18 months and she occasionally now needs a cane.  She has been attending physical therapy since  October 2023 and has not fallen since.  She does have some residual shortness of breath with exertion.  I did talk to her about the American Cancer Society lung cancer screening guidelines that indicate CT scan to 82 years of age.  Her last  CT scan was in 2019.  She technically does not meet criteria for lung cancer screening but she does have some shortness of breath.  She is willing to have a CT.  We talked about RSV vaccine counseling.  She will have it commercially.  Did review the labs and she does have some baseline eosinophils but she is only had 1 course prednisone in the last 14 months.      OV 07/24/2022  Subjective:  [name], female , DOB: 1940/04/07 , age 52 y.o. , MRN: [id number] , ADDRESS: [address] [place] [address] PCP Merri Brunette, MD Patient Care Team: Merri Brunette, MD as PCP - General (Family Medicine) Jake Bathe, MD as PCP - Cardiology (Cardiology)  This Provider for this visit: Treatment Team:  Attending Provider: Kalman Shan, MD  Stage III COPD MM phenotype  07/24/2022 -   Chief Complaint  Patient presents with   Acute Visit    Cough not productive now but was and mucus was yellow     HPI Nefeli Birt Oravec 82 y.o. -this is an acute visit.  She says on Saturday before Memorial Day Monday she started having a cough.  She thinks the pollen is worse this year.  No previous known allergies to seasons.  No sick contacts.  Then Sunday before Memorial Day things got worse with yellow mucus and wheezing.  No fever.  Monday things were even worse.  She then on Tuesday, 07/23/2022 called and acutely.  Dr. Loleta Chance gave her azithromycin and prednisone.  Today second day of prednisone and azithromycin.  She is beginning to feel better but still having a cough.  She is taking Delsym.  She not sure about the cause of the current exacerbations.  Previously she has had high eosinophils but no RAST allergy panel on review of labs.  She also reports that she never got results of the CT on 06/15/2022.  Review the records indicate she got the results on 06/23/2022.  I shared the results again.   Of note she continues to have ongoing chronic headaches.  She has seen ENT.  She is  known to have left-sided Mnire's.  She has had right-sided otalgia.  Temporal mandibular joint arthritis has been suspected.  And referral to her dentist has been made.        CAT COPD Symptom & Quality of Life Score (GSK trademark) 0 is no burden. 5 is highest burden 02/11/2017  10/16/2017  02/01/2018   Never Cough -> Cough all the time 1 0   No phlegm in chest -> Chest is full of phlegm 1 0   No chest tightness -> Chest feels very tight 3 0   No dyspnea for 1 flight stairs/hill -> Very dyspneic for 1 flight of stairs 4 1   No limitations for ADL at home -> Very limited with ADL at home 1 0   Confident leaving home -> Not at all confident leaving home 0 0   Sleep soundly -> Do not sleep soundly because of lung condition 3 0   Lots of Energy -> No energy at all 3 3   TOTAL Score (max 40)  16 4 7  Latest Reference Range & Units 01/17/13 11:13 09/26/14 00:01 02/28/18 17:32  Eosinophils Absolute 0.0 - 0.5 K/uL 0.2 0.3 0.3    Simple office walk 185 feet x  3 laps goal with forehead probe 05/22/2022    O2 used ra   Number laps completed 3   Comments about pace Noraml. No cane   Resting Pulse Ox/HR 97% and 86/min   Final Pulse Ox/HR 98% and 109/min   Desaturated </= 88% no   Desaturated <= 3% points no   Got Tachycardic >/= 90/min yes   Symptoms at end of test none   Miscellaneous comments x     PFT     Latest Ref Rng & Units 01/08/2021   10:46 AM 04/01/2017    9:41 AM  PFT Results  FVC-Pre L 1.93  1.69   FVC-Predicted Pre % 73  61   FVC-Post L  2.02   FVC-Predicted Post %  73   Pre FEV1/FVC % % 49  45   Post FEV1/FCV % %  48   FEV1-Pre L 0.95  0.76   FEV1-Predicted Pre % 48  36   FEV1-Post L  0.96   DLCO uncorrected ml/min/mmHg 10.82  12.81   DLCO UNC% % 57  52   DLCO corrected ml/min/mmHg 10.82    DLCO COR %Predicted % 57    DLVA Predicted % 88  84     Ct chest April 2024   IMPRESSION: 1. No findings of interstitial lung disease. 2. A few scattered  areas of mild cylindrical bronchiectasis are again noted. 3. Bronchomalacia. 4. Diffuse bronchial wall thickening with mild centrilobular and paraseptal emphysema; imaging findings suggestive of underlying COPD. 5. Aortic atherosclerosis, in addition to left main and 2 vessel coronary artery disease.   Aortic Atherosclerosis (ICD10-I70.0) and Emphysema (ICD10-J43.9).     Electronically Signed   By: Trudie Reed M.D.   On: 06/15/2022 11:32   has a past medical history of Anxiety, Arthritis, Asthma in child, Atherosclerotic heart disease of native coronary artery without angina pectoris, Cataracts, bilateral, Combined hyperlipidemia, Constipation, unspecified, COPD (chronic obstructive pulmonary disease) (HCC), Coronary artery spasm (HCC), Diabetic neuropathy (HCC), DM (diabetes mellitus) with complications (HCC), Dupuytren's contracture, Dyspepsia, Familial tremor, Fatigue, Fibromyalgia syndrome, GERD (gastroesophageal reflux disease), Hashimoto's thyroiditis, Hiatal hernia, History of colon polyps, Hashimoto thyroiditis, Hypercholesteremia, Hypertension, Hypothyroidism, acquired, autoimmune, IBS (irritable bowel syndrome), Meniere's disease of left ear, Migraine without aura and without status migrainosus, not intractable, Multinodular goiter (nontoxic), Nonexudative macular degeneration, Raynaud's syndrome without gangrene, Situational stress, Tachycardia, Thyroiditis, autoimmune, Ulcerative colitis, unspecified, without complications (HCC), Vitamin B 12 deficiency, Vitamin D deficiency, and Wears glasses.   reports that she quit smoking about 21 years ago. Her smoking use included cigarettes. She has a 21.00 pack-year smoking history. She has never used smokeless tobacco.  Past Surgical History:  Procedure Laterality Date   BREAST EXCISIONAL BIOPSY Left    benign   BREAST EXCISIONAL BIOPSY Right    milk gland removed   BREAST LUMPECTOMY  1990   left   BREAST LUMPECTOMY WITH NEEDLE  LOCALIZATION Right 02/28/2013   Procedure: RIGHT BREAST NEEDLE LOCALIZATION LUMPECTOMY ;  Surgeon: Mariella Saa, MD;  Location: [facility];  Service: General;  Laterality: Right;   CHOLECYSTECTOMY  2008   lapcholi   DILATION AND CURETTAGE OF UTERUS     LEFT HEART CATHETERIZATION WITH CORONARY ANGIOGRAM N/A 01/26/2013   Procedure: LEFT HEART CATHETERIZATION WITH CORONARY ANGIOGRAM;  Surgeon: Donato Schultz, MD;  Location: [facility];  Service: Cardiovascular;  Laterality: N/A;   PARTIAL HYSTERECTOMY  1971   TONSILLECTOMY      Allergies  Allergen Reactions   Acyclovir And Related    Allegra [Fexofenadine] Hives   Amoxicillin Hives   Codeine Nausea And Vomiting   Elemental Sulfur Hives   Enalapril Maleate Cough   Hydrocodone Bit-Homatrop Mbr Other (See Comments)   Keflex [Cephalexin] Hives   Levofloxacin Other (See Comments)    insomnia   Lorabid [Loracarbef] Hives   Misc. Sulfonamide Containing Compounds    Other Other (See Comments)   Sulfa Antibiotics Hives    Other reaction(s): hives    Immunization History  Administered Date(s) Administered   Influenza Split 11/30/2007, 01/17/2015, 01/08/2016, 11/27/2017, 12/14/2018   Influenza, High Dose Seasonal PF 12/01/2012, 12/05/2013, 12/12/2016, 11/27/2017, 12/14/2018, 01/01/2021   Influenza-Unspecified 12/18/2011   PFIZER(Purple Top)SARS-COV-2 Vaccination 05/05/2019, 05/26/2019, 12/12/2019   Pneumococcal Conjugate-13 07/18/2015, 10/16/2017   Pneumococcal Polysaccharide-23 10/23/2008, 10/20/2018   Td 07/13/2007   Tdap 07/27/2017   Zoster, Live 05/04/2014    Family History  Problem Relation Age of Onset   Cancer Mother    Hypertension Mother    Stroke Mother    Heart attack Father    Sudden death Father    Hypertension Sister    Diabetes Sister    Stroke Sister    Cancer Maternal Uncle        breast,    Cancer Paternal Aunt        breast, ovarian   Thyroid disease Neg Hx      Current Outpatient  Medications:    albuterol (VENTOLIN HFA) 108 (90 Base) MCG/ACT inhaler, Inhale 1-2 puffs into the lungs every 6 (six) hours as needed for wheezing or shortness of breath., Disp: 18 g, Rfl: 6   aspirin EC 81 MG tablet, Take 1 tablet (81 mg total) by mouth daily., Disp: , Rfl:    azithromycin (ZITHROMAX) 250 MG tablet, Take 2 tablets on first day, then 1 tablet daily until finished., Disp: 6 tablet, Rfl: 0   benzonatate (TESSALON) 100 MG capsule, Take 100 mg by mouth 3 (three) times daily as needed., Disp: , Rfl:    Cyanocobalamin (VITAMIN B 12 PO), Take by mouth daily., Disp: , Rfl:    DULoxetine (CYMBALTA) 30 MG capsule, Take 30 mg by mouth 2 (two) times daily., Disp: , Rfl:    fluticasone (FLONASE) 50 MCG/ACT nasal spray, Place 1 spray into both nostrils daily., Disp: 16 g, Rfl: 2   furosemide (LASIX) 20 MG tablet, Take 20 mg by mouth as needed., Disp: , Rfl:    hydrochlorothiazide (HYDRODIURIL) 25 MG tablet, TAKE 1 TABLET BY MOUTH EVERY DAY IN THE MORNING, Disp: , Rfl: 1   LORazepam (ATIVAN) 0.5 MG tablet, TAKE 1/2 TAB EVERY 8 12 HOURS AS NEEDED FOR ANXIOUSNESS, Disp: , Rfl:    losartan (COZAAR) 100 MG tablet, Take 100 mg by mouth daily., Disp: , Rfl: 1   meclizine (ANTIVERT) 25 MG tablet, 1/2 to 1 tablet by mouth every 8 hrs as needed for diziness, Disp: , Rfl:    Multiple Vitamin (MULTIVITAMIN) tablet, Take 1 tablet by mouth daily., Disp: , Rfl:    omeprazole (PRILOSEC) 20 MG capsule, Take 20 mg by mouth daily., Disp: , Rfl:    predniSONE (DELTASONE) 10 MG tablet, Take 4 tablets (40 mg total) by mouth daily with breakfast for 2 days, THEN 2 tablets (20 mg total) daily with breakfast for 2 days, THEN 1  tablet (10 mg total) daily with breakfast for 2 days., Disp: 14 tablet, Rfl: 0   Respiratory Therapy Supplies (FLUTTER) DEVI, Use device 2-3 times a day to break up congestion, Disp: 1 each, Rfl: 0   rosuvastatin (CRESTOR) 20 MG tablet, Take 1 tablet (20 mg total) by mouth daily., Disp: 90 tablet,  Rfl: 3   SYNTHROID 88 MCG tablet, TAKE DAILY BEFORE BREAKFAST. 1 TABLET 6 DAYS EACH WEEK, BUT TAKE ONLY 1/2 TABLET ON THE 7TH DAY, Disp: 30 tablet, Rfl: 5   TRELEGY ELLIPTA 100-62.5-25 MCG/ACT AEPB, INHALE 1 PUFF BY MOUTH EVERY DAY, Disp: 60 each, Rfl: 11   VITAMIN D, CHOLECALCIFEROL, PO, Take 50,000 Units by mouth once a week. , Disp: , Rfl:    doxycycline (VIBRA-TABS) 100 MG tablet, Take 1 tablet (100 mg total) by mouth 2 (two) times daily. (Patient not taking: Reported on 01/13/2022), Disp: 14 tablet, Rfl: 0   linaclotide (LINZESS) 72 MCG capsule, 1 capsule at least 30 minutes before the first meal of the day on an empty stomach (Patient not taking: Reported on 05/22/2022), Disp: , Rfl:    Multiple Vitamins-Minerals (PRESERVISION AREDS) TABS, See admin instructions. (Patient not taking: Reported on 05/22/2022), Disp: , Rfl:       Objective:   Vitals:   07/24/22 0835  BP: 120/62  Pulse: 85  SpO2: 97%  Weight: 154 lb 6.4 oz (70 kg)  Height: 5\' 4"  (1.626 m)    Estimated body mass index is 26.5 kg/m as calculated from the following:   Height as of this encounter: 5\' 4"  (1.626 m).   Weight as of this encounter: 154 lb 6.4 oz (70 kg).  @WEIGHTCHANGE @  [employer]   07/24/22 0835  Weight: 154 lb 6.4 oz (70 kg)     Physical Exam   General: No distress. Look swell O2 at rest: no Cane present: non Sitting in wheel chair: ono Frail: no Obese: no Neuro: Alert and Oriented x 3. GCS 15. Speech normal Psych: Pleasant Resp:  Barrel Chest - no.  Wheeze - no, Crackles - no, No overt respiratory distress CVS: Normal heart sounds. Murmurs - no Ext: Stigmata of Connective Tissue Disease - no HEENT: Normal upper airway. PEERL +. No post nasal drip        Assessment:       ICD-10-CM   1. Stage 3 severe COPD by GOLD classification (HCC)  J44.9     2. COPD with acute exacerbation (HCC)  J44.1     3. Nocturnal hypoxemia due to emphysema (HCC)  J43.9    G47.36          Plan:      Patient Instructions     ICD-10-CM   1. Stage 3 severe COPD by GOLD classification (HCC)  J44.9     2. COPD with acute exacerbation (HCC)  J44.1     3. Nocturnal hypoxemia due to emphysema (HCC)  J43.9    G47.36         Stable Gold stage III COPD. Curently in another flare up on day 2 of treatment    Plan  - continue day 2 of treatment prescribed by Dr Sherene Sires with azihrmycin and prednisoe -Continue Trelegy daily [CMA to refill] -Continue albuterol as needed [CMA to do refill) = Continue oxygen at night  - if flare ups recur, you might be a candidate for dupixent  -in 3 weeks check RAST and IgE profile and cbc with diff     Follow-up -Return  in 6 months to see Dr. Marchelle Gearing   - CAT score and simple walk test at followup    SIGNATURE    Dr. Kalman Shan, M.D., F.C.C.P,  Pulmonary and Critical Care Medicine Staff Physician, [facility] Center Director - Interstitial Lung Disease  Program  Pulmonary Fibrosis [facility] [place], [place], [address]  Pager: [phone], If no answer or between  15:00h - 7:00h: call [phone] Telephone: [phone]  8:58 AM 07/24/2022

## 2022-07-23 NOTE — Patient Instructions (Signed)
ICD-10-CM   1. Stage 3 severe COPD by GOLD classification (HCC)  J44.9     2. Nocturnal hypoxemia due to emphysema (HCC)  J43.9    G47.36     3. DOE (dyspnea on exertion)  R06.09        Stable Gold stage III COPD. Improved from recent flare up    Plan -Continue Trelegy daily [CMA to refill] -Continue albuterol as needed [CMA to do refill) = Continue oxygen at night  - if flare ups recur, you might be a candidate for dupixent - due to ongoing shortness of breath - please get a HRCT supine and prone next few weeks to few months  - get RSV vaccine at a commercial pharmacy    Follow-up -Return in 9 months to see Dr. Alveena Taira   - CAT score and simple walk test at followup 

## 2022-07-24 ENCOUNTER — Ambulatory Visit: Payer: Medicare Other | Admitting: Internal Medicine

## 2022-07-24 ENCOUNTER — Encounter: Payer: Self-pay | Admitting: Internal Medicine

## 2022-07-24 VITALS — BP 120/62 | HR 85 | Ht 64.0 in | Wt 154.4 lb

## 2022-07-24 DIAGNOSIS — J441 Chronic obstructive pulmonary disease with (acute) exacerbation: Secondary | ICD-10-CM | POA: Diagnosis not present

## 2022-07-24 DIAGNOSIS — J449 Chronic obstructive pulmonary disease, unspecified: Secondary | ICD-10-CM | POA: Diagnosis not present

## 2022-07-24 DIAGNOSIS — G4736 Sleep related hypoventilation in conditions classified elsewhere: Secondary | ICD-10-CM | POA: Diagnosis not present

## 2022-07-24 DIAGNOSIS — J439 Emphysema, unspecified: Secondary | ICD-10-CM | POA: Diagnosis not present

## 2022-07-24 MED ORDER — TRELEGY ELLIPTA 100-62.5-25 MCG/ACT IN AEPB: INHALATION_SPRAY | RESPIRATORY_TRACT | 11 refills | Status: DC

## 2022-07-28 ENCOUNTER — Telehealth: Payer: Self-pay | Admitting: Internal Medicine

## 2022-07-28 DIAGNOSIS — J441 Chronic obstructive pulmonary disease with (acute) exacerbation: Secondary | ICD-10-CM

## 2022-07-28 MED ORDER — BENZONATATE 200 MG PO CAPS: 200.0000 mg | ORAL_CAPSULE | Freq: Three times a day (TID) | ORAL | 1 refills | Status: DC | PRN

## 2022-07-28 MED ORDER — PREDNISONE 10 MG PO TABS: ORAL_TABLET | ORAL | 0 refills | Status: DC

## 2022-07-28 MED ORDER — DOXYCYCLINE HYCLATE 100 MG PO TABS: 100.0000 mg | ORAL_TABLET | Freq: Two times a day (BID) | ORAL | 0 refills | Status: DC

## 2022-07-28 NOTE — Telephone Encounter (Signed)
ATC, line went directly to VM. LMTCB.

## 2022-07-28 NOTE — Telephone Encounter (Signed)
Spoke with patient. Patient complains of productive cough with yellow phlegm, sob only when coughing a lot. States cough never got completely better. No fever Patient finished prednisone today and antibiotic Friday  Patient is requesting another round of antibiotic and steroid  Pharmacy [facility] [place] on [address]  Tammy please advise since MR is not available

## 2022-07-28 NOTE — Telephone Encounter (Signed)
ATC X1 LVM for patient to call the office back 

## 2022-07-28 NOTE — Telephone Encounter (Signed)
Doxycycline 1 tab Twice daily for 7 days sent to her pharmacy. Wear sunscreen when taking as this will increase risk for sunburns. Prednisone taper - 4 tabs for 3 days, then 3 tabs for 3 days, 2 tabs for 3 days, then 1 tab for 3 days, then stop. Take in AM with food. I also sent in tessalon perles (benzonatate) 1 capsule Three times a day as needed for cough. She should be taking Mucinex DM or Robitussin DM over the counter to help with thinning mucus and calming down her cough. If no better, needs to have a visit. Thanks.

## 2022-07-29 NOTE — Telephone Encounter (Signed)
I called and spoke with the pt and notified of response per Shelby Lin  She verbalized understanding  Nothing further needed

## 2022-08-04 DIAGNOSIS — J449 Chronic obstructive pulmonary disease, unspecified: Secondary | ICD-10-CM | POA: Diagnosis not present

## 2022-09-01 DIAGNOSIS — M9901 Segmental and somatic dysfunction of cervical region: Secondary | ICD-10-CM | POA: Diagnosis not present

## 2022-09-01 DIAGNOSIS — M5031 Other cervical disc degeneration,  high cervical region: Secondary | ICD-10-CM | POA: Diagnosis not present

## 2022-09-03 DIAGNOSIS — J449 Chronic obstructive pulmonary disease, unspecified: Secondary | ICD-10-CM | POA: Diagnosis not present

## 2022-09-03 DIAGNOSIS — M5031 Other cervical disc degeneration,  high cervical region: Secondary | ICD-10-CM | POA: Diagnosis not present

## 2022-09-03 DIAGNOSIS — M9901 Segmental and somatic dysfunction of cervical region: Secondary | ICD-10-CM | POA: Diagnosis not present

## 2022-09-16 ENCOUNTER — Ambulatory Visit
Admission: RE | Admit: 2022-09-16 | Discharge: 2022-09-16 | Disposition: A | Discharge status: Home / Self Care | Payer: Medicare Other | Source: Ambulatory Visit | Attending: Gastroenterology | Admitting: Gastroenterology

## 2022-09-16 ENCOUNTER — Other Ambulatory Visit: Payer: Self-pay | Admitting: Gastroenterology

## 2022-09-16 DIAGNOSIS — K5909 Other constipation: Secondary | ICD-10-CM | POA: Diagnosis not present

## 2022-09-16 DIAGNOSIS — K5900 Constipation, unspecified: Secondary | ICD-10-CM

## 2022-10-02 DIAGNOSIS — E039 Hypothyroidism, unspecified: Secondary | ICD-10-CM | POA: Diagnosis not present

## 2022-10-02 DIAGNOSIS — M797 Fibromyalgia: Secondary | ICD-10-CM | POA: Diagnosis not present

## 2022-10-02 DIAGNOSIS — K219 Gastro-esophageal reflux disease without esophagitis: Secondary | ICD-10-CM | POA: Diagnosis not present

## 2022-10-02 DIAGNOSIS — E7800 Pure hypercholesterolemia, unspecified: Secondary | ICD-10-CM | POA: Diagnosis not present

## 2022-10-02 DIAGNOSIS — E118 Type 2 diabetes mellitus with unspecified complications: Secondary | ICD-10-CM | POA: Diagnosis not present

## 2022-10-02 DIAGNOSIS — J449 Chronic obstructive pulmonary disease, unspecified: Secondary | ICD-10-CM | POA: Diagnosis not present

## 2022-10-02 DIAGNOSIS — E1140 Type 2 diabetes mellitus with diabetic neuropathy, unspecified: Secondary | ICD-10-CM | POA: Diagnosis not present

## 2022-10-02 DIAGNOSIS — I2510 Atherosclerotic heart disease of native coronary artery without angina pectoris: Secondary | ICD-10-CM | POA: Diagnosis not present

## 2022-10-04 DIAGNOSIS — J449 Chronic obstructive pulmonary disease, unspecified: Secondary | ICD-10-CM | POA: Diagnosis not present

## 2022-10-10 ENCOUNTER — Other Ambulatory Visit: Payer: Self-pay | Admitting: Gastroenterology

## 2022-10-10 ENCOUNTER — Ambulatory Visit: Admission: RE | Admit: 2022-10-10 | Payer: Medicare Other | Source: Ambulatory Visit

## 2022-10-10 DIAGNOSIS — K639 Disease of intestine, unspecified: Secondary | ICD-10-CM | POA: Diagnosis not present

## 2022-10-10 DIAGNOSIS — K5900 Constipation, unspecified: Secondary | ICD-10-CM | POA: Diagnosis not present

## 2022-11-13 DIAGNOSIS — M5134 Other intervertebral disc degeneration, thoracic region: Secondary | ICD-10-CM | POA: Diagnosis not present

## 2022-11-13 DIAGNOSIS — M9902 Segmental and somatic dysfunction of thoracic region: Secondary | ICD-10-CM | POA: Diagnosis not present

## 2022-11-17 DIAGNOSIS — M5134 Other intervertebral disc degeneration, thoracic region: Secondary | ICD-10-CM | POA: Diagnosis not present

## 2022-11-17 DIAGNOSIS — M9902 Segmental and somatic dysfunction of thoracic region: Secondary | ICD-10-CM | POA: Diagnosis not present

## 2022-11-19 ENCOUNTER — Other Ambulatory Visit: Payer: Self-pay | Admitting: Gastroenterology

## 2022-11-19 DIAGNOSIS — M5134 Other intervertebral disc degeneration, thoracic region: Secondary | ICD-10-CM | POA: Diagnosis not present

## 2022-11-19 DIAGNOSIS — M9902 Segmental and somatic dysfunction of thoracic region: Secondary | ICD-10-CM | POA: Diagnosis not present

## 2022-11-25 ENCOUNTER — Ambulatory Visit: Payer: Medicare Other | Admitting: Cardiology

## 2022-11-26 ENCOUNTER — Encounter (HOSPITAL_COMMUNITY): Payer: Self-pay | Admitting: Gastroenterology

## 2022-11-26 ENCOUNTER — Ambulatory Visit (HOSPITAL_COMMUNITY)
Admission: RE | Admit: 2022-11-26 | Discharge: 2022-11-26 | Disposition: A | Discharge status: Home / Self Care | Payer: Medicare Other | Attending: Gastroenterology | Admitting: Gastroenterology

## 2022-11-26 ENCOUNTER — Encounter (HOSPITAL_COMMUNITY)
Admission: RE | Disposition: A | Discharge status: Home / Self Care | Payer: Self-pay | Source: Home / Self Care | Attending: Gastroenterology

## 2022-11-26 DIAGNOSIS — K5900 Constipation, unspecified: Secondary | ICD-10-CM | POA: Insufficient documentation

## 2022-11-26 HISTORY — PX: ANAL RECTAL MANOMETRY: SHX6358

## 2022-11-26 SURGERY — MANOMETRY, ANORECTAL
Anesthesia: Monitor Anesthesia Care | Laterality: Bilateral

## 2022-11-26 NOTE — Progress Notes (Signed)
Anal rectal manometry performed per protocol without complications.  Patient tolerated well.  Balloon expulsion test performed per protocol without complications.  Patient tolerated well.  Expelled balloon after 40 seconds.

## 2022-12-01 ENCOUNTER — Encounter (HOSPITAL_COMMUNITY): Payer: Self-pay | Admitting: Gastroenterology

## 2022-12-02 ENCOUNTER — Ambulatory Visit: Payer: Medicare Other | Attending: Cardiology | Admitting: Cardiology

## 2022-12-02 ENCOUNTER — Encounter: Payer: Self-pay | Admitting: Cardiology

## 2022-12-02 VITALS — BP 112/74 | HR 77 | Ht 64.0 in | Wt 152.8 lb

## 2022-12-02 DIAGNOSIS — I2510 Atherosclerotic heart disease of native coronary artery without angina pectoris: Secondary | ICD-10-CM

## 2022-12-02 DIAGNOSIS — K219 Gastro-esophageal reflux disease without esophagitis: Secondary | ICD-10-CM | POA: Diagnosis not present

## 2022-12-02 NOTE — Progress Notes (Signed)
Cardiology Office Note:  .   Date:  12/02/2022  ID:  Shelby Lin, DOB 06/04/1940, MRN [id number] PCP: Merri Brunette, MD  [facility] Providers Cardiologist:  Donato Schultz, MD     History of Present Illness: .   Shelby Lin is a 82 y.o. female Discussed with the use of AI scribe software   History of Present Illness   The patient, an 82 year old with a history of GERD, coronary artery calcification, and COPD, presents with ongoing issues of constipation. Despite various interventions including dietary changes, stool softeners, and medication, the constipation persists. The patient recently underwent a rectal monometer test, but results are pending.  The patient also reports intermittent chest discomfort, which she attributes to GERD. The discomfort is not constant and the patient has not identified any specific triggers.  In terms of her cardiac history, the patient had a nuclear stress test in April 2022 which showed no ischemia and good heart pump function. She is currently on Rasu statin 20mg  for cholesterol management and her LDL is well-controlled at 49.  The patient quit smoking in 2022 after a 20-year history and currently uses 2 liters of oxygen at night for COPD management. She also uses a flutter valve once a day and takes a puff of Trelegy monthly.  The patient has also been experiencing some swelling in her extremities, for which she takes Lasix as needed. The swelling is not constant and tends to resolve quickly.        Family History - Sister had bypass surgery and stroke - Mother had anginal symptoms - Father had sudden cardiac death  ROS: Uses a cane.   Studies Reviewed: Marland Kitchen   EKG Interpretation Date/Time:  Tuesday December 02 2022 13:59:54 EDT Ventricular Rate:  77 PR Interval:  166 QRS Duration:  78 QT Interval:  384 QTC Calculation: 434 R Axis:   43  Text Interpretation: Normal sinus rhythm Normal ECG When compared with ECG of 28-Feb-2018 17:04,  No significant change since last tracing Confirmed by Donato Schultz ([id number]) on 12/02/2022 2:13:05 PM    Results LABS LDL: 49 (04/10/2022) Hb: 12.7 Cr: 0.6 A1c: 6.3  RADIOLOGY CT scan: Coronary artery calcification  DIAGNOSTIC Nuclear stress test: No ischemia, normal, low risk, good pump function (05/2020) Cardiac catheterization: Normal (2014)  Risk Assessment/Calculations:            Physical Exam:   VS:  BP 112/74   Pulse 77   Ht 5\' 4"  (1.626 m)   Wt 152 lb 12.8 oz (69.3 kg)   SpO2 98%   BMI 26.23 kg/m    Wt Readings from Last 3 Encounters:  12/02/22 152 lb 12.8 oz (69.3 kg)  07/24/22 154 lb 6.4 oz (70 kg)  05/22/22 153 lb 3.2 oz (69.5 kg)    GEN: Well nourished, well developed in no acute distress NECK: No JVD; No carotid bruits CARDIAC: RRR, no murmurs, no rubs, no gallops RESPIRATORY:  Clear to auscultation without rales, wheezing or rhonchi  ABDOMEN: Soft, non-tender, non-distended EXTREMITIES:  No edema; No deformity   ASSESSMENT AND PLAN: .    Assessment and Plan    Chronic Constipation Ongoing despite various interventions. Under the care of Dr. Dulce Sellar. Recent rectal manometry performed, awaiting results. -Continue current management under Dr. Dulce Sellar.  Coronary Artery Disease Stable with coronary artery calcification. No ischemia on nuclear stress test in April 2022. LDL controlled at 49 on Rosuvastatin 20mg  daily. -Continue Rosuvastatin 20mg  daily.  COPD Stable. Quit smoking  in 2022. Using oxygen 2L at night and Flutter valve once daily. On Trelegy inhaler. -Continue current management.  Intermittent Lower Extremity Edema Occasional use of Lasix as needed. No alarming features. -Continue Lasix as needed.  Follow-up in 1 year.              Signed, Donato Schultz, MD

## 2022-12-02 NOTE — Patient Instructions (Signed)

## 2022-12-09 DIAGNOSIS — K5909 Other constipation: Secondary | ICD-10-CM | POA: Diagnosis not present

## 2022-12-09 DIAGNOSIS — S233XXA Sprain of ligaments of thoracic spine, initial encounter: Secondary | ICD-10-CM | POA: Diagnosis not present

## 2022-12-09 DIAGNOSIS — M546 Pain in thoracic spine: Secondary | ICD-10-CM | POA: Diagnosis not present

## 2022-12-18 DIAGNOSIS — H26491 Other secondary cataract, right eye: Secondary | ICD-10-CM | POA: Diagnosis not present

## 2022-12-18 DIAGNOSIS — H18413 Arcus senilis, bilateral: Secondary | ICD-10-CM | POA: Diagnosis not present

## 2022-12-18 DIAGNOSIS — H353131 Nonexudative age-related macular degeneration, bilateral, early dry stage: Secondary | ICD-10-CM | POA: Diagnosis not present

## 2022-12-18 DIAGNOSIS — H26493 Other secondary cataract, bilateral: Secondary | ICD-10-CM | POA: Diagnosis not present

## 2022-12-18 DIAGNOSIS — Z961 Presence of intraocular lens: Secondary | ICD-10-CM | POA: Diagnosis not present

## 2022-12-22 ENCOUNTER — Other Ambulatory Visit: Payer: Self-pay | Admitting: Family Medicine

## 2022-12-22 DIAGNOSIS — J449 Chronic obstructive pulmonary disease, unspecified: Secondary | ICD-10-CM | POA: Diagnosis not present

## 2022-12-22 DIAGNOSIS — K5900 Constipation, unspecified: Secondary | ICD-10-CM | POA: Diagnosis not present

## 2022-12-22 DIAGNOSIS — Z1231 Encounter for screening mammogram for malignant neoplasm of breast: Secondary | ICD-10-CM

## 2022-12-22 DIAGNOSIS — R1310 Dysphagia, unspecified: Secondary | ICD-10-CM | POA: Diagnosis not present

## 2022-12-23 DIAGNOSIS — R1310 Dysphagia, unspecified: Secondary | ICD-10-CM | POA: Diagnosis not present

## 2022-12-23 DIAGNOSIS — Q396 Congenital diverticulum of esophagus: Secondary | ICD-10-CM | POA: Diagnosis not present

## 2022-12-23 DIAGNOSIS — K449 Diaphragmatic hernia without obstruction or gangrene: Secondary | ICD-10-CM | POA: Diagnosis not present

## 2022-12-23 DIAGNOSIS — K222 Esophageal obstruction: Secondary | ICD-10-CM | POA: Diagnosis not present

## 2023-01-01 DIAGNOSIS — M6281 Muscle weakness (generalized): Secondary | ICD-10-CM | POA: Diagnosis not present

## 2023-01-01 DIAGNOSIS — M2569 Stiffness of other specified joint, not elsewhere classified: Secondary | ICD-10-CM | POA: Diagnosis not present

## 2023-01-01 DIAGNOSIS — M546 Pain in thoracic spine: Secondary | ICD-10-CM | POA: Diagnosis not present

## 2023-01-13 DIAGNOSIS — H353131 Nonexudative age-related macular degeneration, bilateral, early dry stage: Secondary | ICD-10-CM | POA: Diagnosis not present

## 2023-01-20 DIAGNOSIS — R1310 Dysphagia, unspecified: Secondary | ICD-10-CM | POA: Diagnosis not present

## 2023-01-20 DIAGNOSIS — N8184 Pelvic muscle wasting: Secondary | ICD-10-CM | POA: Diagnosis not present

## 2023-01-27 ENCOUNTER — Ambulatory Visit: Payer: Medicare Other

## 2023-02-05 ENCOUNTER — Ambulatory Visit
Admission: RE | Admit: 2023-02-05 | Discharge: 2023-02-05 | Disposition: A | Discharge status: Home / Self Care | Payer: Medicare Other | Source: Ambulatory Visit | Attending: Family Medicine | Admitting: Family Medicine

## 2023-02-05 DIAGNOSIS — Z1231 Encounter for screening mammogram for malignant neoplasm of breast: Secondary | ICD-10-CM

## 2023-03-09 ENCOUNTER — Ambulatory Visit: Payer: Medicare Other

## 2023-03-09 ENCOUNTER — Ambulatory Visit: Payer: Medicare Other | Admitting: Internal Medicine

## 2023-03-09 ENCOUNTER — Encounter: Payer: Self-pay | Admitting: Internal Medicine

## 2023-03-09 ENCOUNTER — Other Ambulatory Visit (INDEPENDENT_AMBULATORY_CARE_PROVIDER_SITE_OTHER): Payer: Medicare Other

## 2023-03-09 VITALS — BP 115/77 | HR 80 | Ht 64.0 in | Wt 154.0 lb

## 2023-03-09 DIAGNOSIS — J449 Chronic obstructive pulmonary disease, unspecified: Secondary | ICD-10-CM

## 2023-03-09 DIAGNOSIS — J441 Chronic obstructive pulmonary disease with (acute) exacerbation: Secondary | ICD-10-CM | POA: Diagnosis not present

## 2023-03-09 DIAGNOSIS — M549 Dorsalgia, unspecified: Secondary | ICD-10-CM

## 2023-03-09 DIAGNOSIS — J439 Emphysema, unspecified: Secondary | ICD-10-CM

## 2023-03-09 DIAGNOSIS — R0902 Hypoxemia: Secondary | ICD-10-CM

## 2023-03-09 DIAGNOSIS — G4736 Sleep related hypoventilation in conditions classified elsewhere: Secondary | ICD-10-CM

## 2023-03-09 LAB — CBC WITH DIFFERENTIAL/PLATELET
Basophils Absolute: 0.1 10*3/uL (ref 0.0–0.1)
Basophils Relative: 1.1 % (ref 0.0–3.0)
Eosinophils Absolute: 0.3 10*3/uL (ref 0.0–0.7)
Eosinophils Relative: 3.9 % (ref 0.0–5.0)
HCT: 38.7 % (ref 36.0–46.0)
Hemoglobin: 12.8 g/dL (ref 12.0–15.0)
Lymphocytes Relative: 31.2 % (ref 12.0–46.0)
Lymphs Abs: 2.7 10*3/uL (ref 0.7–4.0)
MCHC: 33 g/dL (ref 30.0–36.0)
MCV: 94.2 fL (ref 78.0–100.0)
Monocytes Absolute: 0.9 10*3/uL (ref 0.1–1.0)
Monocytes Relative: 10.1 % (ref 3.0–12.0)
Neutro Abs: 4.7 10*3/uL (ref 1.4–7.7)
Neutrophils Relative %: 53.7 % (ref 43.0–77.0)
Platelets: 311 10*3/uL (ref 150.0–400.0)
RBC: 4.11 Mil/uL (ref 3.87–5.11)
RDW: 13.1 % (ref 11.5–15.5)
WBC: 8.8 10*3/uL (ref 4.0–10.5)

## 2023-03-09 MED ORDER — TRELEGY ELLIPTA 100-62.5-25 MCG/ACT IN AEPB: INHALATION_SPRAY | RESPIRATORY_TRACT | 11 refills | Status: DC

## 2023-03-09 MED ORDER — ALBUTEROL SULFATE HFA 108 (90 BASE) MCG/ACT IN AERS: 1.0000 | INHALATION_SPRAY | Freq: Four times a day (QID) | RESPIRATORY_TRACT | 6 refills | Status: AC | PRN

## 2023-03-09 NOTE — Progress Notes (Signed)
 IOV 02/11/2017  Chief Complaint  Patient presents with   Advice Only    Self referral for COPD. States she was dx by [name] x2 years ago. Has SOB with exertion and anxiety and has an occ. cough. Denies any CP.   83 year old female with limited smoking history and quit many years ago.  She tells me for the last few years she has had insidious onset of shortness of breath that is mild to moderate in severity brought on with exertion relieved by rest.  It is stable overall.  Does not much of associated cough except except very mild.  She does have associated ACE inhibitor intake.  The dyspnea is stable.  A few years ago based on pulmonary function test done?  At [facility] health location but I do not have the results patient was started on Spiriva.  There is a chest x-ray in the system from few to several years ago that shows hyperinflation personally visualized and confirmed the findings.  Few to several months ago was switched to incruse/.  She honestly does not know if the symptoms are helping her.  She denies any wheezing orthopnea proximal nocturnal dyspnea hemoptysis fever chills edema.  Some 6 weeks ago she describes what sounds like an asthma or COPD exacerbation treated with antibiotics and prednisone  x2 and then back to baseline currently.  At this point in time she wants pulmonary support for her diagnosis of COPD.  Her current symptoms are detailed below.  Blood work August 2018 normal hemoglobin A1c and TSH  OV 04/01/2017  Chief Complaint  Patient presents with   Follow-up    PFT done today and HRCT done 02/11/17.  Pt states she has been doing good.  No real complaints of cough other than acid reflux.   Follow-up investigation for COPD.  Since her last visit she had CT scan of the chest that does show bronchiectasis associated with emphysema.  Pulmonary function test today shows Gold stage II COPD but with significant bronchodilator response.  She feels good and stable.   Cough is very minimal.  Med review shows that she is just on anticholinergic.  She is not on inhaled corticosteroid or bronchodilator.  I noticed that she is on ACE inhibitor.  She tells me that she can cough significantly after a viral infection.  However at baseline she only has mild cough.  She room was having asthma as a child but she says she outgrew it.  Incidental finding of 5 mm right middle lobe nodule in December 2018 CT chest  New issue of her having to undergo upper endoscopy by Dr. Celestia at [facility].  She has had colonoscopy a few years ago under propofol  and she tolerated this fine.  She says gastroenterology is not aware of her COPD diagnosis and the severity of impairment of the lung function although she is quite functional and has tolerated sedation before.   OV 10/16/2017  Chief Complaint  Patient presents with   Follow-up    Pt states she has been having SOB due to the heat. Denies any cough or CP.    Follow-up Gold stage III COPD: Overall stable since last visit. In fact COPD cat score is improved significantly. It is only for an very minimal symptoms. This is on trouble inhaler therapy. She checked her inhaler technique with me and it looked good.She does she had a bad July because of the heat and humidity but she stated endorse. She  is grieving because she lost her little chihuahua at age 83 - Shelby Lin was his name     Alpha 1 - 129 and MM 02/18/18   Walking desaturation test on 02/11/2017 185 feet x 3 laps on ROOM AIR:  did not desaturate. Rest pulse ox was 96%, final pulse ox was 95%. HR response 99/min at rest to 105/min at peak exertion. Patient Shelby Lin  Did not Desaturate < 88% . Shelby Lin did not  Desaturated </= 3% points. Shelby Lin yes did get tachyardic   IMPRESSION: ct chest 2018 1. Minimal scattered cylindrical bronchiectasis with associated minimal tree-in-bud opacity in the medial upper lobes and medial right middle lobe. Findings could  be due to atypical mycobacterial infection (MAI) . 2. Mild-to-moderate centrilobular emphysema with mild diffuse bronchial wall thickening, compatible with the provided history of COPD. 3. Two small solid pulmonary nodules in the right lung, largest with average diameter 5 mm in the right middle lobe. No follow-up needed if patient is low-risk (and has no known or suspected primary neoplasm). Non-contrast chest CT can be considered in 12 months if patient is high-risk. This recommendation follows the consensus statement: Guidelines for Management of Incidental Pulmonary Nodules Detected on CT Images: From the Fleischner Society 2017; Radiology 2017; 284:228-243. 4. One vessel coronary atherosclerosis.   Aortic Atherosclerosis (ICD10-I70.0) and Emphysema (ICD10-J43.9).     Electronically Signed   By: [name]   On: 02/11/2017 16:30  OV 02/01/2018  Subjective:  [name], female , DOB: Jun 08, 1940 , age 83 y.o. , MRN: [id number] , ADDRESS: [address] [place] [address]   02/01/2018 -   Chief Complaint  Patient presents with   Follow-up    Pt states that her SOB has improved some, CT review   Follow-up Gold stage III COPD [MM phenotype] postbronchodilator FEV1 0.96 L / 46% with 26% bronchodilator response and DLCO 12.8/52% in February 2019.  CT scan of the chest with mild scattered bronchiectasis.  HPI Shelby Lin 83 y.o. -returns for follow-up of Gold stage III COPD.  She is taking triple inhaler therapy in the form of Incruse and Breo.  Overall her symptoms are stable and well-controlled.  COPD CAT score is 7.  Not much cough some amount of shortness of breath no chest pain at all.  Doing really well overall.  Up-to-date with her vaccines.  She does tell me that primary care physician thought her carbon dioxide level on chemistry was high.  Review of her chart shows, dioxide level December 2017 was normal.  She is asking for Incruse samples because  she is in the donut hole.  However he did not have them.  We talked about starting Trelegy in January 2020 and she is open and willing to look into this option.  Lung nodules: She had CT chest December 2019.  Follow-up from 1 year ago.  Nodules are diminished.  New finding of coronary artery calcification on CT scan December 2019: One-vessel coronary artery calcification seen.  No chest pains.  No prior stress test history.  She says she will talk about it with her primary care physician rather than take a cardiology referral right now.  IMPRESSION CT CHEST 1. Right middle lobe small solid pulmonary nodules are stable to decreased since 02/11/2017 chest CT, considered benign. No new significant pulmonary nodules. 2. Stable minimal scattered cylindrical bronchiectasis in medial upper lobes and medial right middle lobe. No evidence of active pulmonary  infection. 3. One vessel coronary atherosclerosis. 4. Small hiatal hernia.   Aortic Atherosclerosis (ICD10-I70.0) and Emphysema (ICD10-J43.9).     Electronically Signed   By: [name]   On: 01/28/2018 12:22  ROS - per HPI   OV 04/24/2020  Subjective:  [name], female , DOB: 03-19-1940 , age 63 y.o. , MRN: [id number] , ADDRESS: [address] [place] [address] PCP Claudene Pellet, MD Patient Care Team: Claudene Pellet, MD as PCP - General (Family Medicine) Jeffrie Oneil BROCKS, MD as PCP - Cardiology (Cardiology)  This Provider for this visit: Treatment Team:  Attending Provider: Tonna Redell SQUIBB, NP    04/24/2020 -   Chief Complaint  Patient presents with   Follow-up    Doing well   Stage III COPD MM phenotype  HPI RAYSA BOSAK 83 y.o. -returns for follow-up.  I personally last saw her in December 2019.  After that she is followed up with nurse practitioners with the pandemic.  A year ago January 2020 when she had COVID that was mild.  She continues on Trelegy.  She is rarely used albuterol .  She uses  nighttime oxygen .  Her COPD CAT score is 4.  She is doing really well.  No chest pains no shortness of breath.  Last CT scan of the chest was in 2019.  Last PFT was also in 2019.  She does not smoke.  She is up-to-date with her vaccines.   CAT Score 04/24/2020 02/01/2018  Total CAT Score 4 7     OV 01/08/2021  Subjective:  [name], female , DOB: 1940-07-13 , age 74 y.o. , MRN: [id number] , ADDRESS: [address] [place] [address] PCP Claudene Pellet, MD Patient Care Team: Claudene Pellet, MD as PCP - General (Family Medicine) Jeffrie Oneil BROCKS, MD as PCP - Cardiology (Cardiology)  This Provider for this visit: Treatment Team:  Attending Provider: Geronimo Amel, MD    01/08/2021 -   Chief Complaint  Patient presents with   Follow-up    No new concerns, pft done today   Stage III COPD MM phenotype  HPI Shelby Lin 83 y.o. -presents for routine follow-up.  After seeing me last visit she did see nurse practitioner.  She had pulmonary function test today and the pulmonary function test is stable in the last 3 years.  She is up-to-date with her vaccination.  She is looking forward to having a great granddaughter in April 2023.  Her granddaughter is now 48 and has been married for 12 years and is finally been able to conceive.  The patient is cautiously optimistic and excited.  She did say that in July 2022 she fell down through her deck and bruised her right thigh and therefore she walks with a limp.  Other than that she is doing fine.  No respiratory exacerbations.  I did caution her about being vigilant about respiratory exacerbations particularly in the fall in the winter especially with human clustering.   CAT Score 04/24/2020 02/01/2018  Total CAT Score 4 7   IMPRESSION: 1. Right middle lobe small solid pulmonary nodules are stable to decreased since 02/11/2017 chest CT, considered benign. No new significant pulmonary nodules. 2. Stable minimal scattered  cylindrical bronchiectasis in medial upper lobes and medial right middle lobe. No evidence of active pulmonary infection. 3. One vessel coronary atherosclerosis. 4. Small hiatal hernia.   Aortic Atherosclerosis (ICD10-I70.0) and Emphysema (ICD10-J43.9).     Electronically Signed  By: [name]   On: 01/28/2018 12:22    02/26/2021: OV with Cobb NP for worsening cough and nasal congestion.  Her symptoms started on 12/24 and have persisted since.  She notes that her cough is productive with clear sputum production. She has had nasal congestion with clear rhinorrhea and sinus pressure/pain. She has noticed a mild increase in her shortness of breath with exertion as well. She was previously started on doxycyline for 7 days and prednisone  for 5. She did notice a slight improvement of her symptoms while on the prednisone  and still has 1 dose of doxy left. Extended doxycycline  to complete 10 day course and prednisone  taper.   12/30/2021: OV with Cobb NP for acute visit.  On Saturday, she woke up and noticed that she had increased cough.  Since then she started having some more chest congestion and wheezing.  Yesterday, she started producing phlegm.  Describes it as yellow in color.  She has had a few sick exposures at her church, with confirmed cases of COVID.  She did not do any viral testing at home.  No significant URI symptoms.  Denies any fevers, chills, hemoptysis, increased shortness of breath.  She uses Trelegy every day.  Has not had to use albuterol .  Not using anything over-the-counter for cough.  Does have a flutter valve at home but has not restarted this yet. Treated for AECOPD/bronchiectatic flare with empiric doxycycline  and prednisone  burst. CXR clear.   01/13/2022: Today - follow up Patient presents today for follow up after being treated for AECOPD. She did have some sick exposures with confirmed COVID cases prior to becoming ill. Viral testing was unavailable in office so she  was advised to test at home, which was negative. She did not complete the doxycycline  because it gave her very severe indigestion, which resolved after she stopped it. She did complete the prednisone  burst and feels much better. Her chest congestion has resolved and she hasn't noticed much coughing. Feels like her breathing is back to her baseline. She continues on Trelegy daily. Using flutter valve at night.    OV 05/22/2022  Subjective:  [name], female , DOB: 1940-09-15 , age 49 y.o. , MRN: [id number] , ADDRESS: [address] [place] [address] PCP Claudene Pellet, MD Patient Care Team: Claudene Pellet, MD as PCP - General (Family Medicine) Jeffrie Oneil BROCKS, MD as PCP - Cardiology (Cardiology)  This Provider for this visit: Treatment Team:  Attending Provider: Geronimo Amel, MD    05/22/2022 -   Chief Complaint  Patient presents with   Follow-up    F/up      HPI Shelby JULIANNA Chambers 83 y.o. -Gold stage III COPD patient returning for follow-up.  I personally not seen her in a year and a half.  She states she spent 2023 without any exacerbations and she continued Trelegy.  She enjoyed the birth of her great grandchild in April 2023.  However in 2024 she started getting COPD exacerbation.  She saw a publishing rights manager.  She started having some GI upset with prednisone  but not doxycycline .  Thought she told me but she told the nurse practitioner the opposite.  In any event she says she can always take doxycycline  if needed.  Currently not on doxycycline  currently on Trelegy.  The only other health issues that she is fallen 7 times in the last 18 months and she occasionally now needs a cane.  She has been attending physical therapy since  October 2023 and has not fallen since.  She does have some residual shortness of breath with exertion.  I did talk to her about the American Cancer Society lung cancer screening guidelines that indicate CT scan to 83 years of age.  Her last  CT scan was in 2019.  She technically does not meet criteria for lung cancer screening but she does have some shortness of breath.  She is willing to have a CT.  We talked about RSV vaccine counseling.  She will have it commercially.  Did review the labs and she does have some baseline eosinophils but she is only had 1 course prednisone  in the last 14 months.      OV 07/24/2022  Subjective:  [name], female , DOB: March 03, 1940 , age 87 y.o. , MRN: [id number] , ADDRESS: [address] [place] [address] PCP Claudene Pellet, MD Patient Care Team: Claudene Pellet, MD as PCP - General (Family Medicine) Jeffrie Oneil BROCKS, MD as PCP - Cardiology (Cardiology)  This Provider for this visit: Treatment Team:  Attending Provider: Geronimo Amel, MD  Stage III COPD MM phenotype  07/24/2022 -   Chief Complaint  Patient presents with   Acute Visit    Cough not productive now but was and mucus was yellow     HPI Shelby Lin 82 y.o. -this is an acute visit.  She says on Saturday before Memorial Day Monday she started having a cough.  She thinks the pollen is worse this year.  No previous known allergies to seasons.  No sick contacts.  Then Sunday before Memorial Day things got worse with yellow mucus and wheezing.  No fever.  Monday things were even worse.  She then on Tuesday, 07/23/2022 called and acutely.  Dr. Ozell Brought gave her azithromycin  and prednisone .  Today second day of prednisone  and azithromycin .  She is beginning to feel better but still having a cough.  She is taking Delsym.  She not sure about the cause of the current exacerbations.  Previously she has had high eosinophils but no RAST allergy panel on review of labs.  She also reports that she never got results of the CT on 06/15/2022.  Review the records indicate she got the results on 06/23/2022.  I shared the results again.   Of note she continues to have ongoing chronic headaches.  She has seen ENT.  She is  known to have left-sided Mnire's.  She has had right-sided otalgia.  Temporal mandibular joint arthritis has been suspected.  And referral to her dentist has been made.          Latest Reference Range & Units 01/17/13 11:13 09/26/14 00:01 02/28/18 17:32  Eosinophils Absolute 0.0 - 0.5 K/uL 0.2 0.3 0.3   OV 03/09/2023  Subjective:  [name], female , DOB: 05-31-1940 , age 46 y.o. , MRN: [id number] , ADDRESS: [address] [place] [address] PCP Claudene Pellet, MD Patient Care Team: Claudene Pellet, MD as PCP - General (Family Medicine) Jeffrie Oneil BROCKS, MD as PCP - Cardiology (Cardiology)  This Provider for this visit: Treatment Team:  Attending Provider: Geronimo Amel, MD    03/09/2023 -   Chief Complaint  Patient presents with   Follow-up    Pt states she has been having left side back pain. Using inhalers, no other concerns      HPI Shelby Lin 83 y.o. -advanced COPD patient presents for follow-up she is on baseline nighttime  oxygen .  She has new onset exercise hypoxemia compared to March 2024 during a sit/stand test today.  However she is not feeling it.  She tells me that she had a good Christmas and started the New Year's season quite well she feels the COPD self is stable.  She did have repeated flareups in the early part of 2020 for at least 3 but since June 2024 no further exacerbations no hospitalizations at all no emergency room visits.  Her main issue right now with some constipation for which she is going to start pelvic floor exercises but she says she is also suffering from new onset left lower /infrascapular pain.  This pain is getting worse.  On and off.  When it is severe it feels like it is grabbing and poking her.  It does not wake her up.  Sometimes it is mild on average its moderate this no associated diaphoresis.  Her last CT scan of the chest was in April 2024 and was reassuring.  However the symptoms are new.  Of note because of  her recurrent COPD exacerbation I told her to get a IgE and CBC with differential last time but this has not been done she is willing to get that done at this time.   CAT COPD Symptom & Quality of Life Score (GSK trademark) 0 is no burden. 5 is highest burden 02/11/2017  10/16/2017  02/01/2018   Never Cough -> Cough all the time 1 0   No phlegm in chest -> Chest is full of phlegm 1 0   No chest tightness -> Chest feels very tight 3 0   No dyspnea for 1 flight stairs/hill -> Very dyspneic for 1 flight of stairs 4 1   No limitations for ADL at home -> Very limited with ADL at home 1 0   Confident leaving home -> Not at all confident leaving home 0 0   Sleep soundly -> Do not sleep soundly because of lung condition 3 0   Lots of Energy -> No energy at all 3 3   TOTAL Score (max 40)  16 4 7      Simple office walk 185 feet x  3 laps goal with forehead probe 05/22/2022  03/09/2023   O2 used ra ra room air  Number laps completed 3 Sit stand x 15  Comments about pace Noraml. No cane   Resting Pulse Ox/HR 97% and 86/min And 94% with a heart rate of 63  Final Pulse Ox/HR 98% and 109/min 84% with a heart rate of 62  Desaturated </= 88% no   Desaturated <= 3% points no   Got Tachycardic >/= 90/min yes   Symptoms at end of test none   Miscellaneous comments x New onset exercise hypoxemia     PFT    Latest Ref Rng & Units 01/08/2021   10:46 AM 04/01/2017    9:41 AM  PFT Results  FVC-Pre L 1.93  1.69   FVC-Predicted Pre % 73  61   FVC-Post L  2.02   FVC-Predicted Post %  73   Pre FEV1/FVC % % 49  45   Post FEV1/FCV % %  48   FEV1-Pre L 0.95  0.76   FEV1-Predicted Pre % 48  36   FEV1-Post L  0.96   DLCO uncorrected ml/min/mmHg 10.82  12.81   DLCO UNC% % 57  52   DLCO corrected ml/min/mmHg 10.82    DLCO COR %Predicted % 57  DLVA Predicted % 88  84         LAB RESULTS last 96 hours No results found.  LAB RESULTS last 90 days No results found for this or any previous visit  (from the past 2160 hours).       has a past medical history of Anxiety, Arthritis, Asthma in child, Atherosclerotic heart disease of native coronary artery without angina pectoris, Cataracts, bilateral, Combined hyperlipidemia, Constipation, unspecified, COPD (chronic obstructive pulmonary disease) (HCC), Coronary artery spasm (HCC), Diabetic neuropathy (HCC), DM (diabetes mellitus) with complications (HCC), Dupuytren's contracture, Dyspepsia, Familial tremor, Fatigue, Fibromyalgia syndrome, GERD (gastroesophageal reflux disease), Hashimoto's thyroiditis, Hiatal hernia, History of colon polyps, Hashimoto thyroiditis, Hypercholesteremia, Hypertension, Hypothyroidism, acquired, autoimmune, IBS (irritable bowel syndrome), Meniere's disease of left ear, Migraine without aura and without status migrainosus, not intractable, Multinodular goiter (nontoxic), Nonexudative macular degeneration, Raynaud's syndrome without gangrene, Situational stress, Tachycardia, Thyroiditis, autoimmune, Ulcerative colitis, unspecified, without complications (HCC), Vitamin B 12 deficiency, Vitamin D  deficiency, and Wears glasses.   reports that she quit smoking about 22 years ago. Her smoking use included cigarettes. She started smoking about 64 years ago. She has a 21 pack-year smoking history. She has never used smokeless tobacco.  Past Surgical History:  Procedure Laterality Date   ANAL RECTAL MANOMETRY Bilateral 11/26/2022   Procedure: ANO RECTAL MANOMETRY;  Surgeon: Burnette Fallow, MD;  Location: [facility];  Service: Gastroenterology;  Laterality: Bilateral;   BREAST EXCISIONAL BIOPSY Left    benign   BREAST EXCISIONAL BIOPSY Right    milk gland removed   BREAST LUMPECTOMY Left 1990   benign   BREAST LUMPECTOMY WITH NEEDLE LOCALIZATION Right 02/28/2013   Procedure: RIGHT BREAST NEEDLE LOCALIZATION LUMPECTOMY ;  Surgeon: Morene ONEIDA Olives, MD;  Location: [facility];  Service: General;   Laterality: Right;   CHOLECYSTECTOMY  2008   lapcholi   DILATION AND CURETTAGE OF UTERUS     LEFT HEART CATHETERIZATION WITH CORONARY ANGIOGRAM N/A 01/26/2013   Procedure: LEFT HEART CATHETERIZATION WITH CORONARY ANGIOGRAM;  Surgeon: Oneil Parchment, MD;  Location: [facility];  Service: Cardiovascular;  Laterality: N/A;   PARTIAL HYSTERECTOMY  1971   TONSILLECTOMY      Allergies  Allergen Reactions   Acyclovir And Related    Allegra [Fexofenadine] Hives   Amoxicillin Hives   Codeine Nausea And Vomiting   Elemental Sulfur Hives   Enalapril Maleate Cough   Hydrocodone  Bit-Homatrop Mbr Other (See Comments)   Keflex [Cephalexin] Hives   Levofloxacin Other (See Comments)    insomnia   Lorabid [Loracarbef] Hives   Misc. Sulfonamide Containing Compounds    Other Other (See Comments)   Sulfa Antibiotics Hives    Other reaction(s): hives    Immunization History  Administered Date(s) Administered   Influenza Split 11/30/2007, 01/17/2015, 01/08/2016, 11/27/2017, 12/14/2018   Influenza, High Dose Seasonal PF 12/01/2012, 12/05/2013, 12/12/2016, 11/27/2017, 12/14/2018, 01/01/2021   Influenza-Unspecified 12/18/2011   PFIZER(Purple Top)SARS-COV-2 Vaccination 05/05/2019, 05/26/2019, 12/12/2019   Pneumococcal Conjugate-13 07/18/2015, 10/16/2017   Pneumococcal Polysaccharide-23 10/23/2008, 10/20/2018   Td 07/13/2007   Tdap 07/27/2017   Zoster, Live 05/04/2014    Family History  Problem Relation Age of Onset   Breast cancer Mother 7   Hypertension Mother    Stroke Mother    Heart attack Father    Sudden death Father    Hypertension Sister    Diabetes Sister    Stroke Sister    Breast cancer Paternal Aunt 30 - 69   Ovarian cancer Paternal  Aunt 60 - 69   Thyroid  disease Neg Hx      Current Outpatient Medications:    albuterol  (VENTOLIN  HFA) 108 (90 Base) MCG/ACT inhaler, Inhale 1-2 puffs into the lungs every 6 (six) hours as needed for wheezing or shortness of breath., Disp: 18 g,  Rfl: 6   aspirin  EC 81 MG tablet, Take 1 tablet (81 mg total) by mouth daily., Disp: , Rfl:    azithromycin  (ZITHROMAX ) 250 MG tablet, Take 2 tablets on first day, then 1 tablet daily until finished., Disp: 6 tablet, Rfl: 0   benzonatate  (TESSALON ) 200 MG capsule, Take 1 capsule (200 mg total) by mouth 3 (three) times daily as needed for cough., Disp: 30 capsule, Rfl: 1   Cyanocobalamin  (VITAMIN B 12 PO), Take by mouth daily., Disp: , Rfl:    DULoxetine  (CYMBALTA ) 30 MG capsule, Take 30 mg by mouth 2 (two) times daily., Disp: , Rfl:    fluticasone  (FLONASE ) 50 MCG/ACT nasal spray, Place 1 spray into both nostrils daily., Disp: 16 g, Rfl: 2   Fluticasone -Umeclidin-Vilant (TRELEGY ELLIPTA ) 100-62.5-25 MCG/ACT AEPB, INHALE 1 PUFF BY MOUTH EVERY DAY, Disp: 60 each, Rfl: 11   furosemide (LASIX) 20 MG tablet, Take 20 mg by mouth as needed., Disp: , Rfl:    hydrochlorothiazide (HYDRODIURIL) 25 MG tablet, TAKE 1 TABLET BY MOUTH EVERY DAY IN THE MORNING, Disp: , Rfl: 1   LORazepam  (ATIVAN ) 0.5 MG tablet, TAKE 1/2 TAB EVERY 8 12 HOURS AS NEEDED FOR ANXIOUSNESS, Disp: , Rfl:    losartan (COZAAR) 100 MG tablet, Take 100 mg by mouth daily., Disp: , Rfl: 1   meclizine  (ANTIVERT ) 25 MG tablet, 1/2 to 1 tablet by mouth every 8 hrs as needed for diziness, Disp: , Rfl:    Multiple Vitamin (MULTIVITAMIN) tablet, Take 1 tablet by mouth daily., Disp: , Rfl:    omeprazole (PRILOSEC) 20 MG capsule, Take 20 mg by mouth daily., Disp: , Rfl:    Respiratory Therapy Supplies (FLUTTER) DEVI, Use device 2-3 times a day to break up congestion, Disp: 1 each, Rfl: 0   rosuvastatin  (CRESTOR ) 20 MG tablet, Take 1 tablet (20 mg total) by mouth daily., Disp: 90 tablet, Rfl: 3   SYNTHROID  88 MCG tablet, TAKE DAILY BEFORE BREAKFAST. 1 TABLET 6 DAYS EACH WEEK, BUT TAKE ONLY 1/2 TABLET ON THE 7TH DAY, Disp: 30 tablet, Rfl: 5   VITAMIN D , CHOLECALCIFEROL, PO, Take 50,000 Units by mouth once a week. , Disp: , Rfl:       Objective:    Vitals:   03/09/23 1536  BP: 115/77  Pulse: 80  SpO2: 94%  Weight: 154 lb (69.9 kg)  Height: 5' 4 (1.626 m)    Estimated body mass index is 26.43 kg/m as calculated from the following:   Height as of this encounter: 5' 4 (1.626 m).   Weight as of this encounter: 154 lb (69.9 kg).  @WEIGHTCHANGE @  Filed Weights   03/09/23 1536  Weight: 154 lb (69.9 kg)     Physical Exam   General: No distress. Looks well a bit frail O2 at rest: no Cane present: YES Sitting in wheel chair: no Frail: mild Obese: no Neuro: Alert and Oriented x 3. GCS 15. Speech normal Psych: Pleasant Resp:  Barrel Chest - no.  Wheeze - no, Crackles - no, No overt respiratory distress CVS: Normal heart sounds. Murmurs - no Ext: Stigmata of Connective Tissue Disease - no HEENT: Normal upper airway. PEERL +. No post nasal drip  Assessment:       ICD-10-CM   1. Stage 3 severe COPD by GOLD classification (HCC)  J44.9 CT Chest High Resolution    CBC w/Diff    2. Nocturnal hypoxemia due to emphysema [facility])  J43.9 CT Chest High Resolution   G47.36 CBC w/Diff    3. Exercise hypoxemia  R09.02 CT Chest High Resolution    CBC w/Diff    4. COPD, frequent exacerbations (HCC)  J44.1 CT Chest High Resolution    CBC w/Diff    5. Upper back pain on left side  M54.9 CT Chest High Resolution    CBC w/Diff         Plan:     Patient Instructions     ICD-10-CM   1. Stage 3 severe COPD by GOLD classification (HCC)  J44.9 CT Chest High Resolution    CBC w/Diff    2. Nocturnal hypoxemia due to emphysema [facility])  J43.9 CT Chest High Resolution   G47.36 CBC w/Diff    3. Exercise hypoxemia  R09.02 CT Chest High Resolution    CBC w/Diff    4. COPD, frequent exacerbations (HCC)  J44.1 CT Chest High Resolution    CBC w/Diff    5. Upper back pain on left side  M54.9 CT Chest High Resolution    CBC w/Diff       Stable Gold stage III COPD. In 2024, 2-3 rounds of prednisone  for AECOPD but none  since June 2024. Currrently STABLE COPD but having new onet left infrascapular pain. Also new onet exercise hypoxema  Last CT chest April 2024   Plan  --Continue Trelegy daily [CMA to refill] -Continue albuterol  as needed [CMA to do refill) = Continue oxygen  at night - DME company to test you for best fit exercise oxygen  needs  - do blood work for CBC with diff   - might qualify for dupoxent if blood eosinophils are high - get HRCT supine and prone  FOLLOWUP - APP followup to discuss test    Follow-up -Return in 4-8 weeks with APP to discuss results   FOLLOWUP Return in about 4 weeks (around 04/06/2023) for 15 min visit, with any of the APPS, Face to Face OR Video Visit.    SIGNATURE    Dr. Dorethia Cave, M.D., F.C.C.P,  Pulmonary and Critical Care Medicine Staff Physician, [facility] Director - Interstitial Lung Disease  Program  Pulmonary Fibrosis [facility] at [facility] [place], [place], [address]  Pager: [phone], If no answer or between  15:00h - 7:00h: call [phone] Telephone: [phone]  4:09 PM 03/09/2023

## 2023-03-09 NOTE — Patient Instructions (Addendum)
 ICD-10-CM   1. Stage 3 severe COPD by GOLD classification (HCC)  J44.9 CT Chest High Resolution    CBC w/Diff    2. Nocturnal hypoxemia due to emphysema [facility])  J43.9 CT Chest High Resolution   G47.36 CBC w/Diff    3. Exercise hypoxemia  R09.02 CT Chest High Resolution    CBC w/Diff    4. COPD, frequent exacerbations (HCC)  J44.1 CT Chest High Resolution    CBC w/Diff    5. Upper back pain on left side  M54.9 CT Chest High Resolution    CBC w/Diff       Stable Gold stage III COPD. In 2024, 2-3 rounds of prednisone  for AECOPD but none since June 2024. Currrently STABLE COPD but having new onet left infrascapular pain. Also new onet exercise hypoxema  Last CT chest April 2024   Plan  --Continue Trelegy daily [CMA to refill] -Continue albuterol  as needed [CMA to do refill) = Continue oxygen  at night - DME company to test you for best fit exercise oxygen  needs  - do blood work for CBC with diff   - might qualify for dupixent if blood eosinophils are high - get HRCT supine and prone  FOLLOWUP - APP followup to discuss test    Follow-up -Return in 4-8 weeks with APP to discuss results

## 2023-03-12 ENCOUNTER — Telehealth: Payer: Self-pay | Admitting: Internal Medicine

## 2023-03-12 NOTE — Telephone Encounter (Signed)
Patient needs HRCT scheduled. Please call patient to get scheduled [phone]

## 2023-03-12 NOTE — Telephone Encounter (Signed)
Pt was scheduled and notified

## 2023-03-13 ENCOUNTER — Ambulatory Visit: Payer: Medicare Other | Attending: Gastroenterology

## 2023-03-13 ENCOUNTER — Other Ambulatory Visit: Payer: Self-pay

## 2023-03-13 DIAGNOSIS — R279 Unspecified lack of coordination: Secondary | ICD-10-CM

## 2023-03-13 DIAGNOSIS — N8184 Pelvic muscle wasting: Secondary | ICD-10-CM | POA: Insufficient documentation

## 2023-03-13 DIAGNOSIS — R293 Abnormal posture: Secondary | ICD-10-CM | POA: Diagnosis not present

## 2023-03-13 DIAGNOSIS — M6281 Muscle weakness (generalized): Secondary | ICD-10-CM

## 2023-03-13 NOTE — Patient Instructions (Addendum)
Squatty potty: When your knees are level or below the level of your hips, pelvic floor muscles are pressed against rectum, preventing ease of bowel movement. By getting knees above the level of the hips, these pelvic floor muscles relax, allowing easier passage of bowel movement.  Ways to get knees above hips: o Squatty Potty (7inch and 9inch versions) o Small stool o Roll of toilet paper under each foot o Hardback book or stack of magazines under each foot  Relaxed Toileting mechanics: Once in this position, make sure to lean backwards, wide knees, relaxed stomach, and breathe.    Exhale with pushing when trying to have a bowel movement.     Bowel massage: To assist with more regular and more comfortable bowel movements, try performing bowel massage nightly for 5-10 minutes. Place hands in the lower right side of your abdomen to start; in small circles, massage up, across, and down the left side of your abdomen. Pressure does not need to be hard, but just comfortable. You can use lotion or oil to make more comfortable.    [facility] [address], [address] [place], [place] [address] Phone # [phone] Fax [phone]

## 2023-03-13 NOTE — Therapy (Signed)
OUTPATIENT PHYSICAL THERAPY FEMALE PELVIC EVALUATION   Patient Name: Shelby Lin MRN: [id number] DOB:04/02/1940, 83 y.o., female Today's Date: 03/13/2023  END OF SESSION:  PT End of Session - 03/13/23 1018     Visit Number 1    Date for PT Re-Evaluation 05/08/23    Authorization Type HTA    Progress Note Due on Visit 10    PT Start Time 1015    PT Stop Time 1055    PT Time Calculation (min) 40 min    Activity Tolerance Patient tolerated treatment well    Behavior During Therapy WFL for tasks assessed/performed             Past Medical History:  Diagnosis Date   Anxiety    Arthritis    Asthma in child    Atherosclerotic heart disease of native coronary artery without angina pectoris    Cataracts, bilateral    Combined hyperlipidemia    Constipation, unspecified    COPD (chronic obstructive pulmonary disease) (HCC)    Coronary artery spasm (HCC)    Diabetic neuropathy (HCC)    DM (diabetes mellitus) with complications (HCC)    Dupuytren's contracture    Dyspepsia    Familial tremor    Fatigue    Fibromyalgia syndrome    GERD (gastroesophageal reflux disease)    Hashimoto's thyroiditis    Hiatal hernia    History of colon polyps    Hx of Hashimoto thyroiditis    Hypercholesteremia    Hypertension    Hypothyroidism, acquired, autoimmune    IBS (irritable bowel syndrome)    Meniere's disease of left ear    Migraine without aura and without status migrainosus, not intractable    Multinodular goiter (nontoxic)    Nonexudative macular degeneration    Raynaud's syndrome without gangrene    Situational stress    Tachycardia    Thyroiditis, autoimmune    Ulcerative colitis, unspecified, without complications (HCC)    Vitamin B 12 deficiency    Vitamin D deficiency    Wears glasses    Past Surgical History:  Procedure Laterality Date   ANAL RECTAL MANOMETRY Bilateral 11/26/2022   Procedure: ANO RECTAL MANOMETRY;  Surgeon: Willis Modena, MD;  Location: [facility]  ENDOSCOPY;  Service: Gastroenterology;  Laterality: Bilateral;   BREAST EXCISIONAL BIOPSY Left    benign   BREAST EXCISIONAL BIOPSY Right    milk gland removed   BREAST LUMPECTOMY Left 1990   benign   BREAST LUMPECTOMY WITH NEEDLE LOCALIZATION Right 02/28/2013   Procedure: RIGHT BREAST NEEDLE LOCALIZATION LUMPECTOMY ;  Surgeon: Mariella Saa, MD;  Location: [facility];  Service: General;  Laterality: Right;   CHOLECYSTECTOMY  2008   lapcholi   DILATION AND CURETTAGE OF UTERUS     LEFT HEART CATHETERIZATION WITH CORONARY ANGIOGRAM N/A 01/26/2013   Procedure: LEFT HEART CATHETERIZATION WITH CORONARY ANGIOGRAM;  Surgeon: Donato Schultz, MD;  Location[facility];  Service: Cardiovascular;  Laterality: N/A;   PARTIAL HYSTERECTOMY  1971   TONSILLECTOMY     Patient Active Problem List   Diagnosis Date Noted   COPD with acute exacerbation (HCC) 02/26/2021   URI (upper respiratory infection) 02/26/2021   COVID-19 virus detected 03/25/2019   Physical deconditioning 02/23/2019   Gastroesophageal reflux disease 10/20/2018   Healthcare maintenance 10/20/2018   Bronchiectasis without complication (HCC) 04/22/2018   Bronchiectasis with acute exacerbation (HCC) 04/22/2018   Nocturnal hypoxemia due to emphysema (HCC) 04/22/2018   Prediabetes 04/10/2018   Stage 3  severe COPD by GOLD classification (HCC) 03/01/2018   Overweight 10/13/2016   Ear fullness, left 09/08/2016   Goiter 11/02/2013   Hoarseness 11/02/2013   Action tremor 05/18/2013   Abnormal stress test 01/26/2013   Angina decubitus (HCC) 01/26/2013   Chronic tension headaches 04/06/2012   Chronic night sweats 04/06/2012   Meniere's disease 04/06/2012   Chronic leg pain 04/06/2012   Hearing loss, sensorineural 08/25/2011   Hypothyroidism, acquired, autoimmune    Fibromyalgia syndrome    Thyroiditis, autoimmune    Multinodular goiter (nontoxic)    Tachycardia    Familial tremor    Coronary artery spasm (HCC)     Dyspepsia    Fatigue    Combined hyperlipidemia    Dupuytren's contracture    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 She had constipation start several months ago. She was instructed to increase fluid intake and try stool softeners. Nothing was helpful and now she has to strain very hard in order to have a bowel movement without results most of the time. Yesterday she strained until she bled. She only goes when she is taking a laxative (senokot and dulcolax and colace). Some pain right now that MD is associated with COPD. Fluid intake: Yes: not enough - unsure how much, no caffeine usually    PAIN:  Are you having pain? No  PRECAUTIONS: Other: Stage 3 severe COPD with exercise hypoxemia   RED FLAGS: None   WEIGHT BEARING RESTRICTIONS: No  FALLS:  Has patient fallen in last 6 months? No  LIVING ENVIRONMENT: Lives with: lives with their spouse Lives in: House/apartment  OCCUPATION: retired  PLOF: Independent  PATIENT GOALS: to have a bowel movement like a normal person  PERTINENT HISTORY:  IBS, colitis, partial hysterectomy 1971, cholecystectomy 2008, Severe COPD stage 3 with hypoxemia in exercise   BOWEL MOVEMENT: Pain with bowel movement: Yes Type of bowel movement:Frequency 1x/week, Strain Yes, and Splinting yes  - has had to dig bowel movement out Fully  empty rectum: No Leakage: No Pads: No Fiber  supplement: No - but does get fiber in diet  URINATION: Pain with urination: No Fully empty bladder: No Stream: Weak Urgency: Yes: - Frequency: 3-4x, 1x/night Leakage: Urge to void, Walking to the bathroom, Coughing, Sneezing, and Laughing Pads: Yes: 1 a day, sometimes 1 at night  INTERCOURSE: Not currently sexually active  PREGNANCY: Vaginal deliveries 2 Tearing Yes: first delivery - to rectum C-section deliveries 0 Currently pregnant No  PROLAPSE: none   OBJECTIVE:  Note: Objective measures were completed at Evaluation unless otherwise noted. 03/13/23: DIAGNOSTIC FINDINGS: Anal manometry: able to expel balloon after 40 seconds, decreased sensation at 70cc (normal 10-30cc), some increased sphincter tension with push attempts, decreased squeeze pressure, decreased resting pressure of external anal sphincter   COGNITION: Overall cognitive status: Within functional limits for tasks assessed     SENSATION: Light touch: Appears intact Proprioception: Appears intact   FUNCTIONAL TESTS:  Curl-up test: 2-3 width separation with distortion throughout midline  GAIT: Comments: forward flexed posture  POSTURE: rounded shoulders, forward head, decreased lumbar lordosis, increased thoracic kyphosis, and posterior pelvic tilt  PALPATION:   General  abdominal scar tissue restriction; decreased lateral rib cage expansion with inhale and abdominal drawing in                External Perineal Exam dryness, pale, labial fusion, visualization of posterior vaginal wall                              Internal Pelvic Floor:  Vaginal: no tenderness, atrophy of pelvic floor muscle bil in superficial and deep layers Rectal: no tenderness, decreased resting external anal sphincter tone, some ability to lengthen pelvic floor and build pressure with pushing, largely improved pressure generation with exhaling while pushing  Patient confirms  identification and approves PT to assess internal pelvic floor and treatment Yes  PELVIC MMT:   MMT eval  Vaginal 1/5, 3 second endurance, 4 repeat contractions  Internal Anal Sphincter 1/5  External Anal Sphincter 1/5  Puborectalis 0-1/5  Diastasis Recti 2-3 finger width separation  (Blank rows = not tested)        TONE: Low tone/atrophy  PROLAPSE: Grade 2 posterior vaginal wall laxity, grade 1 anterior vaginal wall laxity  TODAY'S TREATMENT:                                                                                                                              DATE:  03/13/23  EVAL  Neuromuscular re-education: Push training with internal rectal feedback in Lt side lying  Therapeutic activities: Splinting  Squatty potty (leaning back)/relaxed toilet mechanics Self-bowel massage Exhale with pushing during bowel movements     PATIENT EDUCATION:  Education details: See above Person educated: Patient Education method: Explanation, Demonstration, Tactile cues, Verbal cues, and Handouts Education comprehension: verbalized understanding  HOME EXERCISE PROGRAM: Handouts given  ASSESSMENT:  CLINICAL IMPRESSION: Patient is a 83 y.o. female who  was seen today for physical therapy evaluation and treatment for difficulty with bowel movements. Exam findings notable for abdominal scar tissue restriction, abdominal muscle weakness, poor intra-abdominal pressure management, pelvic floor muscle weakness and decreased endurance, difficulty with pelvic floor muscle coordination of contractions, anterior/posterior vaginal wall laxity, very little active contraction present in puborectalis, decreased ability to generate intra-rectal pressure with pushing, decreased external anal sphincter tone, and pelvic floor muscle atrophy. Signs and symptoms are most consistent with posterior vaginal wall weakness, abnormal pressure management including inability to generate appropriate intra-rectal  pressure, and pelvic floor muscle weakness. Initial treatment included improved bowel movement ergonomics, education on appropriate splinting during bowel movements, exhale with pushing for improved intra-rectal pressure, and self-bowel massage. She will continue to benefit from skilled PT intervention in order to increase frequency and ease of bowel movements, achieve complete evacuation, and begin/progress functional strengthening program.    OBJECTIVE IMPAIRMENTS: decreased activity tolerance, decreased coordination, decreased endurance, decreased strength, increased fascial restrictions, increased muscle spasms, impaired tone, postural dysfunction, and pain.   ACTIVITY LIMITATIONS: continence and bowel movements   PARTICIPATION LIMITATIONS:  having successful bowel movements   PERSONAL FACTORS: 1 comorbidity: medical history  are also affecting patient's functional outcome.   REHAB POTENTIAL: Good  CLINICAL DECISION MAKING: Stable/uncomplicated  EVALUATION COMPLEXITY: Low   GOALS: Goals reviewed with patient? Yes  SHORT TERM GOALS: Target date: 04/10/23  Pt will be independent with HEP.   Baseline: Goal status: INITIAL  2.  Pt will be independent with use of squatty potty, relaxed toileting mechanics, and improved bowel movement techniques in order to increase ease of bowel movements and complete evacuation.   Baseline:  Goal status: INITIAL  3.  Pt will be independent with splinting in order to improve ease of bowel movements and complete evacuation.  Baseline:  Goal status: INITIAL  4.  Pt will increase pelvic floor muscle strength to 2/5.  Baseline:  Goal status: INITIAL  5.  Pt will be able to correctly perform diaphragmatic breathing and appropriate pressure management in order to prevent worsening vaginal wall laxity and improve pelvic floor A/ROM.   Baseline:  Goal status: INITIAL   LONG TERM GOALS: Target date: 05/08/23  Pt will be independent with advanced  HEP.   Baseline:  Goal status: INITIAL  2.  Pt will increase pelvic floor muscle strength to 3/5. Baseline:  Goal status: INITIAL  3.  Pt will increase intra-rectal pressure generation in order to successfully have complete bowel movement.  Baseline:  Goal status: INITIAL  4.  Pt will report no leaks with laughing, coughing, sneezing in order to improve comfort with interpersonal relationships and community activities.   Baseline:  Goal status: INITIAL  5.  Pt will report no leaking on her way to the bathroom and be independent with use of urge drill.  Baseline:  Goal status: INITIAL  6.  Pt will report at least 3 bowel movements a week.  Baseline:  Goal status: INITIAL  PLAN:  PT FREQUENCY: 1-2x/week  PT DURATION: 8 weeks  PLANNED INTERVENTIONS: 97110-Therapeutic exercises, 97530- Therapeutic activity, 97112- Neuromuscular re-education, 97535- Self Care, 16109- Manual therapy, Dry Needling, and Biofeedback  PLAN FOR NEXT SESSION: Abdominal scar tissue mobilization; pelvic floor muscle contraction training; push training; begin core training and exercise progressions.    Julio Alm, PT, DPT01/17/2512:06 PM

## 2023-03-17 ENCOUNTER — Ambulatory Visit: Payer: Medicare Other

## 2023-03-17 DIAGNOSIS — N8184 Pelvic muscle wasting: Secondary | ICD-10-CM | POA: Diagnosis not present

## 2023-03-17 DIAGNOSIS — R279 Unspecified lack of coordination: Secondary | ICD-10-CM | POA: Diagnosis not present

## 2023-03-17 DIAGNOSIS — R293 Abnormal posture: Secondary | ICD-10-CM

## 2023-03-17 DIAGNOSIS — M6281 Muscle weakness (generalized): Secondary | ICD-10-CM | POA: Diagnosis not present

## 2023-03-17 NOTE — Therapy (Signed)
OUTPATIENT PHYSICAL THERAPY FEMALE PELVIC TREATMENT   Patient Name: Shelby Lin MRN: [id number] DOB:09/27/1940, 83 y.o., female Today's Date: 03/17/2023  END OF SESSION:  PT End of Session - 03/17/23 0823     Visit Number 2    Date for PT Re-Evaluation 05/08/23    Authorization Type HTA    Progress Note Due on Visit 10    PT Start Time 0823    PT Stop Time 0905    PT Time Calculation (min) 42 min    Activity Tolerance Patient tolerated treatment well    Behavior During Therapy WFL for tasks assessed/performed             Past Medical History:  Diagnosis Date   Anxiety    Arthritis    Asthma in child    Atherosclerotic heart disease of native coronary artery without angina pectoris    Cataracts, bilateral    Combined hyperlipidemia    Constipation, unspecified    COPD (chronic obstructive pulmonary disease) (HCC)    Coronary artery spasm (HCC)    Diabetic neuropathy (HCC)    DM (diabetes mellitus) with complications (HCC)    Dupuytren's contracture    Dyspepsia    Familial tremor    Fatigue    Fibromyalgia syndrome    GERD (gastroesophageal reflux disease)    Hashimoto's thyroiditis    Hiatal hernia    History of colon polyps    Hx of Hashimoto thyroiditis    Hypercholesteremia    Hypertension    Hypothyroidism, acquired, autoimmune    IBS (irritable bowel syndrome)    Meniere's disease of left ear    Migraine without aura and without status migrainosus, not intractable    Multinodular goiter (nontoxic)    Nonexudative macular degeneration    Raynaud's syndrome without gangrene    Situational stress    Tachycardia    Thyroiditis, autoimmune    Ulcerative colitis, unspecified, without complications (HCC)    Vitamin B 12 deficiency    Vitamin D deficiency    Wears glasses    Past Surgical History:  Procedure Laterality Date   ANAL RECTAL MANOMETRY Bilateral 11/26/2022   Procedure: ANO RECTAL MANOMETRY;  Surgeon: Willis Modena, MD;  Location: [facility]  ENDOSCOPY;  Service: Gastroenterology;  Laterality: Bilateral;   BREAST EXCISIONAL BIOPSY Left    benign   BREAST EXCISIONAL BIOPSY Right    milk gland removed   BREAST LUMPECTOMY Left 1990   benign   BREAST LUMPECTOMY WITH NEEDLE LOCALIZATION Right 02/28/2013   Procedure: RIGHT BREAST NEEDLE LOCALIZATION LUMPECTOMY ;  Surgeon: Mariella Saa, MD;  Location: [facility];  Service: General;  Laterality: Right;   CHOLECYSTECTOMY  2008   lapcholi   DILATION AND CURETTAGE OF UTERUS     LEFT HEART CATHETERIZATION WITH CORONARY ANGIOGRAM N/A 01/26/2013   Procedure: LEFT HEART CATHETERIZATION WITH CORONARY ANGIOGRAM;  Surgeon: Donato Schultz, MD;  Location: [facility] CATH LAB;  Service: Cardiovascular;  Laterality: N/A;   PARTIAL HYSTERECTOMY  1971   TONSILLECTOMY     Patient Active Problem List   Diagnosis Date Noted   COPD with acute exacerbation (HCC) 02/26/2021   URI (upper respiratory infection) 02/26/2021   COVID-19 virus detected 03/25/2019   Physical deconditioning 02/23/2019   Gastroesophageal reflux disease 10/20/2018   Healthcare maintenance 10/20/2018   Bronchiectasis without complication (HCC) 04/22/2018   Bronchiectasis with acute exacerbation (HCC) 04/22/2018   Nocturnal hypoxemia due to emphysema (HCC) 04/22/2018   Prediabetes 04/10/2018   Stage 3  severe COPD by GOLD classification (HCC) 03/01/2018   Overweight 10/13/2016   Ear fullness, left 09/08/2016   Goiter 11/02/2013   Hoarseness 11/02/2013   Action tremor 05/18/2013   Abnormal stress test 01/26/2013   Angina decubitus (HCC) 01/26/2013   Chronic tension headaches 04/06/2012   Chronic night sweats 04/06/2012   Meniere's disease 04/06/2012   Chronic leg pain 04/06/2012   Hearing loss, sensorineural 08/25/2011   Hypothyroidism, acquired, autoimmune    Fibromyalgia syndrome    Thyroiditis, autoimmune    Multinodular goiter (nontoxic)    Tachycardia    Familial tremor    Coronary artery spasm (HCC)     Dyspepsia    Fatigue    Combined hyperlipidemia    Dupuytren's contracture    Other disturbances of aromatic amino-acid metabolism 06/18/2010    PCP: Merri Brunette, MD  REFERRING PROVIDER: Willis Modena, MD  REFERRING DIAG: N81.84 (ICD-10-CM) - Pelvic muscle wasting  THERAPY DIAG:  Muscle weakness (generalized)  Unspecified lack of coordination  Abnormal posture  Rationale for Evaluation and Treatment: Rehabilitation  ONSET DATE: 3 months  SUBJECTIVE:                                                                                                                                                                                           SUBJECTIVE STATEMENT: Pt states that she was able to go to the bathroom on her own Sunday and yesterday morning on her own. She has been working on splinting and doing abdominal massage. She is also using a squatty potty.   PAIN:  Are you having pain? No  PRECAUTIONS: Other: Stage 3 severe COPD with exercise hypoxemia   RED FLAGS: None   WEIGHT BEARING RESTRICTIONS: No  FALLS:  Has patient fallen in last 6 months? No  LIVING ENVIRONMENT: Lives with: lives with their spouse Lives in: House/apartment  OCCUPATION: retired  PLOF: Independent  PATIENT GOALS: to have a bowel movement like a normal person  PERTINENT HISTORY:  IBS, colitis, partial hysterectomy 1971, cholecystectomy 2008, Severe COPD stage 3 with hypoxemia in exercise   BOWEL MOVEMENT: Pain with bowel movement: Yes Type of bowel movement:Frequency 1x/week, Strain Yes, and Splinting yes  - has had to dig bowel movement out Fully empty rectum: No Leakage: No Pads: No Fiber supplement: No - but does get fiber in diet  URINATION: Pain with urination: No Fully empty bladder: No Stream: Weak Urgency: Yes: - Frequency: 3-4x, 1x/night Leakage: Urge to void, Walking to the bathroom, Coughing, Sneezing, and Laughing Pads: Yes: 1 a day, sometimes 1 at  night  INTERCOURSE: Not currently sexually active  PREGNANCY: Vaginal deliveries  2 Tearing Yes: first delivery - to rectum C-section deliveries 0 Currently pregnant No  PROLAPSE: none   OBJECTIVE:  Note: Objective measures were completed at Evaluation unless otherwise noted. 03/13/23:  PFIQ-7: 76  DIAGNOSTIC FINDINGS: Anal manometry: able to expel balloon after 40 seconds, decreased sensation at 70cc (normal 10-30cc), some increased sphincter tension with push attempts, decreased squeeze pressure, decreased resting pressure of external anal sphincter   COGNITION: Overall cognitive status: Within functional limits for tasks assessed     SENSATION: Light touch: Appears intact Proprioception: Appears intact   FUNCTIONAL TESTS:  Curl-up test: 2-3 width separation with distortion throughout midline  GAIT: Comments: forward flexed posture  POSTURE: rounded shoulders, forward head, decreased lumbar lordosis, increased thoracic kyphosis, and posterior pelvic tilt  PALPATION:   General  abdominal scar tissue restriction; decreased lateral rib cage expansion with inhale and abdominal drawing in                External Perineal Exam dryness, pale, labial fusion, visualization of posterior vaginal wall                              Internal Pelvic Floor:  Vaginal: no tenderness, atrophy of pelvic floor muscle bil in superficial and deep layers Rectal: no tenderness, decreased resting external anal sphincter tone, some ability to lengthen pelvic floor and build pressure with pushing, largely improved pressure generation with exhaling while pushing  Patient confirms identification and approves PT to assess internal pelvic floor and treatment Yes  PELVIC MMT:   MMT eval  Vaginal 1/5, 3 second endurance, 4 repeat contractions  Internal Anal Sphincter 1/5  External Anal Sphincter 1/5  Puborectalis 0-1/5  Diastasis Recti 2-3 finger width separation  (Blank rows = not  tested)        TONE: Low tone/atrophy  PROLAPSE: Grade 2 posterior vaginal wall laxity, grade 1 anterior vaginal wall laxity  TODAY'S TREATMENT:                                                                                                                              DATE:  03/17/23 Manual: Bowel mobilization supine Neuromuscular re-education: Supine march 2 x 10 Supine hip adduction 2 x 10 Supine clam shell red band 2 x 10 Exercises: Lower trunk rotation 2 x 10 Open books 10x bil Supine piriformis stretch 60 sec bil  03/13/23  EVAL  Neuromuscular re-education: Push training with internal rectal feedback in Lt side lying  Therapeutic activities: Splinting  Squatty potty (leaning back)/relaxed toilet mechanics Self-bowel massage Exhale with pushing during bowel movements     PATIENT EDUCATION:  Education details: See above Person educated: Patient Education method: Programmer, multimedia, Demonstration, Tactile cues, Verbal cues, and Handouts Education comprehension: verbalized understanding  HOME EXERCISE PROGRAM: Handouts given  ASSESSMENT:  CLINICAL IMPRESSION: Pt doing very well overall after first session with 2 bowel movements that she did not have  to force. She was encouraged that this is great progress in just a few days. We performed manual bowel mobilization in session today with good gut sounds and sensation of having to have a bowel movement. She did well with mobility and gentle strengthening exercises. We started to work on breath coordination and pelvic floor muscle contractions with exercises, but she did have some difficulty with this coordination. She will continue to benefit from skilled PT intervention in order to increase frequency and ease of bowel movements, achieve complete evacuation, and begin/progress functional strengthening program.    OBJECTIVE IMPAIRMENTS: decreased activity tolerance, decreased coordination, decreased endurance, decreased strength,  increased fascial restrictions, increased muscle spasms, impaired tone, postural dysfunction, and pain.   ACTIVITY LIMITATIONS: continence and bowel movements   PARTICIPATION LIMITATIONS:  having successful bowel movements   PERSONAL FACTORS: 1 comorbidity: medical history  are also affecting patient's functional outcome.   REHAB POTENTIAL: Good  CLINICAL DECISION MAKING: Stable/uncomplicated  EVALUATION COMPLEXITY: Low   GOALS: Goals reviewed with patient? Yes  SHORT TERM GOALS: Target date: 04/10/23  Pt will be independent with HEP.   Baseline: Goal status: INITIAL  2.  Pt will be independent with use of squatty potty, relaxed toileting mechanics, and improved bowel movement techniques in order to increase ease of bowel movements and complete evacuation.   Baseline:  Goal status: INITIAL  3.  Pt will be independent with splinting in order to improve ease of bowel movements and complete evacuation.  Baseline:  Goal status: INITIAL  4.  Pt will increase pelvic floor muscle strength to 2/5.  Baseline:  Goal status: INITIAL  5.  Pt will be able to correctly perform diaphragmatic breathing and appropriate pressure management in order to prevent worsening vaginal wall laxity and improve pelvic floor A/ROM.   Baseline:  Goal status: INITIAL   LONG TERM GOALS: Target date: 05/08/23  Pt will be independent with advanced HEP.   Baseline:  Goal status: INITIAL  2.  Pt will increase pelvic floor muscle strength to 3/5. Baseline:  Goal status: INITIAL  3.  Pt will increase intra-rectal pressure generation in order to successfully have complete bowel movement.  Baseline:  Goal status: INITIAL  4.  Pt will report no leaks with laughing, coughing, sneezing in order to improve comfort with interpersonal relationships and community activities.   Baseline:  Goal status: INITIAL  5.  Pt will report no leaking on her way to the bathroom and be independent with use of urge  drill.  Baseline:  Goal status: INITIAL  6.  Pt will report at least 3 bowel movements a week.  Baseline:  Goal status: INITIAL  PLAN:  PT FREQUENCY: 1-2x/week  PT DURATION: 8 weeks  PLANNED INTERVENTIONS: 97110-Therapeutic exercises, 97530- Therapeutic activity, 97112- Neuromuscular re-education, 97535- Self Care, [id number]- Manual therapy, Dry Needling, and Biofeedback  PLAN FOR NEXT SESSION: Abdominal scar tissue mobilization; pelvic floor muscle contraction training; push training; begin core training and exercise progressions.    Julio Alm, PT, DPT01/21/259:12 AM

## 2023-03-24 ENCOUNTER — Ambulatory Visit: Payer: Medicare Other

## 2023-03-24 DIAGNOSIS — M6281 Muscle weakness (generalized): Secondary | ICD-10-CM

## 2023-03-24 DIAGNOSIS — R279 Unspecified lack of coordination: Secondary | ICD-10-CM

## 2023-03-24 DIAGNOSIS — R293 Abnormal posture: Secondary | ICD-10-CM

## 2023-03-24 DIAGNOSIS — N8184 Pelvic muscle wasting: Secondary | ICD-10-CM | POA: Diagnosis not present

## 2023-03-24 NOTE — Patient Instructions (Signed)
Bladder/bowel retraining: ? ?8-12 grams of fiber with each meal ?After each meal, go attempt to have a bowel movement within 5 minutes and don?t sit on the toilet for more than 5 minutes ?Drink 4-8oz an hour ? ?For two weeks. ? ?

## 2023-03-24 NOTE — Therapy (Signed)
OUTPATIENT PHYSICAL THERAPY FEMALE PELVIC TREATMENT   Patient Name: Shelby Lin MRN: [id number] DOB:November 15, 1940, 83 y.o., female Today's Date: 03/24/2023  END OF SESSION:  PT End of Session - 03/24/23 1054     Visit Number 3    Date for PT Re-Evaluation 05/08/23    Authorization Type HTA    Progress Note Due on Visit 10    PT Start Time 1100    PT Stop Time 1140    PT Time Calculation (min) 40 min    Activity Tolerance Patient tolerated treatment well    Behavior During Therapy WFL for tasks assessed/performed              Past Medical History:  Diagnosis Date   Anxiety    Arthritis    Asthma in child    Atherosclerotic heart disease of native coronary artery without angina pectoris    Cataracts, bilateral    Combined hyperlipidemia    Constipation, unspecified    COPD (chronic obstructive pulmonary disease) (HCC)    Coronary artery spasm (HCC)    Diabetic neuropathy (HCC)    DM (diabetes mellitus) with complications (HCC)    Dupuytren's contracture    Dyspepsia    Familial tremor    Fatigue    Fibromyalgia syndrome    GERD (gastroesophageal reflux disease)    Hashimoto's thyroiditis    Hiatal hernia    History of colon polyps    Hx of Hashimoto thyroiditis    Hypercholesteremia    Hypertension    Hypothyroidism, acquired, autoimmune    IBS (irritable bowel syndrome)    Meniere's disease of left ear    Migraine without aura and without status migrainosus, not intractable    Multinodular goiter (nontoxic)    Nonexudative macular degeneration    Raynaud's syndrome without gangrene    Situational stress    Tachycardia    Thyroiditis, autoimmune    Ulcerative colitis, unspecified, without complications (HCC)    Vitamin B 12 deficiency    Vitamin D deficiency    Wears glasses    Past Surgical History:  Procedure Laterality Date   ANAL RECTAL MANOMETRY Bilateral 11/26/2022   Procedure: ANO RECTAL MANOMETRY;  Surgeon: Willis Modena, MD;  Location: [facility]  ENDOSCOPY;  Service: Gastroenterology;  Laterality: Bilateral;   BREAST EXCISIONAL BIOPSY Left    benign   BREAST EXCISIONAL BIOPSY Right    milk gland removed   BREAST LUMPECTOMY Left 1990   benign   BREAST LUMPECTOMY WITH NEEDLE LOCALIZATION Right 02/28/2013   Procedure: RIGHT BREAST NEEDLE LOCALIZATION LUMPECTOMY ;  Surgeon: Mariella Saa, MD;  Location: [facility];  Service: General;  Laterality: Right;   CHOLECYSTECTOMY  2008   lapcholi   DILATION AND CURETTAGE OF UTERUS     LEFT HEART CATHETERIZATION WITH CORONARY ANGIOGRAM N/A 01/26/2013   Procedure: LEFT HEART CATHETERIZATION WITH CORONARY ANGIOGRAM;  Surgeon: Donato Schultz, MD;  Location: [facility] CATH LAB;  Service: Cardiovascular;  Laterality: N/A;   PARTIAL HYSTERECTOMY  1971   TONSILLECTOMY     Patient Active Problem List   Diagnosis Date Noted   COPD with acute exacerbation (HCC) 02/26/2021   URI (upper respiratory infection) 02/26/2021   COVID-19 virus detected 03/25/2019   Physical deconditioning 02/23/2019   Gastroesophageal reflux disease 10/20/2018   Healthcare maintenance 10/20/2018   Bronchiectasis without complication (HCC) 04/22/2018   Bronchiectasis with acute exacerbation (HCC) 04/22/2018   Nocturnal hypoxemia due to emphysema (HCC) 04/22/2018   Prediabetes 04/10/2018   Stage  3 severe COPD by GOLD classification (HCC) 03/01/2018   Overweight 10/13/2016   Ear fullness, left 09/08/2016   Goiter 11/02/2013   Hoarseness 11/02/2013   Action tremor 05/18/2013   Abnormal stress test 01/26/2013   Angina decubitus (HCC) 01/26/2013   Chronic tension headaches 04/06/2012   Chronic night sweats 04/06/2012   Meniere's disease 04/06/2012   Chronic leg pain 04/06/2012   Hearing loss, sensorineural 08/25/2011   Hypothyroidism, acquired, autoimmune    Fibromyalgia syndrome    Thyroiditis, autoimmune    Multinodular goiter (nontoxic)    Tachycardia    Familial tremor    Coronary artery spasm (HCC)     Dyspepsia    Fatigue    Combined hyperlipidemia    Dupuytren's contracture    Other disturbances of aromatic amino-acid metabolism 06/18/2010    PCP: Merri Brunette, MD  REFERRING PROVIDER: Willis Modena, MD  REFERRING DIAG: N81.84 (ICD-10-CM) - Pelvic muscle wasting  THERAPY DIAG:  Muscle weakness (generalized)  Unspecified lack of coordination  Abnormal posture  Rationale for Evaluation and Treatment: Rehabilitation  ONSET DATE: 3 months  SUBJECTIVE:                                                                                                                                                                                           SUBJECTIVE STATEMENT: Pt states that she has had 2 bowel movements in the last week, one time she had to use a suppository. She has been very sore from the exercises - she was doing them 2x/day.  PAIN:  Are you having pain? No  PRECAUTIONS: Other: Stage 3 severe COPD with exercise hypoxemia   RED FLAGS: None   WEIGHT BEARING RESTRICTIONS: No  FALLS:  Has patient fallen in last 6 months? No  LIVING ENVIRONMENT: Lives with: lives with their spouse Lives in: House/apartment  OCCUPATION: retired  PLOF: Independent  PATIENT GOALS: to have a bowel movement like a normal person  PERTINENT HISTORY:  IBS, colitis, partial hysterectomy 1971, cholecystectomy 2008, Severe COPD stage 3 with hypoxemia in exercise   BOWEL MOVEMENT: Pain with bowel movement: Yes Type of bowel movement:Frequency 1x/week, Strain Yes, and Splinting yes  - has had to dig bowel movement out Fully empty rectum: No Leakage: No Pads: No Fiber supplement: No - but does get fiber in diet  URINATION: Pain with urination: No Fully empty bladder: No Stream: Weak Urgency: Yes: - Frequency: 3-4x, 1x/night Leakage: Urge to void, Walking to the bathroom, Coughing, Sneezing, and Laughing Pads: Yes: 1 a day, sometimes 1 at night  INTERCOURSE: Not currently  sexually active  PREGNANCY: Vaginal deliveries 2 Tearing Yes:  first delivery - to rectum C-section deliveries 0 Currently pregnant No  PROLAPSE: none   OBJECTIVE:  Note: Objective measures were completed at Evaluation unless otherwise noted. 03/13/23:  PFIQ-7: 76  DIAGNOSTIC FINDINGS: Anal manometry: able to expel balloon after 40 seconds, decreased sensation at 70cc (normal 10-30cc), some increased sphincter tension with push attempts, decreased squeeze pressure, decreased resting pressure of external anal sphincter   COGNITION: Overall cognitive status: Within functional limits for tasks assessed     SENSATION: Light touch: Appears intact Proprioception: Appears intact   FUNCTIONAL TESTS:  Curl-up test: 2-3 width separation with distortion throughout midline  GAIT: Comments: forward flexed posture  POSTURE: rounded shoulders, forward head, decreased lumbar lordosis, increased thoracic kyphosis, and posterior pelvic tilt  PALPATION:   General  abdominal scar tissue restriction; decreased lateral rib cage expansion with inhale and abdominal drawing in                External Perineal Exam dryness, pale, labial fusion, visualization of posterior vaginal wall                              Internal Pelvic Floor:  Vaginal: no tenderness, atrophy of pelvic floor muscle bil in superficial and deep layers Rectal: no tenderness, decreased resting external anal sphincter tone, some ability to lengthen pelvic floor and build pressure with pushing, largely improved pressure generation with exhaling while pushing  Patient confirms identification and approves PT to assess internal pelvic floor and treatment Yes  PELVIC MMT:   MMT eval  Vaginal 1/5, 3 second endurance, 4 repeat contractions  Internal Anal Sphincter 1/5  External Anal Sphincter 1/5  Puborectalis 0-1/5  Diastasis Recti 2-3 finger width separation  (Blank rows = not tested)        TONE: Low  tone/atrophy  PROLAPSE: Grade 2 posterior vaginal wall laxity, grade 1 anterior vaginal wall laxity  TODAY'S TREATMENT:                                                                                                                              DATE:  03/24/23 Manual: Bowel mobilization  Neuromuscular re-education: Pt provides verbal consent for internal vaginal/rectal pelvic floor exam. Vaginal pelvic floor muscle contraction training Rectal push training and pelvic floor muscle contraction training Therapeutic activities: Strict bowel training   03/17/23 Manual: Bowel mobilization supine Neuromuscular re-education: Supine march 2 x 10 Supine hip adduction 2 x 10 Supine clam shell red band 2 x 10 Exercises: Lower trunk rotation 2 x 10 Open books 10x bil Supine piriformis stretch 60 sec bil  03/13/23  EVAL  Neuromuscular re-education: Push training with internal rectal feedback in Lt side lying  Therapeutic activities: Splinting  Squatty potty (leaning back)/relaxed toilet mechanics Self-bowel massage Exhale with pushing during bowel movements     PATIENT EDUCATION:  Education details: See above Person educated: Patient Education method: Explanation, Demonstration, Tactile cues,  Verbal cues, and Handouts Education comprehension: verbalized understanding  HOME EXERCISE PROGRAM: [id number]   ASSESSMENT:  CLINICAL IMPRESSION: Pt did have two bowel movements again this last week, but feels like she is not making much progress yet. We worked on internal vaginal and rectal pelvic floor muscle contraction training and push training in order to help improve proprioception and coordination of these muscles. She had inconsistent ability to build appropriate rectal pressure with push training today; however, cue of fogging up mirror was very helpful. She was also able to improve pelvic floor muscle strength with breath coordination, but she did fatigue quickly in a set of 10  repetitions. With fatigue, muscles would attempt bearing down before contracting, but not fully and she had good awareness to correct. Hep progressed to include pelvic floor muscle contractions. Bowel mobilization also performed to help stimulate colon motility and decrease abdominal adhesions.  She will continue to benefit from skilled PT intervention in order to increase frequency and ease of bowel movements, achieve complete evacuation, and begin/progress functional strengthening program.    OBJECTIVE IMPAIRMENTS: decreased activity tolerance, decreased coordination, decreased endurance, decreased strength, increased fascial restrictions, increased muscle spasms, impaired tone, postural dysfunction, and pain.   ACTIVITY LIMITATIONS: continence and bowel movements   PARTICIPATION LIMITATIONS:  having successful bowel movements   PERSONAL FACTORS: 1 comorbidity: medical history  are also affecting patient's functional outcome.   REHAB POTENTIAL: Good  CLINICAL DECISION MAKING: Stable/uncomplicated  EVALUATION COMPLEXITY: Low   GOALS: Goals reviewed with patient? Yes  SHORT TERM GOALS: Target date: 04/10/23  Pt will be independent with HEP.   Baseline: Goal status: INITIAL  2.  Pt will be independent with use of squatty potty, relaxed toileting mechanics, and improved bowel movement techniques in order to increase ease of bowel movements and complete evacuation.   Baseline:  Goal status: INITIAL  3.  Pt will be independent with splinting in order to improve ease of bowel movements and complete evacuation.  Baseline:  Goal status: INITIAL  4.  Pt will increase pelvic floor muscle strength to 2/5.  Baseline:  Goal status: INITIAL  5.  Pt will be able to correctly perform diaphragmatic breathing and appropriate pressure management in order to prevent worsening vaginal wall laxity and improve pelvic floor A/ROM.   Baseline:  Goal status: INITIAL   LONG TERM GOALS: Target  date: 05/08/23  Pt will be independent with advanced HEP.   Baseline:  Goal status: INITIAL  2.  Pt will increase pelvic floor muscle strength to 3/5. Baseline:  Goal status: INITIAL  3.  Pt will increase intra-rectal pressure generation in order to successfully have complete bowel movement.  Baseline:  Goal status: INITIAL  4.  Pt will report no leaks with laughing, coughing, sneezing in order to improve comfort with interpersonal relationships and community activities.   Baseline:  Goal status: INITIAL  5.  Pt will report no leaking on her way to the bathroom and be independent with use of urge drill.  Baseline:  Goal status: INITIAL  6.  Pt will report at least 3 bowel movements a week.  Baseline:  Goal status: INITIAL  PLAN:  PT FREQUENCY: 1-2x/week  PT DURATION: 8 weeks  PLANNED INTERVENTIONS: 97110-Therapeutic exercises, 97530- Therapeutic activity, 97112- Neuromuscular re-education, 97535- Self Care, [id number]- Manual therapy, Dry Needling, and Biofeedback  PLAN FOR NEXT SESSION: Abdominal scar tissue mobilization; pelvic floor muscle contraction training; push training; begin core training and exercise progressions.    Julio Alm, PT, DPT01/28/2510:55  AM

## 2023-03-30 ENCOUNTER — Ambulatory Visit: Payer: Medicare Other | Attending: Gastroenterology

## 2023-03-30 ENCOUNTER — Ambulatory Visit
Admission: RE | Admit: 2023-03-30 | Discharge: 2023-03-30 | Disposition: A | Discharge status: Home / Self Care | Payer: Medicare Other | Source: Ambulatory Visit | Attending: Internal Medicine | Admitting: Internal Medicine

## 2023-03-30 DIAGNOSIS — R0781 Pleurodynia: Secondary | ICD-10-CM | POA: Diagnosis not present

## 2023-03-30 DIAGNOSIS — M6281 Muscle weakness (generalized): Secondary | ICD-10-CM | POA: Insufficient documentation

## 2023-03-30 DIAGNOSIS — R279 Unspecified lack of coordination: Secondary | ICD-10-CM | POA: Insufficient documentation

## 2023-03-30 DIAGNOSIS — J441 Chronic obstructive pulmonary disease with (acute) exacerbation: Secondary | ICD-10-CM

## 2023-03-30 DIAGNOSIS — I700 Atherosclerosis of aorta: Secondary | ICD-10-CM | POA: Diagnosis not present

## 2023-03-30 DIAGNOSIS — R293 Abnormal posture: Secondary | ICD-10-CM | POA: Diagnosis not present

## 2023-03-30 DIAGNOSIS — J449 Chronic obstructive pulmonary disease, unspecified: Secondary | ICD-10-CM

## 2023-03-30 DIAGNOSIS — J439 Emphysema, unspecified: Secondary | ICD-10-CM | POA: Diagnosis not present

## 2023-03-30 DIAGNOSIS — R0609 Other forms of dyspnea: Secondary | ICD-10-CM | POA: Diagnosis not present

## 2023-03-30 DIAGNOSIS — M549 Dorsalgia, unspecified: Secondary | ICD-10-CM

## 2023-03-30 DIAGNOSIS — R0902 Hypoxemia: Secondary | ICD-10-CM

## 2023-03-30 NOTE — Therapy (Signed)
OUTPATIENT PHYSICAL THERAPY FEMALE PELVIC TREATMENT   Patient Name: Shelby Lin MRN: [id number] DOB:1940/07/13, 83 y.o., female Today's Date: 03/30/2023  END OF SESSION:  PT End of Session - 03/30/23 1230     Visit Number 4    Date for PT Re-Evaluation 05/08/23    Authorization Type HTA    Progress Note Due on Visit 10    PT Start Time 1230    PT Stop Time 1310    PT Time Calculation (min) 40 min    Activity Tolerance Patient tolerated treatment well    Behavior During Therapy WFL for tasks assessed/performed              Past Medical History:  Diagnosis Date   Anxiety    Arthritis    Asthma in child    Atherosclerotic heart disease of native coronary artery without angina pectoris    Cataracts, bilateral    Combined hyperlipidemia    Constipation, unspecified    COPD (chronic obstructive pulmonary disease) (HCC)    Coronary artery spasm (HCC)    Diabetic neuropathy (HCC)    DM (diabetes mellitus) with complications (HCC)    Dupuytren's contracture    Dyspepsia    Familial tremor    Fatigue    Fibromyalgia syndrome    GERD (gastroesophageal reflux disease)    Hashimoto's thyroiditis    Hiatal hernia    History of colon polyps    Hx of Hashimoto thyroiditis    Hypercholesteremia    Hypertension    Hypothyroidism, acquired, autoimmune    IBS (irritable bowel syndrome)    Meniere's disease of left ear    Migraine without aura and without status migrainosus, not intractable    Multinodular goiter (nontoxic)    Nonexudative macular degeneration    Raynaud's syndrome without gangrene    Situational stress    Tachycardia    Thyroiditis, autoimmune    Ulcerative colitis, unspecified, without complications (HCC)    Vitamin B 12 deficiency    Vitamin D deficiency    Wears glasses    Past Surgical History:  Procedure Laterality Date   ANAL RECTAL MANOMETRY Bilateral 11/26/2022   Procedure: ANO RECTAL MANOMETRY;  Surgeon: Willis Modena, MD;  Location: [facility]  ENDOSCOPY;  Service: Gastroenterology;  Laterality: Bilateral;   BREAST EXCISIONAL BIOPSY Left    benign   BREAST EXCISIONAL BIOPSY Right    milk gland removed   BREAST LUMPECTOMY Left 1990   benign   BREAST LUMPECTOMY WITH NEEDLE LOCALIZATION Right 02/28/2013   Procedure: RIGHT BREAST NEEDLE LOCALIZATION LUMPECTOMY ;  Surgeon: Mariella Saa, MD;  Location: [facility];  Service: General;  Laterality: Right;   CHOLECYSTECTOMY  2008   lapcholi   DILATION AND CURETTAGE OF UTERUS     LEFT HEART CATHETERIZATION WITH CORONARY ANGIOGRAM N/A 01/26/2013   Procedure: LEFT HEART CATHETERIZATION WITH CORONARY ANGIOGRAM;  Surgeon: Donato Schultz, MD;  Location: [facility] CATH LAB;  Service: Cardiovascular;  Laterality: N/A;   PARTIAL HYSTERECTOMY  1971   TONSILLECTOMY     Patient Active Problem List   Diagnosis Date Noted   COPD with acute exacerbation (HCC) 02/26/2021   URI (upper respiratory infection) 02/26/2021   COVID-19 virus detected 03/25/2019   Physical deconditioning 02/23/2019   Gastroesophageal reflux disease 10/20/2018   Healthcare maintenance 10/20/2018   Bronchiectasis without complication (HCC) 04/22/2018   Bronchiectasis with acute exacerbation (HCC) 04/22/2018   Nocturnal hypoxemia due to emphysema (HCC) 04/22/2018   Prediabetes 04/10/2018   Stage  3 severe COPD by GOLD classification (HCC) 03/01/2018   Overweight 10/13/2016   Ear fullness, left 09/08/2016   Goiter 11/02/2013   Hoarseness 11/02/2013   Action tremor 05/18/2013   Abnormal stress test 01/26/2013   Angina decubitus (HCC) 01/26/2013   Chronic tension headaches 04/06/2012   Chronic night sweats 04/06/2012   Meniere's disease 04/06/2012   Chronic leg pain 04/06/2012   Hearing loss, sensorineural 08/25/2011   Hypothyroidism, acquired, autoimmune    Fibromyalgia syndrome    Thyroiditis, autoimmune    Multinodular goiter (nontoxic)    Tachycardia    Familial tremor    Coronary artery spasm (HCC)     Dyspepsia    Fatigue    Combined hyperlipidemia    Dupuytren's contracture    Other disturbances of aromatic amino-acid metabolism 06/18/2010    PCP: Merri Brunette, MD  REFERRING PROVIDER: Willis Modena, MD  REFERRING DIAG: N81.84 (ICD-10-CM) - Pelvic muscle wasting  THERAPY DIAG:  Muscle weakness (generalized)  Unspecified lack of coordination  Abnormal posture  Rationale for Evaluation and Treatment: Rehabilitation  ONSET DATE: 3 months  SUBJECTIVE:                                                                                                                                                                                           SUBJECTIVE STATEMENT: Pt states that she hasn't felt very well and has not been able to eat much this week, so it's been hard to work on the bowel retraining. However, she is working on incorporating more fiber into diet and has had 3 bowel movements in the last week.   PAIN:  Are you having pain? No  PRECAUTIONS: Other: Stage 3 severe COPD with exercise hypoxemia   RED FLAGS: None   WEIGHT BEARING RESTRICTIONS: No  FALLS:  Has patient fallen in last 6 months? No  LIVING ENVIRONMENT: Lives with: lives with their spouse Lives in: House/apartment  OCCUPATION: retired  PLOF: Independent  PATIENT GOALS: to have a bowel movement like a normal person  PERTINENT HISTORY:  IBS, colitis, partial hysterectomy 1971, cholecystectomy 2008, Severe COPD stage 3 with hypoxemia in exercise   BOWEL MOVEMENT: Pain with bowel movement: Yes Type of bowel movement:Frequency 1x/week, Strain Yes, and Splinting yes  - has had to dig bowel movement out Fully empty rectum: No Leakage: No Pads: No Fiber supplement: No - but does get fiber in diet  URINATION: Pain with urination: No Fully empty bladder: No Stream: Weak Urgency: Yes: - Frequency: 3-4x, 1x/night Leakage: Urge to void, Walking to the bathroom, Coughing, Sneezing, and  Laughing Pads: Yes: 1 a day, sometimes 1 at  night  INTERCOURSE: Not currently sexually active  PREGNANCY: Vaginal deliveries 2 Tearing Yes: first delivery - to rectum C-section deliveries 0 Currently pregnant No  PROLAPSE: none   OBJECTIVE:  Note: Objective measures were completed at Evaluation unless otherwise noted. 03/13/23:  PFIQ-7: 76  DIAGNOSTIC FINDINGS: Anal manometry: able to expel balloon after 40 seconds, decreased sensation at 70cc (normal 10-30cc), some increased sphincter tension with push attempts, decreased squeeze pressure, decreased resting pressure of external anal sphincter   COGNITION: Overall cognitive status: Within functional limits for tasks assessed     SENSATION: Light touch: Appears intact Proprioception: Appears intact   FUNCTIONAL TESTS:  Curl-up test: 2-3 width separation with distortion throughout midline  GAIT: Comments: forward flexed posture  POSTURE: rounded shoulders, forward head, decreased lumbar lordosis, increased thoracic kyphosis, and posterior pelvic tilt  PALPATION:   General  abdominal scar tissue restriction; decreased lateral rib cage expansion with inhale and abdominal drawing in                External Perineal Exam dryness, pale, labial fusion, visualization of posterior vaginal wall                              Internal Pelvic Floor:  Vaginal: no tenderness, atrophy of pelvic floor muscle bil in superficial and deep layers Rectal: no tenderness, decreased resting external anal sphincter tone, some ability to lengthen pelvic floor and build pressure with pushing, largely improved pressure generation with exhaling while pushing  Patient confirms identification and approves PT to assess internal pelvic floor and treatment Yes  PELVIC MMT:   MMT eval  Vaginal 1/5, 3 second endurance, 4 repeat contractions  Internal Anal Sphincter 1/5  External Anal Sphincter 1/5  Puborectalis 0-1/5  Diastasis Recti 2-3 finger  width separation  (Blank rows = not tested)        TONE: Low tone/atrophy  PROLAPSE: Grade 2 posterior vaginal wall laxity, grade 1 anterior vaginal wall laxity  TODAY'S TREATMENT:                                                                                                                              DATE:  03/30/23 Manual: Bowel mobilization  Neuromuscular re-education: Seated hip adduction ball press with transversus abdominus and pelvic floor muscle 2 x 10 Bridge with hip adduction, transversus abdominus, and pelvic floor muscle 2 x 10  Supine hip abduction red band with transversus abdominus and pelvic floor muscle 2 x 10 Supine march with red band with transversus abdominus and pelvic floor muscle 2 x 10 Exercises: Lower trunk rotation 2 x 10 Single knee to chest 10x bil Bent knee fall out 10x bil  03/24/23 Manual: Bowel mobilization  Neuromuscular re-education: Pt provides verbal consent for internal vaginal/rectal pelvic floor exam. Vaginal pelvic floor muscle contraction training Rectal push training and pelvic floor muscle contraction training Therapeutic activities: Strict bowel training  03/17/23 Manual: Bowel mobilization supine Neuromuscular re-education: Supine march 2 x 10 Supine hip adduction 2 x 10 Supine clam shell red band 2 x 10 Exercises: Lower trunk rotation 2 x 10 Open books 10x bil Supine piriformis stretch 60 sec bil    PATIENT EDUCATION:  Education details: See above Person educated: Patient Education method: Programmer, multimedia, Demonstration, Tactile cues, Verbal cues, and Handouts Education comprehension: verbalized understanding  HOME EXERCISE PROGRAM: [id number]   ASSESSMENT:  CLINICAL IMPRESSION: Believe pt is starting to see progress since she has improved now to having 3 bowel movements in the last week compared to 1 a week when she initially came. Due to not feeling very well today, we did not progress many exercises, but  reviewed some of her previous progressions and revisited bowel retraining protocol so she can feel confident performing when she feels able to. She tolerated all exercises very well.  She will continue to benefit from skilled PT intervention in order to increase frequency and ease of bowel movements, achieve complete evacuation, and begin/progress functional strengthening program.    OBJECTIVE IMPAIRMENTS: decreased activity tolerance, decreased coordination, decreased endurance, decreased strength, increased fascial restrictions, increased muscle spasms, impaired tone, postural dysfunction, and pain.   ACTIVITY LIMITATIONS: continence and bowel movements   PARTICIPATION LIMITATIONS:  having successful bowel movements   PERSONAL FACTORS: 1 comorbidity: medical history  are also affecting patient's functional outcome.   REHAB POTENTIAL: Good  CLINICAL DECISION MAKING: Stable/uncomplicated  EVALUATION COMPLEXITY: Low   GOALS: Goals reviewed with patient? Yes  SHORT TERM GOALS: Target date: 04/10/23  Pt will be independent with HEP.   Baseline: Goal status: INITIAL  2.  Pt will be independent with use of squatty potty, relaxed toileting mechanics, and improved bowel movement techniques in order to increase ease of bowel movements and complete evacuation.   Baseline:  Goal status: INITIAL  3.  Pt will be independent with splinting in order to improve ease of bowel movements and complete evacuation.  Baseline:  Goal status: INITIAL  4.  Pt will increase pelvic floor muscle strength to 2/5.  Baseline:  Goal status: INITIAL  5.  Pt will be able to correctly perform diaphragmatic breathing and appropriate pressure management in order to prevent worsening vaginal wall laxity and improve pelvic floor A/ROM.   Baseline:  Goal status: INITIAL   LONG TERM GOALS: Target date: 05/08/23  Pt will be independent with advanced HEP.   Baseline:  Goal status: INITIAL  2.  Pt will  increase pelvic floor muscle strength to 3/5. Baseline:  Goal status: INITIAL  3.  Pt will increase intra-rectal pressure generation in order to successfully have complete bowel movement.  Baseline:  Goal status: INITIAL  4.  Pt will report no leaks with laughing, coughing, sneezing in order to improve comfort with interpersonal relationships and community activities.   Baseline:  Goal status: INITIAL  5.  Pt will report no leaking on her way to the bathroom and be independent with use of urge drill.  Baseline:  Goal status: INITIAL  6.  Pt will report at least 3 bowel movements a week.  Baseline:  Goal status: INITIAL  PLAN:  PT FREQUENCY: 1-2x/week  PT DURATION: 8 weeks  PLANNED INTERVENTIONS: 97110-Therapeutic exercises, 97530- Therapeutic activity, 97112- Neuromuscular re-education, 97535- Self Care, [id number]- Manual therapy, Dry Needling, and Biofeedback  PLAN FOR NEXT SESSION: Abdominal scar tissue mobilization; pelvic floor muscle contraction training; push training; begin core training and exercise progressions.    Julio Alm,  PT, DPT02/03/251:13 PM

## 2023-03-31 ENCOUNTER — Ambulatory Visit: Payer: Medicare Other

## 2023-04-02 ENCOUNTER — Telehealth: Payer: Self-pay | Admitting: Internal Medicine

## 2023-04-02 ENCOUNTER — Other Ambulatory Visit: Payer: Medicare Other

## 2023-04-02 NOTE — Telephone Encounter (Signed)
 Spoke to patient. She is requesting CT results. She is aware that CT has not been read by radiology at this time. Advised her of backlog.  She voiced her understanding.  Nothing further needed at this time.

## 2023-04-02 NOTE — Telephone Encounter (Signed)
 Patietn had a Ct scan on Monday February the 3rd and would like to know her results. I advised her that it may take two weeks. She can be reached at [phone]

## 2023-04-07 ENCOUNTER — Ambulatory Visit: Payer: Medicare Other

## 2023-04-07 DIAGNOSIS — R279 Unspecified lack of coordination: Secondary | ICD-10-CM | POA: Diagnosis not present

## 2023-04-07 DIAGNOSIS — R293 Abnormal posture: Secondary | ICD-10-CM

## 2023-04-07 DIAGNOSIS — M6281 Muscle weakness (generalized): Secondary | ICD-10-CM

## 2023-04-07 NOTE — Therapy (Signed)
OUTPATIENT PHYSICAL THERAPY FEMALE PELVIC TREATMENT   Patient Name: Shelby Lin MRN: [id number] DOB:03-Mar-1940, 83 y.o., female Today's Date: 04/07/2023  END OF SESSION:  PT End of Session - 04/07/23 0841     Visit Number 5    Date for PT Re-Evaluation 05/08/23    Authorization Type HTA    Progress Note Due on Visit 10    PT Start Time 0844    PT Stop Time 0907    PT Time Calculation (min) 23 min    Activity Tolerance Patient tolerated treatment well    Behavior During Therapy WFL for tasks assessed/performed              Past Medical History:  Diagnosis Date   Anxiety    Arthritis    Asthma in child    Atherosclerotic heart disease of native coronary artery without angina pectoris    Cataracts, bilateral    Combined hyperlipidemia    Constipation, unspecified    COPD (chronic obstructive pulmonary disease) (HCC)    Coronary artery spasm (HCC)    Diabetic neuropathy (HCC)    DM (diabetes mellitus) with complications (HCC)    Dupuytren's contracture    Dyspepsia    Familial tremor    Fatigue    Fibromyalgia syndrome    GERD (gastroesophageal reflux disease)    Hashimoto's thyroiditis    Hiatal hernia    History of colon polyps    Hx of Hashimoto thyroiditis    Hypercholesteremia    Hypertension    Hypothyroidism, acquired, autoimmune    IBS (irritable bowel syndrome)    Meniere's disease of left ear    Migraine without aura and without status migrainosus, not intractable    Multinodular goiter (nontoxic)    Nonexudative macular degeneration    Raynaud's syndrome without gangrene    Situational stress    Tachycardia    Thyroiditis, autoimmune    Ulcerative colitis, unspecified, without complications (HCC)    Vitamin B 12 deficiency    Vitamin D deficiency    Wears glasses    Past Surgical History:  Procedure Laterality Date   ANAL RECTAL MANOMETRY Bilateral 11/26/2022   Procedure: ANO RECTAL MANOMETRY;  Surgeon: Willis Modena, MD;  Location: [facility];  Service: Gastroenterology;  Laterality: Bilateral;   BREAST EXCISIONAL BIOPSY Left    benign   BREAST EXCISIONAL BIOPSY Right    milk gland removed   BREAST LUMPECTOMY Left 1990   benign   BREAST LUMPECTOMY WITH NEEDLE LOCALIZATION Right 02/28/2013   Procedure: RIGHT BREAST NEEDLE LOCALIZATION LUMPECTOMY ;  Surgeon: Mariella Saa, MD;  Location: [facility];  Service: General;  Laterality: Right;   CHOLECYSTECTOMY  2008   lapcholi   DILATION AND CURETTAGE OF UTERUS     LEFT HEART CATHETERIZATION WITH CORONARY ANGIOGRAM N/A 01/26/2013   Procedure: LEFT HEART CATHETERIZATION WITH CORONARY ANGIOGRAM;  Surgeon: Donato Schultz, MD;  Location: [facility];  Service: Cardiovascular;  Laterality: N/A;   PARTIAL HYSTERECTOMY  1971   TONSILLECTOMY     Patient Active Problem List   Diagnosis Date Noted   COPD with acute exacerbation (HCC) 02/26/2021   URI (upper respiratory infection) 02/26/2021   COVID-19 virus detected 03/25/2019   Physical deconditioning 02/23/2019   Gastroesophageal reflux disease 10/20/2018   Healthcare maintenance 10/20/2018   Bronchiectasis without complication (HCC) 04/22/2018   Bronchiectasis with acute exacerbation (HCC) 04/22/2018   Nocturnal hypoxemia due to emphysema (HCC) 04/22/2018   Prediabetes 04/10/2018   Stage  3 severe COPD by GOLD classification (HCC) 03/01/2018   Overweight 10/13/2016   Ear fullness, left 09/08/2016   Goiter 11/02/2013   Hoarseness 11/02/2013   Action tremor 05/18/2013   Abnormal stress test 01/26/2013   Angina decubitus (HCC) 01/26/2013   Chronic tension headaches 04/06/2012   Chronic night sweats 04/06/2012   Meniere's disease 04/06/2012   Chronic leg pain 04/06/2012   Hearing loss, sensorineural 08/25/2011   Hypothyroidism, acquired, autoimmune    Fibromyalgia syndrome    Thyroiditis, autoimmune    Multinodular goiter (nontoxic)    Tachycardia    Familial tremor    Coronary artery spasm (HCC)     Dyspepsia    Fatigue    Combined hyperlipidemia    Dupuytren's contracture    Other disturbances of aromatic amino-acid metabolism 06/18/2010    PCP: Merri Brunette, MD  REFERRING PROVIDER: Willis Modena, MD  REFERRING DIAG: N81.84 (ICD-10-CM) - Pelvic muscle wasting  THERAPY DIAG:  Muscle weakness (generalized)  Unspecified lack of coordination  Abnormal posture  Rationale for Evaluation and Treatment: Rehabilitation  ONSET DATE: 3 months  SUBJECTIVE:                                                                                                                                                                                           SUBJECTIVE STATEMENT: Pt states that she is having significant abdominal discomfort and has had frequent diarrhea over the last week. She reports "dark as tar" bowel movement last week. She has been having more Lt thoracic pain; no results from CT scan yet.  PAIN:  Are you having pain? No  PRECAUTIONS: Other: Stage 3 severe COPD with exercise hypoxemia   RED FLAGS: None   WEIGHT BEARING RESTRICTIONS: No  FALLS:  Has patient fallen in last 6 months? No  LIVING ENVIRONMENT: Lives with: lives with their spouse Lives in: House/apartment  OCCUPATION: retired  PLOF: Independent  PATIENT GOALS: to have a bowel movement like a normal person  PERTINENT HISTORY:  IBS, colitis, partial hysterectomy 1971, cholecystectomy 2008, Severe COPD stage 3 with hypoxemia in exercise   BOWEL MOVEMENT: Pain with bowel movement: Yes Type of bowel movement:Frequency 1x/week, Strain Yes, and Splinting yes  - has had to dig bowel movement out Fully empty rectum: No Leakage: No Pads: No Fiber supplement: No - but does get fiber in diet  URINATION: Pain with urination: No Fully empty bladder: No Stream: Weak Urgency: Yes: - Frequency: 3-4x, 1x/night Leakage: Urge to void, Walking to the bathroom, Coughing, Sneezing, and Laughing Pads: Yes:  1 a day, sometimes 1 at night  INTERCOURSE: Not currently sexually active  PREGNANCY: Vaginal deliveries 2 Tearing Yes: first delivery - to rectum C-section deliveries 0 Currently pregnant No  PROLAPSE: none   OBJECTIVE:  Note: Objective measures were completed at Evaluation unless otherwise noted. 03/13/23:  PFIQ-7: 76  DIAGNOSTIC FINDINGS: Anal manometry: able to expel balloon after 40 seconds, decreased sensation at 70cc (normal 10-30cc), some increased sphincter tension with push attempts, decreased squeeze pressure, decreased resting pressure of external anal sphincter   COGNITION: Overall cognitive status: Within functional limits for tasks assessed     SENSATION: Light touch: Appears intact Proprioception: Appears intact   FUNCTIONAL TESTS:  Curl-up test: 2-3 width separation with distortion throughout midline  GAIT: Comments: forward flexed posture  POSTURE: rounded shoulders, forward head, decreased lumbar lordosis, increased thoracic kyphosis, and posterior pelvic tilt  PALPATION:   General  abdominal scar tissue restriction; decreased lateral rib cage expansion with inhale and abdominal drawing in                External Perineal Exam dryness, pale, labial fusion, visualization of posterior vaginal wall                              Internal Pelvic Floor:  Vaginal: no tenderness, atrophy of pelvic floor muscle bil in superficial and deep layers Rectal: no tenderness, decreased resting external anal sphincter tone, some ability to lengthen pelvic floor and build pressure with pushing, largely improved pressure generation with exhaling while pushing  Patient confirms identification and approves PT to assess internal pelvic floor and treatment Yes  PELVIC MMT:   MMT eval  Vaginal 1/5, 3 second endurance, 4 repeat contractions  Internal Anal Sphincter 1/5  External Anal Sphincter 1/5  Puborectalis 0-1/5  Diastasis Recti 2-3 finger width separation   (Blank rows = not tested)        TONE: Low tone/atrophy  PROLAPSE: Grade 2 posterior vaginal wall laxity, grade 1 anterior vaginal wall laxity  TODAY'S TREATMENT:                                                                                                                              DATE:  04/07/23 Manual: Sitting soft tissue mobilization to Lt thoracic paraspinals Therapeutic activities: Self-massage with tennis ball Contacting MD about recent abdominal pain and diarrhea/bowel movements Seated postural correction exercises and impact on thoracic pain: Scapular retraction 10x Posterior shoulder rolls 2 x 10   03/30/23 Manual: Bowel mobilization  Neuromuscular re-education: Seated hip adduction ball press with transversus abdominus and pelvic floor muscle 2 x 10 Bridge with hip adduction, transversus abdominus, and pelvic floor muscle 2 x 10  Supine hip abduction red band with transversus abdominus and pelvic floor muscle 2 x 10 Supine march with red band with transversus abdominus and pelvic floor muscle 2 x 10 Exercises: Lower trunk rotation 2 x 10 Single knee to chest 10x bil Bent knee fall out 10x bil  03/24/23  Manual: Bowel mobilization  Neuromuscular re-education: Pt provides verbal consent for internal vaginal/rectal pelvic floor exam. Vaginal pelvic floor muscle contraction training Rectal push training and pelvic floor muscle contraction training Therapeutic activities: Strict bowel training    PATIENT EDUCATION:  Education details: See above Person educated: Patient Education method: Explanation, Demonstration, Tactile cues, Verbal cues, and Handouts Education comprehension: verbalized understanding  HOME EXERCISE PROGRAM: [id number]   ASSESSMENT:  CLINICAL IMPRESSION: Pt not feeling well today due to abdominal pain and did not want to stay full session. She was strongly encouraged to call MD as soon as she leaves in order to be seen for the  abdominal pain and abnormal bowel movements. We discussed that it is good she had CT scan to rule out any sinister cause of thoracic pain, but when palpating muscles surrounding this area, her pain is reproduced and significant muscular restriction present. The location of the pain likely has notable postural contribution due to flexed posture with forward head and rounded shoulders. This is also impactful to pelvic floor as it cannot lengthen and contract fully sitting in this flexed posture. Due to this, postural correction exercises provided with good tolerance in addition to soft tissue mobilization to help improve pain and allowing her to perform these exercises. She was also given a tennis ball and instructed how to perform self massage in standing at home. She will continue to benefit from skilled PT intervention in order to increase frequency and ease of bowel movements, achieve complete evacuation, and begin/progress functional strengthening program.    OBJECTIVE IMPAIRMENTS: decreased activity tolerance, decreased coordination, decreased endurance, decreased strength, increased fascial restrictions, increased muscle spasms, impaired tone, postural dysfunction, and pain.   ACTIVITY LIMITATIONS: continence and bowel movements   PARTICIPATION LIMITATIONS:  having successful bowel movements   PERSONAL FACTORS: 1 comorbidity: medical history  are also affecting patient's functional outcome.   REHAB POTENTIAL: Good  CLINICAL DECISION MAKING: Stable/uncomplicated  EVALUATION COMPLEXITY: Low   GOALS: Goals reviewed with patient? Yes  SHORT TERM GOALS: Target date: 04/10/23  Pt will be independent with HEP.   Baseline: Goal status: INITIAL  2.  Pt will be independent with use of squatty potty, relaxed toileting mechanics, and improved bowel movement techniques in order to increase ease of bowel movements and complete evacuation.   Baseline:  Goal status: INITIAL  3.  Pt will be  independent with splinting in order to improve ease of bowel movements and complete evacuation.  Baseline:  Goal status: INITIAL  4.  Pt will increase pelvic floor muscle strength to 2/5.  Baseline:  Goal status: INITIAL  5.  Pt will be able to correctly perform diaphragmatic breathing and appropriate pressure management in order to prevent worsening vaginal wall laxity and improve pelvic floor A/ROM.   Baseline:  Goal status: INITIAL   LONG TERM GOALS: Target date: 05/08/23  Pt will be independent with advanced HEP.   Baseline:  Goal status: INITIAL  2.  Pt will increase pelvic floor muscle strength to 3/5. Baseline:  Goal status: INITIAL  3.  Pt will increase intra-rectal pressure generation in order to successfully have complete bowel movement.  Baseline:  Goal status: INITIAL  4.  Pt will report no leaks with laughing, coughing, sneezing in order to improve comfort with interpersonal relationships and community activities.   Baseline:  Goal status: INITIAL  5.  Pt will report no leaking on her way to the bathroom and be independent with use of urge drill.  Baseline:  Goal status: INITIAL  6.  Pt will report at least 3 bowel movements a week.  Baseline:  Goal status: INITIAL  PLAN:  PT FREQUENCY: 1-2x/week  PT DURATION: 8 weeks  PLANNED INTERVENTIONS: 97110-Therapeutic exercises, 97530- Therapeutic activity, 97112- Neuromuscular re-education, 97535- Self Care, 16109- Manual therapy, Dry Needling, and Biofeedback  PLAN FOR NEXT SESSION: Abdominal scar tissue mobilization; pelvic floor muscle contraction training; push training; progress core training and exercise progressions.    Julio Alm, PT, DPT02/11/259:09 AM

## 2023-04-08 DIAGNOSIS — R0781 Pleurodynia: Secondary | ICD-10-CM | POA: Diagnosis not present

## 2023-04-08 DIAGNOSIS — K5901 Slow transit constipation: Secondary | ICD-10-CM | POA: Diagnosis not present

## 2023-04-17 ENCOUNTER — Encounter: Payer: Self-pay | Admitting: Emergency Medicine

## 2023-04-17 ENCOUNTER — Ambulatory Visit: Payer: Medicare Other | Admitting: Emergency Medicine

## 2023-04-17 VITALS — BP 125/67 | HR 50 | Temp 97.4°F | Ht 64.0 in | Wt 151.2 lb

## 2023-04-17 DIAGNOSIS — J449 Chronic obstructive pulmonary disease, unspecified: Secondary | ICD-10-CM

## 2023-04-17 DIAGNOSIS — R0781 Pleurodynia: Secondary | ICD-10-CM

## 2023-04-17 NOTE — Assessment & Plan Note (Signed)
Left lower costal margin pain with exertion and on palpation today.  I did not see any evidence of a parenchymal abnormality or rib fracture on her CT chest that was done 03/30/2023.  She could have cartilage/muscular injury, also possibly cracked rib that I cannot see on imaging.  Unfortunately she will have to manage this conservatively and symptomatically until it resolves.  Recommended heat/ice, Tylenol as needed.  If she has cough that contributes to the strain in the area then she can consider a binder.

## 2023-04-17 NOTE — Assessment & Plan Note (Signed)
Overall clinically stable.  She is not having a lot of cough so I do not think this is contributing to intercostal muscle strain, etc.  Doing well on Trelegy.  She will follow-up with Dr. Marchelle Gearing

## 2023-04-17 NOTE — Progress Notes (Signed)
Subjective:    [name], female    DOB: 02-23-1941, 83 y.o.   MRN: [id number]  HPI Acute office visit 04/17/2023 Shelby Lin is 67 and has been followed by Dr. Marchelle Gearing.  She has a minimal tobacco history, COPD with emphysema, exertional hypoxemia.  PMH also significant for diabetes with associated neuropathy, Hashimoto's thyroiditis, hyperlipidemia Raynaud's, anxiety. She is here today complaining of left lower infrascapular pain that has been present since early January. She does not recall any trauma or discrete injury. It comes and goes - present most of the time. Hurts the most with exertion, can be sharp. She has tried, heat, ice, tylenol.   High-resolution CT scan of the chest was done on 03/30/2023, reviewed by me, shows centrilobular emphysema with some scattered pulmonary parenchymal scar.  No evidence of reticulation or traction bronchiectasis.  No groundglass or evidence for honeycomb.  There is a moderate hiatal hernia. No evidence rib fracture.    Review of Systems As per HPI  Past Medical History:  Diagnosis Date   Anxiety    Arthritis    Asthma in child    Atherosclerotic heart disease of native coronary artery without angina pectoris    Cataracts, bilateral    Combined hyperlipidemia    Constipation, unspecified    COPD (chronic obstructive pulmonary disease) (HCC)    Coronary artery spasm (HCC)    Diabetic neuropathy (HCC)    DM (diabetes mellitus) with complications (HCC)    Dupuytren's contracture    Dyspepsia    Familial tremor    Fatigue    Fibromyalgia syndrome    GERD (gastroesophageal reflux disease)    Hashimoto's thyroiditis    Hiatal hernia    History of colon polyps    Hx of Hashimoto thyroiditis    Hypercholesteremia    Hypertension    Hypothyroidism, acquired, autoimmune    IBS (irritable bowel syndrome)    Meniere's disease of left ear    Migraine without aura and without status migrainosus, not intractable    Multinodular  goiter (nontoxic)    Nonexudative macular degeneration    Raynaud's syndrome without gangrene    Situational stress    Tachycardia    Thyroiditis, autoimmune    Ulcerative colitis, unspecified, without complications (HCC)    Vitamin B 12 deficiency    Vitamin D deficiency    Wears glasses      Family History  Problem Relation Age of Onset   Breast cancer Mother 49   Hypertension Mother    Stroke Mother    Heart attack Father    Sudden death Father    Hypertension Sister    Diabetes Sister    Stroke Sister    Breast cancer Paternal Aunt 89 - 40   Ovarian cancer Paternal Aunt 44 - 42   Thyroid disease Neg Hx      Social History   Socioeconomic History   Marital status: Married    Spouse name: edward   Number of children: 2   Years of education: 12th   Highest education level: Not on file  Occupational History    Employer: [employer]  Tobacco Use   Smoking status: Former    Current packs/day: 0.00    Average packs/day: 0.5 packs/day for 42.0 years (21.0 ttl pk-yrs)    Types: Cigarettes    Start date: 02/22/1959    Quit date: 02/21/2001    Years since quitting: 22.1   Smokeless tobacco: Never  Vaping Use  Vaping status: Never Used  Substance and Sexual Activity   Alcohol use: No   Drug use: No   Sexual activity: Not on file  Other Topics Concern   Not on file  Social History Narrative   Patient lives at home with the husband.. Patient drinks coffee   Social Drivers of Corporate investment banker Strain: Not on file  Food Insecurity: Low Risk  (07/23/2022)   [facility]   Hunger Vital Sign    Worried About Running Out of Food in the Last Year: Never true    Ran Out of Food in the Last Year: Never true  Transportation Needs: Not on file (07/23/2022)  Physical Activity: Not on file  Stress: Not on file  Social Connections: Not on file  Intimate Partner Violence: Not on file     Allergies  Allergen Reactions    Acyclovir And Related    Allegra [Fexofenadine] Hives   Amoxicillin Hives   Codeine Nausea And Vomiting   Elemental Sulfur Hives   Enalapril Maleate Cough   Hydrocodone Bit-Homatrop Mbr Other (See Comments)   Keflex [Cephalexin] Hives   Levofloxacin Other (See Comments)    insomnia   Lorabid [Loracarbef] Hives   Misc. Sulfonamide Containing Compounds    Other Other (See Comments)   Sulfa Antibiotics Hives    Other reaction(s): hives     Outpatient Medications Prior to Visit  Medication Sig Dispense Refill   albuterol (VENTOLIN HFA) 108 (90 Base) MCG/ACT inhaler Inhale 1-2 puffs into the lungs every 6 (six) hours as needed for wheezing or shortness of breath. 18 g 6   aspirin EC 81 MG tablet Take 1 tablet (81 mg total) by mouth daily.     azithromycin (ZITHROMAX) 250 MG tablet Take 2 tablets on first day, then 1 tablet daily until finished. 6 tablet 0   benzonatate (TESSALON) 200 MG capsule Take 1 capsule (200 mg total) by mouth 3 (three) times daily as needed for cough. 30 capsule 1   Cyanocobalamin (VITAMIN B 12 PO) Take by mouth daily.     DULoxetine (CYMBALTA) 30 MG capsule Take 30 mg by mouth 2 (two) times daily.     fluticasone (FLONASE) 50 MCG/ACT nasal spray Place 1 spray into both nostrils daily. 16 g 2   Fluticasone-Umeclidin-Vilant (TRELEGY ELLIPTA) 100-62.5-25 MCG/ACT AEPB INHALE 1 PUFF BY MOUTH EVERY DAY 60 each 11   furosemide (LASIX) 20 MG tablet Take 20 mg by mouth as needed.     hydrochlorothiazide (HYDRODIURIL) 25 MG tablet TAKE 1 TABLET BY MOUTH EVERY DAY IN THE MORNING  1   LORazepam (ATIVAN) 0.5 MG tablet TAKE 1/2 TAB EVERY 8 12 HOURS AS NEEDED FOR ANXIOUSNESS     losartan (COZAAR) 100 MG tablet Take 100 mg by mouth daily.  1   meclizine (ANTIVERT) 25 MG tablet 1/2 to 1 tablet by mouth every 8 hrs as needed for diziness     Multiple Vitamin (MULTIVITAMIN) tablet Take 1 tablet by mouth daily.     omeprazole (PRILOSEC) 20 MG capsule Take 20 mg by mouth daily.      Respiratory Therapy Supplies (FLUTTER) DEVI Use device 2-3 times a day to break up congestion 1 each 0   rosuvastatin (CRESTOR) 20 MG tablet Take 1 tablet (20 mg total) by mouth daily. 90 tablet 3   SYNTHROID 88 MCG tablet TAKE DAILY BEFORE BREAKFAST. 1 TABLET 6 DAYS EACH WEEK, BUT TAKE ONLY 1/2 TABLET ON THE 7TH DAY  30 tablet 5   VITAMIN D, CHOLECALCIFEROL, PO Take 50,000 Units by mouth once a week.      No facility-administered medications prior to visit.        Objective:   Physical Exam Today's Vitals   04/17/23 1103  BP: 125/67  Pulse: (!) 50  Temp: (!) 97.4 F (36.3 C)  TempSrc: Oral  SpO2: 95%  Weight: 151 lb 3.2 oz (68.6 kg)  Height: 5\' 4"  (1.626 m)   Body mass index is 25.95 kg/m. Gen: Pleasant, well-nourished, in no distress,  normal affect  ENT: No lesions,  mouth clear,  oropharynx clear, no postnasal drip  Neck: No JVD, no stridor  Lungs: No use of accessory muscles, few basilar inspiratory crackles that clear with a deep inspiration.  No wheezing  Cardiovascular: RRR, heart sounds normal, no murmur or gallops, no peripheral edema  Musculoskeletal: Painful on palpation left posterior lateral costal margin  Neuro: alert, awake, non focal  Skin: Warm, no lesions or rash      Assessment & Plan:   Stage 3 severe COPD by GOLD classification (HCC) Overall clinically stable.  She is not having a lot of cough so I do not think this is contributing to intercostal muscle strain, etc.  Doing well on Trelegy.  She will follow-up with Dr. Marchelle Gearing  Costal margin pain Left lower costal margin pain with exertion and on palpation today.  I did not see any evidence of a parenchymal abnormality or rib fracture on her CT chest that was done 03/30/2023.  She could have cartilage/muscular injury, also possibly cracked rib that I cannot see on imaging.  Unfortunately she will have to manage this conservatively and symptomatically until it resolves.  Recommended heat/ice,  Tylenol as needed.  If she has cough that contributes to the strain in the area then she can consider a binder.   Levy Pupa, MD, PhD 04/17/2023, 11:26 AM [facility] [phone] or if no answer before 7:00PM call [phone] For any issues after 7:00PM please call eLink [phone]

## 2023-04-17 NOTE — Patient Instructions (Signed)
Your CT scan of the chest does not show any new findings in the lungs, no evidence of rib fracture.  Good news. Continue to use ice and heat, Tylenol as needed for pain control.  If you develop coughing it might be beneficial to use a binder. Continue your Trelegy as you have been taking it.  Rinse and gargle after using. Follow Dr. Marchelle Gearing as planned.

## 2023-04-21 DIAGNOSIS — E538 Deficiency of other specified B group vitamins: Secondary | ICD-10-CM | POA: Diagnosis not present

## 2023-04-21 DIAGNOSIS — E039 Hypothyroidism, unspecified: Secondary | ICD-10-CM | POA: Diagnosis not present

## 2023-04-21 DIAGNOSIS — E559 Vitamin D deficiency, unspecified: Secondary | ICD-10-CM | POA: Diagnosis not present

## 2023-04-21 DIAGNOSIS — Z23 Encounter for immunization: Secondary | ICD-10-CM | POA: Diagnosis not present

## 2023-04-21 DIAGNOSIS — M797 Fibromyalgia: Secondary | ICD-10-CM | POA: Diagnosis not present

## 2023-04-21 DIAGNOSIS — K5190 Ulcerative colitis, unspecified, without complications: Secondary | ICD-10-CM | POA: Diagnosis not present

## 2023-04-21 DIAGNOSIS — J449 Chronic obstructive pulmonary disease, unspecified: Secondary | ICD-10-CM | POA: Diagnosis not present

## 2023-04-21 DIAGNOSIS — Z0000 Encounter for general adult medical examination without abnormal findings: Secondary | ICD-10-CM | POA: Diagnosis not present

## 2023-04-21 DIAGNOSIS — E118 Type 2 diabetes mellitus with unspecified complications: Secondary | ICD-10-CM | POA: Diagnosis not present

## 2023-04-21 DIAGNOSIS — I10 Essential (primary) hypertension: Secondary | ICD-10-CM | POA: Diagnosis not present

## 2023-04-21 DIAGNOSIS — E7800 Pure hypercholesterolemia, unspecified: Secondary | ICD-10-CM | POA: Diagnosis not present

## 2023-04-21 DIAGNOSIS — G43009 Migraine without aura, not intractable, without status migrainosus: Secondary | ICD-10-CM | POA: Diagnosis not present

## 2023-04-22 ENCOUNTER — Ambulatory Visit: Payer: Medicare Other | Admitting: Nurse Practitioner

## 2023-04-22 ENCOUNTER — Encounter: Payer: Self-pay | Admitting: Nurse Practitioner

## 2023-04-22 VITALS — BP 120/64 | Temp 98.0°F | Ht 64.0 in | Wt 154.4 lb

## 2023-04-22 DIAGNOSIS — M62838 Other muscle spasm: Secondary | ICD-10-CM

## 2023-04-22 DIAGNOSIS — J449 Chronic obstructive pulmonary disease, unspecified: Secondary | ICD-10-CM

## 2023-04-22 DIAGNOSIS — S29012A Strain of muscle and tendon of back wall of thorax, initial encounter: Secondary | ICD-10-CM

## 2023-04-22 DIAGNOSIS — J439 Emphysema, unspecified: Secondary | ICD-10-CM

## 2023-04-22 DIAGNOSIS — G4736 Sleep related hypoventilation in conditions classified elsewhere: Secondary | ICD-10-CM

## 2023-04-22 MED ORDER — BACLOFEN 5 MG PO TABS: 5.0000 mg | ORAL_TABLET | Freq: Three times a day (TID) | ORAL | 0 refills | Status: DC | PRN

## 2023-04-22 MED ORDER — PREDNISONE 10 MG PO TABS: ORAL_TABLET | ORAL | 0 refills | Status: DC

## 2023-04-22 MED ORDER — LIDOCAINE 5 % EX PTCH: 1.0000 | MEDICATED_PATCH | CUTANEOUS | 0 refills | Status: DC

## 2023-04-22 NOTE — Progress Notes (Signed)
 @Patient  ID: Shelby Lin, female    DOB: Oct 23, 1940, 83 y.o.   MRN: [id number]  Chief Complaint  Patient presents with   Follow-up    Referring provider: Merri Brunette, MD  HPI: 83 year old female, former smoker (21 pack years) followed for stage III severe COPD by Gold classification and bronchiectasis.  She is a patient of Dr. Jane Canary and was last seen in office on 04/17/2023 by Dr. Delton Coombes for acute visit.  Past medical history significant for coronary artery spasms, hypertension, GERD, hypothyroidism, Mnire's disease, fibromyalgia, prediabetes.  TEST/EVENTS:  01/28/2018 CT chest without contrast: Atherosclerosis.  Mild to moderate centrilobular emphysema with diffuse bronchial wall thickening.  Right middle lobe solid 8 x 2 mm pulmonary nodule stable since 02/11/2017 CT chest, considered benign.  Separate 2 mm solid right middle lobe pulmonary nodule is decreased from 4 mm considered benign.  Minimal scattered cylindrical bronchiectasis in right middle lobe and medial bilateral upper lobe is unchanged.  Small hiatal hernia. 09/10/2020 CXR 2 view: Lungs clear.  Heart size normal.  Atherosclerosis.  No acute disease. 01/09/2019 PFTs: FVC 1.93 (73), FEV1 0.95 (48), ratio 49, DLCO uncorrected 10.82 (57) 12/30/2021 CXR 2 view: clear lungs without acute process  06/13/2022 HRCT chest: atherosclerosis. No LAD. Small hiatal hernia. Scattered btx mild btx. Diffuse bronchial wall thickening with mild emphysema. Partial collapse of bronchi with inspiration and expiration; bronchomalacia.  03/30/2023 HRCT chest: atherosclerosis. Enlarged pulmonic trunk. Emphysema. Scattered scarring. Limited evaluation for air trapping. High density lesion in left kidney, too small to characterize. Small to moderate hiatal hernia.   03/09/2023: OV with Dr. Marchelle Gearing. Advanced COPD. Patient presents for follow up. New onset exercise hypoxia compared to March 2024; however, not feeling it. No further exacerbations since  June. Main issue is some constipation - going to start pelvic floor therapy. She is also having some infrascapular pain on the left, new onset. On and off. Stable COPD. HRCT supine and prone.   04/17/2023: OV with Dr. Delton Coombes for acute visit. Left infrascapular pain, present since January. No specific trauma/injury. Hurts most with exertion, can be sharp. Has tried heat, ice, tylenol. Not much cough to contribute to the discomfort. Doing well on Trelegy. No abnormality on CT but could have cartilage/muscular injury or possible cracked rib that's unable to be visualized. These would all require conservative/symptom treatment.   04/22/2023: Today - follow up Patient presents today for follow up. She tells me that she's still having the discomfort on her left side. She feels like it is better than when it started but not gone. Symptoms first occurred early January. She saw her orthospine doctor who referred her to PT. She felt like this actually made her worse then she started having more trouble with constipation and was going to pelvic floor therapy. She notices the pain mostly with exertion, especially certain movements. She feels like it catches and "clamps down" at times then releases. She denies any issues with her breathing, coughing, chest congestion, hemoptysis, leg swelling, calf pain, palpitations. She is using acetaminophen and heat, which does help some. Still on her Trelegy. Hasn't had to use her rescue.   Allergies  Allergen Reactions   Acyclovir And Related    Allegra [Fexofenadine] Hives   Amoxicillin Hives   Codeine Nausea And Vomiting   Elemental Sulfur Hives   Enalapril Maleate Cough   Hydrocodone Bit-Homatrop Mbr Other (See Comments)   Keflex [Cephalexin] Hives   Levofloxacin Other (See Comments)    insomnia   Lorabid [Loracarbef]  Hives   Misc. Sulfonamide Containing Compounds    Other Other (See Comments)   Sulfa Antibiotics Hives    Other reaction(s): hives    Immunization  History  Administered Date(s) Administered   Influenza Split 11/30/2007, 01/17/2015, 01/08/2016, 11/27/2017, 12/14/2018   Influenza, High Dose Seasonal PF 12/01/2012, 12/05/2013, 12/12/2016, 11/27/2017, 12/14/2018, 01/01/2021   Influenza-Unspecified 12/18/2011, 02/16/2023   PFIZER(Purple Top)SARS-COV-2 Vaccination 05/05/2019, 05/26/2019, 12/12/2019   PNEUMOCOCCAL CONJUGATE-20 04/21/2023   Pneumococcal Conjugate-13 07/18/2015, 10/16/2017   Pneumococcal Polysaccharide-23 10/23/2008, 10/20/2018   Td 07/13/2007   Tdap 07/27/2017   Zoster, Live 05/04/2014    Past Medical History:  Diagnosis Date   Anxiety    Arthritis    Asthma in child    Atherosclerotic heart disease of native coronary artery without angina pectoris    Cataracts, bilateral    Combined hyperlipidemia    Constipation, unspecified    COPD (chronic obstructive pulmonary disease) (HCC)    Coronary artery spasm (HCC)    Diabetic neuropathy (HCC)    DM (diabetes mellitus) with complications (HCC)    Dupuytren's contracture    Dyspepsia    Familial tremor    Fatigue    Fibromyalgia syndrome    GERD (gastroesophageal reflux disease)    Hashimoto's thyroiditis    Hiatal hernia    History of colon polyps    Hx of Hashimoto thyroiditis    Hypercholesteremia    Hypertension    Hypothyroidism, acquired, autoimmune    IBS (irritable bowel syndrome)    Meniere's disease of left ear    Migraine without aura and without status migrainosus, not intractable    Multinodular goiter (nontoxic)    Nonexudative macular degeneration    Raynaud's syndrome without gangrene    Situational stress    Tachycardia    Thyroiditis, autoimmune    Ulcerative colitis, unspecified, without complications (HCC)    Vitamin B 12 deficiency    Vitamin D deficiency    Wears glasses     Tobacco History: Social History   Tobacco Use  Smoking Status Former   Current packs/day: 0.00   Average packs/day: 0.5 packs/day for 42.0 years (21.0  ttl pk-yrs)   Types: Cigarettes   Start date: 02/22/1959   Quit date: 02/21/2001   Years since quitting: 22.1  Smokeless Tobacco Never   Counseling given: Not Answered   Outpatient Medications Prior to Visit  Medication Sig Dispense Refill   albuterol (VENTOLIN HFA) 108 (90 Base) MCG/ACT inhaler Inhale 1-2 puffs into the lungs every 6 (six) hours as needed for wheezing or shortness of breath. 18 g 6   aspirin EC 81 MG tablet Take 1 tablet (81 mg total) by mouth daily.     azithromycin (ZITHROMAX) 250 MG tablet Take 2 tablets on first day, then 1 tablet daily until finished. 6 tablet 0   benzonatate (TESSALON) 200 MG capsule Take 1 capsule (200 mg total) by mouth 3 (three) times daily as needed for cough. 30 capsule 1   Cyanocobalamin (VITAMIN B 12 PO) Take by mouth daily.     DULoxetine (CYMBALTA) 30 MG capsule Take 30 mg by mouth 2 (two) times daily.     fluticasone (FLONASE) 50 MCG/ACT nasal spray Place 1 spray into both nostrils daily. 16 g 2   Fluticasone-Umeclidin-Vilant (TRELEGY ELLIPTA) 100-62.5-25 MCG/ACT AEPB INHALE 1 PUFF BY MOUTH EVERY DAY 60 each 11   furosemide (LASIX) 20 MG tablet Take 20 mg by mouth as needed.     hydrochlorothiazide (HYDRODIURIL) 25 MG tablet TAKE  1 TABLET BY MOUTH EVERY DAY IN THE MORNING  1   LORazepam (ATIVAN) 0.5 MG tablet TAKE 1/2 TAB EVERY 8 12 HOURS AS NEEDED FOR ANXIOUSNESS     losartan (COZAAR) 100 MG tablet Take 100 mg by mouth daily.  1   meclizine (ANTIVERT) 25 MG tablet 1/2 to 1 tablet by mouth every 8 hrs as needed for diziness     Multiple Vitamin (MULTIVITAMIN) tablet Take 1 tablet by mouth daily.     omeprazole (PRILOSEC) 20 MG capsule Take 20 mg by mouth daily.     Respiratory Therapy Supplies (FLUTTER) DEVI Use device 2-3 times a day to break up congestion 1 each 0   rosuvastatin (CRESTOR) 20 MG tablet Take 1 tablet (20 mg total) by mouth daily. 90 tablet 3   SYNTHROID 88 MCG tablet TAKE DAILY BEFORE BREAKFAST. 1 TABLET 6 DAYS EACH  WEEK, BUT TAKE ONLY 1/2 TABLET ON THE 7TH DAY 30 tablet 5   VITAMIN D, CHOLECALCIFEROL, PO Take 50,000 Units by mouth once a week.      No facility-administered medications prior to visit.     Review of Systems:   Constitutional: No weight loss or gain, night sweats, fevers, chills, fatigue, or lassitude. HEENT: No headaches, difficulty swallowing, tooth/dental problems, or sore throat. No sneezing, itching, ear ache,  nasal congestion CV:  No chest pain, orthopnea, PND, swelling in lower extremities, anasarca, dizziness, palpitations, syncope Resp: +shortness of breath with exertion (baseline). No cough. No wheezing. No hemoptysis.  No chest wall deformity GI:  No heartburn, indigestion, abdominal pain, nausea, vomiting, diarrhea, change in bowel habits, loss of appetite, bloody stools.  MSK:  No joint pain or swelling.  +left sided mid back pain  Neuro: No dizziness or lightheadedness.  Psych: No depression or anxiety. Mood stable.     Physical Exam:  BP 120/64 (BP Location: Right Arm, Patient Position: Sitting, Cuff Size: Normal)   Temp 98 F (36.7 C) (Oral)   Ht 5\' 4"  (1.626 m)   Wt 154 lb 6.4 oz (70 kg)   SpO2 98%   BMI 26.50 kg/m   GEN: Pleasant, interactive, well-nourished, well-appearing; in no acute distress. HEENT:  Normocephalic and atraumatic. PERRLA. Sclera white. Nasal turbinates pink, moist and patent bilaterally. No rhinorrhea present. Oropharynx pink and moist, without exudate or edema. No lesions, ulcerations NECK:  Supple w/ fair ROM. No JVD present. Normal carotid impulses w/o bruits. Thyroid symmetrical with no goiter or nodules palpated. No lymphadenopathy.   CV: RRR, no m/r/g, no peripheral edema. Pulses intact, +2 bilaterally. No cyanosis, pallor or clubbing. PULMONARY:  Unlabored, regular breathing. Clear bilaterally A&P w/o wheezes/rales/rhonchi. No accessory muscle use. No dullness to percussion. GI: BS present and normoactive. Soft, non-tender to  palpation. No organomegaly or masses detected. MSK: No erythema, warmth. Cap refil <2 sec all extrem. Tenderness to palpation along left latissimus dorsi muscle; no crepitus or ecchymosis  Neuro: A/Ox3. No focal deficits noted.   Skin: Warm, no lesions or rashe Psych: Normal affect and behavior. Judgement and thought content appropriate.     Lab Results:  CBC    Component Value Date/Time   WBC 8.8 03/09/2023 1638   RBC 4.11 03/09/2023 1638   HGB 12.8 03/09/2023 1638   HCT 38.7 03/09/2023 1638   PLT 311.0 03/09/2023 1638   MCV 94.2 03/09/2023 1638   MCH 29.5 02/28/2018 1732   MCHC 33.0 03/09/2023 1638   RDW 13.1 03/09/2023 1638   LYMPHSABS 2.7 03/09/2023 1638  MONOABS 0.9 03/09/2023 1638   EOSABS 0.3 03/09/2023 1638   BASOSABS 0.1 03/09/2023 1638    BMET    Component Value Date/Time   NA 136 02/28/2018 1732   K 3.5 02/28/2018 1732   CL 98 02/28/2018 1732   CO2 29 02/28/2018 1732   GLUCOSE 133 (H) 02/28/2018 1732   BUN 12 02/28/2018 1732   CREATININE 0.63 02/28/2018 1732   CREATININE 0.59 (L) 02/22/2016 0001   CALCIUM 10.0 06/19/2021 1020   CALCIUM 9.7 04/06/2012 1019   GFRNONAA >60 02/28/2018 1732   GFRAA >60 02/28/2018 1732    BNP    Component Value Date/Time   BNP 25.5 02/28/2018 1732     Imaging:  CT Chest High Resolution Result Date: 04/10/2023 CLINICAL DATA:  Former smoker, shortness of breath on exertion, left-sided back and lower rib pain for 6 weeks. EXAM: CT CHEST WITHOUT CONTRAST TECHNIQUE: Multidetector CT imaging of the chest was performed following the standard protocol without intravenous contrast. High resolution imaging of the lungs, as well as inspiratory and expiratory imaging, was performed. RADIATION DOSE REDUCTION: This exam was performed according to the departmental dose-optimization program which includes automated exposure control, adjustment of the mA and/or kV according to patient size and/or use of iterative reconstruction technique.  COMPARISON:  06/13/2022. FINDINGS: Cardiovascular: Atherosclerotic calcification of the aorta and coronary arteries. Heart size normal. No pericardial effusion. Enlarged pulmonic trunk. Mediastinum/Nodes: No pathologically enlarged mediastinal or axillary lymph nodes. Hilar regions are difficult to definitively evaluate without IV contrast. Air in the esophagus can be seen with dysmotility. Lungs/Pleura: Centrilobular emphysema. Scattered pulmonary parenchymal scarring. Negative for subpleural reticulation, traction bronchiectasis/bronchiolectasis, ground glass, architectural distortion or honeycombing. Expiratory phase imaging was not performed in true expiration, limiting the evaluation for air trapping. No pleural fluid. Airway is unremarkable. Upper Abdomen: Cholecystectomy. Subcentimeter high density lesion in the left kidney, too small to characterize. No specific follow-up necessary. Small to moderate hiatal hernia. Partially imaged duodenal diverticulum. Visualized portions of the liver, adrenal glands, kidneys, spleen, pancreas, stomach and bowel are otherwise grossly unremarkable. No upper abdominal adenopathy. Musculoskeletal: Degenerative changes in the spine. IMPRESSION: 1. No evidence of interstitial lung disease. 2. Small to moderate hiatal hernia. 3.  Emphysema (ICD10-J43.9). 4. Aortic atherosclerosis (ICD10-I70.0). Coronary artery calcification. 5. Enlarged pulmonic trunk, indicative of pulmonary arterial hypertension. Electronically Signed   By: Leanna Battles M.D.   On: 04/10/2023 14:00    Administration History     None          Latest Ref Rng & Units 01/08/2021   10:46 AM 04/01/2017    9:41 AM  PFT Results  FVC-Pre L 1.93  1.69   FVC-Predicted Pre % 73  61   FVC-Post L  2.02   FVC-Predicted Post %  73   Pre FEV1/FVC % % 49  45   Post FEV1/FCV % %  48   FEV1-Pre L 0.95  0.76   FEV1-Predicted Pre % 48  36   FEV1-Post L  0.96   DLCO uncorrected ml/min/mmHg 10.82  12.81    DLCO UNC% % 57  52   DLCO corrected ml/min/mmHg 10.82    DLCO COR %Predicted % 57    DLVA Predicted % 88  84     No results found for: "NITRICOXIDE"      Assessment & Plan:   Strain of left latissimus dorsi muscle Appears to be MSK in nature. Worsens with push/pull movements. She also seems to be having spasmatic episodes. Slight improvement  with conservative measures but at this point, recommend course of oral steroids, topical lidocaine, and short term PRN baclofen. Side effect/safety profile reviewed. Advised to rest for the next week and minimize usage. Can slowly start reintroducing push/pull movements and adjust based on pain. No evidence of respiratory etiology. Advised to follow up with PCP if symptoms fail to improve or worsen.   Patient Instructions  Continue Albuterol inhaler 2 puffs every 6 hours as needed for shortness of breath or wheezing. Notify if symptoms persist despite rescue inhaler/neb use.  Continue flonase nasal spray 2 sprays each nostril daily Continue Trelegy 1 puff daily. Brush tongue and rinse mouth afterwards Continue flutter valve 2-3 times a day for chest congestion  Prednisone - 2 tablets for 5 days then 1 tablet for 5 days then stop. Take in AM with food Use lidocaine patches to affected area - apply every 24 hours and remove after 12 hours until symptoms improve Baclofen 5 mg Three times a day as needed for muscle spasm. May cause drowsiness. Do not drive after taking. Use caution when getting up or changing positions as it could increase risk for falls Continue heat and over the counter acetaminophen as needed   Would rest for the next week and don't do any push/pull activities. You can still walk for exercise    Follow up in 4 months with Dr. Marchelle Gearing or Philis Nettle. If symptoms do not improve or worsen, please contact office for sooner follow up or seek emergency care.    Stage 3 severe COPD by GOLD classification (HCC) Stable. Appears  compensated on current regimen. No recent exacerbations. No significant cough. See above. Action plan in place.   Nocturnal hypoxemia due to emphysema (HCC) No current exertional requirements. Able to maintain >90% on room air. Continue nocturnal therapy. Goal >88-90%      Noemi Chapel, NP 04/22/2023  Pt aware and understands NP's role.

## 2023-04-22 NOTE — Assessment & Plan Note (Signed)
 Stable. Appears compensated on current regimen. No recent exacerbations. No significant cough. See above. Action plan in place.

## 2023-04-22 NOTE — Assessment & Plan Note (Signed)
 No current exertional requirements. Able to maintain >90% on room air. Continue nocturnal therapy. Goal >88-90%

## 2023-04-22 NOTE — Patient Instructions (Signed)
 Continue Albuterol inhaler 2 puffs every 6 hours as needed for shortness of breath or wheezing. Notify if symptoms persist despite rescue inhaler/neb use.  Continue flonase nasal spray 2 sprays each nostril daily Continue Trelegy 1 puff daily. Brush tongue and rinse mouth afterwards Continue flutter valve 2-3 times a day for chest congestion  Prednisone - 2 tablets for 5 days then 1 tablet for 5 days then stop. Take in AM with food Use lidocaine patches to affected area - apply every 24 hours and remove after 12 hours until symptoms improve Baclofen 5 mg Three times a day as needed for muscle spasm. May cause drowsiness. Do not drive after taking. Use caution when getting up or changing positions as it could increase risk for falls Continue heat and over the counter acetaminophen as needed   Would rest for the next week and don't do any push/pull activities. You can still walk for exercise    Follow up in 4 months with Dr. Marchelle Gearing or Philis Nettle. If symptoms do not improve or worsen, please contact office for sooner follow up or seek emergency care.

## 2023-04-22 NOTE — Assessment & Plan Note (Addendum)
 Appears to be MSK in nature. Worsens with push/pull movements. She also seems to be having spasmatic episodes. Slight improvement with conservative measures but at this point, recommend course of oral steroids, topical lidocaine, and short term PRN baclofen. Side effect/safety profile reviewed. Advised to rest for the next week and minimize usage. Can slowly start reintroducing push/pull movements and adjust based on pain. No evidence of respiratory etiology. Advised to follow up with PCP if symptoms fail to improve or worsen.   Patient Instructions  Continue Albuterol inhaler 2 puffs every 6 hours as needed for shortness of breath or wheezing. Notify if symptoms persist despite rescue inhaler/neb use.  Continue flonase nasal spray 2 sprays each nostril daily Continue Trelegy 1 puff daily. Brush tongue and rinse mouth afterwards Continue flutter valve 2-3 times a day for chest congestion  Prednisone - 2 tablets for 5 days then 1 tablet for 5 days then stop. Take in AM with food Use lidocaine patches to affected area - apply every 24 hours and remove after 12 hours until symptoms improve Baclofen 5 mg Three times a day as needed for muscle spasm. May cause drowsiness. Do not drive after taking. Use caution when getting up or changing positions as it could increase risk for falls Continue heat and over the counter acetaminophen as needed   Would rest for the next week and don't do any push/pull activities. You can still walk for exercise    Follow up in 4 months with Dr. Marchelle Gearing or Philis Nettle. If symptoms do not improve or worsen, please contact office for sooner follow up or seek emergency care.

## 2023-04-23 ENCOUNTER — Telehealth: Payer: Self-pay

## 2023-04-23 NOTE — Telephone Encounter (Signed)
*  Pulm  Pharmacy Patient Advocate Encounter   Received notification from CoverMyMeds that prior authorization for Lidocaine 5% patches  is required/requested.   Insurance verification completed.   The patient is insured through [facility] .   Per test claim: PA required; PA submitted to above mentioned insurance via CoverMyMeds Key/confirmation #/EOC [facility] Status is pending

## 2023-04-24 NOTE — Telephone Encounter (Signed)
 Katie:  Lidocaine 5% patches has been DENIED. This medication is not of Medicare's Part D plan.  Please advise  See pharmacy note.  Thank you.

## 2023-04-24 NOTE — Telephone Encounter (Signed)
 Pharmacy Patient Advocate Encounter  Received notification from [facility] that Prior Authorization for Lidocaine 5% patches has been DENIED.  Full denial letter will be uploaded to the media tab. See denial reason below.   PA #/Case ID/Reference #:

## 2023-05-06 ENCOUNTER — Telehealth: Payer: Self-pay | Admitting: Nurse Practitioner

## 2023-05-06 DIAGNOSIS — S29012A Strain of muscle and tendon of back wall of thorax, initial encounter: Secondary | ICD-10-CM

## 2023-05-06 MED ORDER — PREDNISONE 10 MG PO TABS: ORAL_TABLET | ORAL | 0 refills | Status: DC

## 2023-05-06 NOTE — Telephone Encounter (Signed)
 Spoke with patient regarding prior message. Patient stated she has finished all her prednisone and she thought she was feeling better . Came back with revenge and patient is still having left side rib  pain . Patient's pain scale is at a 10 but in the past few night's its been a 2. Patient is using her Lidoderm patches and that seems to help patient was wondering if she would need another round of prednisone.  Florentina Addison can you please advise   Thank you

## 2023-05-06 NOTE — Telephone Encounter (Signed)
 Spoke with patient to let her know Florentina Addison had  sent in a high dosed steroid taper. I also put a referral in to sports medicine for further evaluation.    Patient's voice was understanding nothing else further needed.

## 2023-05-06 NOTE — Telephone Encounter (Signed)
 PT states she was given pred on her last visit and could tell it was making a difference. She states that now it is "back with a vengeance". Should she be Rx'd more Pred, she wonders. Her # is [phone]

## 2023-05-06 NOTE — Telephone Encounter (Signed)
 I sent in a high dosed steroid taper. I also put a referral in to sports medicine for further evaluation. Thanks!

## 2023-05-08 DIAGNOSIS — K5900 Constipation, unspecified: Secondary | ICD-10-CM | POA: Diagnosis not present

## 2023-05-08 DIAGNOSIS — R1310 Dysphagia, unspecified: Secondary | ICD-10-CM | POA: Diagnosis not present

## 2023-05-12 DIAGNOSIS — M9902 Segmental and somatic dysfunction of thoracic region: Secondary | ICD-10-CM | POA: Diagnosis not present

## 2023-05-12 DIAGNOSIS — M5134 Other intervertebral disc degeneration, thoracic region: Secondary | ICD-10-CM | POA: Diagnosis not present

## 2023-05-13 DIAGNOSIS — M9902 Segmental and somatic dysfunction of thoracic region: Secondary | ICD-10-CM | POA: Diagnosis not present

## 2023-05-13 DIAGNOSIS — M5134 Other intervertebral disc degeneration, thoracic region: Secondary | ICD-10-CM | POA: Diagnosis not present

## 2023-05-14 DIAGNOSIS — M5134 Other intervertebral disc degeneration, thoracic region: Secondary | ICD-10-CM | POA: Diagnosis not present

## 2023-05-14 DIAGNOSIS — M9902 Segmental and somatic dysfunction of thoracic region: Secondary | ICD-10-CM | POA: Diagnosis not present

## 2023-05-18 DIAGNOSIS — M9902 Segmental and somatic dysfunction of thoracic region: Secondary | ICD-10-CM | POA: Diagnosis not present

## 2023-05-18 DIAGNOSIS — M5134 Other intervertebral disc degeneration, thoracic region: Secondary | ICD-10-CM | POA: Diagnosis not present

## 2023-05-20 DIAGNOSIS — M5134 Other intervertebral disc degeneration, thoracic region: Secondary | ICD-10-CM | POA: Diagnosis not present

## 2023-05-20 DIAGNOSIS — M9902 Segmental and somatic dysfunction of thoracic region: Secondary | ICD-10-CM | POA: Diagnosis not present

## 2023-05-25 DIAGNOSIS — M5134 Other intervertebral disc degeneration, thoracic region: Secondary | ICD-10-CM | POA: Diagnosis not present

## 2023-05-25 DIAGNOSIS — M9902 Segmental and somatic dysfunction of thoracic region: Secondary | ICD-10-CM | POA: Diagnosis not present

## 2023-06-08 ENCOUNTER — Ambulatory Visit: Payer: Self-pay

## 2023-06-15 ENCOUNTER — Ambulatory Visit: Payer: Self-pay | Attending: Gastroenterology

## 2023-06-15 ENCOUNTER — Telehealth: Payer: Self-pay

## 2023-06-15 DIAGNOSIS — R279 Unspecified lack of coordination: Secondary | ICD-10-CM

## 2023-06-15 DIAGNOSIS — R293 Abnormal posture: Secondary | ICD-10-CM

## 2023-06-15 DIAGNOSIS — M6281 Muscle weakness (generalized): Secondary | ICD-10-CM

## 2023-06-15 NOTE — Therapy (Signed)
 OUTPATIENT PHYSICAL THERAPY FEMALE PELVIC TREATMENT   Patient Name: Shelby Lin MRN: [id number] DOB:[birth year], 83 y.o., female Today's Date: 06/15/2023  END OF SESSION:  PT End of Session - 06/15/23 0825     Visit Number 6    Date for PT Re-Evaluation 07/31/23    Authorization Type HTA    Progress Note Due on Visit 10    PT Start Time 0824    PT Stop Time 0845    PT Time Calculation (min) 21 min    Activity Tolerance Patient tolerated treatment well    Behavior During Therapy WFL for tasks assessed/performed               Past Medical History:  Diagnosis Date   Anxiety    Arthritis    Asthma in child    Atherosclerotic heart disease of native coronary artery without angina pectoris    Cataracts, bilateral    Combined hyperlipidemia    Constipation, unspecified    COPD (chronic obstructive pulmonary disease) (HCC)    Coronary artery spasm (HCC)    Diabetic neuropathy (HCC)    DM (diabetes mellitus) with complications (HCC)    Dupuytren's contracture    Dyspepsia    Familial tremor    Fatigue    Fibromyalgia syndrome    GERD (gastroesophageal reflux disease)    Hashimoto's thyroiditis    Hiatal hernia    History of colon polyps    Hx of Hashimoto thyroiditis    Hypercholesteremia    Hypertension    Hypothyroidism, acquired, autoimmune    IBS (irritable bowel syndrome)    Meniere's disease of left ear    Migraine without aura and without status migrainosus, not intractable    Multinodular goiter (nontoxic)    Nonexudative macular degeneration    Raynaud's syndrome without gangrene    Situational stress    Tachycardia    Thyroiditis, autoimmune    Ulcerative colitis, unspecified, without complications (HCC)    Vitamin B 12 deficiency    Vitamin D  deficiency    Wears glasses    Past Surgical History:  Procedure Laterality Date   ANAL RECTAL MANOMETRY Bilateral 11/26/2022   Procedure: ANO RECTAL MANOMETRY;  Surgeon: Evangeline Hilts, MD;  Location:  [facility];  Service: Gastroenterology;  Laterality: Bilateral;   BREAST EXCISIONAL BIOPSY Left    benign   BREAST EXCISIONAL BIOPSY Right    milk gland removed   BREAST LUMPECTOMY Left 1990   benign   BREAST LUMPECTOMY WITH NEEDLE LOCALIZATION Right 02/28/2013   Procedure: RIGHT BREAST NEEDLE LOCALIZATION LUMPECTOMY ;  Surgeon: Quitman Bucy, MD;  Location: [facility];  Service: General;  Laterality: Right;   CHOLECYSTECTOMY  2008   lapcholi   DILATION AND CURETTAGE OF UTERUS     LEFT HEART CATHETERIZATION WITH CORONARY ANGIOGRAM N/A 01/26/2013   Procedure: LEFT HEART CATHETERIZATION WITH CORONARY ANGIOGRAM;  Surgeon: Dorothye Gathers, MD;  Location: [facility];  Service: Cardiovascular;  Laterality: N/A;   PARTIAL HYSTERECTOMY  1971   TONSILLECTOMY     Patient Active Problem List   Diagnosis Date Noted   Strain of left latissimus dorsi muscle 04/22/2023   Costal margin pain 04/17/2023   URI (upper respiratory infection) 02/26/2021   COVID-19 virus detected 03/25/2019   Physical deconditioning 02/23/2019   Gastroesophageal reflux disease 10/20/2018   Healthcare maintenance 10/20/2018   Bronchiectasis without complication (HCC) 04/22/2018   Nocturnal hypoxemia due to emphysema (HCC) 04/22/2018   Prediabetes 04/10/2018   Stage  3 severe COPD by GOLD classification (HCC) 03/01/2018   Overweight 10/13/2016   Ear fullness, left 09/08/2016   Goiter 11/02/2013   Hoarseness 11/02/2013   Action tremor 05/18/2013   Abnormal stress test 01/26/2013   Angina decubitus (HCC) 01/26/2013   Chronic tension headaches 04/06/2012   Chronic night sweats 04/06/2012   Meniere's disease 04/06/2012   Chronic leg pain 04/06/2012   Hearing loss, sensorineural 08/25/2011   Hypothyroidism, acquired, autoimmune    Fibromyalgia syndrome    Thyroiditis, autoimmune    Multinodular goiter (nontoxic)    Tachycardia    Familial tremor    Coronary artery spasm (HCC)    Dyspepsia     Fatigue    Combined hyperlipidemia    Dupuytren's contracture    Other disturbances of aromatic amino-acid metabolism 06/18/2010    PCP: Faustina Hood, MD  REFERRING PROVIDER: Evangeline Hilts, MD  REFERRING DIAG: N81.84 (ICD-10-CM) - Pelvic muscle wasting  THERAPY DIAG:  Muscle weakness (generalized)  Unspecified lack of coordination  Abnormal posture  Rationale for Evaluation and Treatment: Rehabilitation  ONSET DATE: 3 months  SUBJECTIVE:                                                                                                                                                                                           SUBJECTIVE STATEMENT: Pt states that she is having horrible ribs cage pain on her Lt side - she has been seen to see MD and states that she was diagnosed with costochondritis. She feels like some weeks she has better bowel movements than others; she has taken laxatives several times in order to more completely better.   PAIN:  PAIN:  Are you having pain? Yes NPRS scale: 10/10 Pain location: Lt rib cage Pain orientation: Left  PAIN TYPE: aching Pain description: constant  Aggravating factors: sitting Relieving factors: movement    PRECAUTIONS: Other: Stage 3 severe COPD with exercise hypoxemia   RED FLAGS: None   WEIGHT BEARING RESTRICTIONS: No  FALLS:  Has patient fallen in last 6 months? No  LIVING ENVIRONMENT: Lives with: lives with their spouse Lives in: House/apartment  OCCUPATION: retired  PLOF: Independent  PATIENT GOALS: to have a bowel movement like a normal person  PERTINENT HISTORY:  IBS, colitis, partial hysterectomy 1971, cholecystectomy 2008, Severe COPD stage 3 with hypoxemia in exercise   BOWEL MOVEMENT: Pain with bowel movement: Yes Type of bowel movement:Frequency 1x/week, Strain Yes, and Splinting yes  - has had to dig bowel movement out Fully empty rectum: No Leakage: No Pads: No Fiber supplement: No - but  does get fiber in diet  URINATION: Pain  with urination: No Fully empty bladder: No Stream: Weak Urgency: Yes: - Frequency: 3-4x, 1x/night Leakage: Urge to void, Walking to the bathroom, Coughing, Sneezing, and Laughing Pads: Yes: 1 a day, sometimes 1 at night  INTERCOURSE: Not currently sexually active  PREGNANCY: Vaginal deliveries 2 Tearing Yes: first delivery - to rectum C-section deliveries 0 Currently pregnant No  PROLAPSE: none   OBJECTIVE:  Note: Objective measures were completed at Evaluation unless otherwise noted.  03/13/23:  PFIQ-7: 76  DIAGNOSTIC FINDINGS: Anal manometry: able to expel balloon after 40 seconds, decreased sensation at 70cc (normal 10-30cc), some increased sphincter tension with push attempts, decreased squeeze pressure, decreased resting pressure of external anal sphincter   COGNITION: Overall cognitive status: Within functional limits for tasks assessed     SENSATION: Light touch: Appears intact Proprioception: Appears intact   FUNCTIONAL TESTS:  Curl-up test: 2-3 width separation with distortion throughout midline  GAIT: Comments: forward flexed posture  POSTURE: rounded shoulders, forward head, decreased lumbar lordosis, increased thoracic kyphosis, and posterior pelvic tilt  PALPATION:   General  abdominal scar tissue restriction; decreased lateral rib cage expansion with inhale and abdominal drawing in                External Perineal Exam dryness, pale, labial fusion, visualization of posterior vaginal wall                              Internal Pelvic Floor:  Vaginal: no tenderness, atrophy of pelvic floor muscle bil in superficial and deep layers Rectal: no tenderness, decreased resting external anal sphincter tone, some ability to lengthen pelvic floor and build pressure with pushing, largely improved pressure generation with exhaling while pushing  Patient confirms identification and approves PT to assess internal pelvic  floor and treatment Yes  PELVIC MMT:   MMT eval  Vaginal 1/5, 3 second endurance, 4 repeat contractions  Internal Anal Sphincter 1/5  External Anal Sphincter 1/5  Puborectalis 0-1/5  Diastasis Recti 2-3 finger width separation  (Blank rows = not tested)        TONE: Low tone/atrophy  PROLAPSE: Grade 2 posterior vaginal wall laxity, grade 1 anterior vaginal wall laxity  TODAY'S TREATMENT:                                                                                                                              DATE:  No billable treatment today. Pt requested not to have reassessment.   04/07/23 Manual: Sitting soft tissue mobilization to Lt thoracic paraspinals Therapeutic activities: Self-massage with tennis ball Contacting MD about recent abdominal pain and diarrhea/bowel movements Seated postural correction exercises and impact on thoracic pain: Scapular retraction 10x Posterior shoulder rolls 2 x 10   03/30/23 Manual: Bowel mobilization  Neuromuscular re-education: Seated hip adduction ball press with transversus abdominus and pelvic floor muscle 2 x 10 Bridge with hip adduction, transversus abdominus, and pelvic floor  muscle 2 x 10  Supine hip abduction red band with transversus abdominus and pelvic floor muscle 2 x 10 Supine march with red band with transversus abdominus and pelvic floor muscle 2 x 10 Exercises: Lower trunk rotation 2 x 10 Single knee to chest 10x bil Bent knee fall out 10x bil   PATIENT EDUCATION:  Education details: See above Person educated: Patient Education method: Explanation, Demonstration, Tactile cues, Verbal cues, and Handouts Education comprehension: verbalized understanding  HOME EXERCISE PROGRAM: [id number]   ASSESSMENT:  CLINICAL IMPRESSION: Pt arrived over 20 minutes late for appointment today and did not want any active treatment or reassessment. She states that she feels like PT for her pelvic floor is too much right now  with everything else going on. We discussed that it sounds like PT for her costochondritis would be very helpful and she was encouraged to ask for a referral for that. We discussed that being able to increased exercise overall would likely help with her constipation. She was encouraged to continue use of squatty potty, good water intake, bowel mobilization, and exercises that we have done. We did not perform exam today, but it is likely that she has not seen good improvements in strength, coordination, and appropriate intra-rectal pressure generation given that her symptoms have not changed much. At this time, we agree that discharge is the best option, but she may benefit from returning in the future once life circumstances allow.   OBJECTIVE IMPAIRMENTS: decreased activity tolerance, decreased coordination, decreased endurance, decreased strength, increased fascial restrictions, increased muscle spasms, impaired tone, postural dysfunction, and pain.   ACTIVITY LIMITATIONS: continence and bowel movements   PARTICIPATION LIMITATIONS:  having successful bowel movements   PERSONAL FACTORS: 1 comorbidity: medical history  are also affecting patient's functional outcome.   REHAB POTENTIAL: Good  CLINICAL DECISION MAKING: Stable/uncomplicated  EVALUATION COMPLEXITY: Low   GOALS: Goals reviewed with patient? Yes  SHORT TERM GOALS: Target date: 04/10/23 - updated 06/15/23  Pt will be independent with HEP.   Baseline: Goal status: MET 06/15/23  2.  Pt will be independent with use of squatty potty, relaxed toileting mechanics, and improved bowel movement techniques in order to increase ease of bowel movements and complete evacuation.   Baseline:  Goal status: MET 06/15/23  3.  Pt will be independent with splinting in order to improve ease of bowel movements and complete evacuation.  Baseline:  Goal status: MET 06/15/23  4.  Pt will increase pelvic floor muscle strength to 2/5.  Baseline:   Goal status: DISCHARGED 06/15/23  5.  Pt will be able to correctly perform diaphragmatic breathing and appropriate pressure management in order to prevent worsening vaginal wall laxity and improve pelvic floor A/ROM.   Baseline:  Goal status: DISCHARGED 06/15/23   LONG TERM GOALS: Target date: 05/08/23 - updated 06/15/23  Pt will be independent with advanced HEP.   Baseline:  Goal status: DISCHARGED 06/15/23  2.  Pt will increase pelvic floor muscle strength to 3/5. Baseline:  Goal status: DISCHARGED 06/15/23  3.  Pt will increase intra-rectal pressure generation in order to successfully have complete bowel movement.  Baseline:  Goal status: DISCHARGED 06/15/23  4.  Pt will report no leaks with laughing, coughing, sneezing in order to improve comfort with interpersonal relationships and community activities.   Baseline:  Goal status: DISCHARGED 06/15/23  5.  Pt will report no leaking on her way to the bathroom and be independent with use of urge drill.  Baseline:  Goal  status: DISCHARGED 06/15/23  6.  Pt will report at least 3 bowel movements a week.  Baseline:  Goal status: DISCHARGED 06/15/23  PLAN:  PT FREQUENCY:-  PT DURATION: -  PLANNED INTERVENTIONS: -  PLAN FOR NEXT SESSION: D/C. Pt will plan to return to pelvic floor physical therapy once she goes to PT for her upper back pain and her husband is healthier    Verlena Glenn, PT, DPT04/21/259:38 AM

## 2023-06-15 NOTE — Telephone Encounter (Signed)
 Called and left message for patient after no-show appointment on 06/15/23. She was told when next appointment was. She was asked to call and confirm.

## 2023-06-17 ENCOUNTER — Ambulatory Visit: Attending: Family Medicine | Admitting: Physical Therapy

## 2023-06-17 ENCOUNTER — Other Ambulatory Visit: Payer: Self-pay

## 2023-06-17 DIAGNOSIS — R0781 Pleurodynia: Secondary | ICD-10-CM | POA: Diagnosis not present

## 2023-06-17 DIAGNOSIS — M5459 Other low back pain: Secondary | ICD-10-CM | POA: Diagnosis not present

## 2023-06-17 DIAGNOSIS — M6281 Muscle weakness (generalized): Secondary | ICD-10-CM | POA: Insufficient documentation

## 2023-06-17 DIAGNOSIS — R293 Abnormal posture: Secondary | ICD-10-CM | POA: Insufficient documentation

## 2023-06-17 NOTE — Patient Instructions (Signed)
RE-ALIGNMENT ROUTINE EXERCISES-OSTEOPROROSIS BASIC FOR POSTURAL CORRECTION   RE-ALIGNMENT Tips BENEFITS: 1.It helps to re-align the curves of the back and improve standing posture. 2.It allows the back muscles to rest and strengthen in preparation for more activity. FREQUENCY: Daily, even after weeks, months and years of more advanced exercises. START: 1.All exercises start in the same position: lying on the back, arms resting on the supporting surface, palms up and slightly away from the body, backs of hands down, knees bent, feet flat. 2.The head, neck, arms, and legs are supported according to specific instructions of your therapist. Copyright  [employer]. All rights reserved.    1. Decompression Exercise: Basic.   Takes compression off the vertebral bodies; increases tolerance for lying on the back; helps relieve back pain   Lie on back on firm surface, knees bent, feet flat, arms turned up, out to sides (~35 degrees). Head neck and arms supported as necessary. Time _5-15__ minutes. Surface: floor     2. Shoulder Press  Strengthens upper back extensors and scapular retractors.   Press both shoulders down. Hold _2-3__ seconds. Repeat _3-5__ times. Surface: floor        3. Head Press With Chin Tuck  Strengthens neck extensors   Tuck chin SLIGHTLY toward chest, keep mouth closed. Feel weight on back of head. Increase weight by pressing head down. Hold _2-3__ seconds. Relax. Repeat 3-5___ times. Surface: floor     4. Leg Lengthener: stretches quadratus lumborum and hip flexors.  Strengthens quads and ankle dorsiflexors.  Leg Lengthener: Full    Straighten one leg. Pull toes AND forefoot toward knee, extend heel. Lengthen leg by pulling pelvis away from ribs. Hold __5_ seconds. Relax. Repeat 1 time. Re-bend knee. Do other leg. Each leg __5_ times. Surface: floor    Leg Lengthener / Leg Press Combo: Single Leg    Straighten one leg down to floor. Pull toes AND forefoot  toward knee; extend heel. Lengthen leg by pulling pelvis away from ribs. Press leg down. DO NOT BEND KNEE. Hold _5__ seconds. Relax leg. Repeat exercise 1 time. Relax leg. Re-bend knee. Repeat with other leg. Do 5 times    

## 2023-06-17 NOTE — Therapy (Signed)
 OUTPATIENT PHYSICAL THERAPY THORACOLUMBAR EVALUATION   Patient Name: Shelby Lin MRN: [id number] DOB:1940-09-03, 83 y.o., female Today's Date: 06/17/2023  END OF SESSION:  PT End of Session - 06/17/23 1443     Visit Number 1    Date for PT Re-Evaluation 08/12/23    Authorization Type [employer] will submit    Progress Note Due on Visit 10    PT Start Time 1446    PT Stop Time 1530    PT Time Calculation (min) 44 min    Activity Tolerance Patient tolerated treatment well             Past Medical History:  Diagnosis Date   Anxiety    Arthritis    Asthma in child    Atherosclerotic heart disease of native coronary artery without angina pectoris    Cataracts, bilateral    Combined hyperlipidemia    Constipation, unspecified    COPD (chronic obstructive pulmonary disease) (HCC)    Coronary artery spasm (HCC)    Diabetic neuropathy (HCC)    DM (diabetes mellitus) with complications (HCC)    Dupuytren's contracture    Dyspepsia    Familial tremor    Fatigue    Fibromyalgia syndrome    GERD (gastroesophageal reflux disease)    Hashimoto's thyroiditis    Hiatal hernia    History of colon polyps    Hx of Hashimoto thyroiditis    Hypercholesteremia    Hypertension    Hypothyroidism, acquired, autoimmune    IBS (irritable bowel syndrome)    Meniere's disease of left ear    Migraine without aura and without status migrainosus, not intractable    Multinodular goiter (nontoxic)    Nonexudative macular degeneration    Raynaud's syndrome without gangrene    Situational stress    Tachycardia    Thyroiditis, autoimmune    Ulcerative colitis, unspecified, without complications (HCC)    Vitamin B 12 deficiency    Vitamin D  deficiency    Wears glasses    Past Surgical History:  Procedure Laterality Date   ANAL RECTAL MANOMETRY Bilateral 11/26/2022   Procedure: ANO RECTAL MANOMETRY;  Surgeon: Evangeline Hilts, MD;  Location: [facility];  Service: Gastroenterology;   Laterality: Bilateral;   BREAST EXCISIONAL BIOPSY Left    benign   BREAST EXCISIONAL BIOPSY Right    milk gland removed   BREAST LUMPECTOMY Left 1990   benign   BREAST LUMPECTOMY WITH NEEDLE LOCALIZATION Right 02/28/2013   Procedure: RIGHT BREAST NEEDLE LOCALIZATION LUMPECTOMY ;  Surgeon: Quitman Bucy, MD;  Location: [facility];  Service: General;  Laterality: Right;   CHOLECYSTECTOMY  2008   lapcholi   DILATION AND CURETTAGE OF UTERUS     LEFT HEART CATHETERIZATION WITH CORONARY ANGIOGRAM N/A 01/26/2013   Procedure: LEFT HEART CATHETERIZATION WITH CORONARY ANGIOGRAM;  Surgeon: Dorothye Gathers, MD;  Location: [facility];  Service: Cardiovascular;  Laterality: N/A;   PARTIAL HYSTERECTOMY  1971   TONSILLECTOMY     Patient Active Problem List   Diagnosis Date Noted   Strain of left latissimus dorsi muscle 04/22/2023   Costal margin pain 04/17/2023   URI (upper respiratory infection) 02/26/2021   COVID-19 virus detected 03/25/2019   Physical deconditioning 02/23/2019   Gastroesophageal reflux disease 10/20/2018   Healthcare maintenance 10/20/2018   Bronchiectasis without complication (HCC) 04/22/2018   Nocturnal hypoxemia due to emphysema (HCC) 04/22/2018   Prediabetes 04/10/2018   Stage 3 severe COPD by GOLD classification (HCC) 03/01/2018  Overweight 10/13/2016   Ear fullness, left 09/08/2016   Goiter 11/02/2013   Hoarseness 11/02/2013   Action tremor 05/18/2013   Abnormal stress test 01/26/2013   Angina decubitus (HCC) 01/26/2013   Chronic tension headaches 04/06/2012   Chronic night sweats 04/06/2012   Meniere's disease 04/06/2012   Chronic leg pain 04/06/2012   Hearing loss, sensorineural 08/25/2011   Hypothyroidism, acquired, autoimmune    Fibromyalgia syndrome    Thyroiditis, autoimmune    Multinodular goiter (nontoxic)    Tachycardia    Familial tremor    Coronary artery spasm (HCC)    Dyspepsia    Fatigue    Combined hyperlipidemia     Dupuytren's contracture    Other disturbances of aromatic amino-acid metabolism 06/18/2010    PCP: Faustina Hood MD  REFERRING PROVIDER: Faustina Hood MD  REFERRING DIAG: G89.29 other chronic pain; M54.50 low back pain, R07.89 costochondral pain  Rationale for Evaluation and Treatment: Rehabilitation  THERAPY DIAG:  Other low back pain  Rib pain on left side  Muscle weakness (generalized)  ONSET DATE: 8 weeks ago (February)  SUBJECTIVE:                                                                                                                                                                                           SUBJECTIVE STATEMENT: Started left lower back lateral 6-8 weeks ago.  Negative x-ray for lung involvement see CT results below.  Diagnosed with "costochondritis".  Took a 5 day round of prednisone , no change, took another stronger round and felt some better but when stopped pain returned.  Now pain has moved up higher in left mid back.  No recent fall or known reason for cause of this.    Saw chiro 5x and he would crack my back not much relief.  One day relief maybe.  Sleeping OK.  Uses oxygen  at night.  PERTINENT HISTORY:  Just finished pelvic floor PT with Miki Alert (constipation) Osteopenia lumbar spine per DEXA report IBS, colitis, partial hysterectomy 1971, cholecystectomy 2008, Severe COPD stage 3 with hypoxemia in exercise; fibromyalgia  PAIN:   Are you having pain? Yes NPRS scale: 1/10 but Sunday it was > 10/10 "I cried I was in so much pain" Pain location: left mid back  Pain orientation: Left  PAIN TYPE: aching and throbbing Pain description: intermittent  Aggravating factors: repetitious activity folding clothes, towels, unload dishwasher; prolonged walking; sometimes cooking Relieving factors: Tylenol  , Salonpas, Aspercreme, lidocaine , heat, ice; lying down on my back    PRECAUTIONS: Stage 3 severe COPD with exercise hypoxemia; spinal  osteopenia    WEIGHT BEARING RESTRICTIONS: No  FALLS:  Has patient fallen in last 6 months? No Reports almost 2 years ago she fell through the deck at her daughter's house and has had a fear of falling ever since  LIVING ENVIRONMENT: Lives with: lives with their spouse Lives in: House/apartment Stairs: No  OCCUPATION: retired  PLOF: Independent  PATIENT GOALS: I don't want this pain anymore; be able to hold new great grandbaby   OBJECTIVE:  Note: Objective measures were completed at Evaluation unless otherwise noted.  DIAGNOSTIC FINDINGS:  CT chest February   IMPRESSION: 1. No evidence of interstitial lung disease. 2. Small to moderate hiatal hernia. 3.  Emphysema (ICD10-J43.9). 4. Aortic atherosclerosis (ICD10-I70.0). Coronary artery calcification. 5. Enlarged pulmonic trunk, indicative of pulmonary arterial hypertension.    DEXA March 2024 T-score AP Spine  L1-L4 (L3) 05/19/2022    81.9         -1.5    0.995 g/cm2 AP Spine  L1-L4 (L3) 02/12/2016    75.6         -1.6    0.994 g/cm2   DualFemur Total Left 05/19/2022 81.9 -1.2 0.851 g/cm2 * DualFemur Total Left 02/12/2016    75.6         -1.0    0.887 g/cm2   DualFemur Total Mean 05/19/2022 81.9 -1.1 0.863 g/cm2 * DualFemur Total Mean 02/12/2016    75.6         -0.8    0.906 g/cm2   World Health Organization [facility]) criteria for post-menopausal, Caucasian Women: Normal       T-score at or above -1 SD Osteopenia   T-score between -1 and -2.5 SD Osteoporosis T-score at or below -2.5 SD  PATIENT SURVEYS:  Modified Oswestry 46%   COGNITION: Overall cognitive status: Within functional limits for tasks assessed      POSTURE: increased thoracic kyphosis (moderate)  PALPATION: Tenderness thoracic region spinous processes, left thoracic paraspinals and left posterior ribs  LUMBAR ROM:   AROM eval  Flexion Guarded and slow but able to pick up small object from floor; increased kyphosis   Extension 5  Right  lateral flexion 15 not painful  Left lateral flexion 15 not painful  Right rotation   Left rotation    (Blank rows = not tested)  TRUNK STRENGTH:  Decreased activation of transverse abdominus muscles; abdominals 4-/5; decreased activation of lumbar multifidi; trunk extensors 4-/5   LOWER EXTREMITY ROM:  single knee to chest produced anterior rib pain;  Les WFLS   FUNCTIONAL TESTS:  Able to rise 1x sit to stand without UE assist but slow and painful  GAIT: Comments: no assistive device  TREATMENT DATE: 06/17/23    Evaluation Supine decompression series per patient instructions  Log rolling to get in/out of bed                                                                                                                             PATIENT EDUCATION:  Education details:  Educated patient on anatomy and physiology of current symptoms, prognosis, plan of care as well as initial self care strategies to promote recovery Person educated: Patient Education method: Explanation Education comprehension: verbalized understanding  HOME EXERCISE PROGRAM: Supine decompression series per patient instruction handout  ASSESSMENT:  CLINICAL IMPRESSION: Patient is a 83 y.o. female who was seen today for physical therapy evaluation and treatment for left mid back pain. Symptoms began about 8 weeks ago for no apparent reason.  She is tender along thoracic spinous processes, paraspinals and left sided mid to lower ribs.  No increased pain with coughing or sneezing.  Pain is aggravated with folding clothes/towels and loading/unloading the dishwasher as well as with prolonged walking.  Increased thoracic kyphosis noted and DEXA scan T score 1 year ago indicates osteopenia.  Pain with sit to stand and getting on/off the treatment table. Decreased trunk/core strength and decreased spinal extension noted.  Initiated spinal decompression type exercises in supine with a good initial response.  She would  benefit from skilled PT to address these impairments in order to perform her housework and be able to hold her new great grandbaby who will be born in the next few weeks.    OBJECTIVE IMPAIRMENTS: decreased activity tolerance, decreased mobility, difficulty walking, decreased ROM, decreased strength, impaired perceived functional ability, postural dysfunction, and pain.   ACTIVITY LIMITATIONS: carrying, lifting, bending, standing, and bed mobility  PARTICIPATION LIMITATIONS: meal prep, cleaning, laundry, and community activity  PERSONAL FACTORS: 1-2 comorbidities: COPD, osteopenia, fibromyalgia  are also affecting patient's functional outcome.   REHAB POTENTIAL: Good  CLINICAL DECISION MAKING: Stable/uncomplicated  EVALUATION COMPLEXITY: Low   GOALS: Goals reviewed with patient? Yes  SHORT TERM GOALS: Target date: 07/15/2023   The patient will demonstrate knowledge of basic self care strategies and exercises to promote healing  Baseline: Goal status: INITIAL  2.  The patient will demonstrate good hip hinge technique (and less spinal flexion) for functional movement/bending Baseline:  Goal status: INITIAL  3.  The patient will demonstrate good technique with getting in/out of bed (log rolling) Baseline:  Goal status: INITIAL      LONG TERM GOALS: Target date: 08/12/2023   The patient will be independent in a safe self progression of a home exercise program to promote further recovery of function  Baseline:  Goal status: INITIAL  2.  Be able to lift/hold/carry great grandbaby Baseline:  Goal status: INITIAL  3.  Patient will be able to fold clothes/towels with greater ease pain level 4/10 Baseline:  Goal status: INITIAL  4.  Patient will be able to load and unload the dishwasher with greater ease with pain level 4/10 Baseline:  Goal status: INITIAL  5.  Modified Oswestry score improved to 34% indicating improved function with less pain Baseline:  Goal status:  INITIAL    PLAN:  PT FREQUENCY: 2x/week  PT DURATION: 8 weeks  PLANNED INTERVENTIONS: 97164- PT Re-evaluation, 97110-Therapeutic exercises, 97530- Therapeutic activity, 97112- Neuromuscular re-education, 97535- Self Care, 45409- Manual therapy, [id number]- Aquatic Therapy, W1191- Electrical stimulation (unattended), [id number]- Electrical stimulation (manual), [id number]- Ultrasound, 47829- Ionotophoresis 4mg /ml Dexamethasone, Patient/Family education, Taping, Dry Needling, Spinal mobilization, Cryotherapy, and Moist heat.  PLAN FOR NEXT SESSION: assess response to supine decompression ex's;  try supine scapular stabilization ex's with yellow band; scapular retraction, gentle thoracic extension  Darien Eden, PT 06/17/23 7:31 PM Phone: [phone] Fax: [phone]

## 2023-06-22 ENCOUNTER — Ambulatory Visit

## 2023-06-22 DIAGNOSIS — M6281 Muscle weakness (generalized): Secondary | ICD-10-CM | POA: Diagnosis not present

## 2023-06-22 DIAGNOSIS — R0781 Pleurodynia: Secondary | ICD-10-CM

## 2023-06-22 DIAGNOSIS — R293 Abnormal posture: Secondary | ICD-10-CM

## 2023-06-22 DIAGNOSIS — M5459 Other low back pain: Secondary | ICD-10-CM | POA: Diagnosis not present

## 2023-06-22 NOTE — Therapy (Addendum)
 OUTPATIENT PHYSICAL THERAPY TREATMENT   Patient Name: Shelby Lin MRN: [id number] DOB:[birth year], 83 y.o., female Today's Date: 06/22/2023  END OF SESSION:  PT End of Session - 06/22/23 1533     Visit Number 2    Date for PT Re-Evaluation 08/12/23    Authorization Type [employer] Medicare-approved 6 visits 06/17/2023 - 07/01/2023    Authorization - Visit Number 2    Authorization - Number of Visits 6    Progress Note Due on Visit 10    PT Start Time 1446    PT Stop Time 1529    PT Time Calculation (min) 43 min    Activity Tolerance Patient tolerated treatment well    Behavior During Therapy WFL for tasks assessed/performed              Past Medical History:  Diagnosis Date   Anxiety    Arthritis    Asthma in child    Atherosclerotic heart disease of native coronary artery without angina pectoris    Cataracts, bilateral    Combined hyperlipidemia    Constipation, unspecified    COPD (chronic obstructive pulmonary disease) (HCC)    Coronary artery spasm (HCC)    Diabetic neuropathy (HCC)    DM (diabetes mellitus) with complications (HCC)    Dupuytren's contracture    Dyspepsia    Familial tremor    Fatigue    Fibromyalgia syndrome    GERD (gastroesophageal reflux disease)    Hashimoto's thyroiditis    Hiatal hernia    History of colon polyps    Hx of Hashimoto thyroiditis    Hypercholesteremia    Hypertension    Hypothyroidism, acquired, autoimmune    IBS (irritable bowel syndrome)    Meniere's disease of left ear    Migraine without aura and without status migrainosus, not intractable    Multinodular goiter (nontoxic)    Nonexudative macular degeneration    Raynaud's syndrome without gangrene    Situational stress    Tachycardia    Thyroiditis, autoimmune    Ulcerative colitis, unspecified, without complications (HCC)    Vitamin B 12 deficiency    Vitamin D  deficiency    Wears glasses    Past Surgical History:  Procedure Laterality Date   ANAL RECTAL  MANOMETRY Bilateral 11/26/2022   Procedure: ANO RECTAL MANOMETRY;  Surgeon: Evangeline Hilts, MD;  Location: [facility];  Service: Gastroenterology;  Laterality: Bilateral;   BREAST EXCISIONAL BIOPSY Left    benign   BREAST EXCISIONAL BIOPSY Right    milk gland removed   BREAST LUMPECTOMY Left 1990   benign   BREAST LUMPECTOMY WITH NEEDLE LOCALIZATION Right 02/28/2013   Procedure: RIGHT BREAST NEEDLE LOCALIZATION LUMPECTOMY ;  Surgeon: Quitman Bucy, MD;  Location: [facility];  Service: General;  Laterality: Right;   CHOLECYSTECTOMY  2008   lapcholi   DILATION AND CURETTAGE OF UTERUS     LEFT HEART CATHETERIZATION WITH CORONARY ANGIOGRAM N/A 01/26/2013   Procedure: LEFT HEART CATHETERIZATION WITH CORONARY ANGIOGRAM;  Surgeon: Dorothye Gathers, MD;  Location: [facility];  Service: Cardiovascular;  Laterality: N/A;   PARTIAL HYSTERECTOMY  1971   TONSILLECTOMY     Patient Active Problem List   Diagnosis Date Noted   Strain of left latissimus dorsi muscle 04/22/2023   Costal margin pain 04/17/2023   URI (upper respiratory infection) 02/26/2021   COVID-19 virus detected 03/25/2019   Physical deconditioning 02/23/2019   Gastroesophageal reflux disease 10/20/2018   Healthcare maintenance 10/20/2018   Bronchiectasis  without complication (HCC) 04/22/2018   Nocturnal hypoxemia due to emphysema (HCC) 04/22/2018   Prediabetes 04/10/2018   Stage 3 severe COPD by GOLD classification (HCC) 03/01/2018   Overweight 10/13/2016   Ear fullness, left 09/08/2016   Goiter 11/02/2013   Hoarseness 11/02/2013   Action tremor 05/18/2013   Abnormal stress test 01/26/2013   Angina decubitus (HCC) 01/26/2013   Chronic tension headaches 04/06/2012   Chronic night sweats 04/06/2012   Meniere's disease 04/06/2012   Chronic leg pain 04/06/2012   Hearing loss, sensorineural 08/25/2011   Hypothyroidism, acquired, autoimmune    Fibromyalgia syndrome    Thyroiditis, autoimmune     Multinodular goiter (nontoxic)    Tachycardia    Familial tremor    Coronary artery spasm (HCC)    Dyspepsia    Fatigue    Combined hyperlipidemia    Dupuytren's contracture    Other disturbances of aromatic amino-acid metabolism 06/18/2010    PCP: Faustina Hood MD  REFERRING PROVIDER: Faustina Hood MD  REFERRING DIAG: G89.29 other chronic pain; M54.50 low back pain, R07.89 costochondral pain  Rationale for Evaluation and Treatment: Rehabilitation  THERAPY DIAG:  Other low back pain  Rib pain on left side  Muscle weakness (generalized)  Abnormal posture  ONSET DATE: 8 weeks ago (February)  SUBJECTIVE:                                                                                                                                                                                           SUBJECTIVE STATEMENT: I had 8/10 pain 2 hours ago. I took Tylenol  and used ice and it is better now.  I didn't do anything unusual.     From Eval: Started left lower back lateral 6-8 weeks ago.  Negative x-ray for lung involvement see CT results below.  Diagnosed with "costochondritis".  Took a 5 day round of prednisone , no change, took another stronger round and felt some better but when stopped pain returned.  Now pain has moved up higher in left mid back.  No recent fall or known reason for cause of this.    Saw chiro 5x and he would crack my back not much relief.  One day relief maybe.  Sleeping OK.  Uses oxygen  at night.  PERTINENT HISTORY:  Just finished pelvic floor PT with [facility] (constipation) Osteopenia lumbar spine per DEXA report IBS, colitis, partial hysterectomy 1971, cholecystectomy 2008, Severe COPD stage 3 with hypoxemia in exercise; fibromyalgia  PAIN: 06/22/23:   Are you having pain? Yes NPRS scale: 8/10 earlier today, 0/10 now  Pain location: left mid back  Pain orientation: Left  PAIN TYPE: aching  and throbbing Pain description: intermittent  Aggravating factors:  repetitious activity folding clothes, towels, unload dishwasher; prolonged walking; sometimes cooking Relieving factors: Tylenol  , Salonpas, Aspercreme, lidocaine , heat, ice; lying down on my back    PRECAUTIONS: Stage 3 severe COPD with exercise hypoxemia; spinal osteopenia    WEIGHT BEARING RESTRICTIONS: No  FALLS:  Has patient fallen in last 6 months? No Reports almost 2 years ago she fell through the deck at her daughter's house and has had a fear of falling ever since  LIVING ENVIRONMENT: Lives with: lives with their spouse Lives in: House/apartment Stairs: No  OCCUPATION: retired  PLOF: Independent  PATIENT GOALS: I don't want this pain anymore; be able to hold new great grandbaby   OBJECTIVE:  Note: Objective measures were completed at Evaluation unless otherwise noted.  DIAGNOSTIC FINDINGS:  CT chest February   IMPRESSION: 1. No evidence of interstitial lung disease. 2. Small to moderate hiatal hernia. 3.  Emphysema (ICD10-J43.9). 4. Aortic atherosclerosis (ICD10-I70.0). Coronary artery calcification. 5. Enlarged pulmonic trunk, indicative of pulmonary arterial hypertension.    DEXA March 2024 T-score AP Spine  L1-L4 (L3) 05/19/2022    81.9         -1.5    0.995 g/cm2 AP Spine  L1-L4 (L3) 02/12/2016    75.6         -1.6    0.994 g/cm2   DualFemur Total Left 05/19/2022 81.9 -1.2 0.851 g/cm2 * DualFemur Total Left 02/12/2016    75.6         -1.0    0.887 g/cm2   DualFemur Total Mean 05/19/2022 81.9 -1.1 0.863 g/cm2 * DualFemur Total Mean 02/12/2016    75.6         -0.8    0.906 g/cm2   World Health Organization [facility]) criteria for post-menopausal, Caucasian Women: Normal       T-score at or above -1 SD Osteopenia   T-score between -1 and -2.5 SD Osteoporosis T-score at or below -2.5 SD  PATIENT SURVEYS:  Modified Oswestry 46%   COGNITION: Overall cognitive status: Within functional limits for tasks assessed      POSTURE: increased thoracic kyphosis  (moderate)  PALPATION: Tenderness thoracic region spinous processes, left thoracic paraspinals and left posterior ribs  LUMBAR ROM:   AROM eval  Flexion Guarded and slow but able to pick up small object from floor; increased kyphosis   Extension 5  Right lateral flexion 15 not painful  Left lateral flexion 15 not painful  Right rotation   Left rotation    (Blank rows = not tested)  TRUNK STRENGTH:  Decreased activation of transverse abdominus muscles; abdominals 4-/5; decreased activation of lumbar multifidi; trunk extensors 4-/5   LOWER EXTREMITY ROM:  single knee to chest produced anterior rib pain;  Les WFLS   FUNCTIONAL TESTS:  Able to rise 1x sit to stand without UE assist but slow and painful  GAIT: Comments: no assistive device  TREATMENT DATE:   06/22/23    NuStep: Level 5x 6 minutes- PT present to discuss progress  Decompression series- x5 reps each- tactile and verbal cues to reduce scapular elevation Supine ER with yellow band 2x10 Horizontal abduction 2x10 with yellow band Ball roll outs: 3 ways x5 each  Thoracic extension in chair with pool noodle behind back x10 Seated row x10  Shoulder flexion: 1# x10  06/17/23    Evaluation Supine decompression series per patient instructions  Log rolling to get in/out of bed  PATIENT EDUCATION:  Education details: Educated patient on anatomy and physiology of current symptoms, prognosis, plan of care as well as initial self care strategies to promote recovery Person educated: Patient Education method: Explanation Education comprehension: verbalized understanding  HOME EXERCISE PROGRAM: Supine decompression series per patient instruction handout  ASSESSMENT:  CLINICAL IMPRESSION: First time follow-up after evaluation.  She had 8/10 pain earlier today and this has resolved with medication and  ice. She reports that she is doing her HEP when she is not hurting and not doing them when she is.  PT encouraged her to do them daily and when she has pain <5/10 to maintain consistency.  PT monitored pt throughout session and she did all without increased pain.  She is challenged by supine theraband. She would benefit from skilled PT to address these impairments in order to perform her housework and be able to hold her new great grandbaby who will be born in the next few weeks.    OBJECTIVE IMPAIRMENTS: decreased activity tolerance, decreased mobility, difficulty walking, decreased ROM, decreased strength, impaired perceived functional ability, postural dysfunction, and pain.   ACTIVITY LIMITATIONS: carrying, lifting, bending, standing, and bed mobility  PARTICIPATION LIMITATIONS: meal prep, cleaning, laundry, and community activity  PERSONAL FACTORS: 1-2 comorbidities: COPD, osteopenia, fibromyalgia  are also affecting patient's functional outcome.   REHAB POTENTIAL: Good  CLINICAL DECISION MAKING: Stable/uncomplicated  EVALUATION COMPLEXITY: Low   GOALS: Goals reviewed with patient? Yes  SHORT TERM GOALS: Target date: 07/15/2023   The patient will demonstrate knowledge of basic self care strategies and exercises to promote healing  Baseline: Goal status: INITIAL  2.  The patient will demonstrate good hip hinge technique (and less spinal flexion) for functional movement/bending Baseline:  Goal status: INITIAL  3.  The patient will demonstrate good technique with getting in/out of bed (log rolling) Baseline:  Goal status: INITIAL      LONG TERM GOALS: Target date: 08/12/2023   The patient will be independent in a safe self progression of a home exercise program to promote further recovery of function  Baseline:  Goal status: INITIAL  2.  Be able to lift/hold/carry great grandbaby Baseline:  Goal status: INITIAL  3.  Patient will be able to fold clothes/towels with  greater ease pain level 4/10 Baseline:  Goal status: INITIAL  4.  Patient will be able to load and unload the dishwasher with greater ease with pain level 4/10 Baseline:  Goal status: INITIAL  5.  Modified Oswestry score improved to 34% indicating improved function with less pain Baseline:  Goal status: INITIAL    PLAN:  PT FREQUENCY: 2x/week  PT DURATION: 8 weeks  PLANNED INTERVENTIONS: 97164- PT Re-evaluation, 97110-Therapeutic exercises, 97530- Therapeutic activity, 97112- Neuromuscular re-education, 97535- Self Care, 16109- Manual therapy, [id number]- Aquatic Therapy, U0454- Electrical stimulation (unattended), [id number]- Electrical stimulation (manual), [id number]- Ultrasound, 09811- Ionotophoresis 4mg /ml Dexamethasone, Patient/Family education, Taping, Dry Needling, Spinal mobilization, Cryotherapy, and Moist heat.  PLAN FOR NEXT SESSION:   add supine scapular stabilization ex's with yellow band to HEP; scapular retraction, gentle thoracic extension, upper body strength  Luella Sager, PT 06/22/23 4:03 PM    [facility] [address] [place], [place] [address] Phone # [phone] Fax [phone]

## 2023-06-25 ENCOUNTER — Ambulatory Visit: Attending: Family Medicine | Admitting: Physical Therapy

## 2023-06-25 DIAGNOSIS — M5459 Other low back pain: Secondary | ICD-10-CM

## 2023-06-25 DIAGNOSIS — R0781 Pleurodynia: Secondary | ICD-10-CM | POA: Insufficient documentation

## 2023-06-25 DIAGNOSIS — R293 Abnormal posture: Secondary | ICD-10-CM | POA: Diagnosis not present

## 2023-06-25 DIAGNOSIS — M6281 Muscle weakness (generalized): Secondary | ICD-10-CM | POA: Diagnosis not present

## 2023-06-25 DIAGNOSIS — R0789 Other chest pain: Secondary | ICD-10-CM

## 2023-06-25 NOTE — Therapy (Signed)
 OUTPATIENT PHYSICAL THERAPY TREATMENT   Patient Name: Shelby Lin MRN: [id number] DOB:Jul 08, 1940, 83 y.o., female Today's Date: 06/25/2023  END OF SESSION:  PT End of Session - 06/25/23 1103     Visit Number 3    Number of Visits 6    Date for PT Re-Evaluation 08/12/23    Authorization Type [employer] Medicare-approved 6 visits 06/17/2023 - 07/01/2023    Authorization - Visit Number 3    Authorization - Number of Visits 6    Progress Note Due on Visit 10    PT Start Time 1100    PT Stop Time 1140    PT Time Calculation (min) 40 min    Activity Tolerance Patient tolerated treatment well              Past Medical History:  Diagnosis Date   Anxiety    Arthritis    Asthma in child    Atherosclerotic heart disease of native coronary artery without angina pectoris    Cataracts, bilateral    Combined hyperlipidemia    Constipation, unspecified    COPD (chronic obstructive pulmonary disease) (HCC)    Coronary artery spasm (HCC)    Diabetic neuropathy (HCC)    DM (diabetes mellitus) with complications (HCC)    Dupuytren's contracture    Dyspepsia    Familial tremor    Fatigue    Fibromyalgia syndrome    GERD (gastroesophageal reflux disease)    Hashimoto's thyroiditis    Hiatal hernia    History of colon polyps    Hx of Hashimoto thyroiditis    Hypercholesteremia    Hypertension    Hypothyroidism, acquired, autoimmune    IBS (irritable bowel syndrome)    Meniere's disease of left ear    Migraine without aura and without status migrainosus, not intractable    Multinodular goiter (nontoxic)    Nonexudative macular degeneration    Raynaud's syndrome without gangrene    Situational stress    Tachycardia    Thyroiditis, autoimmune    Ulcerative colitis, unspecified, without complications (HCC)    Vitamin B 12 deficiency    Vitamin D  deficiency    Wears glasses    Past Surgical History:  Procedure Laterality Date   ANAL RECTAL MANOMETRY Bilateral 11/26/2022    Procedure: ANO RECTAL MANOMETRY;  Surgeon: Evangeline Hilts, MD;  Location: [facility];  Service: Gastroenterology;  Laterality: Bilateral;   BREAST EXCISIONAL BIOPSY Left    benign   BREAST EXCISIONAL BIOPSY Right    milk gland removed   BREAST LUMPECTOMY Left 1990   benign   BREAST LUMPECTOMY WITH NEEDLE LOCALIZATION Right 02/28/2013   Procedure: RIGHT BREAST NEEDLE LOCALIZATION LUMPECTOMY ;  Surgeon: Quitman Bucy, MD;  Location: [facility];  Service: General;  Laterality: Right;   CHOLECYSTECTOMY  2008   lapcholi   DILATION AND CURETTAGE OF UTERUS     LEFT HEART CATHETERIZATION WITH CORONARY ANGIOGRAM N/A 01/26/2013   Procedure: LEFT HEART CATHETERIZATION WITH CORONARY ANGIOGRAM;  Surgeon: Dorothye Gathers, MD;  Location: [facility];  Service: Cardiovascular;  Laterality: N/A;   PARTIAL HYSTERECTOMY  1971   TONSILLECTOMY     Patient Active Problem List   Diagnosis Date Noted   Strain of left latissimus dorsi muscle 04/22/2023   Costal margin pain 04/17/2023   URI (upper respiratory infection) 02/26/2021   COVID-19 virus detected 03/25/2019   Physical deconditioning 02/23/2019   Gastroesophageal reflux disease 10/20/2018   Healthcare maintenance 10/20/2018   Bronchiectasis without complication (HCC)  04/22/2018   Nocturnal hypoxemia due to emphysema (HCC) 04/22/2018   Prediabetes 04/10/2018   Stage 3 severe COPD by GOLD classification (HCC) 03/01/2018   Overweight 10/13/2016   Ear fullness, left 09/08/2016   Goiter 11/02/2013   Hoarseness 11/02/2013   Action tremor 05/18/2013   Abnormal stress test 01/26/2013   Angina decubitus (HCC) 01/26/2013   Chronic tension headaches 04/06/2012   Chronic night sweats 04/06/2012   Meniere's disease 04/06/2012   Chronic leg pain 04/06/2012   Hearing loss, sensorineural 08/25/2011   Hypothyroidism, acquired, autoimmune    Fibromyalgia syndrome    Thyroiditis, autoimmune    Multinodular goiter (nontoxic)     Tachycardia    Familial tremor    Coronary artery spasm (HCC)    Dyspepsia    Fatigue    Combined hyperlipidemia    Dupuytren's contracture    Other disturbances of aromatic amino-acid metabolism 06/18/2010    PCP: Faustina Hood MD  REFERRING PROVIDER: Faustina Hood MD  REFERRING DIAG: G89.29 other chronic pain; M54.50 low back pain, R07.89 costochondral pain  Rationale for Evaluation and Treatment: Rehabilitation  THERAPY DIAG:  Other low back pain  Rib pain on left side  Muscle weakness (generalized)  ONSET DATE: 8 weeks ago (February)  SUBJECTIVE:                                                                                                                                                                                           SUBJECTIVE STATEMENT: The last 2 days have been good.  I've been doing my ex's. I did have some pain on Tuesday and took 2 Tylenol  and that took care of it.  No bad twinges since then.  I would like to vacumn today.    From Eval: Started left lower back lateral 6-8 weeks ago.  Negative x-ray for lung involvement see CT results below.  Diagnosed with "costochondritis".  Took a 5 day round of prednisone , no change, took another stronger round and felt some better but when stopped pain returned.  Now pain has moved up higher in left mid back.  No recent fall or known reason for cause of this.    Saw chiro 5x and he would crack my back not much relief.  One day relief maybe.  Sleeping OK.  Uses oxygen  at night.  PERTINENT HISTORY:  Just finished pelvic floor PT with [facility] (constipation) Osteopenia lumbar spine per DEXA report IBS, colitis, partial hysterectomy 1971, cholecystectomy 2008, Severe COPD stage 3 with hypoxemia in exercise; fibromyalgia  PAIN: 06/25/23:   Are you having pain? Yes NPRS scale:1/10 Pain location: left mid back  Pain  orientation: Left  PAIN TYPE: aching and throbbing Pain description: intermittent  Aggravating factors:  repetitious activity folding clothes, towels, unload dishwasher; prolonged walking; sometimes cooking Relieving factors: Tylenol  , Salonpas, Aspercreme, lidocaine , heat, ice; lying down on my back    PRECAUTIONS: Stage 3 severe COPD with exercise hypoxemia; spinal osteopenia    WEIGHT BEARING RESTRICTIONS: No  FALLS:  Has patient fallen in last 6 months? No Reports almost 2 years ago she fell through the deck at her daughter's house and has had a fear of falling ever since  LIVING ENVIRONMENT: Lives with: lives with their spouse Lives in: House/apartment Stairs: No  OCCUPATION: retired  PLOF: Independent  PATIENT GOALS: I don't want this pain anymore; be able to hold new great grandbaby   OBJECTIVE:  Note: Objective measures were completed at Evaluation unless otherwise noted.  DIAGNOSTIC FINDINGS:  CT chest February   IMPRESSION: 1. No evidence of interstitial lung disease. 2. Small to moderate hiatal hernia. 3.  Emphysema (ICD10-J43.9). 4. Aortic atherosclerosis (ICD10-I70.0). Coronary artery calcification. 5. Enlarged pulmonic trunk, indicative of pulmonary arterial hypertension.    DEXA March 2024 T-score AP Spine  L1-L4 (L3) 05/19/2022    81.9         -1.5    0.995 g/cm2 AP Spine  L1-L4 (L3) 02/12/2016    75.6         -1.6    0.994 g/cm2   DualFemur Total Left 05/19/2022 81.9 -1.2 0.851 g/cm2 * DualFemur Total Left 02/12/2016    75.6         -1.0    0.887 g/cm2   DualFemur Total Mean 05/19/2022 81.9 -1.1 0.863 g/cm2 * DualFemur Total Mean 02/12/2016    75.6         -0.8    0.906 g/cm2   World Health Organization [facility]) criteria for post-menopausal, Caucasian Women: Normal       T-score at or above -1 SD Osteopenia   T-score between -1 and -2.5 SD Osteoporosis T-score at or below -2.5 SD  PATIENT SURVEYS:  Modified Oswestry 46%   COGNITION: Overall cognitive status: Within functional limits for tasks assessed      POSTURE: increased thoracic kyphosis  (moderate)  PALPATION: Tenderness thoracic region spinous processes, left thoracic paraspinals and left posterior ribs  LUMBAR ROM:   AROM eval  Flexion Guarded and slow but able to pick up small object from floor; increased kyphosis   Extension 5  Right lateral flexion 15 not painful  Left lateral flexion 15 not painful  Right rotation   Left rotation    (Blank rows = not tested)  TRUNK STRENGTH:  Decreased activation of transverse abdominus muscles; abdominals 4-/5; decreased activation of lumbar multifidi; trunk extensors 4-/5   LOWER EXTREMITY ROM:  single knee to chest produced anterior rib pain;  Les WFLS   FUNCTIONAL TESTS:  Able to rise 1x sit to stand without UE assist but slow and painful  GAIT: Comments: no assistive device  TREATMENT DATE:  06/25/23    Decompression leg lengthener leading with heel x5 reps each- tactile and verbal cues to reduce scapular elevation Supine yellow band overhead 10x (added to HEP) Supine yellow band diagonals 5x to each side (added to HEP) Supine ER with yellow band x10 (added to HEP) Horizontal abduction x10 with yellow band (added to HEP) Thoracic extension in chair with pool noodle behind back 10 (added to HEP) Standing row red band x10 (added to HEP) Standing in doorway UE elevation up the  doorframe with a small reach over to promote rib cage elevation and spinal elongation 5x right/left   06/22/23    NuStep: Level 5x 6 minutes- PT present to discuss progress  Decompression leg lengthener leading with heel x5 reps each- tactile and verbal cues to reduce scapular elevation Supine yellow band overhead 10x  Supine yellow band diagonals 5x to each side Supine ER with yellow band x10 Horizontal abduction x10 with yellow band Thoracic extension in chair with pool noodle behind back x10 Seated row x10  Shoulder flexion: 1# x10  06/17/23    Evaluation Supine decompression series per patient instructions  Log rolling to get in/out  of bed                                                                                                                          PATIENT EDUCATION:  Education details: Educated patient on anatomy and physiology of current symptoms, prognosis, plan of care as well as initial self care strategies to promote recovery Person educated: Patient Education method: Explanation Education comprehension: verbalized understanding  HOME EXERCISE PROGRAM: Supine decompression series per patient instruction handout Supine scapular band yellow  Access Code: [email or web address] URL: [email or web address] Date: 06/25/2023 Prepared by: Darien Eden  Exercises - Seated Thoracic Lumbar Extension with Pectoralis Stretch  - 1 x daily - 7 x weekly - 1 sets - 10 reps - Standing Bilateral Low Shoulder Row with Anchored Resistance  - 1 x daily - 7 x weekly - 1 sets - 10 reps  ASSESSMENT:  CLINICAL IMPRESSION: Treatment focus on spinal lengthening and rib cage elevation in supine, seated and standing positions.  She reports "some pulling" in the back but not the sharp intense pain she had and no lingering pain after the exercises.  Therapist providing verbal cues to optimize technique with  exercises in order to achieve the greatest benefit.  HEP continues to be updated for a progression of exercises.      OBJECTIVE IMPAIRMENTS: decreased activity tolerance, decreased mobility, difficulty walking, decreased ROM, decreased strength, impaired perceived functional ability, postural dysfunction, and pain.   ACTIVITY LIMITATIONS: carrying, lifting, bending, standing, and bed mobility  PARTICIPATION LIMITATIONS: meal prep, cleaning, laundry, and community activity  PERSONAL FACTORS: 1-2 comorbidities: COPD, osteopenia, fibromyalgia  are also affecting patient's functional outcome.   REHAB POTENTIAL: Good  CLINICAL DECISION MAKING: Stable/uncomplicated  EVALUATION COMPLEXITY: Low   GOALS: Goals reviewed  with patient? Yes  SHORT TERM GOALS: Target date: 07/15/2023   The patient will demonstrate knowledge of basic self care strategies and exercises to promote healing  Baseline: Goal status: INITIAL  2.  The patient will demonstrate good hip hinge technique (and less spinal flexion) for functional movement/bending Baseline:  Goal status: INITIAL  3.  The patient will demonstrate good technique with getting in/out of bed (log rolling) Baseline:  Goal status: INITIAL      LONG TERM GOALS: Target date: 08/12/2023   The patient will be independent in a  safe self progression of a home exercise program to promote further recovery of function  Baseline:  Goal status: INITIAL  2.  Be able to lift/hold/carry great grandbaby Baseline:  Goal status: INITIAL  3.  Patient will be able to fold clothes/towels with greater ease pain level 4/10 Baseline:  Goal status: INITIAL  4.  Patient will be able to load and unload the dishwasher with greater ease with pain level 4/10 Baseline:  Goal status: INITIAL  5.  Modified Oswestry score improved to 34% indicating improved function with less pain Baseline:  Goal status: INITIAL    PLAN:  PT FREQUENCY: 2x/week  PT DURATION: 8 weeks  PLANNED INTERVENTIONS: 97164- PT Re-evaluation, 97110-Therapeutic exercises, 97530- Therapeutic activity, 97112- Neuromuscular re-education, 97535- Self Care, 86578- Manual therapy, [id number]- Aquatic Therapy, I6962- Electrical stimulation (unattended), [id number]- Electrical stimulation (manual), [id number]- Ultrasound, 95284- Ionotophoresis 4mg /ml Dexamethasone, Patient/Family education, Taping, Dry Needling, Spinal mobilization, Cryotherapy, and Moist heat.  PLAN FOR NEXT SESSION:   see if great grandbaby has arrived; supine scapular stabilization ex's with yellow band; scapular retraction,  thoracic extension, upper body strength   Darien Eden, PT 06/25/23 6:12 PM Phone: [phone] Fax:  [phone]  [facility] [address] [place], [place] [address] Phone # [phone] Fax [phone]

## 2023-06-25 NOTE — Patient Instructions (Addendum)
Over Head Pull: Narrow Grip       On back, knees bent, feet flat, band across thighs, elbows straight but relaxed. Pull hands apart (start). Keeping elbows straight, bring arms up and over head, hands toward floor. Keep pull steady on band. Hold momentarily. Return slowly, keeping pull steady, back to start. Repeat _5__ times. Band color __yellow____   Side Pull: Double Arm   On back, knees bent, feet flat. Arms perpendicular to body, shoulder level, elbows straight but relaxed. Pull arms out to sides, elbows straight. Resistance band comes across collarbones, hands toward floor. Hold momentarily. Slowly return to starting position. Repeat _5__ times. Band color _yellow___   Sash   On back, knees bent, feet flat, left hand on left hip, right hand above left. Pull right arm DIAGONALLY (hip to shoulder) across chest. Bring right arm along head toward floor. Hold momentarily. Slowly return to starting position. Repeat 5___ times. Do with left arm. Band color _yellow____   Shoulder Rotation: Double Arm   On back, knees bent, feet flat, elbows tucked at sides, bent 90, hands palms up. Pull hands apart and down toward floor, keeping elbows near sides. Hold momentarily. Slowly return to starting position. Repeat _5__ times. Band color _yellow_____   

## 2023-07-01 ENCOUNTER — Ambulatory Visit: Admitting: Physical Therapy

## 2023-07-01 DIAGNOSIS — M5459 Other low back pain: Secondary | ICD-10-CM | POA: Diagnosis not present

## 2023-07-01 DIAGNOSIS — M6281 Muscle weakness (generalized): Secondary | ICD-10-CM

## 2023-07-01 DIAGNOSIS — R293 Abnormal posture: Secondary | ICD-10-CM | POA: Diagnosis not present

## 2023-07-01 DIAGNOSIS — R0781 Pleurodynia: Secondary | ICD-10-CM | POA: Diagnosis not present

## 2023-07-01 NOTE — Therapy (Addendum)
 OUTPATIENT PHYSICAL THERAPY TREATMENT   Patient Name: Shelby Lin MRN: [id number] DOB:Oct 22, 1940, 83 y.o., female Today's Date: 07/01/2023  END OF SESSION:  PT End of Session - 07/01/23 1235     Visit Number 4    Number of Visits 6    Date for PT Re-Evaluation 08/12/23    Authorization Type [employer] Medicare-approved 6 visits 06/17/2023 - 07/01/2023    Authorization - Visit Number 4    Authorization - Number of Visits 6    Progress Note Due on Visit 10    PT Start Time 1231    PT Stop Time 1314    PT Time Calculation (min) 43 min    Activity Tolerance Patient tolerated treatment well              Past Medical History:  Diagnosis Date   Anxiety    Arthritis    Asthma in child    Atherosclerotic heart disease of native coronary artery without angina pectoris    Cataracts, bilateral    Combined hyperlipidemia    Constipation, unspecified    COPD (chronic obstructive pulmonary disease) (HCC)    Coronary artery spasm (HCC)    Diabetic neuropathy (HCC)    DM (diabetes mellitus) with complications (HCC)    Dupuytren's contracture    Dyspepsia    Familial tremor    Fatigue    Fibromyalgia syndrome    GERD (gastroesophageal reflux disease)    Hashimoto's thyroiditis    Hiatal hernia    History of colon polyps    Hx of Hashimoto thyroiditis    Hypercholesteremia    Hypertension    Hypothyroidism, acquired, autoimmune    IBS (irritable bowel syndrome)    Meniere's disease of left ear    Migraine without aura and without status migrainosus, not intractable    Multinodular goiter (nontoxic)    Nonexudative macular degeneration    Raynaud's syndrome without gangrene    Situational stress    Tachycardia    Thyroiditis, autoimmune    Ulcerative colitis, unspecified, without complications (HCC)    Vitamin B 12 deficiency    Vitamin D  deficiency    Wears glasses    Past Surgical History:  Procedure Laterality Date   ANAL RECTAL MANOMETRY Bilateral 11/26/2022    Procedure: ANO RECTAL MANOMETRY;  Surgeon: Evangeline Hilts, MD;  Location: [facility];  Service: Gastroenterology;  Laterality: Bilateral;   BREAST EXCISIONAL BIOPSY Left    benign   BREAST EXCISIONAL BIOPSY Right    milk gland removed   BREAST LUMPECTOMY Left 1990   benign   BREAST LUMPECTOMY WITH NEEDLE LOCALIZATION Right 02/28/2013   Procedure: RIGHT BREAST NEEDLE LOCALIZATION LUMPECTOMY ;  Surgeon: Quitman Bucy, MD;  Location: [facility];  Service: General;  Laterality: Right;   CHOLECYSTECTOMY  2008   lapcholi   DILATION AND CURETTAGE OF UTERUS     LEFT HEART CATHETERIZATION WITH CORONARY ANGIOGRAM N/A 01/26/2013   Procedure: LEFT HEART CATHETERIZATION WITH CORONARY ANGIOGRAM;  Surgeon: Dorothye Gathers, MD;  Location: [facility];  Service: Cardiovascular;  Laterality: N/A;   PARTIAL HYSTERECTOMY  1971   TONSILLECTOMY     Patient Active Problem List   Diagnosis Date Noted   Strain of left latissimus dorsi muscle 04/22/2023   Costal margin pain 04/17/2023   URI (upper respiratory infection) 02/26/2021   COVID-19 virus detected 03/25/2019   Physical deconditioning 02/23/2019   Gastroesophageal reflux disease 10/20/2018   Healthcare maintenance 10/20/2018   Bronchiectasis without complication (HCC)  04/22/2018   Nocturnal hypoxemia due to emphysema (HCC) 04/22/2018   Prediabetes 04/10/2018   Stage 3 severe COPD by GOLD classification (HCC) 03/01/2018   Overweight 10/13/2016   Ear fullness, left 09/08/2016   Goiter 11/02/2013   Hoarseness 11/02/2013   Action tremor 05/18/2013   Abnormal stress test 01/26/2013   Angina decubitus (HCC) 01/26/2013   Chronic tension headaches 04/06/2012   Chronic night sweats 04/06/2012   Meniere's disease 04/06/2012   Chronic leg pain 04/06/2012   Hearing loss, sensorineural 08/25/2011   Hypothyroidism, acquired, autoimmune    Fibromyalgia syndrome    Thyroiditis, autoimmune    Multinodular goiter (nontoxic)     Tachycardia    Familial tremor    Coronary artery spasm (HCC)    Dyspepsia    Fatigue    Combined hyperlipidemia    Dupuytren's contracture    Other disturbances of aromatic amino-acid metabolism 06/18/2010    PCP: Faustina Hood MD  REFERRING PROVIDER: Faustina Hood MD  REFERRING DIAG: G89.29 other chronic pain; M54.50 low back pain, R07.89 costochondral pain  Rationale for Evaluation and Treatment: Rehabilitation  THERAPY DIAG:  Other low back pain  Rib pain on left side  Muscle weakness (generalized)  ONSET DATE: 8 weeks ago (February)  SUBJECTIVE:                                                                                                                                                                                           SUBJECTIVE STATEMENT:  I've done pretty good until yesterday afternoon.  I've been going to the hospital and cooking for family (arrival of great grandbaby). Husband having surgery next week so will need to cancel appts.     From Eval: Started left lower back lateral 6-8 weeks ago.  Negative x-ray for lung involvement see CT results below.  Diagnosed with "costochondritis".  Took a 5 day round of prednisone , no change, took another stronger round and felt some better but when stopped pain returned.  Now pain has moved up higher in left mid back.  No recent fall or known reason for cause of this.    Saw chiro 5x and he would crack my back not much relief.  One day relief maybe.  Sleeping OK.  Uses oxygen  at night.  PERTINENT HISTORY:  Just finished pelvic floor PT with [facility] (constipation) Osteopenia lumbar spine per DEXA report IBS, colitis, partial hysterectomy 1971, cholecystectomy 2008, Severe COPD stage 3 with hypoxemia in exercise; fibromyalgia  PAIN: 07/01/23:   Are you having pain? Yes NPRS scale:0/10 Pain location: left mid back  Pain orientation: Left  PAIN TYPE: aching and throbbing  Pain description: intermittent  Aggravating  factors: repetitious activity folding clothes, towels, unload dishwasher; prolonged walking; sometimes cooking Relieving factors: Tylenol  , Salonpas, Aspercreme, lidocaine , heat, ice; lying down on my back    PRECAUTIONS: Stage 3 severe COPD with exercise hypoxemia; spinal osteopenia    WEIGHT BEARING RESTRICTIONS: No  FALLS:  Has patient fallen in last 6 months? No Reports almost 2 years ago she fell through the deck at her daughter's house and has had a fear of falling ever since  LIVING ENVIRONMENT: Lives with: lives with their spouse Lives in: House/apartment Stairs: No  OCCUPATION: retired  PLOF: Independent  PATIENT GOALS: I don't want this pain anymore; be able to hold new great grandbaby   OBJECTIVE:  Note: Objective measures were completed at Evaluation unless otherwise noted.  DIAGNOSTIC FINDINGS:  CT chest February   IMPRESSION: 1. No evidence of interstitial lung disease. 2. Small to moderate hiatal hernia. 3.  Emphysema (ICD10-J43.9). 4. Aortic atherosclerosis (ICD10-I70.0). Coronary artery calcification. 5. Enlarged pulmonic trunk, indicative of pulmonary arterial hypertension.    DEXA March 2024 T-score AP Spine  L1-L4 (L3) 05/19/2022    81.9         -1.5    0.995 g/cm2 AP Spine  L1-L4 (L3) 02/12/2016    75.6         -1.6    0.994 g/cm2   DualFemur Total Left 05/19/2022 81.9 -1.2 0.851 g/cm2 * DualFemur Total Left 02/12/2016    75.6         -1.0    0.887 g/cm2   DualFemur Total Mean 05/19/2022 81.9 -1.1 0.863 g/cm2 * DualFemur Total Mean 02/12/2016    75.6         -0.8    0.906 g/cm2   World Health Organization [facility]) criteria for post-menopausal, Caucasian Women: Normal       T-score at or above -1 SD Osteopenia   T-score between -1 and -2.5 SD Osteoporosis T-score at or below -2.5 SD  PATIENT SURVEYS:  Modified Oswestry 46%   COGNITION: Overall cognitive status: Within functional limits for tasks assessed      POSTURE: increased thoracic  kyphosis (moderate)  PALPATION: Tenderness thoracic region spinous processes, left thoracic paraspinals and left posterior ribs  LUMBAR ROM:   AROM eval  Flexion Guarded and slow but able to pick up small object from floor; increased kyphosis   Extension 5  Right lateral flexion 15 not painful  Left lateral flexion 15 not painful  Right rotation   Left rotation    (Blank rows = not tested)  TRUNK STRENGTH:  Decreased activation of transverse abdominus muscles; abdominals 4-/5; decreased activation of lumbar multifidi; trunk extensors 4-/5   LOWER EXTREMITY ROM:  single knee to chest produced anterior rib pain;  Les WFLS   FUNCTIONAL TESTS:  Able to rise 1x sit to stand without UE assist but slow and painful  GAIT: Comments: no assistive device  TREATMENT DATE:  07/01/23    Decompression leg lengthener leading with heel x5 reps each- tactile and verbal cues to reduce scapular elevation Supine yellow band overhead 10x (added to HEP) Supine yellow band diagonals 5x to each side (added to HEP) Supine ER with yellow band x10 (added to HEP) Horizontal abduction x10 with yellow band (added to HEP) Thoracic extension in chair with pool noodle behind back 10 (added to HEP) Standing row red band x10 (added to HEP) Standing in doorway UE elevation up the doorframe with a small reach over to promote  rib cage elevation and spinal elongation 5x right/left  06/25/23    Decompression leg lengthener leading with heel x5 reps each- tactile and verbal cues to reduce scapular elevation Supine yellow band overhead 10x (added to HEP) Supine yellow band diagonals 5x to each side (added to HEP) Supine ER with yellow band x10 (added to HEP) Horizontal abduction x10 with yellow band (added to HEP) Thoracic extension in chair with pool noodle behind back 10 (added to HEP) Standing row red band x10 (added to HEP) Standing in doorway UE elevation up the doorframe with a small reach over to promote rib  cage elevation and spinal elongation 5x right/left  06/22/23    NuStep: Level 5x 6 minutes- PT present to discuss progress  Decompression leg lengthener leading with heel x5 reps each- tactile and verbal cues to reduce scapular elevation Supine yellow band overhead 10x  Supine yellow band diagonals 5x to each side Supine ER with yellow band x10 Horizontal abduction x10 with yellow band Thoracic extension in chair with pool noodle behind back x10 Seated row x10  Shoulder flexion: 1# x10  06/17/23    Evaluation Supine decompression series per patient instructions  Log rolling to get in/out of bed                                                                                                                          PATIENT EDUCATION:  Education details: Educated patient on anatomy and physiology of current symptoms, prognosis, plan of care as well as initial self care strategies to promote recovery Person educated: Patient Education method: Explanation Education comprehension: verbalized understanding  HOME EXERCISE PROGRAM: Supine decompression series per patient instruction handout Supine scapular band yellow  Access Code: [email or web address] URL: [email or web address] Date: 06/25/2023 Prepared by: Darien Eden  Exercises - Seated Thoracic Lumbar Extension with Pectoralis Stretch  - 1 x daily - 7 x weekly - 1 sets - 10 reps - Standing Bilateral Low Shoulder Row with Anchored Resistance  - 1 x daily - 7 x weekly - 1 sets - 10 reps  ASSESSMENT:  CLINICAL IMPRESSION: Although she reports increased pain yesterday due to high activity level her pain level is very low on arrival and throughout session.  Min to moderate cues to perform HEP.  Treatment focus on spinal elongation and decompression strategies.  Patient unable to come next week due to her husband having a medical procedure.       OBJECTIVE IMPAIRMENTS: decreased activity tolerance, decreased mobility, difficulty  walking, decreased ROM, decreased strength, impaired perceived functional ability, postural dysfunction, and pain.   ACTIVITY LIMITATIONS: carrying, lifting, bending, standing, and bed mobility  PARTICIPATION LIMITATIONS: meal prep, cleaning, laundry, and community activity  PERSONAL FACTORS: 1-2 comorbidities: COPD, osteopenia, fibromyalgia are also affecting patient's functional outcome.   REHAB POTENTIAL: Good  CLINICAL DECISION MAKING: Stable/uncomplicated  EVALUATION COMPLEXITY: Low   GOALS: Goals reviewed with patient? Yes  SHORT TERM GOALS: Target date: 07/15/2023   The patient will  demonstrate knowledge of basic self care strategies and exercises to promote healing  Baseline: Goal status: INITIAL  2.  The patient will demonstrate good hip hinge technique (and less spinal flexion) for functional movement/bending Baseline:  Goal status: INITIAL  3.  The patient will demonstrate good technique with getting in/out of bed (log rolling) Baseline:  Goal status: INITIAL      LONG TERM GOALS: Target date: 08/12/2023   The patient will be independent in a safe self progression of a home exercise program to promote further recovery of function  Baseline:  Goal status: INITIAL  2.  Be able to lift/hold/carry great grandbaby Baseline:  Goal status: INITIAL  3.  Patient will be able to fold clothes/towels with greater ease pain level 4/10 Baseline:  Goal status: INITIAL  4.  Patient will be able to load and unload the dishwasher with greater ease with pain level 4/10 Baseline:  Goal status: INITIAL  5.  Modified Oswestry score improved to 34% indicating improved function with less pain Baseline:  Goal status: INITIAL    PLAN:  PT FREQUENCY: 2x/week  PT DURATION: 8 weeks  PLANNED INTERVENTIONS: 97164- PT Re-evaluation, 97110-Therapeutic exercises, 97530- Therapeutic activity, 97112- Neuromuscular re-education, 97535- Self Care, 16109- Manual therapy, [id number]-  Aquatic Therapy, U0454- Electrical stimulation (unattended), [id number]- Electrical stimulation (manual), [id number]- Ultrasound, 09811- Ionotophoresis 4mg /ml Dexamethasone, Patient/Family education, Taping, Dry Needling, Spinal mobilization, Cryotherapy, and Moist heat.  PLAN FOR NEXT SESSION:  check progress towards goals; pt unable to come next week b/c husband having surgery; supine scapular stabilization ex's with yellow band; scapular retraction,  thoracic extension, upper body strength   Darien Eden, PT 07/01/23 1:16 PM Phone: [phone] Fax: [phone]  [facility] [address], [address] [place], [place] [address] Phone # [phone] Fax [phone]

## 2023-07-07 ENCOUNTER — Encounter

## 2023-07-09 ENCOUNTER — Encounter: Admitting: Physical Therapy

## 2023-07-14 ENCOUNTER — Ambulatory Visit

## 2023-07-14 DIAGNOSIS — R0781 Pleurodynia: Secondary | ICD-10-CM | POA: Diagnosis not present

## 2023-07-14 DIAGNOSIS — M6281 Muscle weakness (generalized): Secondary | ICD-10-CM

## 2023-07-14 DIAGNOSIS — R293 Abnormal posture: Secondary | ICD-10-CM | POA: Diagnosis not present

## 2023-07-14 DIAGNOSIS — M5459 Other low back pain: Secondary | ICD-10-CM

## 2023-07-14 NOTE — Therapy (Signed)
 OUTPATIENT PHYSICAL THERAPY TREATMENT   Patient Name: Shelby Lin MRN: [id number] DOB:1940-04-20, 83 y.o., female Today's Date: 07/14/2023  END OF SESSION:  PT End of Session - 07/14/23 1015     Visit Number 5    Date for PT Re-Evaluation 08/12/23    Authorization Type [employer] Medicare-approved 6 visits 06/17/2023 - 07/01/2023    Authorization - Visit Number 5    Authorization - Number of Visits 6    Progress Note Due on Visit 10    PT Start Time 0932    PT Stop Time 1012    PT Time Calculation (min) 40 min    Activity Tolerance Patient tolerated treatment well    Behavior During Therapy WFL for tasks assessed/performed               Past Medical History:  Diagnosis Date   Anxiety    Arthritis    Asthma in child    Atherosclerotic heart disease of native coronary artery without angina pectoris    Cataracts, bilateral    Combined hyperlipidemia    Constipation, unspecified    COPD (chronic obstructive pulmonary disease) (HCC)    Coronary artery spasm (HCC)    Diabetic neuropathy (HCC)    DM (diabetes mellitus) with complications (HCC)    Dupuytren's contracture    Dyspepsia    Familial tremor    Fatigue    Fibromyalgia syndrome    GERD (gastroesophageal reflux disease)    Hashimoto's thyroiditis    Hiatal hernia    History of colon polyps    Hx of Hashimoto thyroiditis    Hypercholesteremia    Hypertension    Hypothyroidism, acquired, autoimmune    IBS (irritable bowel syndrome)    Meniere's disease of left ear    Migraine without aura and without status migrainosus, not intractable    Multinodular goiter (nontoxic)    Nonexudative macular degeneration    Raynaud's syndrome without gangrene    Situational stress    Tachycardia    Thyroiditis, autoimmune    Ulcerative colitis, unspecified, without complications (HCC)    Vitamin B 12 deficiency    Vitamin D  deficiency    Wears glasses    Past Surgical History:  Procedure Laterality Date   ANAL  RECTAL MANOMETRY Bilateral 11/26/2022   Procedure: ANO RECTAL MANOMETRY;  Surgeon: Evangeline Hilts, MD;  Location: [facility];  Service: Gastroenterology;  Laterality: Bilateral;   BREAST EXCISIONAL BIOPSY Left    benign   BREAST EXCISIONAL BIOPSY Right    milk gland removed   BREAST LUMPECTOMY Left 1990   benign   BREAST LUMPECTOMY WITH NEEDLE LOCALIZATION Right 02/28/2013   Procedure: RIGHT BREAST NEEDLE LOCALIZATION LUMPECTOMY ;  Surgeon: Quitman Bucy, MD;  Location: [facility];  Service: General;  Laterality: Right;   CHOLECYSTECTOMY  2008   lapcholi   DILATION AND CURETTAGE OF UTERUS     LEFT HEART CATHETERIZATION WITH CORONARY ANGIOGRAM N/A 01/26/2013   Procedure: LEFT HEART CATHETERIZATION WITH CORONARY ANGIOGRAM;  Surgeon: Dorothye Gathers, MD;  Location: [facility];  Service: Cardiovascular;  Laterality: N/A;   PARTIAL HYSTERECTOMY  1971   TONSILLECTOMY     Patient Active Problem List   Diagnosis Date Noted   Strain of left latissimus dorsi muscle 04/22/2023   Costal margin pain 04/17/2023   URI (upper respiratory infection) 02/26/2021   COVID-19 virus detected 03/25/2019   Physical deconditioning 02/23/2019   Gastroesophageal reflux disease 10/20/2018   Healthcare maintenance 10/20/2018  Bronchiectasis without complication (HCC) 04/22/2018   Nocturnal hypoxemia due to emphysema (HCC) 04/22/2018   Prediabetes 04/10/2018   Stage 3 severe COPD by GOLD classification (HCC) 03/01/2018   Overweight 10/13/2016   Ear fullness, left 09/08/2016   Goiter 11/02/2013   Hoarseness 11/02/2013   Action tremor 05/18/2013   Abnormal stress test 01/26/2013   Angina decubitus (HCC) 01/26/2013   Chronic tension headaches 04/06/2012   Chronic night sweats 04/06/2012   Meniere's disease 04/06/2012   Chronic leg pain 04/06/2012   Hearing loss, sensorineural 08/25/2011   Hypothyroidism, acquired, autoimmune    Fibromyalgia syndrome    Thyroiditis, autoimmune     Multinodular goiter (nontoxic)    Tachycardia    Familial tremor    Coronary artery spasm (HCC)    Dyspepsia    Fatigue    Combined hyperlipidemia    Dupuytren's contracture    Other disturbances of aromatic amino-acid metabolism 06/18/2010    PCP: Faustina Hood MD  REFERRING PROVIDER: Faustina Hood MD  REFERRING DIAG: G89.29 other chronic pain; M54.50 low back pain, R07.89 costochondral pain  Rationale for Evaluation and Treatment: Rehabilitation  THERAPY DIAG:  Other low back pain  Rib pain on left side  Muscle weakness (generalized)  Abnormal posture  ONSET DATE: 8 weeks ago (February)  SUBJECTIVE:                                                                                                                                                                                           SUBJECTIVE STATEMENT: 100% reduction in my pain since I started PT.  My husband didn't have surgery and will have surgery this Thursday.     From Eval: Started left lower back lateral 6-8 weeks ago.  Negative x-ray for lung involvement see CT results below.  Diagnosed with "costochondritis".  Took a 5 day round of prednisone , no change, took another stronger round and felt some better but when stopped pain returned.  Now pain has moved up higher in left mid back.  No recent fall or known reason for cause of this.    Saw chiro 5x and he would crack my back not much relief.  One day relief maybe.  Sleeping OK.  Uses oxygen  at night.  PERTINENT HISTORY:  Just finished pelvic floor PT with [facility] (constipation) Osteopenia lumbar spine per DEXA report IBS, colitis, partial hysterectomy 1971, cholecystectomy 2008, Severe COPD stage 3 with hypoxemia in exercise; fibromyalgia  PAIN: 07/14/23:   Are you having pain? Yes NPRS scale:0/10 Pain location: left mid back  Pain orientation: Left  PAIN TYPE: aching and throbbing Pain description: intermittent  Aggravating factors:  repetitious activity  folding clothes, towels, unload dishwasher; prolonged walking; sometimes cooking Relieving factors: Tylenol  , Salonpas, Aspercreme, lidocaine , heat, ice; lying down on my back    PRECAUTIONS: Stage 3 severe COPD with exercise hypoxemia; spinal osteopenia    WEIGHT BEARING RESTRICTIONS: No  FALLS:  Has patient fallen in last 6 months? No Reports almost 2 years ago she fell through the deck at her daughter's house and has had a fear of falling ever since  LIVING ENVIRONMENT: Lives with: lives with their spouse Lives in: House/apartment Stairs: No  OCCUPATION: retired  PLOF: Independent  PATIENT GOALS: I don't want this pain anymore; be able to hold new great grandbaby   OBJECTIVE:  Note: Objective measures were completed at Evaluation unless otherwise noted.  DIAGNOSTIC FINDINGS:  CT chest February   IMPRESSION: 1. No evidence of interstitial lung disease. 2. Small to moderate hiatal hernia. 3.  Emphysema (ICD10-J43.9). 4. Aortic atherosclerosis (ICD10-I70.0). Coronary artery calcification. 5. Enlarged pulmonic trunk, indicative of pulmonary arterial hypertension.    DEXA March 2024 T-score AP Spine  L1-L4 (L3) 05/19/2022    81.9         -1.5    0.995 g/cm2 AP Spine  L1-L4 (L3) 02/12/2016    75.6         -1.6    0.994 g/cm2   DualFemur Total Left 05/19/2022 81.9 -1.2 0.851 g/cm2 * DualFemur Total Left 02/12/2016    75.6         -1.0    0.887 g/cm2   DualFemur Total Mean 05/19/2022 81.9 -1.1 0.863 g/cm2 * DualFemur Total Mean 02/12/2016    75.6         -0.8    0.906 g/cm2   World Health Organization [facility]) criteria for post-menopausal, Caucasian Women: Normal       T-score at or above -1 SD Osteopenia   T-score between -1 and -2.5 SD Osteoporosis T-score at or below -2.5 SD  PATIENT SURVEYS:  Modified Oswestry 46%   COGNITION: Overall cognitive status: Within functional limits for tasks assessed      POSTURE: increased thoracic kyphosis  (moderate)  PALPATION: Tenderness thoracic region spinous processes, left thoracic paraspinals and left posterior ribs  LUMBAR ROM:   AROM eval  Flexion Guarded and slow but able to pick up small object from floor; increased kyphosis   Extension 5  Right lateral flexion 15 not painful  Left lateral flexion 15 not painful  Right rotation   Left rotation    (Blank rows = not tested)  TRUNK STRENGTH:  Decreased activation of transverse abdominus muscles; abdominals 4-/5; decreased activation of lumbar multifidi; trunk extensors 4-/5   LOWER EXTREMITY ROM:  single knee to chest produced anterior rib pain;  Les WFLS   FUNCTIONAL TESTS:  Able to rise 1x sit to stand without UE assist but slow and painful  GAIT: Comments: no assistive device  TREATMENT DATE:  07/14/23    NuStep: level 5x 8 minuts-PT present to discuss progress Decompression leg lengthener leading with heel x10 reps each- tactile and verbal cues to lead with heel Supine yellow band overhead 10x  Supine yellow band diagonals 5x to each side  Supine ER with yellow band x10  Horizontal abduction x10 with yellow band  Thoracic extension in chair with pool noodle behind back 10 (added to HEP)  07/01/23    Decompression leg lengthener leading with heel x5 reps each- tactile and verbal cues to reduce scapular elevation Supine yellow band overhead 10x (added  to HEP) Supine yellow band diagonals 5x to each side (added to HEP) Supine ER with yellow band x10 (added to HEP) Horizontal abduction x10 with yellow band (added to HEP) Thoracic extension in chair with pool noodle behind back 10 (added to HEP) Standing row red band x10 (added to HEP) Standing in doorway UE elevation up the doorframe with a small reach over to promote rib cage elevation and spinal elongation 5x right/left  06/25/23    Decompression leg lengthener leading with heel x5 reps each- tactile and verbal cues to reduce scapular elevation Supine yellow band  overhead 10x (added to HEP) Supine yellow band diagonals 5x to each side (added to HEP) Supine ER with yellow band x10 (added to HEP) Horizontal abduction x10 with yellow band (added to HEP) Thoracic extension in chair with pool noodle behind back 10 (added to HEP) Standing row red band x10 (added to HEP) Standing in doorway UE elevation up the doorframe with a small reach over to promote rib cage elevation and spinal elongation 5x right/left                                                                               PATIENT EDUCATION:  Education details: Educated patient on anatomy and physiology of current symptoms, prognosis, plan of care as well as initial self care strategies to promote recovery Person educated: Patient Education method: Explanation Education comprehension: verbalized understanding  HOME EXERCISE PROGRAM: Supine decompression series per patient instruction handout Supine scapular band yellow  Access Code: [email or web address] URL: [email or web address] Date: 06/25/2023 Prepared by: Darien Eden  Exercises - Seated Thoracic Lumbar Extension with Pectoralis Stretch  - 1 x daily - 7 x weekly - 1 sets - 10 reps - Standing Bilateral Low Shoulder Row with Anchored Resistance  - 1 x daily - 7 x weekly - 1 sets - 10 reps  ASSESSMENT:  CLINICAL IMPRESSION: Pt reports that her pain in back and ribs has resolved.  No pain with daily tasks or holding her grandchild. She had lapse in treatment as her husband was supposed to have surgery and this was postponed to 07/16/23.  Session spent with review of HEP for supine strength and alignment exercises.  Verbal and tactile cues provided by PT for technique. She is going to return next week and likely discharge for her back.  She is on the waiting list for pelvic floor for early June and will resume this treatment as able.  Patient will benefit from skilled PT to address the below impairments and improve overall function.    OBJECTIVE IMPAIRMENTS: decreased activity tolerance, decreased mobility, difficulty walking, decreased ROM, decreased strength, impaired perceived functional ability, postural dysfunction, and pain.   ACTIVITY LIMITATIONS: carrying, lifting, bending, standing, and bed mobility  PARTICIPATION LIMITATIONS: meal prep, cleaning, laundry, and community activity  PERSONAL FACTORS: 1-2 comorbidities: COPD, osteopenia, fibromyalgia are also affecting patient's functional outcome.   REHAB POTENTIAL: Good  CLINICAL DECISION MAKING: Stable/uncomplicated  EVALUATION COMPLEXITY: Low   GOALS: Goals reviewed with patient? Yes  SHORT TERM GOALS: Target date: 07/15/2023   The patient will demonstrate knowledge of basic self care strategies and exercises to promote healing  Baseline: Goal status: met  2.  The patient will demonstrate good hip hinge technique (and less spinal flexion) for functional movement/bending Baseline:  Goal status: met  3.  The patient will demonstrate good technique with getting in/out of bed (log rolling) Baseline:  Goal status: met      LONG TERM GOALS: Target date: 08/12/2023   The patient will be independent in a safe self progression of a home exercise program to promote further recovery of function  Baseline:  Goal status: INITIAL  2.  Be able to lift/hold/carry great grandbaby Baseline:  Goal status: INITIAL  3.  Patient will be able to fold clothes/towels with greater ease pain level 4/10 Baseline:  Goal status: MET  4.  Patient will be able to load and unload the dishwasher with greater ease with pain level 4/10 Baseline:  Goal status: MET  5.  Modified Oswestry score improved to 34% indicating improved function with less pain Baseline:  Goal status: INITIAL    PLAN:  PT FREQUENCY: 2x/week  PT DURATION: 8 weeks  PLANNED INTERVENTIONS: 97164- PT Re-evaluation, 97110-Therapeutic exercises, 97530- Therapeutic activity, 97112-  Neuromuscular re-education, 97535- Self Care, 16109- Manual therapy, [id number]- Aquatic Therapy, U0454- Electrical stimulation (unattended), [id number]- Electrical stimulation (manual), [id number]- Ultrasound, 09811- Ionotophoresis 4mg /ml Dexamethasone, Patient/Family education, Taping, Dry Needling, Spinal mobilization, Cryotherapy, and Moist heat.  PLAN FOR NEXT SESSION:  1 more session probable (1 more approved for insurance) and pt will return to pelvic PT in June. Modified Oswestry, final goals  Luella Sager, PT 07/14/23 10:16 AM   [facility] [address] [place], [place] [address] Phone # [phone] Fax [phone]

## 2023-07-16 ENCOUNTER — Ambulatory Visit: Admitting: Physical Therapy

## 2023-07-21 ENCOUNTER — Ambulatory Visit: Admitting: Physical Therapy

## 2023-07-23 ENCOUNTER — Ambulatory Visit: Admitting: Physical Therapy

## 2023-07-23 DIAGNOSIS — R293 Abnormal posture: Secondary | ICD-10-CM | POA: Diagnosis not present

## 2023-07-23 DIAGNOSIS — R0781 Pleurodynia: Secondary | ICD-10-CM | POA: Diagnosis not present

## 2023-07-23 DIAGNOSIS — M6281 Muscle weakness (generalized): Secondary | ICD-10-CM

## 2023-07-23 DIAGNOSIS — M5459 Other low back pain: Secondary | ICD-10-CM

## 2023-07-23 NOTE — Therapy (Signed)
 OUTPATIENT PHYSICAL THERAPY TREATMENT/DISCHARGE SUMMARY   Patient Name: Shelby Lin MRN: [id number] DOB:1940-09-04, 83 y.o., female Today's Date: 07/23/2023  END OF SESSION:  PT End of Session - 07/23/23 1227     Visit Number 6    Number of Visits 8    Date for PT Re-Evaluation 08/12/23    Authorization Type [employer] 8 visits 5/8-07/30/23    Authorization - Visit Number 6    Authorization - Number of Visits 8    Progress Note Due on Visit 10    PT Start Time 1230    PT Stop Time 1314    PT Time Calculation (min) 44 min    Activity Tolerance Patient tolerated treatment well               Past Medical History:  Diagnosis Date   Anxiety    Arthritis    Asthma in child    Atherosclerotic heart disease of native coronary artery without angina pectoris    Cataracts, bilateral    Combined hyperlipidemia    Constipation, unspecified    COPD (chronic obstructive pulmonary disease) (HCC)    Coronary artery spasm (HCC)    Diabetic neuropathy (HCC)    DM (diabetes mellitus) with complications (HCC)    Dupuytren's contracture    Dyspepsia    Familial tremor    Fatigue    Fibromyalgia syndrome    GERD (gastroesophageal reflux disease)    Hashimoto's thyroiditis    Hiatal hernia    History of colon polyps    Hx of Hashimoto thyroiditis    Hypercholesteremia    Hypertension    Hypothyroidism, acquired, autoimmune    IBS (irritable bowel syndrome)    Meniere's disease of left ear    Migraine without aura and without status migrainosus, not intractable    Multinodular goiter (nontoxic)    Nonexudative macular degeneration    Raynaud's syndrome without gangrene    Situational stress    Tachycardia    Thyroiditis, autoimmune    Ulcerative colitis, unspecified, without complications (HCC)    Vitamin B 12 deficiency    Vitamin D  deficiency    Wears glasses    Past Surgical History:  Procedure Laterality Date   ANAL RECTAL MANOMETRY Bilateral 11/26/2022   Procedure:  ANO RECTAL MANOMETRY;  Surgeon: Evangeline Hilts, MD;  Location: [facility];  Service: Gastroenterology;  Laterality: Bilateral;   BREAST EXCISIONAL BIOPSY Left    benign   BREAST EXCISIONAL BIOPSY Right    milk gland removed   BREAST LUMPECTOMY Left 1990   benign   BREAST LUMPECTOMY WITH NEEDLE LOCALIZATION Right 02/28/2013   Procedure: RIGHT BREAST NEEDLE LOCALIZATION LUMPECTOMY ;  Surgeon: Quitman Bucy, MD;  Location: [facility];  Service: General;  Laterality: Right;   CHOLECYSTECTOMY  2008   lapcholi   DILATION AND CURETTAGE OF UTERUS     LEFT HEART CATHETERIZATION WITH CORONARY ANGIOGRAM N/A 01/26/2013   Procedure: LEFT HEART CATHETERIZATION WITH CORONARY ANGIOGRAM;  Surgeon: Dorothye Gathers, MD;  Location: [facility];  Service: Cardiovascular;  Laterality: N/A;   PARTIAL HYSTERECTOMY  1971   TONSILLECTOMY     Patient Active Problem List   Diagnosis Date Noted   Strain of left latissimus dorsi muscle 04/22/2023   Costal margin pain 04/17/2023   URI (upper respiratory infection) 02/26/2021   COVID-19 virus detected 03/25/2019   Physical deconditioning 02/23/2019   Gastroesophageal reflux disease 10/20/2018   Healthcare maintenance 10/20/2018   Bronchiectasis without complication (HCC) 04/22/2018  Nocturnal hypoxemia due to emphysema (HCC) 04/22/2018   Prediabetes 04/10/2018   Stage 3 severe COPD by GOLD classification (HCC) 03/01/2018   Overweight 10/13/2016   Ear fullness, left 09/08/2016   Goiter 11/02/2013   Hoarseness 11/02/2013   Action tremor 05/18/2013   Abnormal stress test 01/26/2013   Angina decubitus (HCC) 01/26/2013   Chronic tension headaches 04/06/2012   Chronic night sweats 04/06/2012   Meniere's disease 04/06/2012   Chronic leg pain 04/06/2012   Hearing loss, sensorineural 08/25/2011   Hypothyroidism, acquired, autoimmune    Fibromyalgia syndrome    Thyroiditis, autoimmune    Multinodular goiter (nontoxic)    Tachycardia     Familial tremor    Coronary artery spasm (HCC)    Dyspepsia    Fatigue    Combined hyperlipidemia    Dupuytren's contracture    Other disturbances of aromatic amino-acid metabolism 06/18/2010    PCP: Faustina Hood MD  REFERRING PROVIDER: Faustina Hood MD  REFERRING DIAG: G89.29 other chronic pain; M54.50 low back pain, R07.89 costochondral pain  Rationale for Evaluation and Treatment: Rehabilitation  THERAPY DIAG:  Other low back pain  Rib pain on left side  Muscle weakness (generalized)  ONSET DATE: 8 weeks ago (February)  SUBJECTIVE:                                                                                                                                                                                           SUBJECTIVE STATEMENT: I'm sick with a headache today. Not much rib pain.  I'm having terrible problems with constipation.  I take stool softeners, natural laxative, using Squatty potty, doing abdominal massage but haven't been to the bathroom in 11 days.  Drinking lots of water.   Husband had his procedure and it went really good.    From Eval: Started left lower back lateral 6-8 weeks ago.  Negative x-ray for lung involvement see CT results below.  Diagnosed with "costochondritis".  Took a 5 day round of prednisone , no change, took another stronger round and felt some better but when stopped pain returned.  Now pain has moved up higher in left mid back.  No recent fall or known reason for cause of this.    Saw chiro 5x and he would crack my back not much relief.  One day relief maybe.  Sleeping OK.  Uses oxygen  at night.  PERTINENT HISTORY:  Just finished pelvic floor PT with Miki Alert (constipation) Osteopenia lumbar spine per DEXA report IBS, colitis, partial hysterectomy 1971, cholecystectomy 2008, Severe COPD stage 3 with hypoxemia in exercise; fibromyalgia  PAIN: 07/14/23:   Are you having pain? Yes  NPRS scale:0/10 Pain location: left mid back  Pain  orientation: Left  PAIN TYPE: aching and throbbing Pain description: intermittent  Aggravating factors: repetitious activity folding clothes, towels, unload dishwasher; prolonged walking; sometimes cooking Relieving factors: Tylenol  , Salonpas, Aspercreme, lidocaine , heat, ice; lying down on my back    PRECAUTIONS: Stage 3 severe COPD with exercise hypoxemia; spinal osteopenia    WEIGHT BEARING RESTRICTIONS: No  FALLS:  Has patient fallen in last 6 months? No Reports almost 2 years ago she fell through the deck at her daughter's house and has had a fear of falling ever since  LIVING ENVIRONMENT: Lives with: lives with their spouse Lives in: House/apartment Stairs: No  OCCUPATION: retired  PLOF: Independent  PATIENT GOALS: I don't want this pain anymore; be able to hold new great grandbaby   OBJECTIVE:  Note: Objective measures were completed at Evaluation unless otherwise noted.  DIAGNOSTIC FINDINGS:  CT chest February   IMPRESSION: 1. No evidence of interstitial lung disease. 2. Small to moderate hiatal hernia. 3.  Emphysema (ICD10-J43.9). 4. Aortic atherosclerosis (ICD10-I70.0). Coronary artery calcification. 5. Enlarged pulmonic trunk, indicative of pulmonary arterial hypertension.    DEXA March 2024 T-score AP Spine  L1-L4 (L3) 05/19/2022    81.9         -1.5    0.995 g/cm2 AP Spine  L1-L4 (L3) 02/12/2016    75.6         -1.6    0.994 g/cm2   DualFemur Total Left 05/19/2022 81.9 -1.2 0.851 g/cm2 * DualFemur Total Left 02/12/2016    75.6         -1.0    0.887 g/cm2   DualFemur Total Mean 05/19/2022 81.9 -1.1 0.863 g/cm2 * DualFemur Total Mean 02/12/2016    75.6         -0.8    0.906 g/cm2   World Health Organization [facility]) criteria for post-menopausal, Caucasian Women: Normal       T-score at or above -1 SD Osteopenia   T-score between -1 and -2.5 SD Osteoporosis T-score at or below -2.5 SD  PATIENT SURVEYS:  Modified Oswestry 46%   5/29:   10%  COGNITION: Overall cognitive status: Within functional limits for tasks assessed      POSTURE: increased thoracic kyphosis (moderate)  PALPATION: Tenderness thoracic region spinous processes, left thoracic paraspinals and left posterior ribs  LUMBAR ROM:   AROM eval 5/29  Flexion Guarded and slow but able to pick up small object from floor; increased kyphosis  Able to pick up small object from the floor with squat method  Extension 5 10  Right lateral flexion 15 not painful 20  Left lateral flexion 15 not painful 20  Right rotation    Left rotation     (Blank rows = not tested)  TRUNK STRENGTH:  Decreased activation of transverse abdominus muscles; abdominals 4-/5; decreased activation of lumbar multifidi; trunk extensors 4-/5   LOWER EXTREMITY ROM:  single knee to chest produced anterior rib pain;  Les WFLS   FUNCTIONAL TESTS:  Able to rise 1x sit to stand without UE assist but slow and painful  5/29:  sit to stand with ease no hands  GAIT: Comments: no assistive device  TREATMENT DATE:  07/23/23: Lumbar ROM Oswestry STS Strategies for constipation: water intake, walking, abdominal massage, Squatty potty; patient to get new referral for pelvic floor PT from Dr. Kimble Pennant now that back pain has improved  Review of HEP: areas of emphasis Standing red band rows anchored  on the door   07/14/23    NuStep: level 5x 8 minuts-PT present to discuss progress Decompression leg lengthener leading with heel x10 reps each- tactile and verbal cues to lead with heel Supine yellow band overhead 10x  Supine yellow band diagonals 5x to each side  Supine ER with yellow band x10  Horizontal abduction x10 with yellow band  Thoracic extension in chair with pool noodle behind back 10 (added to HEP)  07/01/23    Decompression leg lengthener leading with heel x5 reps each- tactile and verbal cues to reduce scapular elevation Supine yellow band overhead 10x (added to HEP) Supine yellow  band diagonals 5x to each side (added to HEP) Supine ER with yellow band x10 (added to HEP) Horizontal abduction x10 with yellow band (added to HEP) Thoracic extension in chair with pool noodle behind back 10 (added to HEP) Standing row red band x10 (added to HEP) Standing in doorway UE elevation up the doorframe with a small reach over to promote rib cage elevation and spinal elongation 5x right/left  06/25/23    Decompression leg lengthener leading with heel x5 reps each- tactile and verbal cues to reduce scapular elevation Supine yellow band overhead 10x (added to HEP) Supine yellow band diagonals 5x to each side (added to HEP) Supine ER with yellow band x10 (added to HEP) Horizontal abduction x10 with yellow band (added to HEP) Thoracic extension in chair with pool noodle behind back 10 (added to HEP) Standing row red band x10 (added to HEP) Standing in doorway UE elevation up the doorframe with a small reach over to promote rib cage elevation and spinal elongation 5x right/left                                                                               PATIENT EDUCATION:  Education details: Educated patient on anatomy and physiology of current symptoms, prognosis, plan of care as well as initial self care strategies to promote recovery Person educated: Patient Education method: Explanation Education comprehension: verbalized understanding  HOME EXERCISE PROGRAM: Supine decompression series per patient instruction handout Supine scapular band yellow  Access Code: [facility] URL: [email or web address] Date: 06/25/2023 Prepared by: Darien Eden  Exercises - Seated Thoracic Lumbar Extension with Pectoralis Stretch  - 1 x daily - 7 x weekly - 1 sets - 10 reps - Standing Bilateral Low Shoulder Row with Anchored Resistance  - 1 x daily - 7 x weekly - 1 sets - 10 reps  ASSESSMENT:  CLINICAL IMPRESSION: The patient has met the majority of rehab goals, with noted  improvements in pain reduction, outcome score, ROM, strength and functional mobility.  A comprehensive HEP has been established and anticipate further improvements over time with regular performance of the program.  Recommend discharge from PT at this time.    OBJECTIVE IMPAIRMENTS: decreased activity tolerance, decreased mobility, difficulty walking, decreased ROM, decreased strength, impaired perceived functional ability, postural dysfunction, and pain.   ACTIVITY LIMITATIONS: carrying, lifting, bending, standing, and bed mobility  PARTICIPATION LIMITATIONS: meal prep, cleaning, laundry, and community activity  PERSONAL FACTORS: 1-2 comorbidities: COPD, osteopenia, fibromyalgia are also affecting patient's functional outcome.   REHAB POTENTIAL: Good  CLINICAL DECISION MAKING: Stable/uncomplicated  EVALUATION COMPLEXITY: Low   GOALS: Goals reviewed with patient? Yes  SHORT TERM GOALS: Target date: 07/15/2023   The patient will demonstrate knowledge of basic self care strategies and exercises to promote healing  Baseline: Goal status: met  2.  The patient will demonstrate good hip hinge technique (and less spinal flexion) for functional movement/bending Baseline:  Goal status: met  3.  The patient will demonstrate good technique with getting in/out of bed (log rolling) Baseline:  Goal status: met      LONG TERM GOALS: Target date: 08/12/2023   The patient will be independent in a safe self progression of a home exercise program to promote further recovery of function  Baseline:  Goal status: met 5/29  2.  Be able to lift/hold/carry great grandbaby Baseline:  Goal status: met 5/29  3.  Patient will be able to fold clothes/towels with greater ease pain level 4/10 Baseline:  Goal status: MET  4.  Patient will be able to load and unload the dishwasher with greater ease with pain level 4/10 Baseline:  Goal status: MET  5.  Modified Oswestry score improved to 34%  indicating improved function with less pain Baseline:  Goal status: met 5/29    PLAN: PHYSICAL THERAPY DISCHARGE SUMMARY  Visits from Start of Care: 6  Current functional level related to goals / functional outcomes: See clinical impressions above. Patient will get new referral for pelvic floor PT to address constipation.   Remaining deficits: As above   Education / Equipment: HEP   Patient agrees to discharge. Patient goals were met. Patient is being discharged due to meeting the stated rehab goals.  Darien Eden, PT 07/23/23 1:57 PM Phone: [phone] Fax: [phone]  [facility] [address] [place], [place] [address] Phone # [phone] Fax [phone]

## 2023-07-28 ENCOUNTER — Ambulatory Visit: Admitting: Physical Therapy

## 2023-07-28 ENCOUNTER — Ambulatory Visit: Attending: Gastroenterology

## 2023-07-28 ENCOUNTER — Other Ambulatory Visit: Payer: Self-pay

## 2023-07-28 DIAGNOSIS — M6281 Muscle weakness (generalized): Secondary | ICD-10-CM | POA: Insufficient documentation

## 2023-07-28 DIAGNOSIS — R293 Abnormal posture: Secondary | ICD-10-CM | POA: Diagnosis not present

## 2023-07-28 DIAGNOSIS — M62838 Other muscle spasm: Secondary | ICD-10-CM | POA: Insufficient documentation

## 2023-07-28 DIAGNOSIS — R279 Unspecified lack of coordination: Secondary | ICD-10-CM | POA: Insufficient documentation

## 2023-07-28 NOTE — Therapy (Signed)
 OUTPATIENT PHYSICAL THERAPY FEMALE PELVIC EVALUATION   Patient Name: Shelby Lin MRN: [id number] DOB:1940/12/13, 83 y.o., female Today's Date: 07/28/2023  END OF SESSION:  PT End of Session - 07/28/23 0927     Visit Number 1    Date for PT Re-Evaluation 10/20/23    Authorization Type [employer]    Authorization Time Period auth required    Progress Note Due on Visit 10    PT Start Time 0930    PT Stop Time 1010    PT Time Calculation (min) 40 min    Activity Tolerance Patient tolerated treatment well    Behavior During Therapy WFL for tasks assessed/performed             Past Medical History:  Diagnosis Date   Anxiety    Arthritis    Asthma in child    Atherosclerotic heart disease of native coronary artery without angina pectoris    Cataracts, bilateral    Combined hyperlipidemia    Constipation, unspecified    COPD (chronic obstructive pulmonary disease) (HCC)    Coronary artery spasm (HCC)    Diabetic neuropathy (HCC)    DM (diabetes mellitus) with complications (HCC)    Dupuytren's contracture    Dyspepsia    Familial tremor    Fatigue    Fibromyalgia syndrome    GERD (gastroesophageal reflux disease)    Hashimoto's thyroiditis    Hiatal hernia    History of colon polyps    Hx of Hashimoto thyroiditis    Hypercholesteremia    Hypertension    Hypothyroidism, acquired, autoimmune    IBS (irritable bowel syndrome)    Meniere's disease of left ear    Migraine without aura and without status migrainosus, not intractable    Multinodular goiter (nontoxic)    Nonexudative macular degeneration    Raynaud's syndrome without gangrene    Situational stress    Tachycardia    Thyroiditis, autoimmune    Ulcerative colitis, unspecified, without complications (HCC)    Vitamin B 12 deficiency    Vitamin D  deficiency    Wears glasses    Past Surgical History:  Procedure Laterality Date   ANAL RECTAL MANOMETRY Bilateral 11/26/2022   Procedure: ANO RECTAL MANOMETRY;   Surgeon: Evangeline Hilts, MD;  Location: [facility];  Service: Gastroenterology;  Laterality: Bilateral;   BREAST EXCISIONAL BIOPSY Left    benign   BREAST EXCISIONAL BIOPSY Right    milk gland removed   BREAST LUMPECTOMY Left 1990   benign   BREAST LUMPECTOMY WITH NEEDLE LOCALIZATION Right 02/28/2013   Procedure: RIGHT BREAST NEEDLE LOCALIZATION LUMPECTOMY ;  Surgeon: Quitman Bucy, MD;  Location: [facility];  Service: General;  Laterality: Right;   CHOLECYSTECTOMY  2008   lapcholi   DILATION AND CURETTAGE OF UTERUS     LEFT HEART CATHETERIZATION WITH CORONARY ANGIOGRAM N/A 01/26/2013   Procedure: LEFT HEART CATHETERIZATION WITH CORONARY ANGIOGRAM;  Surgeon: Dorothye Gathers, MD;  Location: [facility] C[facility];  Service: Cardiovascular;  Laterality: N/A;   PARTIAL HYSTERECTOMY  1971   TONSILLECTOMY     Patient Active Problem List   Diagnosis Date Noted   Strain of left latissimus dorsi muscle 04/22/2023   Costal margin pain 04/17/2023   URI (upper respiratory infection) 02/26/2021   COVID-19 virus detected 03/25/2019   Physical deconditioning 02/23/2019   Gastroesophageal reflux disease 10/20/2018   Healthcare maintenance 10/20/2018   Bronchiectasis without complication (HCC) 04/22/2018   Nocturnal hypoxemia due to emphysema (HCC) 04/22/2018  Prediabetes 04/10/2018   Stage 3 severe COPD by GOLD classification (HCC) 03/01/2018   Overweight 10/13/2016   Ear fullness, left 09/08/2016   Goiter 11/02/2013   Hoarseness 11/02/2013   Action tremor 05/18/2013   Abnormal stress test 01/26/2013   Angina decubitus (HCC) 01/26/2013   Chronic tension headaches 04/06/2012   Chronic night sweats 04/06/2012   Meniere's disease 04/06/2012   Chronic leg pain 04/06/2012   Hearing loss, sensorineural 08/25/2011   Hypothyroidism, acquired, autoimmune    Fibromyalgia syndrome    Thyroiditis, autoimmune    Multinodular goiter (nontoxic)    Tachycardia    Familial tremor     Coronary artery spasm (HCC)    Dyspepsia    Fatigue    Combined hyperlipidemia    Dupuytren's contracture    Other disturbances of aromatic amino-acid metabolism 06/18/2010    PCP: Faustina Hood, MD  REFERRING PROVIDER: Evangeline Hilts, MD   REFERRING DIAG: N81.84 (ICD-10-CM) - Pelvic muscle wasting  THERAPY DIAG:  Muscle weakness (generalized)  Other muscle spasm  Unspecified lack of coordination  Abnormal posture  Rationale for Evaluation and Treatment: Rehabilitation  ONSET DATE: 12/26/2022  SUBJECTIVE:                                                                                                                                                                                           SUBJECTIVE STATEMENT: Pt still having severe difficulty with constipation, only having 1-2 bowel movements a week.    PAIN:  Are you having pain? Yes NPRS scale: 2/10 - much better since PT Pain location: mid back  Pain type: aching Pain description: intermittent   Aggravating factors: standing and increased activity  Relieving factors: rest  PRECAUTIONS: None  RED FLAGS: None   WEIGHT BEARING RESTRICTIONS: No  FALLS:  Has patient fallen in last 6 months? Has patient fallen in last 6 months? No Reports almost 2 years ago she fell through the deck at her daughter's house and has had a fear of falling ever since  OCCUPATION: retired  ACTIVITY LEVEL : PT exercises for back  PLOF: Independent  PATIENT GOALS: to have regular bowel movements   PERTINENT HISTORY:  IBS, colitis, partial hysterectomy 1971, cholecystectomy 2008, Severe COPD stage 3 with hypoxemia in exercise; spinal osteopenia    BOWEL MOVEMENT: Pain with bowel movement: No Type of bowel movement:Type (Bristol Stool Scale) often 1, sometimes will have number 4 and when that happens she will not feel it, sometimes she has number 7 and has no control, Frequency 1-2x/week, and Strain sometimes Fully empty  rectum: Yes: but only sometimes Leakage: Yes: when she has gas,  which she reports is often Pads: Yes:   Fiber supplement/laxative she has not taken anything this week, last week she took a stool softener several days (Colace)   URINATION: Pain with urination: No Fully empty bladder: Yes:   Stream: Strong Urgency: No Frequency: 2-3x/night, tries to urinate as often as she can during the day Fluid Intake: a lot of water, tries to only drink water after 6pm, one cup of coffee in the morning Leakage: Coughing, Sneezing, and Laughing Pads: Yes: small pad or panty liner day and night just in case  INTERCOURSE: Not currently sexually active  PREGNANCY: Vaginal deliveries 2 Tearing Yes: first delivery - to rectum C-section deliveries 0 Currently pregnant No  PROLAPSE: None   OBJECTIVE:  Note: Objective measures were completed at Evaluation unless otherwise noted.   PATIENT SURVEYS:   PFIQ-7: 71  COGNITION: Overall cognitive status: Within functional limits for tasks assessed     SENSATION: Light touch: Appears intact   FUNCTIONAL TESTS:  Squat: Rt weight shift Single leg stance:  Rt: pelvic drop  Lt: pelvic drop Curl-up test: distortion throughout abdomen    GAIT: Assistive device utilized: None Comments: Unstable  POSTURE: rounded shoulders, forward head, decreased lumbar lordosis, increased thoracic kyphosis, and anterior pelvic tilt   LUMBARAROM/PROM:  A/PROM A/PROM  Eval (% available)  Flexion 50  Extension 2050  Right lateral flexion 50  Left lateral flexion 25  Right rotation 25  Left rotation    (Blank rows = not tested)  PALPATION:   General: soreness over Rt SIJ, bil gluteal gripping  Pelvic Alignment: anterior pelvic tilt  Abdominal: increased costosternal rib angle, no lateral rib cage expansion                External Perineal Exam: dryness                             Internal Pelvic Floor: dryness, discomfort, spasm in bil  puborectalis; she has largely improved coordination with bearing down and pushing, but did not increase intra-rectal pressure appropriately; stool present in rectum  Patient confirms identification and approves PT to assess internal pelvic floor and treatment Yes  PELVIC MMT:   MMT eval  Vaginal 2/5, 3 second hold, 8 repeat contractions  Internal Anal Sphincter 2/5  External Anal Sphincter 3/5  Puborectalis 3/5  Diastasis Recti 2 finger width diastasis  (Blank rows = not tested)        TONE: High in bil puborectalis  PROLAPSE: Grade 1 anterior/posterior vaginal wall laxity observed in supine in the morning   TODAY'S TREATMENT:                                                                                                                              DATE:  07/01/23  EVAL   Neuromuscular re-education: Pt provides verbal consent for internal vaginal/rectal pelvic floor exam. Internal rectal and vaginal pelvic  floor muscle contraction training Quick flicks Long holds Exercises: Single knee to chest Lower trunk rotation Seated forward fold    PATIENT EDUCATION:  Education details: See above Person educated: Patient Education method: Explanation, Demonstration, Tactile cues, Verbal cues, and Handouts Education comprehension: verbalized understanding  HOME EXERCISE PROGRAM: [id number]  ASSESSMENT:  CLINICAL IMPRESSION: Patient is a 83 y.o. female who was seen today for physical therapy evaluation and treatment for chronic constipation. Exam findings notable for decreased lumbar A/ROM, abnormal posture, pelvic instability and decreased balance in single leg stance, abnormal squat, diastasis recti with distortion, decreased rib cage mobility and increased costosternal angle, pelvic floor muscle weakness and decreased endurance, spasm in bil puborectalis, posterior/anterior vaginal wall laxity, and poor generation of intra-rectal pressure with bearing down. Signs and symptoms are  most consistent with posterior vaginal wall laxity, spasm in puborectalis, pelvic floor muscle weakness, poor intrarectal pressure generation, and likely having megarectum. Compared to previous PT, she has increased coordination with bearing down and does not have dyssynergia at this time; she has also improved pelvic floor muscle strength compared to her last visit. Initial treatment today consisted of stretches to help improve puborectalis spasm; she was also instructed in pelvic floor muscle contractions in order to give her better proprioception of relaxation vs contraction and continue working on strength building. We went over posture and she was instructed to stop gripping gluteal muscles in order to help correct posture and allow pelvic floor muscles to relax more. I discussed with patient that I will send a message to referring provide about possibly getting a KUB and clean out/laxative program if indicated. She will continue to benefit from skilled PT intervention in order to improve ease/complete emptying of bowel movements, increase bowel movement frequency, address all impairments, and progress functional strengthening program.   OBJECTIVE IMPAIRMENTS: decreased activity tolerance, decreased coordination, decreased endurance, decreased mobility, decreased ROM, decreased strength, increased fascial restrictions, increased muscle spasms, impaired flexibility, impaired tone, improper body mechanics, postural dysfunction, and pain.   ACTIVITY LIMITATIONS: continence and bowel movements   PARTICIPATION LIMITATIONS: cleaning, interpersonal relationship, and community activity  PERSONAL FACTORS: 3+ comorbidities: medical history are also affecting patient's functional outcome.   REHAB POTENTIAL: Good  CLINICAL DECISION MAKING: Evolving/moderate complexity  EVALUATION COMPLEXITY: Moderate   GOALS: Goals reviewed with patient? Yes  SHORT TERM GOALS: Target date: 08/25/2023   Pt will be  independent with HEP.   Baseline: Goal status: INITIAL  2.  Pt will report increase in bowel movement frequency to 3x/week in order to decrease fecal incontinence for improved odor and skin hygiene.  Baseline:  Goal status: INITIAL  3.  Pt will improve consistency of bowel movements to 4-5 on Bristol Stool Scale in order to more completely empty and decrease fecal incontinence.  Baseline:  Goal status: INITIAL  4.  Pt will report 25% improvement in fecal incontinence with passing gas.  Baseline:  Goal status: INITIAL  5.  Pt will be independent with splinting. Baseline:  Goal status: INITIAL  6.  Pt will be independent with enema use if needed and report 0 episodes of digging out stool in order to help improve rectal sensation/ease of bowel movements Baseline:  Goal status: INITIAL  LONG TERM GOALS: Target date: 10/20/23  Pt will be independent with advanced HEP.   Baseline:  Goal status: INITIAL  2.  Pt will report 75% improvement in fecal continence with passing gas.  Baseline:  Goal status: INITIAL  3.  Pt will report increase in  bowel movement frequency to 6x/week in order to decrease fecal incontinence for improved odor and skin hygiene.  Baseline:  Goal status: INITIAL  4.  Pt will demonstrate appropriate intrarectal pressure generation in order to completely empty with bowel movements.  Baseline:  Goal status: INITIAL  5.  Pt will demonstrate no painful or increased puborectalis tone in order to allow complete evacuation of bowel movements.  Baseline:  Goal status: INITIAL  6.  Pt will report no leaks with laughing, coughing, sneezing in order to improve comfort with interpersonal relationships and community activities.   Baseline:  Goal status: INITIAL  PLAN:  PT FREQUENCY: 1-2x/week  PT DURATION: 6 months  PLANNED INTERVENTIONS: 97110-Therapeutic exercises, 97530- Therapeutic activity, 97112- Neuromuscular re-education, 97535- Self Care, 16109-  Manual therapy, Dry Needling, and Biofeedback  PLAN FOR NEXT SESSION: Rehabilitative ultrasound imaging (RUSI), manual techniques to abdomen/back/hips, review any messages from MD about clean out/laxative program, mobility activities and core strengthening.   Verlena Glenn, PT, DPT06/04/2510:09 PM     Verlena Glenn, PT, DPT06/04/2510:09 PM

## 2023-07-30 ENCOUNTER — Ambulatory Visit

## 2023-08-03 ENCOUNTER — Other Ambulatory Visit: Payer: Self-pay | Admitting: Internal Medicine

## 2023-08-04 ENCOUNTER — Encounter: Admitting: Physical Therapy

## 2023-08-06 ENCOUNTER — Encounter

## 2023-08-14 DIAGNOSIS — K5900 Constipation, unspecified: Secondary | ICD-10-CM | POA: Diagnosis not present

## 2023-08-18 ENCOUNTER — Other Ambulatory Visit: Payer: Self-pay | Admitting: Internal Medicine

## 2023-08-19 ENCOUNTER — Ambulatory Visit: Payer: Self-pay | Admitting: Family Medicine

## 2023-08-19 NOTE — Telephone Encounter (Signed)
 Recommend COVID testing. Sounds viral in nature. No indication for abx at this point. Can send benzonatate  1 capsule Three times a day PRN cough. Supportive care - mucinex and delsym OTC. OTC analgesics (tylenol , ibuprofen, etc) PRN pain. UC if symptoms worsen. Thanks.

## 2023-08-19 NOTE — Telephone Encounter (Signed)
 FYI Only or Action Required?: Action required by provider: request for appointment.  Patient is followed in Pulmonology for COPD, last seen on 04/22/2023 by Malachy Comer GAILS, NP. Called Nurse Triage reporting Shortness of Breath. Symptoms began yesterday. Interventions attempted: Nothing. Symptoms are: stable.  Triage Disposition: See Physician Within 24 Hours  Patient/caregiver understands and will follow disposition?: Yes                    Copied from CRM [id number]. Topic: Clinical - Red Word Triage >> Aug 19, 2023 11:11 AM Rilla B wrote: [facility] that prompted transfer to Nurse Triage:  Difficulty Breathing, COPD Reason for Disposition  SEVERE coughing spells (e.g., whooping sound after coughing, vomiting after coughing)  Answer Assessment - Initial Assessment Questions Patient has an appt with Dr. Geronimo on Tues, 7/1. Patient wants to know if he can see her sooner or if he can call her in an antibiotic. Patient refuses seeing anyone else. This RN educated pt on new-worsening symptoms and when to call back/seek emergent care. Pt verbalized understanding and agrees to plan.    Patient states she developed a dry cough yesterday afternoon Runny nose- clear Headache since yesterday afternoon (7/10 pain level) Denies fever, hemoptysis Denies difficulty breathing except when coughing  Protocols used: Cough - Acute Non-Productive-A-AH

## 2023-08-19 NOTE — Telephone Encounter (Signed)
 Spoke with patient regarding prior message .  Per Shelby Lin   Recommend COVID testing. Sounds viral in nature. No indication for abx at this point. Can send benzonatate  1 capsule Three times a day PRN cough. Supportive care - mucinex and delsym OTC. OTC analgesics (tylenol , ibuprofen, etc) PRN pain. UC if symptoms worsen. Thanks.    Patient stated she does have a office visit on 08/25/2023 with MR.  Patient's voice was understanding.Nothing else further needed.

## 2023-08-20 ENCOUNTER — Ambulatory Visit: Payer: Self-pay

## 2023-08-20 NOTE — Telephone Encounter (Signed)
 FYI Only or Action Required?: FYI only for provider.  Patient is followed in Pulmonology for COPD, last seen on 04/22/2023 by Malachy Comer GAILS, NP. Called Nurse Triage reporting Cough.  Triage Disposition: Duplicate Contact Calls  Patient/caregiver understands and will follow disposition?: Yes     Copied from CRM [id number]. Topic: Clinical - Red Word Triage >> Aug 20, 2023  4:05 PM Joesph PARAS wrote: Red Word that prompted transfer to Nurse Triage: Worsening SOB, running nose, headache/migraine, medicine is not working. Has not been in the heat but is sweating profusely. States is coughing and feels weak. Reason for Disposition  Caller has already spoken with another triager or PCP AND has further questions AND triager able to answer questions.  Answer Assessment - Initial Assessment Questions E2C2 Pulmonary Triage - Initial Assessment Questions Chief Complaint (e.g., cough, sob, wheezing, fever, chills, sweat or additional symptoms) *Go to specific symptom protocol after initial questions. Coughing so much that my chest and back is sore Recently triaged yesterday GLENWOOD Pagan, NP gave advise Pt reports that sx are the same since yesterday - did some of NP's recommendations.  Answer Assessment - Initial Assessment Questions 1. REASON FOR CALL or QUESTION: What is your reason for calling today? or How can I best help you? or What question do you have that I can help answer? Pt calling to see if primary Pulmonologist has appt available tomorrow-- PAS transferred d/t being symptomatic. Pt reports she is coughing so much that my chest and back is sore Recently triaged yesterday - Katie, NP gave rec's that were relayed to pt Pt reports that sx are the same since yesterday - did some of NP's recommendations, but did endorse that she has not taken a COVID test, nor try Delsym. Triager reviewed NP's rec's and advised pt to call back for positive COVID/flu results or for worsening sx. Patient  verbalized understanding and to call back with worsening symptoms.  Protocols used: Breathing Difficulty-A-AH, Information Only Call - No Triage-A-AH, No Contact or Duplicate Contact Call-A-AH

## 2023-08-21 NOTE — Telephone Encounter (Signed)
 Duplicate calls pt has appt on 08/25/2023

## 2023-08-24 ENCOUNTER — Telehealth: Payer: Self-pay | Admitting: Internal Medicine

## 2023-08-24 NOTE — Telephone Encounter (Signed)
 Spoke with patient, she started with a cough, sore throat x1 day, nasal stuffiness, no fever, has a headache.  Still has a headache and stuffiness.  Some coughing, dry cough.  Symptoms started 08/18/2023.  Tested positive on 6/26.  She is taking delsym and tessalon .  She is feeling better than she was.  Advised to come tomorrow, arrive at 11:15 am for check in for her 11:30 am appointment and wear a mask.  I let her know we will check with Dr. Geronimo to see if he wants a CXR prior to her visit.  She verbalized understanding.  Dr. Geronimo, This patient has an appointment with you tomorrow, do you want her to have a cxr prior to her visit?  Please advise.  Thank you.

## 2023-08-24 NOTE — Telephone Encounter (Signed)
 She can try paxlovid if she can afford Iit.    PAXLOVID   Paxlovid (nirmatelvir 300/Ritonavir100) - BID x 5 days - for GFR >= 60 - presumed normal rena function    PLEASE CHECK MED LIST for the following issues. Please check the 2 different condition related to concomitant medications in alphabetical order  If Shelby Lin with DOB 09/22/1940 is on any of the following Strong CYP3A inhibtors - this patient Shelby Lin should withold these concomitant meds so they can start paxlovid stratight away. If taking any of these:  alfuzosin, amiodarone, clozapine, colchicine, dihydroergotamine, dronedarone, ergotamine, flecainide, lovastatin, lurasidone, methylergonovine, midazolam  [oral], pethidine, pimozide, propafenone, propoxyphene, quinidine, ranolazine, sildenafil simvastatin, triazolam).   If   Shelby Lin  with dob 05-Feb-1941 Is on any of these other strong CYP3A inducers then starting paxlovid should be delayed and the following meds should wash out first. These are dapalutamide, carbamazepine , phenobarbital, phenytoin, rifampin, St John's wort) - let me know immediately and we should delay starting paxlovid by some days even if he stops these medication.    PLEASE INFORM Shelby Lin  OF FOLLOWING SIDE EFFECTS  Side effects - all < 5%  - skin rash (and veyr rare a conditon called TEN) - angiomedia  - myalgia - jaundice - high bP (1%) - loss of taste  - diarrhea   Current Outpatient Medications:    albuterol  (VENTOLIN  HFA) 108 (90 Base) MCG/ACT inhaler, Inhale 1-2 puffs into the lungs every 6 (six) hours as needed for wheezing or shortness of breath., Disp: 18 g, Rfl: 6   aspirin  EC 81 MG tablet, Take 1 tablet (81 mg total) by mouth daily., Disp: , Rfl:    azithromycin  (ZITHROMAX ) 250 MG tablet, Take 2 tablets on first day, then 1 tablet daily until finished., Disp: 6 tablet, Rfl: 0   Baclofen  5 MG TABS, Take 1 tablet (5 mg total) by mouth 3 (three) times daily as  needed., Disp: 30 tablet, Rfl: 0   benzonatate  (TESSALON ) 200 MG capsule, Take 1 capsule (200 mg total) by mouth 3 (three) times daily as needed for cough., Disp: 30 capsule, Rfl: 1   Cyanocobalamin  (VITAMIN B 12 PO), Take by mouth daily., Disp: , Rfl:    DULoxetine (CYMBALTA) 30 MG capsule, Take 30 mg by mouth 2 (two) times daily., Disp: , Rfl:    fluticasone  (FLONASE ) 50 MCG/ACT nasal spray, Place 1 spray into both nostrils daily., Disp: 16 g, Rfl: 2   furosemide (LASIX) 20 MG tablet, Take 20 mg by mouth as needed., Disp: , Rfl:    hydrochlorothiazide (HYDRODIURIL) 25 MG tablet, TAKE 1 TABLET BY MOUTH EVERY DAY IN THE MORNING, Disp: , Rfl: 1   lidocaine  (LIDODERM ) 5 %, Place 1 patch onto the skin daily. Remove & Discard patch within 12 hours or as directed by MD, Disp: 30 patch, Rfl: 0   LORazepam (ATIVAN) 0.5 MG tablet, TAKE 1/2 TAB EVERY 8 12 HOURS AS NEEDED FOR ANXIOUSNESS, Disp: , Rfl:    losartan (COZAAR) 100 MG tablet, Take 100 mg by mouth daily., Disp: , Rfl: 1   meclizine (ANTIVERT) 25 MG tablet, 1/2 to 1 tablet by mouth every 8 hrs as needed for diziness, Disp: , Rfl:    Multiple Vitamin (MULTIVITAMIN) tablet, Take 1 tablet by mouth daily., Disp: , Rfl:    omeprazole (PRILOSEC) 20 MG capsule, Take 20 mg by mouth daily., Disp: , Rfl:    predniSONE  (DELTASONE ) 10 MG tablet, 4  tabs for 3 days, then 3 tabs for 3 days, 2 tabs for 3 days, then 1 tab for 3 days, then stop, Disp: 30 tablet, Rfl: 0   Respiratory Therapy Supplies (FLUTTER) DEVI, Use device 2-3 times a day to break up congestion, Disp: 1 each, Rfl: 0   rosuvastatin  (CRESTOR ) 20 MG tablet, Take 1 tablet (20 mg total) by mouth daily., Disp: 90 tablet, Rfl: 3   SYNTHROID  88 MCG tablet, TAKE DAILY BEFORE BREAKFAST. 1 TABLET 6 DAYS EACH WEEK, BUT TAKE ONLY 1/2 TABLET ON THE 7TH DAY, Disp: 30 tablet, Rfl: 5   TRELEGY ELLIPTA  100-62.5-25 MCG/ACT AEPB, INHALE 1 PUFF BY MOUTH EVERY DAY, Disp: 60 each, Rfl: 11   VITAMIN D , CHOLECALCIFEROL,  PO, Take 50,000 Units by mouth once a week. , Disp: , Rfl:

## 2023-08-24 NOTE — Telephone Encounter (Signed)
 MR- I spoke with the pt and she states no need for Paxlovid bc she is already taking lagevrio Her question was about whether or not she should do a cxr before her appt with you tomorrow  If you want this, please sign the order that I pended I advised her to arrive 15 min prior to appt time

## 2023-08-24 NOTE — Telephone Encounter (Signed)
 Answering Service:   Was in the ofc around 4pm 6/26. Was told to take Covid test afterwards. She did and its positive. Please call back.

## 2023-08-24 NOTE — Telephone Encounter (Signed)
 Spoke with patient regarding prior message. Patient stated she had medication sent into her pharmacy and patient stated she is feeling some better today . Patient was advised to wear her mask for office visit with [facility]. Patient stated she doesn't need the Paxlovid sent in .  Patient's voice was understanding.Nothing else further needed.

## 2023-08-25 ENCOUNTER — Ambulatory Visit: Payer: Medicare Other | Admitting: Internal Medicine

## 2023-08-25 ENCOUNTER — Encounter: Payer: Self-pay | Admitting: *Deleted

## 2023-08-26 ENCOUNTER — Telehealth: Payer: Self-pay | Admitting: *Deleted

## 2023-08-26 NOTE — Telephone Encounter (Signed)
 Copied from CRM [id number]. Topic: Appointments - Scheduling Inquiry for Clinic >> Aug 25, 2023 12:31 PM Shelby Lin wrote: Reason for CRM: Patient states she needs to be seen way before 9/2 because she had Covid.   I called and spoke with the pt and rescheduled her appt to see MR on 09/25/23 Nothing further needed

## 2023-08-26 NOTE — Telephone Encounter (Signed)
 Yesterday we reschduled patient

## 2023-09-03 ENCOUNTER — Ambulatory Visit: Payer: Self-pay | Attending: Gastroenterology

## 2023-09-03 DIAGNOSIS — M6281 Muscle weakness (generalized): Secondary | ICD-10-CM | POA: Insufficient documentation

## 2023-09-03 DIAGNOSIS — R279 Unspecified lack of coordination: Secondary | ICD-10-CM | POA: Diagnosis not present

## 2023-09-03 DIAGNOSIS — R293 Abnormal posture: Secondary | ICD-10-CM | POA: Diagnosis not present

## 2023-09-03 DIAGNOSIS — M62838 Other muscle spasm: Secondary | ICD-10-CM | POA: Insufficient documentation

## 2023-09-03 DIAGNOSIS — M5459 Other low back pain: Secondary | ICD-10-CM | POA: Insufficient documentation

## 2023-09-03 NOTE — Therapy (Addendum)
 OUTPATIENT PHYSICAL THERAPY FEMALE PELVIC TREATMENT   Patient Name: Shelby Lin MRN: [id number] DOB:11/15/1940, 83 y.o., female Today's Date: 09/03/2023  END OF SESSION:  PT End of Session - 09/03/23 0921     Visit Number 2    Date for PT Re-Evaluation 10/20/23    Authorization Type [employer]    Authorization Time Period 07/28/2023 - 10/20/2023    Authorization - Visit Number 1    Authorization - Number of Visits 10    Progress Note Due on Visit 10    PT Start Time 0930    PT Stop Time 1012    PT Time Calculation (min) 42 min    Activity Tolerance Patient tolerated treatment well    Behavior During Therapy WFL for tasks assessed/performed           Past Medical History:  Diagnosis Date   Anxiety    Arthritis    Asthma in child    Atherosclerotic heart disease of native coronary artery without angina pectoris    Cataracts, bilateral    Combined hyperlipidemia    Constipation, unspecified    COPD (chronic obstructive pulmonary disease) (HCC)    Coronary artery spasm (HCC)    Diabetic neuropathy (HCC)    DM (diabetes mellitus) with complications (HCC)    Dupuytren's contracture    Dyspepsia    Familial tremor    Fatigue    Fibromyalgia syndrome    GERD (gastroesophageal reflux disease)    Hashimoto's thyroiditis    Hiatal hernia    History of colon polyps    Hx of Hashimoto thyroiditis    Hypercholesteremia    Hypertension    Hypothyroidism, acquired, autoimmune    IBS (irritable bowel syndrome)    Meniere's disease of left ear    Migraine without aura and without status migrainosus, not intractable    Multinodular goiter (nontoxic)    Nonexudative macular degeneration    Raynaud's syndrome without gangrene    Situational stress    Tachycardia    Thyroiditis, autoimmune    Ulcerative colitis, unspecified, without complications (HCC)    Vitamin B 12 deficiency    Vitamin D  deficiency    Wears glasses    Past Surgical History:  Procedure Laterality Date    ANAL RECTAL MANOMETRY Bilateral 11/26/2022   Procedure: ANO RECTAL MANOMETRY;  Surgeon: Burnette Fallow, MD;  Location: [facility];  Service: Gastroenterology;  Laterality: Bilateral;   BREAST EXCISIONAL BIOPSY Left    benign   BREAST EXCISIONAL BIOPSY Right    milk gland removed   BREAST LUMPECTOMY Left 1990   benign   BREAST LUMPECTOMY WITH NEEDLE LOCALIZATION Right 02/28/2013   Procedure: RIGHT BREAST NEEDLE LOCALIZATION LUMPECTOMY ;  Surgeon: Morene ONEIDA Olives, MD;  Location: [facility];  Service: General;  Laterality: Right;   CHOLECYSTECTOMY  2008   lapcholi   DILATION AND CURETTAGE OF UTERUS     LEFT HEART CATHETERIZATION WITH CORONARY ANGIOGRAM N/A 01/26/2013   Procedure: LEFT HEART CATHETERIZATION WITH CORONARY ANGIOGRAM;  Surgeon: Oneil Parchment, MD;  Location: [facility];  Service: Cardiovascular;  Laterality: N/A;   PARTIAL HYSTERECTOMY  1971   TONSILLECTOMY     Patient Active Problem List   Diagnosis Date Noted   Strain of left latissimus dorsi muscle 04/22/2023   Costal margin pain 04/17/2023   URI (upper respiratory infection) 02/26/2021   COVID-19 virus detected 03/25/2019   Physical deconditioning 02/23/2019   Gastroesophageal reflux disease 10/20/2018   Healthcare maintenance 10/20/2018  Bronchiectasis without complication (HCC) 04/22/2018   Nocturnal hypoxemia due to emphysema (HCC) 04/22/2018   Prediabetes 04/10/2018   Stage 3 severe COPD by GOLD classification (HCC) 03/01/2018   Overweight 10/13/2016   Ear fullness, left 09/08/2016   Goiter 11/02/2013   Hoarseness 11/02/2013   Action tremor 05/18/2013   Abnormal stress test 01/26/2013   Angina decubitus (HCC) 01/26/2013   Chronic tension headaches 04/06/2012   Chronic night sweats 04/06/2012   Meniere's disease 04/06/2012   Chronic leg pain 04/06/2012   Hearing loss, sensorineural 08/25/2011   Hypothyroidism, acquired, autoimmune    Fibromyalgia syndrome    Thyroiditis, autoimmune     Multinodular goiter (nontoxic)    Tachycardia    Familial tremor    Coronary artery spasm (HCC)    Dyspepsia    Fatigue    Combined hyperlipidemia    Dupuytren's contracture    Other disturbances of aromatic amino-acid metabolism 06/18/2010    PCP: Claudene Pellet, MD  REFERRING PROVIDER: Burnette Fallow, MD   REFERRING DIAG: N81.84 (ICD-10-CM) - Pelvic muscle wasting  THERAPY DIAG:  Muscle weakness (generalized)  Other muscle spasm  Unspecified lack of coordination  Abnormal posture  Other low back pain  Rationale for Evaluation and Treatment: Rehabilitation  ONSET DATE: 12/26/2022  SUBJECTIVE:                                                                                                                                                                                           SUBJECTIVE STATEMENT: Pt states that MD recommended clean out and she did this about 3 weeks ago. She was instructed to start taking Miralax every single night. She did have some fecal incontinence without sensation during the clean out. She talked to the MD about this he was instructed to stop taking Miralax and begin taking fiber. The day after she started this she had a normal bowel movement - one week ago. The next day she had a loose stool. Then she stopped taking fiber and stopped having good bowel movements.    PAIN:  Are you having pain? Yes NPRS scale: 2/10 - much better since PT Pain location: mid back  Pain type: aching Pain description: intermittent   Aggravating factors: standing and increased activity  Relieving factors: rest  PRECAUTIONS: None  RED FLAGS: None   WEIGHT BEARING RESTRICTIONS: No  FALLS:  Has patient fallen in last 6 months? Has patient fallen in last 6 months? No Reports almost 2 years ago she fell through the deck at her daughter's house and has had a fear of falling ever since  OCCUPATION: retired  ACTIVITY LEVEL : PT  exercises for back  PLOF:  Independent  PATIENT GOALS: to have regular bowel movements   PERTINENT HISTORY:  IBS, colitis, partial hysterectomy 1971, cholecystectomy 2008, Severe COPD stage 3 with hypoxemia in exercise; spinal osteopenia    BOWEL MOVEMENT: Pain with bowel movement: No Type of bowel movement:Type (Bristol Stool Scale) often 1, sometimes will have number 4 and when that happens she will not feel it, sometimes she has number 7 and has no control, Frequency 1-2x/week, and Strain sometimes Fully empty rectum: Yes: but only sometimes Leakage: Yes: when she has gas, which she reports is often Pads: Yes:   Fiber supplement/laxative she has not taken anything this week, last week she took a stool softener several days (Colace)   URINATION: Pain with urination: No Fully empty bladder: Yes:   Stream: Strong Urgency: No Frequency: 2-3x/night, tries to urinate as often as she can during the day Fluid Intake: a lot of water, tries to only drink water after 6pm, one cup of coffee in the morning Leakage: Coughing, Sneezing, and Laughing Pads: Yes: small pad or panty liner day and night just in case  INTERCOURSE: Not currently sexually active  PREGNANCY: Vaginal deliveries 2 Tearing Yes: first delivery - to rectum C-section deliveries 0 Currently pregnant No  PROLAPSE: None   OBJECTIVE:  Note: Objective measures were completed at Evaluation unless otherwise noted.   PATIENT SURVEYS:   PFIQ-7: 72  COGNITION: Overall cognitive status: Within functional limits for tasks assessed     SENSATION: Light touch: Appears intact   FUNCTIONAL TESTS:  Squat: Rt weight shift Single leg stance:  Rt: pelvic drop  Lt: pelvic drop Curl-up test: distortion throughout abdomen    GAIT: Assistive device utilized: None Comments: Unstable  POSTURE: rounded shoulders, forward head, decreased lumbar lordosis, increased thoracic kyphosis, and anterior pelvic tilt   LUMBARAROM/PROM:  A/PROM A/PROM   Eval (% available)  Flexion 50  Extension 2050  Right lateral flexion 50  Left lateral flexion 25  Right rotation 25  Left rotation    (Blank rows = not tested)  PALPATION:   General: soreness over Rt SIJ, bil gluteal gripping  Pelvic Alignment: anterior pelvic tilt  Abdominal: increased costosternal rib angle, no lateral rib cage expansion                External Perineal Exam: dryness                             Internal Pelvic Floor: dryness, discomfort, spasm in bil puborectalis; she has largely improved coordination with bearing down and pushing, but did not increase intra-rectal pressure appropriately; stool present in rectum  Patient confirms identification and approves PT to assess internal pelvic floor and treatment Yes  PELVIC MMT:   MMT eval  Vaginal 2/5, 3 second hold, 8 repeat contractions  Internal Anal Sphincter 2/5  External Anal Sphincter 3/5  Puborectalis 3/5  Diastasis Recti 2 finger width diastasis  (Blank rows = not tested)        TONE: High in bil puborectalis  PROLAPSE: Grade 1 anterior/posterior vaginal wall laxity observed in supine in the morning   TODAY'S TREATMENT:  DATE:  09/03/23 Manual: Ileocecal valve stimulation x10 in supine ILU massage in supine  Bowel mobilization supine Rectal mobilization supine Therapeutic activities: Messaged MD about management of constipation Bowel retraining program - attempting bowel movement after meals Increasing water intake Trying warm fluids with meals to help stimulate bowel movements    07/01/23  EVAL   Neuromuscular re-education: Pt provides verbal consent for internal vaginal/rectal pelvic floor exam. Internal rectal and vaginal pelvic floor muscle contraction training Quick flicks Long holds Exercises: Single knee to chest Lower trunk rotation Seated forward  fold    PATIENT EDUCATION:  Education details: See above Person educated: Patient Education method: Explanation, Demonstration, Tactile cues, Verbal cues, and Handouts Education comprehension: verbalized understanding  HOME EXERCISE PROGRAM: [id number]  ASSESSMENT:  CLINICAL IMPRESSION: Pt has had symptoms all over in the last several months. She did a cleanse that was helpful in removing stool burden, but did not remain on miralax and started fiber, stopping bowel movements for the last week. We have reached out to referring physician to see if it would be ok to help guide patient through laxative and fiber use depending on her symptoms. We reviewed bowel retraining and abdominal mobilization techniques today with good tolerance. Next week we will return to strengthening activities to help give her improved control over stool. She will continue to benefit from skilled PT intervention in order to improve ease/complete emptying of bowel movements, increase bowel movement frequency, address all impairments, and progress functional strengthening program.   OBJECTIVE IMPAIRMENTS: decreased activity tolerance, decreased coordination, decreased endurance, decreased mobility, decreased ROM, decreased strength, increased fascial restrictions, increased muscle spasms, impaired flexibility, impaired tone, improper body mechanics, postural dysfunction, and pain.   ACTIVITY LIMITATIONS: continence and bowel movements   PARTICIPATION LIMITATIONS: cleaning, interpersonal relationship, and community activity  PERSONAL FACTORS: 3+ comorbidities: medical history are also affecting patient's functional outcome.   REHAB POTENTIAL: Good  CLINICAL DECISION MAKING: Evolving/moderate complexity  EVALUATION COMPLEXITY: Moderate   GOALS: Goals reviewed with patient? Yes  SHORT TERM GOALS: Updated 09/03/2023   Pt will be independent with HEP.   Baseline: Goal status: IN PROGRESS 09/03/23  2.  Pt will  report increase in bowel movement frequency to 3x/week in order to decrease fecal incontinence for improved odor and skin hygiene.  Baseline:  Goal status: IN PROGRESS 09/03/23  3.  Pt will improve consistency of bowel movements to 4-5 on Bristol Stool Scale in order to more completely empty and decrease fecal incontinence.  Baseline:  Goal status: IN PROGRESS 09/03/23  4.  Pt will report 25% improvement in fecal incontinence with passing gas.  Baseline:  Goal status: IN PROGRESS 09/03/23  5.  Pt will be independent with splinting. Baseline:  Goal status: IN PROGRESS 09/03/23  6.  Pt will be independent with enema use if needed and report 0 episodes of digging out stool in order to help improve rectal sensation/ease of bowel movements Baseline:  Goal status: IN PROGRESS 09/03/23  LONG TERM GOALS: Updated 09/03/23  Pt will be independent with advanced HEP.   Baseline:  Goal status: IN PROGRESS 09/03/23  2.  Pt will report 75% improvement in fecal continence with passing gas.  Baseline:  Goal status: IN PROGRESS 09/03/23  3.  Pt will report increase in bowel movement frequency to 6x/week in order to decrease fecal incontinence for improved odor and skin hygiene.  Baseline:  Goal status: IN PROGRESS 09/03/23  4.  Pt will demonstrate appropriate intrarectal pressure generation in order to  completely empty with bowel movements.  Baseline:  Goal status: IN PROGRESS 09/03/23  5.  Pt will demonstrate no painful or increased puborectalis tone in order to allow complete evacuation of bowel movements.  Baseline:  Goal status: IN PROGRESS 09/03/23  6.  Pt will report no leaks with laughing, coughing, sneezing in order to improve comfort with interpersonal relationships and community activities.   Baseline:  Goal status: IN PROGRESS 09/03/23  PLAN:  PT FREQUENCY: 1-2x/week  PT DURATION: 6 months  PLANNED INTERVENTIONS: 97110-Therapeutic exercises, 97530- Therapeutic activity, 97112-  Neuromuscular re-education, 97535- Self Care, [id number]- Manual therapy, Dry Needling, and Biofeedback  PLAN FOR NEXT SESSION: Rehabilitative ultrasound imaging (RUSI), manual techniques to abdomen/back/hips, review any messages from MD about clean out/laxative program, mobility activities and core strengthening.   MD correspondence:  Monica,  Thank you very much for your time and help!!  I am totally fine with your making whatever changes in patient's bowel regimen you deem appropriate.  Thanks again!!       Previous Messages    ----- Message ----- From: Russell Josette LABOR, PT Sent: 09/03/2023   9:52 AM EDT To: Elsie Cree, MD Subject: continued constipation management              Hi Dr. Cree,  I am seeing this patient back in pelvic floor PT for the first time since we were messaging before. She has just had a lot going on and has not been able to make it back before now. She filled me in on what she has been doing for her constipation, but is now back to where she was and has not had a bowel movement in about a week.  She states that the clean out you had her do was helpful, but when she started the Miralax at the end of her cleanse, she was having fecal incontinence. She told me at this time she completely stopped the Miralax and started taking fiber. She had one great bowel movement and then nothing since (1 week). She also states that doing a clean out in this way was really hard for her and she would prefer to do enemas if that is an option.  Since she likely has a stool burden again, I was wondering if you would be ok if we started doing enemas and Miralax, possibly with fiber as well. Based on her current symptoms and length of time since her last bowel movement, I would recommend starting Miralax again on a daily basis and then perform an enema each night if she does not have a bowel movement each day. As long as she has a bowel movement, I would not have her do an enema. I would  have her keep doing the Miralax consistently and then play with fiber in her to help decrease her fecal incontinence. We juust need written permission in PT in order to change these things and titrate based on frequency, incontinence, and Bristol Stool Scale. Would you be ok with this plan? And while we are working on this we would also continue our work on pelvic floor muscle strengthening and intra-rectal pressure generation.  Thank you so much for your help!  Monica Russell  Josette Russell, PT, DPT07/11/2508:13 AM

## 2023-09-05 ENCOUNTER — Encounter (HOSPITAL_COMMUNITY): Payer: Self-pay

## 2023-09-05 ENCOUNTER — Emergency Department (HOSPITAL_COMMUNITY)

## 2023-09-05 ENCOUNTER — Inpatient Hospital Stay (HOSPITAL_COMMUNITY)
Admission: EM | Admit: 2023-09-05 | Discharge: 2023-09-08 | DRG: 281 | Disposition: A | Discharge status: Home / Self Care | Attending: Family Medicine | Admitting: Family Medicine

## 2023-09-05 ENCOUNTER — Inpatient Hospital Stay (HOSPITAL_COMMUNITY)

## 2023-09-05 ENCOUNTER — Other Ambulatory Visit: Payer: Self-pay

## 2023-09-05 DIAGNOSIS — E538 Deficiency of other specified B group vitamins: Secondary | ICD-10-CM | POA: Diagnosis not present

## 2023-09-05 DIAGNOSIS — Z79890 Hormone replacement therapy (postmenopausal): Secondary | ICD-10-CM

## 2023-09-05 DIAGNOSIS — Z823 Family history of stroke: Secondary | ICD-10-CM

## 2023-09-05 DIAGNOSIS — R079 Chest pain, unspecified: Secondary | ICD-10-CM

## 2023-09-05 DIAGNOSIS — Z885 Allergy status to narcotic agent status: Secondary | ICD-10-CM

## 2023-09-05 DIAGNOSIS — H8109 Meniere's disease, unspecified ear: Secondary | ICD-10-CM | POA: Diagnosis present

## 2023-09-05 DIAGNOSIS — G319 Degenerative disease of nervous system, unspecified: Secondary | ICD-10-CM | POA: Diagnosis not present

## 2023-09-05 DIAGNOSIS — Z880 Allergy status to penicillin: Secondary | ICD-10-CM | POA: Diagnosis not present

## 2023-09-05 DIAGNOSIS — I214 Non-ST elevation (NSTEMI) myocardial infarction: Secondary | ICD-10-CM

## 2023-09-05 DIAGNOSIS — R0789 Other chest pain: Secondary | ICD-10-CM | POA: Diagnosis not present

## 2023-09-05 DIAGNOSIS — Z888 Allergy status to other drugs, medicaments and biological substances status: Secondary | ICD-10-CM

## 2023-09-05 DIAGNOSIS — Z8041 Family history of malignant neoplasm of ovary: Secondary | ICD-10-CM

## 2023-09-05 DIAGNOSIS — Z87891 Personal history of nicotine dependence: Secondary | ICD-10-CM

## 2023-09-05 DIAGNOSIS — R404 Transient alteration of awareness: Secondary | ICD-10-CM | POA: Diagnosis not present

## 2023-09-05 DIAGNOSIS — Z79899 Other long term (current) drug therapy: Secondary | ICD-10-CM

## 2023-09-05 DIAGNOSIS — E782 Mixed hyperlipidemia: Secondary | ICD-10-CM | POA: Diagnosis not present

## 2023-09-05 DIAGNOSIS — F419 Anxiety disorder, unspecified: Secondary | ICD-10-CM | POA: Diagnosis present

## 2023-09-05 DIAGNOSIS — I959 Hypotension, unspecified: Secondary | ICD-10-CM | POA: Diagnosis not present

## 2023-09-05 DIAGNOSIS — Z8249 Family history of ischemic heart disease and other diseases of the circulatory system: Secondary | ICD-10-CM

## 2023-09-05 DIAGNOSIS — Z881 Allergy status to other antibiotic agents status: Secondary | ICD-10-CM | POA: Diagnosis not present

## 2023-09-05 DIAGNOSIS — J449 Chronic obstructive pulmonary disease, unspecified: Secondary | ICD-10-CM | POA: Diagnosis present

## 2023-09-05 DIAGNOSIS — R7989 Other specified abnormal findings of blood chemistry: Secondary | ICD-10-CM | POA: Diagnosis not present

## 2023-09-05 DIAGNOSIS — E785 Hyperlipidemia, unspecified: Secondary | ICD-10-CM | POA: Diagnosis not present

## 2023-09-05 DIAGNOSIS — R110 Nausea: Secondary | ICD-10-CM | POA: Diagnosis not present

## 2023-09-05 DIAGNOSIS — R29818 Other symptoms and signs involving the nervous system: Secondary | ICD-10-CM | POA: Diagnosis not present

## 2023-09-05 DIAGNOSIS — Z743 Need for continuous supervision: Secondary | ICD-10-CM | POA: Diagnosis not present

## 2023-09-05 DIAGNOSIS — Z882 Allergy status to sulfonamides status: Secondary | ICD-10-CM

## 2023-09-05 DIAGNOSIS — Z803 Family history of malignant neoplasm of breast: Secondary | ICD-10-CM

## 2023-09-05 DIAGNOSIS — K5190 Ulcerative colitis, unspecified, without complications: Secondary | ICD-10-CM | POA: Diagnosis present

## 2023-09-05 DIAGNOSIS — I10 Essential (primary) hypertension: Secondary | ICD-10-CM | POA: Diagnosis present

## 2023-09-05 DIAGNOSIS — R6889 Other general symptoms and signs: Secondary | ICD-10-CM | POA: Diagnosis not present

## 2023-09-05 DIAGNOSIS — E118 Type 2 diabetes mellitus with unspecified complications: Secondary | ICD-10-CM | POA: Diagnosis present

## 2023-09-05 DIAGNOSIS — E1140 Type 2 diabetes mellitus with diabetic neuropathy, unspecified: Secondary | ICD-10-CM | POA: Diagnosis present

## 2023-09-05 DIAGNOSIS — Z7951 Long term (current) use of inhaled steroids: Secondary | ICD-10-CM

## 2023-09-05 DIAGNOSIS — Z833 Family history of diabetes mellitus: Secondary | ICD-10-CM

## 2023-09-05 DIAGNOSIS — I2510 Atherosclerotic heart disease of native coronary artery without angina pectoris: Secondary | ICD-10-CM | POA: Diagnosis not present

## 2023-09-05 DIAGNOSIS — K219 Gastro-esophageal reflux disease without esophagitis: Secondary | ICD-10-CM | POA: Diagnosis not present

## 2023-09-05 DIAGNOSIS — A419 Sepsis, unspecified organism: Secondary | ICD-10-CM | POA: Diagnosis not present

## 2023-09-05 DIAGNOSIS — J4489 Other specified chronic obstructive pulmonary disease: Secondary | ICD-10-CM | POA: Diagnosis present

## 2023-09-05 DIAGNOSIS — R42 Dizziness and giddiness: Principal | ICD-10-CM

## 2023-09-05 DIAGNOSIS — G43009 Migraine without aura, not intractable, without status migrainosus: Secondary | ICD-10-CM | POA: Diagnosis present

## 2023-09-05 DIAGNOSIS — M797 Fibromyalgia: Secondary | ICD-10-CM | POA: Diagnosis not present

## 2023-09-05 DIAGNOSIS — R918 Other nonspecific abnormal finding of lung field: Secondary | ICD-10-CM | POA: Diagnosis not present

## 2023-09-05 DIAGNOSIS — Z7982 Long term (current) use of aspirin: Secondary | ICD-10-CM

## 2023-09-05 DIAGNOSIS — E063 Autoimmune thyroiditis: Secondary | ICD-10-CM | POA: Diagnosis present

## 2023-09-05 DIAGNOSIS — E039 Hypothyroidism, unspecified: Secondary | ICD-10-CM | POA: Diagnosis present

## 2023-09-05 LAB — TROPONIN I (HIGH SENSITIVITY)
Troponin I (High Sensitivity): 508 ng/L (ref <18)
Troponin I (High Sensitivity): 542 ng/L (ref <18)
Troponin I (High Sensitivity): 441 ng/L (ref <18)
Troponin I (High Sensitivity): 409 ng/L (ref <18)

## 2023-09-05 LAB — COMPREHENSIVE METABOLIC PANEL WITH GFR
ALT: 11 U/L (ref 0–44)
AST: 21 U/L (ref 15–41)
Albumin: 3.2 g/dL — ABNORMAL LOW (ref 3.5–5.0)
Alkaline Phosphatase: 61 U/L (ref 38–126)
Anion gap: 9 (ref 5–15)
BUN: 21 mg/dL (ref 8–23)
CO2: 28 mmol/L (ref 22–32)
Calcium: 9.6 mg/dL (ref 8.9–10.3)
Chloride: 102 mmol/L (ref 98–111)
Creatinine, Ser: 0.68 mg/dL (ref 0.44–1.00)
GFR, Estimated: >60 mL/min (ref >60)
Glucose, Bld: 138 mg/dL — ABNORMAL HIGH (ref 70–99)
Potassium: 4 mmol/L (ref 3.5–5.1)
Sodium: 139 mmol/L (ref 135–145)
Total Bilirubin: 1 mg/dL (ref 0.0–1.2)
Total Protein: 5.5 g/dL — ABNORMAL LOW (ref 6.5–8.1)

## 2023-09-05 LAB — CBC
HCT: 35.3 % — ABNORMAL LOW (ref 36.0–46.0)
Hemoglobin: 11.6 g/dL — ABNORMAL LOW (ref 12.0–15.0)
MCH: 31.4 pg (ref 26.0–34.0)
MCHC: 32.9 g/dL (ref 30.0–36.0)
MCV: 95.4 fL (ref 80.0–100.0)
Platelets: 282 K/uL (ref 150–400)
RBC: 3.7 MIL/uL — ABNORMAL LOW (ref 3.87–5.11)
RDW: 12.4 % (ref 11.5–15.5)
WBC: 8.7 K/uL (ref 4.0–10.5)
nRBC: 0 % (ref 0.0–0.2)

## 2023-09-05 LAB — D-DIMER, QUANTITATIVE: D-Dimer, Quant: 0.27 ug{FEU}/mL (ref 0.00–0.50)

## 2023-09-05 LAB — I-STAT CG4 LACTIC ACID, ED: Lactic Acid, Venous: 0.7 mmol/L (ref 0.5–1.9)

## 2023-09-05 LAB — ECHOCARDIOGRAM COMPLETE
Area-P 1/2: 4.21 cm2
Height: 64 in
S' Lateral: 2.6 cm
Weight: 2469.15 [oz_av]

## 2023-09-05 LAB — HEPARIN LEVEL (UNFRACTIONATED): Heparin Unfractionated: 0.73 [IU]/mL — ABNORMAL HIGH (ref 0.30–0.70)

## 2023-09-05 MED ORDER — SODIUM CHLORIDE 0.9% FLUSH
3.0000 mL | Freq: Two times a day (BID) | INTRAVENOUS | Status: DC
  Administered 2023-09-07: 10 mL via INTRAVENOUS

## 2023-09-05 MED ORDER — ONDANSETRON HCL 4 MG/2ML IJ SOLN
4.0000 mg | Freq: Once | INTRAMUSCULAR | Status: AC
  Administered 2023-09-05: 4 mg via INTRAVENOUS
  Filled 2023-09-05: qty 2

## 2023-09-05 MED ORDER — ASPIRIN 81 MG PO CHEW: 81.0000 mg | CHEWABLE_TABLET | ORAL | Status: DC

## 2023-09-05 MED ORDER — SODIUM CHLORIDE 0.9% FLUSH: 3.0000 mL | Freq: Two times a day (BID) | INTRAVENOUS | Status: DC

## 2023-09-05 MED ORDER — SODIUM CHLORIDE 0.9% FLUSH
3.0000 mL | Freq: Two times a day (BID) | INTRAVENOUS | Status: DC
  Administered 2023-09-07 (×2): 3 mL via INTRAVENOUS

## 2023-09-05 MED ORDER — HEPARIN BOLUS VIA INFUSION
4000.0000 [IU] | Freq: Once | INTRAVENOUS | Status: AC
  Administered 2023-09-05: 4000 [IU] via INTRAVENOUS
  Filled 2023-09-05: qty 4000

## 2023-09-05 MED ORDER — HEPARIN (PORCINE) 25000 UT/250ML-% IV SOLN
800.0000 [IU]/h | INTRAVENOUS | Status: DC
  Administered 2023-09-05: 900 [IU]/h via INTRAVENOUS
  Administered 2023-09-06 – 2023-09-07 (×2): 800 [IU]/h via INTRAVENOUS
  Filled 2023-09-05 (×3): qty 250

## 2023-09-05 MED ORDER — SODIUM CHLORIDE 0.9 % IV SOLN
250.0000 mL | INTRAVENOUS | Status: AC | PRN
Start: 2023-09-05 — End: 2023-09-06

## 2023-09-05 MED ORDER — SODIUM CHLORIDE 0.9 % IV SOLN: INTRAVENOUS | Status: DC

## 2023-09-05 MED ORDER — SODIUM CHLORIDE 0.9% FLUSH: 3.0000 mL | INTRAVENOUS | Status: DC | PRN

## 2023-09-05 MED ORDER — LORAZEPAM 2 MG/ML IJ SOLN
1.0000 mg | Freq: Once | INTRAMUSCULAR | Status: AC
  Administered 2023-09-05: 1 mg via INTRAVENOUS
  Filled 2023-09-05: qty 1

## 2023-09-05 MED ORDER — MECLIZINE HCL 25 MG PO TABS
50.0000 mg | ORAL_TABLET | Freq: Once | ORAL | Status: AC
  Administered 2023-09-05: 50 mg via ORAL
  Filled 2023-09-05: qty 2

## 2023-09-05 MED ORDER — FAMOTIDINE IN NACL 20-0.9 MG/50ML-% IV SOLN
20.0000 mg | Freq: Once | INTRAVENOUS | Status: AC
  Administered 2023-09-05: 20 mg via INTRAVENOUS
  Filled 2023-09-05: qty 50

## 2023-09-05 MED ORDER — IOHEXOL 350 MG/ML SOLN
50.0000 mL | Freq: Once | INTRAVENOUS | Status: AC | PRN
  Administered 2023-09-05: 50 mL via INTRAVENOUS

## 2023-09-05 NOTE — ED Notes (Signed)
 Help get patient straighten up in the bed patient has family at bedside

## 2023-09-05 NOTE — ED Provider Notes (Signed)
 7:13 AM Care assumed from Dr. Theadore.  Time of transfer of care, patient is awaiting consultation by cardiology for chest pain and a positive troponin and is also waiting for MRI given the persistent dizziness.  Due to the suspected NSTEMI with elevated troponin, anticipate she will need admission to medicine or cardiology when workup is further.   I spoke to cardiology recommends medicine admission.  They will leave their recommendations.  MRI did not show acute stroke with the dizziness and vertigo symptoms.  Troponin was rising.  Will call for admission.   Clinical Impression: 1. Vertigo   2. Dizziness   3. Chest pain, unspecified type   4. Elevated troponin     Disposition: Admit  This note was prepared with assistance of Dragon voice recognition software. Occasional wrong-word or sound-a-like substitutions may have occurred due to the inherent limitations of voice recognition software.      Shelby Lin, Lonni PARAS, MD 09/05/23 [phone]

## 2023-09-05 NOTE — ED Notes (Signed)
Attempted to call report to 3E 

## 2023-09-05 NOTE — ED Provider Notes (Signed)
 [facility] Emergency Department Provider Note MRN:  [id number]  Arrival date & time: 09/05/23     Chief Complaint   Dizziness   History of Present Illness   Shelby Lin is a 83 y.o. year-old female with a history of COPD, diabetes, fibromyalgia, Hashimoto's, Mnire's disease presenting to the ED with chief complaint of dizziness.  Woke up and got out of bed to use the bathroom.  Upon turning of her head she developed sudden onset dizziness described as a room spinning sensation.  Was associated with vomiting.  Had to crawl to the bathroom.  Symptoms continued, consistently triggered by head movement.  Denies numbness or weakness to the arms or legs, no trouble with speech or vision, no pain.  Review of Systems  A thorough review of systems was obtained and all systems are negative except as noted in the HPI and PMH.   Patient's Health History    Past Medical History:  Diagnosis Date   Anxiety    Arthritis    Asthma in child    Atherosclerotic heart disease of native coronary artery without angina pectoris    Cataracts, bilateral    Combined hyperlipidemia    Constipation, unspecified    COPD (chronic obstructive pulmonary disease) (HCC)    Coronary artery spasm (HCC)    Diabetic neuropathy (HCC)    DM (diabetes mellitus) with complications (HCC)    Dupuytren's contracture    Dyspepsia    Familial tremor    Fatigue    Fibromyalgia syndrome    GERD (gastroesophageal reflux disease)    Hashimoto's thyroiditis    Hiatal hernia    History of colon polyps    Hx of Hashimoto thyroiditis    Hypercholesteremia    Hypertension    Hypothyroidism, acquired, autoimmune    IBS (irritable bowel syndrome)    Meniere's disease of left ear    Migraine without aura and without status migrainosus, not intractable    Multinodular goiter (nontoxic)    Nonexudative macular degeneration    Raynaud's syndrome without gangrene    Situational stress     Tachycardia    Thyroiditis, autoimmune    Ulcerative colitis, unspecified, without complications (HCC)    Vitamin B 12 deficiency    Vitamin D  deficiency    Wears glasses     Past Surgical History:  Procedure Laterality Date   ANAL RECTAL MANOMETRY Bilateral 11/26/2022   Procedure: ANO RECTAL MANOMETRY;  Surgeon: Burnette Fallow, MD;  Location: [facility];  Service: Gastroenterology;  Laterality: Bilateral;   BREAST EXCISIONAL BIOPSY Left    benign   BREAST EXCISIONAL BIOPSY Right    milk gland removed   BREAST LUMPECTOMY Left 1990   benign   BREAST LUMPECTOMY WITH NEEDLE LOCALIZATION Right 02/28/2013   Procedure: RIGHT BREAST NEEDLE LOCALIZATION LUMPECTOMY ;  Surgeon: Morene ONEIDA Olives, MD;  Location: [facility];  Service: General;  Laterality: Right;   CHOLECYSTECTOMY  2008   lapcholi   DILATION AND CURETTAGE OF UTERUS     LEFT HEART CATHETERIZATION WITH CORONARY ANGIOGRAM N/A 01/26/2013   Procedure: LEFT HEART CATHETERIZATION WITH CORONARY ANGIOGRAM;  Surgeon: Oneil Parchment, MD;  Location: [facility] CATH LAB;  Service: Cardiovascular;  Laterality: N/A;   PARTIAL HYSTERECTOMY  1971   TONSILLECTOMY      Family History  Problem Relation Age of Onset   Breast cancer Mother 24   Hypertension Mother    Stroke Mother    Heart attack Father  Sudden death Father    Hypertension Sister    Diabetes Sister    Stroke Sister    Breast cancer Paternal Aunt 83 - 76   Ovarian cancer Paternal Aunt 1 - 75   Thyroid  disease Neg Hx     Social History   Socioeconomic History   Marital status: Married    Spouse name: edward   Number of children: 2   Years of education: 12th   Highest education level: Not on file  Occupational History    Employer: [employer]  Tobacco Use   Smoking status: Former    Current packs/day: 0.00    Average packs/day: 0.5 packs/day for 42.0 years (21.0 ttl pk-yrs)    Types: Cigarettes    Start date: 02/22/1959    Quit date:  02/21/2001    Years since quitting: 22.5   Smokeless tobacco: Never  Vaping Use   Vaping status: Never Used  Substance and Sexual Activity   Alcohol use: No   Drug use: No   Sexual activity: Not on file  Other Topics Concern   Not on file  Social History Narrative   Patient lives at home with the husband.. Patient drinks coffee   Social Drivers of Corporate investment banker Strain: Not on file  Food Insecurity: Low Risk  (07/23/2022)   Received from [facility]   Hunger Vital Sign    Within the past 12 months, you worried that your food would run out before you got money to buy more: Never true    Within the past 12 months, the food you bought just didn't last and you didn't have money to get more. : Never true  Transportation Needs: Not on file (07/23/2022)  Physical Activity: Not on file  Stress: Not on file  Social Connections: Not on file  Intimate Partner Violence: Not on file     Physical Exam   Vitals:   09/05/23 0530 09/05/23 0600  BP: 105/65 102/62  Pulse: 85 85  Resp: 15 17  Temp:    SpO2: 97% 95%    CONSTITUTIONAL: Well-appearing, NAD NEURO/PSYCH:  Alert and oriented x 3, normal and symmetric strength and sensation, normal coordination, normal speech EYES:  eyes equal and reactive ENT/NECK:  no LAD, no JVD CARDIO: Regular rate, well-perfused, normal S1 and S2 PULM:  CTAB no wheezing or rhonchi GI/GU:  non-distended, non-tender MSK/SPINE:  No gross deformities, no edema SKIN:  no rash, atraumatic   *Additional and/or pertinent findings included in MDM below  Diagnostic and Interventional Summary    EKG Interpretation Date/Time:  Saturday September 05 2023 04:27:39 EDT Ventricular Rate:  84 PR Interval:  165 QRS Duration:  94 QT Interval:  385 QTC Calculation: 456 R Axis:   60  Text Interpretation: Sinus rhythm Ventricular premature complex Low voltage, precordial leads Confirmed by Theadore Sharper [phone]) on 09/05/2023 5:17:30 AM       Labs  Reviewed  CBC - Abnormal; Notable for the following components:      Result Value   RBC 3.70 (*)    Hemoglobin 11.6 (*)    HCT 35.3 (*)    All other components within normal limits  COMPREHENSIVE METABOLIC PANEL WITH GFR - Abnormal; Notable for the following components:   Glucose, Bld 138 (*)    Total Protein 5.5 (*)    Albumin 3.2 (*)    All other components within normal limits  TROPONIN I (HIGH SENSITIVITY)    MR BRAIN WO CONTRAST    (  Results Pending)    Medications  LORazepam  (ATIVAN ) injection 1 mg (has no administration in time range)  meclizine  (ANTIVERT ) tablet 50 mg (50 mg Oral Given 09/05/23 0427)  ondansetron  (ZOFRAN ) injection 4 mg (4 mg Intravenous Given 09/05/23 0548)  famotidine  (PEPCID ) IVPB 20 mg premix (20 mg Intravenous New Bag/Given 09/05/23 0548)     Procedures  /  Critical Care Procedures  ED Course and Medical Decision Making  Initial Impression and Ddx Seems consistent with peripheral vertigo based on description and given patient's history of peripheral vertigo in the past, history of Mnire's disease.  She has a very normal neurological exam.  Symptoms are improving, mild at this time.  Providing meclizine  and will reassess.  Obtaining screening EKG and labs to evaluate for arrhythmia, electrolyte disturbance.  Past medical/surgical history that increases complexity of ED encounter: None  Interpretation of Diagnostics I personally reviewed the EKG and my interpretation is as follows: Sinus rhythm  No significant blood count or electrolyte disturbance.  Patient Reassessment and Ultimate Disposition/Management     Persistent constant vertigo.  The emergency department without relief from meclizine .  Obtaining MRI to exclude signs of stroke.  Signed out to oncoming provider at shift change.  Patient management required discussion with the following services or consulting groups:  None  Complexity of Problems Addressed Acute illness or injury that  poses threat of life of bodily function  Additional Data Reviewed and Analyzed Further history obtained from: Further history from spouse/family member  Additional Factors Impacting ED Encounter Risk Consideration of hospitalization  Ozell HERO. Theadore, MD [facility] Emergency Medicine [facility] [email or web address]  Final Clinical Impressions(s) / ED Diagnoses     ICD-10-CM   1. Vertigo  R42       ED Discharge Orders     None        Discharge Instructions Discussed with and Provided to Patient:   Discharge Instructions   None      Theadore Ozell HERO, MD 09/05/23 0630

## 2023-09-05 NOTE — Progress Notes (Addendum)
 PHARMACY - ANTICOAGULATION CONSULT NOTE  Pharmacy Consult for heparin  Indication: chest pain/ACS  Allergies  Allergen Reactions   Acyclovir And Related Other (See Comments)    Unknown    Allegra [Fexofenadine] Hives   Amoxicillin Hives   Codeine Nausea And Vomiting   Elemental Sulfur Hives   Enalapril Maleate Cough   Hydrocodone  Bit-Homatrop Mbr Other (See Comments)    Unknown    Hydrocodone -Acetaminophen  Other (See Comments)    Unknown    Keflex [Cephalexin] Hives   Levofloxacin Other (See Comments)    insomnia   Lorabid [Loracarbef] Hives   Misc. Sulfonamide Containing Compounds Hives   Sulfa Antibiotics Hives   Sulfur Hives    Patient Measurements: Height: 5' 4 (162.6 cm) Weight: 66.7 kg (147 lb 0.8 oz) IBW/kg (Calculated) : 54.7 HEPARIN  DW (KG): 66.7  Vital Signs: Temp: 98.8 F (37.1 C) (07/12 1726) Temp Source: Oral (07/12 1726) BP: 121/66 (07/12 1726) Pulse Rate: 91 (07/12 1726)  Labs: Recent Labs    09/05/23 0430 09/05/23 0547 09/05/23 0722 09/05/23 1209 09/05/23 1415 09/05/23 2057  HGB 11.6*  --   --   --   --   --   HCT 35.3*  --   --   --   --   --   PLT 282  --   --   --   --   --   HEPARINUNFRC  --   --   --   --   --  0.73*  CREATININE 0.68  --   --   --   --   --   TROPONINIHS  --    < > 542* 441* 409*  --    < > = values in this interval not displayed.    Estimated Creatinine Clearance: 50 mL/min (by C-G formula based on SCr of 0.68 mg/dL).   Medical History: Past Medical History:  Diagnosis Date   Anxiety    Arthritis    Asthma in child    Atherosclerotic heart disease of native coronary artery without angina pectoris    Cataracts, bilateral    Combined hyperlipidemia    Constipation, unspecified    COPD (chronic obstructive pulmonary disease) (HCC)    Coronary artery spasm (HCC)    Diabetic neuropathy (HCC)    DM (diabetes mellitus) with complications (HCC)    Dupuytren's contracture    Dyspepsia    Familial tremor     Fatigue    Fibromyalgia syndrome    GERD (gastroesophageal reflux disease)    Hashimoto's thyroiditis    Hiatal hernia    History of colon polyps    Hx of Hashimoto thyroiditis    Hypercholesteremia    Hypertension    Hypothyroidism, acquired, autoimmune    IBS (irritable bowel syndrome)    Meniere's disease of left ear    Migraine without aura and without status migrainosus, not intractable    Multinodular goiter (nontoxic)    Nonexudative macular degeneration    Raynaud's syndrome without gangrene    Situational stress    Tachycardia    Thyroiditis, autoimmune    Ulcerative colitis, unspecified, without complications (HCC)    Vitamin B 12 deficiency    Vitamin D  deficiency    Wears glasses     Assessment: 70 YOF presenting with syncope, elevated troponin, she is not on anticoagulation PTA.  7/12 PM: HL supra-therapeutic at 0.72, Hgb 11.6, Plt 282. No s/sx of bleeding noted.  Goal of Therapy:  Heparin  level 0.3-0.7 units/ml Monitor platelets  by anticoagulation protocol: Yes   Plan:  Decrease heparin  rate to 800 units/hr F/u 8 hour heparin  level, daily CBCs ordered  F/u cards eval and recs  R. Samual Satterfield, PharmD PGY-1 Acute Care Pharmacy Resident [facility] 09/05/2023 10:02 PM

## 2023-09-05 NOTE — Progress Notes (Signed)
 Patient admitted from the ed with dizziness and elevated troponin and generalized weakness. Patient is a/0 x4. Patient uses walker to ambulate at home. Patient uses home 02 at 2l every hs. No skin issues noted. Patient oriented to bed room and call bell. Patient denies pain or shortness of breath. MP shows NSR with pvc's and pac's. IV heparin  drip infusing per order.

## 2023-09-05 NOTE — H&P (Signed)
 History and Physical    Patient: Shelby Lin FMW:[id number] DOB: Jul 03, 1940 DOA: 09/05/2023 DOS: the patient was seen and examined on 09/05/2023 . PCP: Claudene Pellet, MD  Patient coming from: Home Chief complaint: Chief Complaint  Patient presents with   Dizziness   HPI:  Shelby Lin is a 83 y.o. female with past medical history  of allergies to multiple medications a total of 14 please see the complete list, COPD followed by pulmonology,Anxiety, Arthritis, Asthma in child, Atherosclerotic heart disease of native coronary artery without angina pectoris, Cataracts, bilateral, Combined hyperlipidemia, Constipation, unspecified, COPD (chronic obstructive pulmonary disease) , Coronary artery spasm , Diabetic neuropathy , DM (diabetes mellitus) with complications , Dupuytren's contracture, Dyspepsia, Familial tremor, Fatigue, Fibromyalgia syndrome, GERD (gastroesophageal reflux disease), Hashimoto's thyroiditis, Hiatal hernia, History of colon polyps, Hashimoto thyroiditis, Hypercholesteremia, Hypertension, Hypothyroidism, acquired, autoimmune, IBS (irritable bowel syndrome), Meniere's disease of left ear, Migraine without aura and without status migrainosus, not intractable, Multinodular goiter (nontoxic), Nonexudative macular degeneration, Raynaud's syndrome without gangrene, Situational stress, Tachycardia, Thyroiditis, autoimmune, Ulcerative colitis, unspecified, without complications , Vitamin B 12 deficiency, Vitamin D  deficiency, and Wears glasses presenting today with dizziness and nausea and vomiting.  Per nurses note last known normal was 2200 yesterday.  Patient noticed feeling dizzy when she was getting out of bed to go to the bathroom.  Associated room spinning sensation. Had to crawl to the bathroom.  ED Course:  Vital signs in the ED were notable for the following:  Vitals:   09/05/23 0645 09/05/23 0830 09/05/23 1100 09/05/23 1206  BP: 110/63 (!) 95/48 119/68 (!) 100/59  Pulse:  93 90  83  Temp:    97.7 F (36.5 C)  Resp: (!) 26 20 18 16   Height:      Weight:      SpO2: 96% 98%  96%  TempSrc:    Oral  BMI (Calculated):      BP since arrival: Vitals:   09/05/23 0230 09/05/23 0323 09/05/23 0430 09/05/23 0500  BP: 110/70 107/69 109/67 112/70   09/05/23 0530 09/05/23 0600 09/05/23 0645 09/05/23 0830  BP: 105/65 102/62 110/63 (!) 95/48   09/05/23 1100 09/05/23 1206  BP: 119/68 (!) 100/59  >>ED evaluation thus far shows: CMP showing glucose 138 albumin 3.2 normal LFTs otherwise. Troponin of 508 and repeat at 542. CBC shows anemia with a hemoglobin of 11.6 which seems to be chronic normal platelets and white count. D-dimer ordered and pending. Of the brain noncontrast done today was negative for any acute intracranial abnormality.   >>While in the ED patient received the following: Medications  meclizine  (ANTIVERT ) tablet 50 mg (50 mg Oral Given 09/05/23 0427)  ondansetron  (ZOFRAN ) injection 4 mg (4 mg Intravenous Given 09/05/23 0548)  famotidine  (PEPCID ) IVPB 20 mg premix (0 mg Intravenous Stopped 09/05/23 0630)  LORazepam  (ATIVAN ) injection 1 mg (1 mg Intravenous Given 09/05/23 0827)   Review of Systems  Gastrointestinal:  Positive for nausea and vomiting.  Musculoskeletal:  Positive for falls.  Neurological:  Positive for dizziness and weakness. Negative for loss of consciousness.   Past Medical History:  Diagnosis Date   Anxiety    Arthritis    Asthma in child    Atherosclerotic heart disease of native coronary artery without angina pectoris    Cataracts, bilateral    Combined hyperlipidemia    Constipation, unspecified    COPD (chronic obstructive pulmonary disease) (HCC)    Coronary artery spasm (HCC)    Diabetic neuropathy (HCC)  DM (diabetes mellitus) with complications (HCC)    Dupuytren's contracture    Dyspepsia    Familial tremor    Fatigue    Fibromyalgia syndrome    GERD (gastroesophageal reflux disease)    Hashimoto's thyroiditis     Hiatal hernia    History of colon polyps    Hx of Hashimoto thyroiditis    Hypercholesteremia    Hypertension    Hypothyroidism, acquired, autoimmune    IBS (irritable bowel syndrome)    Meniere's disease of left ear    Migraine without aura and without status migrainosus, not intractable    Multinodular goiter (nontoxic)    Nonexudative macular degeneration    Raynaud's syndrome without gangrene    Situational stress    Tachycardia    Thyroiditis, autoimmune    Ulcerative colitis, unspecified, without complications (HCC)    Vitamin B 12 deficiency    Vitamin D  deficiency    Wears glasses    Past Surgical History:  Procedure Laterality Date   ANAL RECTAL MANOMETRY Bilateral 11/26/2022   Procedure: ANO RECTAL MANOMETRY;  Surgeon: Burnette Fallow, MD;  Location: [facility];  Service: Gastroenterology;  Laterality: Bilateral;   BREAST EXCISIONAL BIOPSY Left    benign   BREAST EXCISIONAL BIOPSY Right    milk gland removed   BREAST LUMPECTOMY Left 1990   benign   BREAST LUMPECTOMY WITH NEEDLE LOCALIZATION Right 02/28/2013   Procedure: RIGHT BREAST NEEDLE LOCALIZATION LUMPECTOMY ;  Surgeon: Morene ONEIDA Olives, MD;  Location: [facility];  Service: General;  Laterality: Right;   CHOLECYSTECTOMY  2008   lapcholi   DILATION AND CURETTAGE OF UTERUS     LEFT HEART CATHETERIZATION WITH CORONARY ANGIOGRAM N/A 01/26/2013   Procedure: LEFT HEART CATHETERIZATION WITH CORONARY ANGIOGRAM;  Surgeon: Oneil Parchment, MD;  Location: [facility];  Service: Cardiovascular;  Laterality: N/A;   PARTIAL HYSTERECTOMY  1971   TONSILLECTOMY      reports that she quit smoking about 22 years ago. Her smoking use included cigarettes. She started smoking about 64 years ago. She has a 21 pack-year smoking history. She has never used smokeless tobacco. She reports that she does not drink alcohol and does not use drugs. Allergies  Allergen Reactions   Acyclovir And Related Other (See Comments)     Unknown    Allegra [Fexofenadine] Hives   Amoxicillin Hives   Codeine Nausea And Vomiting   Elemental Sulfur Hives   Enalapril Maleate Cough   Hydrocodone  Bit-Homatrop Mbr Other (See Comments)    Unknown    Hydrocodone -Acetaminophen  Other (See Comments)    Unknown    Keflex [Cephalexin] Hives   Levofloxacin Other (See Comments)    insomnia   Lorabid [Loracarbef] Hives   Misc. Sulfonamide Containing Compounds Hives   Sulfa Antibiotics Hives   Sulfur Hives   Family History  Problem Relation Age of Onset   Breast cancer Mother 35   Hypertension Mother    Stroke Mother    Heart attack Father    Sudden death Father    Hypertension Sister    Diabetes Sister    Stroke Sister    Breast cancer Paternal Aunt 64 - 24   Ovarian cancer Paternal Aunt 3 - 34   Thyroid  disease Neg Hx    Prior to Admission medications   Medication Sig Start Date End Date Taking? Authorizing Provider  LAGEVRIO 200 MG CAPS capsule Take 4 capsules by mouth 2 (two) times daily. 08/21/23  Yes [provider]  albuterol  (VENTOLIN  HFA) 108 (90 Base) MCG/ACT inhaler Inhale 1-2 puffs into the lungs every 6 (six) hours as needed for wheezing or shortness of breath. 03/09/23   Geronimo Amel, MD  aspirin  EC 81 MG tablet Take 1 tablet (81 mg total) by mouth daily. 06/28/18   Jeffrie Oneil BROCKS, MD  azithromycin  (ZITHROMAX ) 250 MG tablet Take 2 tablets on first day, then 1 tablet daily until finished. 07/22/22   Darlean Ozell NOVAK, MD  Baclofen  5 MG TABS Take 1 tablet (5 mg total) by mouth 3 (three) times daily as needed. 04/22/23   Cobb, Comer GAILS, NP  benzonatate  (TESSALON ) 200 MG capsule Take 1 capsule (200 mg total) by mouth 3 (three) times daily as needed for cough. 07/28/22   Cobb, Comer GAILS, NP  Cyanocobalamin  (VITAMIN B 12 PO) Take by mouth daily.    [provider]  DULoxetine  (CYMBALTA ) 30 MG capsule Take 30 mg by mouth 2 (two) times daily. 03/20/18   [provider]  fluticasone   (FLONASE ) 50 MCG/ACT nasal spray Place 1 spray into both nostrils daily. 02/26/21   Cobb, Comer GAILS, NP  furosemide (LASIX) 20 MG tablet Take 20 mg by mouth as needed.    [provider]  hydrochlorothiazide (HYDRODIURIL) 25 MG tablet TAKE 1 TABLET BY MOUTH EVERY DAY IN THE MORNING 12/30/17   [provider]  lidocaine  (LIDODERM ) 5 % Place 1 patch onto the skin daily. Remove & Discard patch within 12 hours or as directed by MD 04/22/23   Cobb, Comer GAILS, NP  LORazepam  (ATIVAN ) 0.5 MG tablet TAKE 1/2 TAB EVERY 8 12 HOURS AS NEEDED FOR ANXIOUSNESS 08/12/18   [provider]  losartan (COZAAR) 100 MG tablet Take 100 mg by mouth daily. 12/30/17   [provider]  lubiprostone (AMITIZA) 24 MCG capsule 1 capsule with food and water Orally once a day; Duration: 90 days    [provider]  meclizine  (ANTIVERT ) 25 MG tablet 1/2 to 1 tablet by mouth every 8 hrs as needed for diziness    [provider]  Multiple Vitamin (MULTIVITAMIN) tablet Take 1 tablet by mouth daily.    [provider]  omeprazole (PRILOSEC) 20 MG capsule Take 20 mg by mouth daily.    [provider]  predniSONE  (DELTASONE ) 10 MG tablet 4 tabs for 3 days, then 3 tabs for 3 days, 2 tabs for 3 days, then 1 tab for 3 days, then stop 05/06/23   Cobb, Comer GAILS, NP  rosuvastatin  (CRESTOR ) 20 MG tablet Take 1 tablet (20 mg total) by mouth daily. 06/28/18   Jeffrie Oneil BROCKS, MD  SYNTHROID  88 MCG tablet TAKE DAILY BEFORE BREAKFAST. 1 TABLET 6 DAYS EACH WEEK, BUT TAKE ONLY 1/2 TABLET ON THE 7TH DAY 05/02/21   Hershal Ozell PARAS, MD  TRELEGY ELLIPTA  100-62.5-25 MCG/ACT AEPB INHALE 1 PUFF BY MOUTH EVERY DAY 08/18/23   Cobb, Katherine V, NP  VITAMIN D , CHOLECALCIFEROL, PO Take 50,000 Units by mouth once a week.     [provider]  Vitals:   09/05/23 0645 09/05/23 0830 09/05/23 1100 09/05/23 1206   BP: 110/63 (!) 95/48 119/68 (!) 100/59  Pulse: 93 90  83  Resp: (!) 26 20 18 16   Temp:    97.7 F (36.5 C)  TempSrc:    Oral  SpO2: 96% 98%  96%  Weight:      Height:       Physical Exam Vitals and nursing note reviewed.  Constitutional:      General: She is not in acute distress. HENT:     Head: Normocephalic and atraumatic.     Right Ear: Hearing and external ear normal.     Left Ear: Hearing and external ear normal.     Nose: No nasal deformity.     Mouth/Throat:     Lips: Pink.  Eyes:     General: Lids are normal.     Extraocular Movements: Extraocular movements intact.     Pupils: Pupils are equal, round, and reactive to light.  Cardiovascular:     Rate and Rhythm: Normal rate and regular rhythm.     Pulses: Normal pulses.     Heart sounds: Normal heart sounds.  Pulmonary:     Effort: Pulmonary effort is normal.     Breath sounds: Rales present.  Abdominal:     General: Bowel sounds are normal. There is no distension.     Palpations: Abdomen is soft. There is no mass.     Tenderness: There is no abdominal tenderness.  Musculoskeletal:     Right lower leg: No edema.     Left lower leg: No edema.  Skin:    General: Skin is warm.  Neurological:     General: No focal deficit present.     Mental Status: She is alert and oriented to person, place, and time.     Cranial Nerves: Cranial nerves 2-12 are intact. No cranial nerve deficit.     Motor: No weakness.     Deep Tendon Reflexes: Reflexes normal.  Psychiatric:        Speech: Speech normal.        Behavior: Behavior normal.     Labs on Admission: I have personally reviewed following labs and imaging studies CBC: Recent Labs  Lab 09/05/23 0430  WBC 8.7  HGB 11.6*  HCT 35.3*  MCV 95.4  PLT 282   Basic Metabolic Panel: Recent Labs  Lab 09/05/23 0430  NA 139  K 4.0  CL 102  CO2 28  GLUCOSE 138*  BUN 21  CREATININE 0.68  CALCIUM  9.6   GFR: Estimated Creatinine Clearance: 51.1 mL/min (by C-G  formula based on SCr of 0.68 mg/dL). Liver Function Tests: Recent Labs  Lab 09/05/23 0430  AST 21  ALT 11  ALKPHOS 61  BILITOT 1.0  PROT 5.5*  ALBUMIN 3.2*   No results for input(s): LIPASE, AMYLASE in the last 168 hours. No results for input(s): AMMONIA in the last 168 hours. Coagulation Profile: No results for input(s): INR, PROTIME in the last 168 hours. Cardiac Enzymes: No results for input(s): CKTOTAL, CKMB, CKMBINDEX, TROPONINI in the last 168 hours. BNP (last 3 results) No results for input(s): PROBNP in the last 8760 hours. HbA1C: No results for input(s): HGBA1C in the last 72 hours. CBG: No results for input(s): GLUCAP in the last 168 hours. Lipid Profile: No results for input(s): CHOL, HDL, LDLCALC, TRIG, CHOLHDL, LDLDIRECT in the last 72 hours. Thyroid  Function Tests: No results for input(s): TSH, T4TOTAL, FREET4, T3FREE, THYROIDAB  in the last 72 hours. Anemia Panel: No results for input(s): VITAMINB12, FOLATE, FERRITIN, TIBC, IRON, RETICCTPCT in the last 72 hours. Urine analysis: No results found for: COLORURINE, APPEARANCEUR, LABSPEC, PHURINE, GLUCOSEU, HGBUR, BILIRUBINUR, KETONESUR, PROTEINUR, UROBILINOGEN, NITRITE, LEUKOCYTESUR Radiological Exams on Admission: ECHOCARDIOGRAM COMPLETE Result Date: 09/05/2023    ECHOCARDIOGRAM REPORT   Patient Name:   Shelby Lin Date of Exam: 09/05/2023 Medical Rec #:  [id number]      Height:       64.0 in Accession #:    [id number]     Weight:       154.3 lb Date of Birth:  10-18-1940      BSA:          1.752 m Patient Age:    83 years       BP:           104/50 mmHg Patient Gender: F              HR:           21 bpm. Exam Location:  Inpatient Procedure: 2D Echo, Cardiac Doppler and Color Doppler (Both Spectral and Color            Flow Doppler were utilized during procedure). Indications:    Elevated Troponin  History:        Patient has no prior  history of Echocardiogram examinations.                 Risk Factors:Hypertension.  Sonographer:    Philomena Daring Referring Phys: LOGAN N LOCKWOOD IMPRESSIONS  1. Distal septal/apical hypokinesis . Left ventricular ejection fraction, by estimation, is 45 to 50%. The left ventricle has mildly decreased function. The left ventricle demonstrates regional wall motion abnormalities (see scoring diagram/findings for  description). The left ventricular internal cavity size was mildly dilated. There is mild asymmetric left ventricular hypertrophy of the basal and septal segments. Left ventricular diastolic parameters are consistent with Grade I diastolic dysfunction (impaired relaxation).  2. Right ventricular systolic function is normal. The right ventricular size is normal.  3. The mitral valve is abnormal. No evidence of mitral valve regurgitation. No evidence of mitral stenosis.  4. The aortic valve is tricuspid. There is mild calcification of the aortic valve. Aortic valve regurgitation is not visualized. Aortic valve sclerosis is present, with no evidence of aortic valve stenosis.  5. The inferior vena cava is normal in size with greater than 50% respiratory variability, suggesting right atrial pressure of 3 mmHg. FINDINGS  Left Ventricle: Distal septal/apical hypokinesis. Left ventricular ejection fraction, by estimation, is 45 to 50%. The left ventricle has mildly decreased function. The left ventricle demonstrates regional wall motion abnormalities. Strain was performed  and the global longitudinal strain is indeterminate. The left ventricular internal cavity size was mildly dilated. There is mild asymmetric left ventricular hypertrophy of the basal and septal segments. Left ventricular diastolic parameters are consistent with Grade I diastolic dysfunction (impaired relaxation). Right Ventricle: The right ventricular size is normal. No increase in right ventricular wall thickness. Right ventricular systolic function  is normal. Left Atrium: Left atrial size was normal in size. Right Atrium: Right atrial size was normal in size. Pericardium: There is no evidence of pericardial effusion. Mitral Valve: The mitral valve is abnormal. There is mild thickening of the mitral valve leaflet(s). There is mild calcification of the mitral valve leaflet(s). Mild mitral annular calcification. No evidence of mitral valve regurgitation. No evidence of mitral valve stenosis. Tricuspid Valve: The  tricuspid valve is normal in structure. Tricuspid valve regurgitation is not demonstrated. No evidence of tricuspid stenosis. Aortic Valve: The aortic valve is tricuspid. There is mild calcification of the aortic valve. Aortic valve regurgitation is not visualized. Aortic valve sclerosis is present, with no evidence of aortic valve stenosis. Pulmonic Valve: The pulmonic valve was normal in structure. Pulmonic valve regurgitation is not visualized. No evidence of pulmonic stenosis. Aorta: The aortic root is normal in size and structure. Venous: The inferior vena cava is normal in size with greater than 50% respiratory variability, suggesting right atrial pressure of 3 mmHg. IAS/Shunts: The interatrial septum appears to be lipomatous. No atrial level shunt detected by color flow Doppler. Additional Comments: 3D was performed not requiring image post processing on an independent workstation and was indeterminate.  LEFT VENTRICLE PLAX 2D LVIDd:         3.50 cm   Diastology LVIDs:         2.60 cm   LV e' medial:    5.77 cm/s LV PW:         1.20 cm   LV E/e' medial:  10.9 LV IVS:        1.30 cm   LV e' lateral:   6.42 cm/s LVOT diam:     2.20 cm   LV E/e' lateral: 9.8 LV SV:         74 LV SV Index:   42 LVOT Area:     3.80 cm  RIGHT VENTRICLE             IVC RV S prime:     11.10 cm/s  IVC diam: 1.40 cm TAPSE (M-mode): 1.7 cm LEFT ATRIUM             Index        RIGHT ATRIUM           Index LA diam:        2.60 cm 1.48 cm/m   RA Area:     13.00 cm LA Vol  (A2C):   29.1 ml 16.61 ml/m  RA Volume:   32.20 ml  18.38 ml/m LA Vol (A4C):   18.1 ml 10.33 ml/m LA Biplane Vol: 25.3 ml 14.44 ml/m  AORTIC VALVE LVOT Vmax:   87.80 cm/s LVOT Vmean:  61.200 cm/s LVOT VTI:    0.195 m  AORTA Ao Root diam: 3.00 cm MITRAL VALVE MV Area (PHT): 4.21 cm     SHUNTS MV Decel Time: 180 msec     Systemic VTI:  0.20 m MV E velocity: 62.90 cm/s   Systemic Diam: 2.20 cm MV A velocity: 107.00 cm/s MV E/A ratio:  0.59 Maude Emmer MD Electronically signed by Maude Emmer MD Signature Date/Time: 09/05/2023/1:43:09 PM    Final    DG Chest Port 1 View Result Date: 09/05/2023 CLINICAL DATA:  [id number] Elevated troponin [id number] EXAM: PORTABLE CHEST 1 VIEW COMPARISON:  March 30, 2023, December 30, 2021 FINDINGS: The cardiomediastinal silhouette is unchanged in contour.Emphysematous changes. No pleural effusion. No pneumothorax. No acute pleuroparenchymal abnormality. IMPRESSION: No acute cardiopulmonary abnormality. Electronically Signed   By: Corean Salter M.D.   On: 09/05/2023 12:59   MR BRAIN WO CONTRAST Result Date: 09/05/2023 EXAM: MRI BRAIN WITHOUT CONTRAST 09/05/2023 09:25:46 AM TECHNIQUE: Multiplanar multisequence MRI of the head/brain was performed without the administration of intravenous contrast. COMPARISON: 05/20/2011 CLINICAL HISTORY: Neuro deficit, acute, stroke suspected. Sudden onset of dizziness. Personal history of meniere's disease. FINDINGS: BRAIN AND VENTRICLES: No acute infarct. No  intracranial hemorrhage. No mass. No midline shift. No hydrocephalus. The sella is unremarkable. Normal flow voids. Progressive moderate generalized atrophy and white matter disease is present bilaterally. White matter changes extended in the thalami and brainstem. ORBITS: No acute abnormality. SINUSES AND MASTOIDS: No acute abnormality. BONES AND SOFT TISSUES: Normal marrow signal. No acute soft tissue abnormality. IMPRESSION: 1. No acute intracranial abnormality. 2. Progressive moderate  generalized atrophy and white matter disease bilaterally, extending into the thalami and brainstem. This likely reflects the sequelae of chronic microvascular ischemia. Electronically signed by: Lonni Necessary MD 09/05/2023 10:22 AM EDT RP Workstation: [id number]   Data Reviewed: Relevant notes from primary care and specialist visits, past discharge summaries as available in EHR, including Care Everywhere . Prior diagnostic testing as pertinent to current admission diagnoses, Updated medications and problem lists for reconciliation .ED course, including vitals, labs, imaging, treatment and response to treatment,Triage notes, nursing and pharmacy notes and ED provider's notes.Notable results as noted in HPI.Discussed case with EDMD/ ED APP/ or Specialty MD on call and as needed.  Assessment & Plan  >>Dizziness/ presyncope: Patient's clinical presentation is suspicious for hypovolemia and dizziness secondary to meds or dehydration, or related to her NSTEMI, or VTE, or dysrhythmia. Patient had a negative MRI of the brain. Will obtain orthostatic vitals, maintain systolic goal of 869d. Follow-up patient's troponin D-dimer is pending. Aspiration and fall precaution.   >>Elevated troponin /CAD: EKG is nonischemic low voltage throughout.  Cardiology is following patient.  Will evaluate patient for VTE with a CTA chest PE protocol.    >>Hypothyroidism: Continue home regimen of levothyroxine  at current dose.   >> Diabetes mellitus type 2: Glycemic protocol and n.p.o. after midnight.  >> COPD: SpO2: 96 % Monitor patient's O2 sats and as needed albuterol  as needed.  >> GERD: IV PPI.    DVT prophylaxis:  Heparin  gtt per cards.  Consults:  Cardiology.   Advance Care Planning:    Code Status: Full Code   Family Communication:  At bedside.  Disposition Plan:  TBD.  Severity of Illness: The appropriate patient status for this patient is INPATIENT. Inpatient status is judged to be  reasonable and necessary in order to provide the required intensity of service to ensure the patient's safety. The patient's presenting symptoms, physical exam findings, and initial radiographic and laboratory data in the context of their chronic comorbidities is felt to place them at high risk for further clinical deterioration. Furthermore, it is not anticipated that the patient will be medically stable for discharge from the hospital within 2 midnights of admission.   * I certify that at the point of admission it is my clinical judgment that the patient will require inpatient hospital care spanning beyond 2 midnights from the point of admission due to high intensity of service, high risk for further deterioration and high frequency of surveillance required.*  Unresulted Labs (From admission, onward)     Start     Ordered   09/06/23 0500  Heparin  level (unfractionated)  Daily,   R     Placed in And Linked Group   09/05/23 1310   09/06/23 0500  CBC  Daily,   R     Placed in And Linked Group   09/05/23 1310   09/06/23 0500  Comprehensive metabolic panel  Tomorrow morning,   R        09/05/23 1401   09/05/23 2100  Heparin  level (unfractionated)  Once-Timed,   URGENT        09/05/23  1310   09/05/23 1401  Hemoglobin A1c  Add-on,   AD        09/05/23 1401   09/05/23 1305  D-dimer, quantitative  ONCE - STAT,   STAT        09/05/23 1304            Meds ordered this encounter  Medications   meclizine  (ANTIVERT ) tablet 50 mg   ondansetron  (ZOFRAN ) injection 4 mg   famotidine  (PEPCID ) IVPB 20 mg premix   LORazepam  (ATIVAN ) injection 1 mg   heparin  bolus via infusion 4,000 Units   heparin  ADULT infusion 100 units/mL (25000 units/250mL)   sodium chloride  flush (NS) 0.9 % injection 3 mL   sodium chloride  flush (NS) 0.9 % injection 3-10 mL   sodium chloride  flush (NS) 0.9 % injection 3-10 mL   sodium chloride  flush (NS) 0.9 % injection 3 mL   sodium chloride  flush (NS) 0.9 % injection  3 mL   0.9 %  sodium chloride  infusion     Orders Placed This Encounter  Procedures   MR BRAIN WO CONTRAST   DG Chest Port 1 View   CBC   Comprehensive metabolic panel with GFR   D-dimer, quantitative   Heparin  level (unfractionated)   Heparin  level (unfractionated)   CBC   Comprehensive metabolic panel   Hemoglobin A1c   Diet 2 gram sodium Room service appropriate? Yes; Fluid consistency: Thin   Maintain IV access   Vital signs   Notify physician (specify)   Mobility Protocol: No Restrictions RN to initiate protocols based on patient's level of care   Refer to Sidebar Report Refer to ICU, Med-Surg, Progressive, and Step-Down Mobility Protocol Sidebars   Initiate Adult Central Line Maintenance and Catheter Protocol for patients with central line (CVC, PICC, Port, Hemodialysis, Trialysis)   Daily weights   Intake and Output   Do not place and if present remove PureWick   Initiate Oral Care Protocol   Initiate Carrier Fluid Protocol   RN may order General Admission PRN Orders utilizing General Admission PRN medications (through manage orders) for the following patient needs: allergy symptoms (Claritin), cold sores (Carmex), cough (Robitussin DM), eye irritation (Liquifilm Tears), hemorrhoids (Tucks), indigestion (Maalox), minor skin irritation (Hydrocortisone Cream), muscle pain Lucienne Gay), nose irritation (saline nasal spray) and sore throat (Chloraseptic spray).   Cardiac Monitoring - Continuous Indefinite   Full code   Inpatient consult to Cardiology   Consult to hospitalist   heparin  per pharmacy consult   Pulse oximetry check with vital signs   Oxygen  therapy Mode or (Route): Nasal cannula; Liters Per Minute: 2; Keep O2 saturation between: greater than 92 %   I-Stat CG4 Lactic Acid   EKG 12-Lead   ECHOCARDIOGRAM COMPLETE   Insert peripheral IV   Admit to Inpatient (patient's expected length of stay will be greater than 2 midnights or inpatient only procedure)   Aspiration  precautions   Fall precautions    Author: Mario LULLA Blanch, MD 12 pm- 8 pm. [facility]. 09/05/2023 2:03 PM Please note for any communication after hours contact [facility] Assigned provider on call on Amion.

## 2023-09-05 NOTE — ED Notes (Signed)
 Report given to [facility]

## 2023-09-05 NOTE — Consult Note (Signed)
 Cardiology Consultation   [name] MRN: [id number]; DOB: [birth year]  Admit date: 09/05/2023 Date of Consult: 09/05/2023  PCP:  Shelby Pellet, MD   [facility] Providers Cardiologist:  Oneil Parchment, MD        Patient Profile: Shelby Lin is a 83 y.o. female with a hx of meniere's disease, COPD, GERD, coronary artery calcification, hypertension, hyperlipidemia, fibromyalgia, h/o breast cancer and constipation who is being seen 09/05/2023 for the evaluation of syncope at the request of Shelby Sakai MD.  History of Present Illness: Shelby Lin follows with [facility] and was last seen by Dr. Parchment on 12/02/22 where she reported constipation and GERD issues. Mild peripheral edema treated with prn lasix.   She established care with Dr. Parchment in 2014 due to anginal symptoms needing pre-operative clearance. CT scan in 2019 showed coronary artery calcifications. She received a LHC which showed no significant CAD. She then had a nuclear stress test in 05/2020 which showed no evidence of ischemia with normal LVEF.   CT high resolution 2/25:   Presented to the [facility] ED on 7/12 for dizziness/emesis brought on by head movement. In the ED BP 110/70-> 95/48.  Troponin 508-> 542 ECG: Sinus with PVC, no ischemia changes MRI Brain Wo: no acute intracranial abnormality with progressive moderate atrophy 2/2 chronic microvascular ischemia She has received ativan , Antivert , and zofran .   On interview patient is sleepy from ativan  administration. She shares she woke up in the middle of the night to urinate and noted she was extremely dizzy. She had to crawl to the bathroom. She was also diaphoretic and nausea with emesis.No episode of syncope.  She had been lightheaded and dizzy the few days leading up but she attributed them to the heat.  She had one episode of chest pain during dinner yesterday, per her and her daughter it was her typical GERD episode.  Denied shortness of  breath and palpitations.   Past Medical History:  Diagnosis Date   Anxiety    Arthritis    Asthma in child    Atherosclerotic heart disease of native coronary artery without angina pectoris    Cataracts, bilateral    Combined hyperlipidemia    Constipation, unspecified    COPD (chronic obstructive pulmonary disease) (HCC)    Coronary artery spasm (HCC)    Diabetic neuropathy (HCC)    DM (diabetes mellitus) with complications (HCC)    Dupuytren's contracture    Dyspepsia    Familial tremor    Fatigue    Fibromyalgia syndrome    GERD (gastroesophageal reflux disease)    Hashimoto's thyroiditis    Hiatal hernia    History of colon polyps    Hx of Hashimoto thyroiditis    Hypercholesteremia    Hypertension    Hypothyroidism, acquired, autoimmune    IBS (irritable bowel syndrome)    Meniere's disease of left ear    Migraine without aura and without status migrainosus, not intractable    Multinodular goiter (nontoxic)    Nonexudative macular degeneration    Raynaud's syndrome without gangrene    Situational stress    Tachycardia    Thyroiditis, autoimmune    Ulcerative colitis, unspecified, without complications (HCC)    Vitamin B 12 deficiency    Vitamin D  deficiency    Wears glasses     Past Surgical History:  Procedure Laterality Date   ANAL RECTAL MANOMETRY Bilateral 11/26/2022   Procedure: ANO RECTAL MANOMETRY;  Surgeon: Burnette Fallow, MD;  Location:  [facility];  Service: Gastroenterology;  Laterality: Bilateral;   BREAST EXCISIONAL BIOPSY Left    benign   BREAST EXCISIONAL BIOPSY Right    milk gland removed   BREAST LUMPECTOMY Left 1990   benign   BREAST LUMPECTOMY WITH NEEDLE LOCALIZATION Right 02/28/2013   Procedure: RIGHT BREAST NEEDLE LOCALIZATION LUMPECTOMY ;  Surgeon: Shelby ONEIDA Olives, MD;  Location: [facility];  Service: General;  Laterality: Right;   CHOLECYSTECTOMY  2008   lapcholi   DILATION AND CURETTAGE OF UTERUS     LEFT  HEART CATHETERIZATION WITH CORONARY ANGIOGRAM N/A 01/26/2013   Procedure: LEFT HEART CATHETERIZATION WITH CORONARY ANGIOGRAM;  Surgeon: Oneil Parchment, MD;  Location: [facility];  Service: Cardiovascular;  Laterality: N/A;   PARTIAL HYSTERECTOMY  1971   TONSILLECTOMY         Scheduled Meds:  Continuous Infusions:  PRN Meds:   Allergies:    Allergies  Allergen Reactions   Acyclovir And Related Other (See Comments)    Unknown    Allegra [Fexofenadine] Hives   Amoxicillin Hives   Codeine Nausea And Vomiting   Elemental Sulfur Hives   Enalapril Maleate Cough   Hydrocodone  Bit-Homatrop Mbr Other (See Comments)    Unknown    Hydrocodone -Acetaminophen  Other (See Comments)    Unknown    Keflex [Cephalexin] Hives   Levofloxacin Other (See Comments)    insomnia   Lorabid [Loracarbef] Hives   Misc. Sulfonamide Containing Compounds Hives   Sulfa Antibiotics Hives   Sulfur Hives    Social History:   Social History   Socioeconomic History   Marital status: Married    Spouse name: edward   Number of children: 2   Years of education: 12th   Highest education level: Not on file  Occupational History    Employer: [employer]  Tobacco Use   Smoking status: Former    Current packs/day: 0.00    Average packs/day: 0.5 packs/day for 42.0 years (21.0 ttl pk-yrs)    Types: Cigarettes    Start date: 02/22/1959    Quit date: 02/21/2001    Years since quitting: 22.5   Smokeless tobacco: Never  Vaping Use   Vaping status: Never Used  Substance and Sexual Activity   Alcohol use: No   Drug use: No   Sexual activity: Not on file  Other Topics Concern   Not on file  Social History Narrative   Patient lives at home with the husband.. Patient drinks coffee   Social Drivers of Corporate investment banker Strain: Not on file  Food Insecurity: Low Risk  (07/23/2022)   Received from [facility]   Hunger Vital Sign    Within the past 12 months, you worried that your food  would run out before you got money to buy more: Never true    Within the past 12 months, the food you bought just didn't last and you didn't have money to get more. : Never true  Transportation Needs: Not on file (07/23/2022)  Physical Activity: Not on file  Stress: Not on file  Social Connections: Not on file  Intimate Partner Violence: Not on file    Family History:    Family History  Problem Relation Age of Onset   Breast cancer Mother 51   Hypertension Mother    Stroke Mother    Heart attack Father    Sudden death Father    Hypertension Sister    Diabetes Sister    Stroke Sister  Breast cancer Paternal Aunt 57 - 69   Ovarian cancer Paternal Aunt 37 - 69   Thyroid  disease Neg Hx      ROS:  Please see the history of present illness.   All other ROS reviewed and negative.     Physical Exam/Data: Vitals:   09/05/23 0645 09/05/23 0830 09/05/23 1100 09/05/23 1206  BP: 110/63 (!) 95/48 119/68 (!) 100/59  Pulse: 93 90  83  Resp: (!) 26 20 18 16   Temp:    97.7 F (36.5 C)  TempSrc:    Oral  SpO2: 96% 98%  96%  Weight:      Height:        Intake/Output Summary (Last 24 hours) at 09/05/2023 1231 Last data filed at 09/05/2023 0630 Gross per 24 hour  Intake 50 ml  Output --  Net 50 ml      09/05/2023    2:33 AM 04/22/2023   10:33 AM 04/17/2023   11:03 AM  Last 3 Weights  Weight (lbs) 154 lb 5.2 oz 154 lb 6.4 oz 151 lb 3.2 oz  Weight (kg) 70 kg 70.035 kg 68.584 kg     Body mass index is 26.49 kg/m.  General:  Well nourished, well developed, in no acute distress HEENT: normal Vascular: Radial pulses 2+ bilaterally Cardiac:  normal S1, S2; RRR; no murmur  Lungs:  Diminished breath sounds on the L, no rhonchi, rales, or wheezing Abd: soft, nontender, nondistended  Ext: +1 pitting edema Musculoskeletal:  No deformities, Skin: warm and dry  Neuro:  CNs 2-12 intact, no focal abnormalities noted Psych:  Normal affect.   EKG:  The EKG was personally reviewed and  demonstrates:  See HPI. Telemetry:  Telemetry was personally reviewed and demonstrates:  Sinus with frequent PVCs. HR 88-93  Relevant CV Studies: NM Stress Test 06/02/20 The left ventricular ejection fraction is hyperdynamic (>65%). Nuclear stress EF: 80%. There was no ST segment deviation noted during stress. No T wave inversion was noted during stress. The study is normal. This is a low risk study.   RECOMMENDATIONS/CONCLUSIONS Nuclear medicine stress test negative for ischemia/infarction. LV-EDV volume low normal. The study is consistent with a low risk study.  LHC 01/26/13 HEMODYNAMICS:  Aortic pressure was 108/24mmHg; LV systolic pressure was ; LVEDP .  There was no gradient between the left ventricle and aorta.     ANGIOGRAPHIC DATA:     Left main: No angiographically significant coronary artery disease.   Left anterior descending (LAD): 2 diagonal vessels. Wraps around apex. No angiographically significant coronary artery disease.   Circumflex artery (CIRC): Dominant vessel giving rise to the posterior descending artery. No angiographically significant coronary artery disease   Right coronary artery (RCA): Small vessel, nondominant. No angiographically significant coronary artery disease   LEFT VENTRICULOGRAM:  Left ventricular angiogram was done in the 30 RAO projection and revealed normal left ventricular wall motion and systolic function with an estimated ejection fraction of 65 %.    IMPRESSIONS:   No angiographically significant CAD Normal left ventricular systolic function.  LVEDP 14 mmHg.  Ejection fraction 65 %  Laboratory Data: High Sensitivity Troponin:   Recent Labs  Lab 09/05/23 0547 09/05/23 0722  TROPONINIHS 508* 542*     Chemistry Recent Labs  Lab 09/05/23 0430  NA 139  K 4.0  CL 102  CO2 28  GLUCOSE 138*  BUN 21  CREATININE 0.68  CALCIUM  9.6  GFRNONAA >60  ANIONGAP 9    Recent Labs  Lab  09/05/23 0430  PROT 5.5*  ALBUMIN  3.2*  AST 21  ALT 11  ALKPHOS 61  BILITOT 1.0   Lipids No results for input(s): CHOL, TRIG, HDL, LABVLDL, LDLCALC, CHOLHDL in the last 168 hours.  Hematology Recent Labs  Lab 09/05/23 0430  WBC 8.7  RBC 3.70*  HGB 11.6*  HCT 35.3*  MCV 95.4  MCH 31.4  MCHC 32.9  RDW 12.4  PLT 282   Thyroid  No results for input(s): TSH, FREET4 in the last 168 hours.  BNPNo results for input(s): BNP, PROBNP in the last 168 hours.  DDimer No results for input(s): DDIMER in the last 168 hours.  Radiology/Studies:  MR BRAIN WO CONTRAST Result Date: 09/05/2023 EXAM: MRI BRAIN WITHOUT CONTRAST 09/05/2023 09:25:46 AM TECHNIQUE: Multiplanar multisequence MRI of the head/brain was performed without the administration of intravenous contrast. COMPARISON: 05/20/2011 CLINICAL HISTORY: Neuro deficit, acute, stroke suspected. Sudden onset of dizziness. Personal history of meniere's disease. FINDINGS: BRAIN AND VENTRICLES: No acute infarct. No intracranial hemorrhage. No mass. No midline shift. No hydrocephalus. The sella is unremarkable. Normal flow voids. Progressive moderate generalized atrophy and white matter disease is present bilaterally. White matter changes extended in the thalami and brainstem. ORBITS: No acute abnormality. SINUSES AND MASTOIDS: No acute abnormality. BONES AND SOFT TISSUES: Normal marrow signal. No acute soft tissue abnormality. IMPRESSION: 1. No acute intracranial abnormality. 2. Progressive moderate generalized atrophy and white matter disease bilaterally, extending into the thalami and brainstem. This likely reflects the sequelae of chronic microvascular ischemia. Electronically signed by: Shelby Necessary MD 09/05/2023 10:22 AM EDT RP Workstation: [id number]     Assessment and Plan: Elevated Troponin Patient presents with severe dizziness, nausea with emesis, and diaphoresis that awoke her at night. Denies shortness of breath or chest pain. She does have a  history of meniere's disease which she is being treated for in the ED.   ECG shows no acute ischemic changes and Hs troponin 508 -> 542  She has a LHC in 2014 showing normal coronaries, and her last stress test in 2022 showed no evidence of ischemia. A CT High resolution 03/2023 showed coronary calcifications.  At this time her symptoms are atypical for ACS, but given her troponin levels and intermediate risk will start heparin  for now and continue to trend troponin. Will obtain an echocardiogram to evaluate for any wall motion abnormalities. If echocardiogram is abnormal/troponins continue to rise will need to pursue a more invasive ischemic evaluation. If her echocardiogram is mostly unremarkable/troponins remain stable will pursue a CCTA (inpatient vs outpatient).   Would continue PTA ASA 81 mg Start heparin  gtt  Hypotension H/o Hypertension Patient noted to be hypotensive in the ED (95/48). She received a dose of ativan  prior to this value, however has had some lower blood pressure overall. Would continue to hold PTA antihypertensives at this time.   Peripheral edema Patient does have mild pitting edema which was also noted at her last cardiology visit. Will hold off on diuresis given hypotension in the ED. BP: 100/59. CXR pending. Echocardiogram pending.   Hyperlipidemia  -resume crestor  20 mg  Per primary Vertigo COPD  Hypothyroidism Fibromyalgia  Risk Assessment/Risk Scores:    TIMI Risk Score for Unstable Angina or Non-ST Elevation MI:   The patient's TIMI risk score is 4, which indicates a 20% risk of all cause mortality, new or recurrent myocardial infarction or need for urgent revascularization in the next 14 days.         For questions or updates,  please contact [facility] Please consult [email or web address] for contact info under    Signed, Leontine LOISE Salen, PA-C  09/05/2023 12:31 PM

## 2023-09-05 NOTE — Progress Notes (Signed)
 PHARMACY - ANTICOAGULATION CONSULT NOTE  Pharmacy Consult for heparin  Indication: chest pain/ACS  Allergies  Allergen Reactions   Acyclovir And Related Other (See Comments)    Unknown    Allegra [Fexofenadine] Hives   Amoxicillin Hives   Codeine Nausea And Vomiting   Elemental Sulfur Hives   Enalapril Maleate Cough   Hydrocodone  Bit-Homatrop Mbr Other (See Comments)    Unknown    Hydrocodone -Acetaminophen  Other (See Comments)    Unknown    Keflex [Cephalexin] Hives   Levofloxacin Other (See Comments)    insomnia   Lorabid [Loracarbef] Hives   Misc. Sulfonamide Containing Compounds Hives   Sulfa Antibiotics Hives   Sulfur Hives    Patient Measurements: Height: 5' 4 (162.6 cm) Weight: 70 kg (154 lb 5.2 oz) IBW/kg (Calculated) : 54.7 HEPARIN  DW (KG): 68.9  Vital Signs: Temp: 97.7 F (36.5 C) (07/12 1206) Temp Source: Oral (07/12 1206) BP: 100/59 (07/12 1206) Pulse Rate: 83 (07/12 1206)  Labs: Recent Labs    09/05/23 0430 09/05/23 0547 09/05/23 0722  HGB 11.6*  --   --   HCT 35.3*  --   --   PLT 282  --   --   CREATININE 0.68  --   --   TROPONINIHS  --  508* 542*    Estimated Creatinine Clearance: 51.1 mL/min (by C-G formula based on SCr of 0.68 mg/dL).   Medical History: Past Medical History:  Diagnosis Date   Anxiety    Arthritis    Asthma in child    Atherosclerotic heart disease of native coronary artery without angina pectoris    Cataracts, bilateral    Combined hyperlipidemia    Constipation, unspecified    COPD (chronic obstructive pulmonary disease) (HCC)    Coronary artery spasm (HCC)    Diabetic neuropathy (HCC)    DM (diabetes mellitus) with complications (HCC)    Dupuytren's contracture    Dyspepsia    Familial tremor    Fatigue    Fibromyalgia syndrome    GERD (gastroesophageal reflux disease)    Hashimoto's thyroiditis    Hiatal hernia    History of colon polyps    Hx of Hashimoto thyroiditis    Hypercholesteremia     Hypertension    Hypothyroidism, acquired, autoimmune    IBS (irritable bowel syndrome)    Meniere's disease of left ear    Migraine without aura and without status migrainosus, not intractable    Multinodular goiter (nontoxic)    Nonexudative macular degeneration    Raynaud's syndrome without gangrene    Situational stress    Tachycardia    Thyroiditis, autoimmune    Ulcerative colitis, unspecified, without complications (HCC)    Vitamin B 12 deficiency    Vitamin D  deficiency    Wears glasses     Assessment: 89 YOF presenting with syncope, elevated troponin, she is not on anticoagulation PTA  Goal of Therapy:  Heparin  level 0.3-0.7 units/ml Monitor platelets by anticoagulation protocol: Yes   Plan:  Heparin  4000 units IV x 1, and gtt at 900 units/hr F/u 8 hour heparin  level F/u cards eval and recs  Dorn Poot, PharmD, [facility] Clinical Pharmacist ED Pharmacist Phone # [phone] 09/05/2023 1:06 PM

## 2023-09-05 NOTE — ED Triage Notes (Signed)
 Pt arrived from home via POV c/o dizziness and nausea. LKW 2200 09/04/2023 [facility] 0. Zofran  4mg  adm by ems at 0147.

## 2023-09-05 NOTE — ED Notes (Signed)
 Pt taken to CT.

## 2023-09-06 DIAGNOSIS — R42 Dizziness and giddiness: Secondary | ICD-10-CM | POA: Diagnosis not present

## 2023-09-06 LAB — CBC
HCT: 36.4 % (ref 36.0–46.0)
Hemoglobin: 11.7 g/dL — ABNORMAL LOW (ref 12.0–15.0)
MCH: 30.8 pg (ref 26.0–34.0)
MCHC: 32.1 g/dL (ref 30.0–36.0)
MCV: 95.8 fL (ref 80.0–100.0)
Platelets: 259 K/uL (ref 150–400)
RBC: 3.8 MIL/uL — ABNORMAL LOW (ref 3.87–5.11)
RDW: 12.4 % (ref 11.5–15.5)
WBC: 8.8 K/uL (ref 4.0–10.5)
nRBC: 0 % (ref 0.0–0.2)

## 2023-09-06 LAB — COMPREHENSIVE METABOLIC PANEL WITH GFR
ALT: 9 U/L (ref 0–44)
AST: 17 U/L (ref 15–41)
Albumin: 3 g/dL — ABNORMAL LOW (ref 3.5–5.0)
Alkaline Phosphatase: 63 U/L (ref 38–126)
Anion gap: 7 (ref 5–15)
BUN: 17 mg/dL (ref 8–23)
CO2: 28 mmol/L (ref 22–32)
Calcium: 9 mg/dL (ref 8.9–10.3)
Chloride: 104 mmol/L (ref 98–111)
Creatinine, Ser: 0.79 mg/dL (ref 0.44–1.00)
GFR, Estimated: >60 mL/min (ref >60)
Glucose, Bld: 125 mg/dL — ABNORMAL HIGH (ref 70–99)
Potassium: 3.7 mmol/L (ref 3.5–5.1)
Sodium: 139 mmol/L (ref 135–145)
Total Bilirubin: 0.8 mg/dL (ref 0.0–1.2)
Total Protein: 5.3 g/dL — ABNORMAL LOW (ref 6.5–8.1)

## 2023-09-06 LAB — HEPARIN LEVEL (UNFRACTIONATED)
Heparin Unfractionated: 0.57 [IU]/mL (ref 0.30–0.70)
Heparin Unfractionated: <0.1 [IU]/mL — ABNORMAL LOW (ref 0.30–0.70)
Heparin Unfractionated: 0.22 [IU]/mL — ABNORMAL LOW (ref 0.30–0.70)
Heparin Unfractionated: 0.33 [IU]/mL (ref 0.30–0.70)

## 2023-09-06 LAB — MAGNESIUM: Magnesium: 1.8 mg/dL (ref 1.7–2.4)

## 2023-09-06 MED ORDER — BUDESON-GLYCOPYRROL-FORMOTEROL 160-9-4.8 MCG/ACT IN AERO
2.0000 | INHALATION_SPRAY | Freq: Two times a day (BID) | RESPIRATORY_TRACT | Status: DC
  Administered 2023-09-06 – 2023-09-08 (×4): 2 via RESPIRATORY_TRACT
  Filled 2023-09-06: qty 5.9

## 2023-09-06 MED ORDER — ROSUVASTATIN CALCIUM 20 MG PO TABS
20.0000 mg | ORAL_TABLET | Freq: Every day | ORAL | Status: DC
  Administered 2023-09-06 – 2023-09-08 (×3): 20 mg via ORAL
  Filled 2023-09-06 (×3): qty 1

## 2023-09-06 MED ORDER — DULOXETINE HCL 30 MG PO CPEP
30.0000 mg | ORAL_CAPSULE | Freq: Two times a day (BID) | ORAL | Status: DC
  Administered 2023-09-06 – 2023-09-08 (×5): 30 mg via ORAL
  Filled 2023-09-06 (×5): qty 1

## 2023-09-06 MED ORDER — LEVOTHYROXINE SODIUM 88 MCG PO TABS
88.0000 ug | ORAL_TABLET | Freq: Every day | ORAL | Status: DC
  Administered 2023-09-07 – 2023-09-08 (×2): 88 ug via ORAL
  Filled 2023-09-06 (×2): qty 1

## 2023-09-06 MED ORDER — MECLIZINE HCL 25 MG PO TABS
25.0000 mg | ORAL_TABLET | Freq: Three times a day (TID) | ORAL | Status: DC | PRN
  Administered 2023-09-06: 25 mg via ORAL
  Filled 2023-09-06 (×2): qty 1

## 2023-09-06 MED ORDER — ASPIRIN 81 MG PO TBEC
81.0000 mg | DELAYED_RELEASE_TABLET | Freq: Every day | ORAL | Status: DC
  Administered 2023-09-06 – 2023-09-08 (×2): 81 mg via ORAL
  Filled 2023-09-06 (×3): qty 1

## 2023-09-06 MED ORDER — ASPIRIN 81 MG PO CHEW
81.0000 mg | CHEWABLE_TABLET | Freq: Once | ORAL | Status: AC
  Administered 2023-09-07: 81 mg via ORAL
  Filled 2023-09-06: qty 1

## 2023-09-06 MED ORDER — ALBUTEROL SULFATE (2.5 MG/3ML) 0.083% IN NEBU: 3.0000 mL | INHALATION_SOLUTION | Freq: Four times a day (QID) | RESPIRATORY_TRACT | Status: DC | PRN

## 2023-09-06 MED ORDER — LACTATED RINGERS IV BOLUS
500.0000 mL | Freq: Once | INTRAVENOUS | Status: AC
  Administered 2023-09-06: 500 mL via INTRAVENOUS

## 2023-09-06 NOTE — Progress Notes (Addendum)
 Pt BP 93/60. Other VS stable, pt asymptomatic.  Howerter, MD notified. LR 500cc bolus ordered.

## 2023-09-06 NOTE — Progress Notes (Signed)
 PHARMACY - ANTICOAGULATION CONSULT NOTE  Pharmacy Consult for IV heparin  Indication: chest pain/ACS  Allergies  Allergen Reactions   Acyclovir And Related Other (See Comments)    Unknown    Allegra [Fexofenadine] Hives   Amoxicillin Hives   Codeine Nausea And Vomiting   Elemental Sulfur Hives   Enalapril Maleate Cough   Hydrocodone  Bit-Homatrop Mbr Other (See Comments)    Unknown    Hydrocodone -Acetaminophen  Other (See Comments)    Unknown    Keflex [Cephalexin] Hives   Levofloxacin Other (See Comments)    insomnia   Lorabid [Loracarbef] Hives   Misc. Sulfonamide Containing Compounds Hives   Sulfa Antibiotics Hives   Sulfur Hives    Patient Measurements: Height: 5' 4 (162.6 cm) Weight: 68.7 kg (151 lb 7.3 oz) IBW/kg (Calculated) : 54.7 HEPARIN  DW (KG): 66.7  Vital Signs: Temp: 98.2 F (36.8 C) (07/13 1548) Temp Source: Oral (07/13 1548) BP: 109/58 (07/13 1548) Pulse Rate: 88 (07/13 1548)  Labs: Recent Labs    09/05/23 0430 09/05/23 0547 09/05/23 0722 09/05/23 1209 09/05/23 1415 09/05/23 2057 09/06/23 0246 09/06/23 1045 09/06/23 1904  HGB 11.6*  --   --   --   --   --  11.7*  --   --   HCT 35.3*  --   --   --   --   --  36.4  --   --   PLT 282  --   --   --   --   --  259  --   --   HEPARINUNFRC  --   --   --   --   --    < > 0.57 <0.10* 0.22*  CREATININE 0.68  --   --   --   --   --  0.79  --   --   TROPONINIHS  --    < > 542* 441* 409*  --   --   --   --    < > = values in this interval not displayed.    Estimated Creatinine Clearance: 50.7 mL/min (by C-G formula based on SCr of 0.79 mg/dL).   Medical History: Past Medical History:  Diagnosis Date   Anxiety    Arthritis    Asthma in child    Atherosclerotic heart disease of native coronary artery without angina pectoris    Cataracts, bilateral    Combined hyperlipidemia    Constipation, unspecified    COPD (chronic obstructive pulmonary disease) (HCC)    Coronary artery spasm (HCC)     Diabetic neuropathy (HCC)    DM (diabetes mellitus) with complications (HCC)    Dupuytren's contracture    Dyspepsia    Familial tremor    Fatigue    Fibromyalgia syndrome    GERD (gastroesophageal reflux disease)    Hashimoto's thyroiditis    Hiatal hernia    History of colon polyps    Hx of Hashimoto thyroiditis    Hypercholesteremia    Hypertension    Hypothyroidism, acquired, autoimmune    IBS (irritable bowel syndrome)    Meniere's disease of left ear    Migraine without aura and without status migrainosus, not intractable    Multinodular goiter (nontoxic)    Nonexudative macular degeneration    Raynaud's syndrome without gangrene    Situational stress    Tachycardia    Thyroiditis, autoimmune    Ulcerative colitis, unspecified, without complications (HCC)    Vitamin B 12 deficiency    Vitamin D  deficiency  Wears glasses     Medications:  Scheduled:   [START ON 09/07/2023] aspirin   81 mg Oral Once   aspirin  EC  81 mg Oral Daily   budesonide -glycopyrrolate -formoterol   2 puff Inhalation BID   DULoxetine   30 mg Oral BID   [START ON 09/07/2023] levothyroxine   88 mcg Oral QAC breakfast   rosuvastatin   20 mg Oral Daily   sodium chloride  flush  3 mL Intravenous Q12H   sodium chloride  flush  3 mL Intravenous Q12H   sodium chloride  flush  3-10 mL Intravenous Q12H    Assessment: Pt is 83 yo F presenting with syncope and elevated troponin. Pharmacy consulted for anticoagulation. PTA meds reviewed; no AC PTA. Started on heparin  for chest pain/ACS.   PIV access lost 7/13 AM at an unknown time. Heparin  level 7/13 at 1045 was undetectable and heparin  infusion was restarted at 800 units/h at 1128.   Heparin  level increasing but remains subtherapeutic at 0.22. HL drawn a bit early. Due to previous therapeutic level on 800 units/h and interruption to infusion, will not rebolus or change the rate at this time. HL level ordered for 2100 to determine plan.   Goal of Therapy:   Heparin  level 0.3-0.7 units/ml Monitor platelets by anticoagulation protocol: Yes   Plan:  Continue heparin  at 800 units/h.  Check anti-Xa level in 8 hours and daily while on heparin  Continue to monitor H&H and platelets  Jahrel Borthwick, PharmD PGY1 Clinical Pharmacist [facility]  09/06/2023 8:03 PM

## 2023-09-06 NOTE — Progress Notes (Signed)
 [facility] PROGRESS NOTE  Shelby Lin (DOB: March 29, 1940) FMW:[id number] PCP: Shelby Pellet, MD  Brief Narrative: Shelby Lin is a 83 y.o. female with a history of multiple medication allergies, COPD, CAD, HTN, HLD, T2DM, diabetic neuropathy, fibromyalgia, GERD hypothyroidism, Meniere's disease who presented to the ED on 09/05/2023 after having worse than usual vertigo requiring her to crawl at home. Work up included brain MRI which was negative and notably elevated troponin.   Subjective: Daughter at bedside, pt anxious about procedure and lab results, no chest pain, having bleeding from left arm IV site which has been discontinued. Had low BP overnight, got small bolus, lactic acid wnl.   Objective: BP (!) 85/55   Pulse 85   Temp (!) 96.9 F (36.1 C) (Oral)   Resp 16 Comment: Simultaneous filing. User may not have seen previous data.  Ht 5' 4 (1.626 m)   Wt 68.7 kg   SpO2 93%   BMI 26.00 kg/m   Gen: Elderly female in no distress Pulm: Clear, nonlabored  CV: RRR GI: Soft, NT, ND, +BS  Neuro: Alert and oriented. No new focal deficits. Ext: Warm, no deformities Skin: Left AC IV site hemostatic but evidence of recent bleeding. No other new rashes, lesions or ulcers on visualized skin   Assessment & Plan: Dizziness/ presyncope: Suspect chronic vertigo based on symptom report. No central lesion on brain MRI. Chronic meniere's contributing. ?if a primary cardiac event has contributed.  - Continue meclizine  prn.  - Possibly precipitated by hypovolemia but will not continue IVF at this time.    NSTEMI: Newly depressed LVEF, troponin up to 542 but trending downward. Coronary calcifications noted on imaging. Also reported hx coronary vasospasm.  - Catheterization planned 7/14 per cardiology, continue heparin  gtt and ASA 81mg  and high intensity statin.   Fibromyalgia:  - Restart home cymbalta .     Hypothyroidism: Last TSH was wnl.  - Continue home synthroid , next dose tmrw  AM  T2DM: Last HbA1c 6.3%.  - Continue SSI.    COPD: Stable emphysematous changes on CT chest without acute findings.  - No wheezing, continue breztri  formulary equivalent and albuterol  prn.    GERD:  - Continue PPI  Shelby KATHEE Come, MD [facility] [email or web address] 09/06/2023, 11:38 AM

## 2023-09-06 NOTE — Progress Notes (Addendum)
 PHARMACY - ANTICOAGULATION CONSULT NOTE  Pharmacy Consult for heparin  Indication: chest pain/ACS  Allergies  Allergen Reactions   Acyclovir And Related Other (See Comments)    Unknown    Allegra [Fexofenadine] Hives   Amoxicillin Hives   Codeine Nausea And Vomiting   Elemental Sulfur Hives   Enalapril Maleate Cough   Hydrocodone  Bit-Homatrop Mbr Other (See Comments)    Unknown    Hydrocodone -Acetaminophen  Other (See Comments)    Unknown    Keflex [Cephalexin] Hives   Levofloxacin Other (See Comments)    insomnia   Lorabid [Loracarbef] Hives   Misc. Sulfonamide Containing Compounds Hives   Sulfa Antibiotics Hives   Sulfur Hives    Patient Measurements: Height: 5' 4 (162.6 cm) Weight: 68.7 kg (151 lb 7.3 oz) IBW/kg (Calculated) : 54.7 HEPARIN  DW (KG): 66.7  Vital Signs: Temp: 97.6 F (36.4 C) (07/13 0724) Temp Source: Oral (07/13 0724) BP: 111/60 (07/13 0724) Pulse Rate: 64 (07/13 0724)  Labs: Recent Labs    09/05/23 0430 09/05/23 0547 09/05/23 0722 09/05/23 1209 09/05/23 1415 09/05/23 2057 09/06/23 0246  HGB 11.6*  --   --   --   --   --  11.7*  HCT 35.3*  --   --   --   --   --  36.4  PLT 282  --   --   --   --   --  259  HEPARINUNFRC  --   --   --   --   --  0.73* 0.57  CREATININE 0.68  --   --   --   --   --  0.79  TROPONINIHS  --    < > 542* 441* 409*  --   --    < > = values in this interval not displayed.    Estimated Creatinine Clearance: 50.7 mL/min (by C-G formula based on SCr of 0.79 mg/dL).   Medical History: Past Medical History:  Diagnosis Date   Anxiety    Arthritis    Asthma in child    Atherosclerotic heart disease of native coronary artery without angina pectoris    Cataracts, bilateral    Combined hyperlipidemia    Constipation, unspecified    COPD (chronic obstructive pulmonary disease) (HCC)    Coronary artery spasm (HCC)    Diabetic neuropathy (HCC)    DM (diabetes mellitus) with complications (HCC)    Dupuytren's  contracture    Dyspepsia    Familial tremor    Fatigue    Fibromyalgia syndrome    GERD (gastroesophageal reflux disease)    Hashimoto's thyroiditis    Hiatal hernia    History of colon polyps    Hx of Hashimoto thyroiditis    Hypercholesteremia    Hypertension    Hypothyroidism, acquired, autoimmune    IBS (irritable bowel syndrome)    Meniere's disease of left ear    Migraine without aura and without status migrainosus, not intractable    Multinodular goiter (nontoxic)    Nonexudative macular degeneration    Raynaud's syndrome without gangrene    Situational stress    Tachycardia    Thyroiditis, autoimmune    Ulcerative colitis, unspecified, without complications (HCC)    Vitamin B 12 deficiency    Vitamin D  deficiency    Wears glasses     Assessment: Shelby Lin presenting with syncope, elevated troponin, she is not on anticoagulation PTA.  7/13 AM: HL therapeutic at 0.57, Hgb stable, Plt stable. No s/sx of bleeding. 7/13 1145  AM: HL undetectable. Pt does not have IV line, getting replaced. Will retime heparin  to start once new line is placed.  PIV access lost at unknown time this morning. Was informed that line would be replaced. Unknown time when line was replaced.  Heparin  level at 1045 was undetectable. Heparin  infusion was restarted at previous rate 1128.  Due to unknown length of gap in heparin  infusion, will not re-bolus at this time, as the infusion has been running since at least 1128. Will keep heparin  infusion rate the same due to being therapeutic prior to losing IV access.   Goal of Therapy:  Heparin  level 0.3-0.7 units/ml Monitor platelets by anticoagulation protocol: Yes   Plan:  Continue heparin  infusion at 800 units/hr Check 8 hr. heparin  level at 1930 Daily heparin  level and CBCs ordered  F/u cards eval and recs  Amariah Kierstead, PharmD PGY-1 Pharmacy Resident [facility]  09/06/2023 1:34 PM

## 2023-09-06 NOTE — Progress Notes (Deleted)
 PHARMACY - ANTICOAGULATION CONSULT NOTE  Pharmacy Consult for heparin  Indication: chest pain/ACS  Allergies  Allergen Reactions   Acyclovir And Related Other (See Comments)    Unknown    Allegra [Fexofenadine] Hives   Amoxicillin Hives   Codeine Nausea And Vomiting   Elemental Sulfur Hives   Enalapril Maleate Cough   Hydrocodone  Bit-Homatrop Mbr Other (See Comments)    Unknown    Hydrocodone -Acetaminophen  Other (See Comments)    Unknown    Keflex [Cephalexin] Hives   Levofloxacin Other (See Comments)    insomnia   Lorabid [Loracarbef] Hives   Misc. Sulfonamide Containing Compounds Hives   Sulfa Antibiotics Hives   Sulfur Hives    Patient Measurements: Height: 5' 4 (162.6 cm) Weight: 68.7 kg (151 lb 7.3 oz) IBW/kg (Calculated) : 54.7 HEPARIN  DW (KG): 66.7  Vital Signs: Temp: 98.2 F (36.8 C) (07/13 1548) Temp Source: Oral (07/13 1548) BP: 109/58 (07/13 1548) Pulse Rate: 88 (07/13 1548)  Labs: Recent Labs    09/05/23 0430 09/05/23 0547 09/05/23 0722 09/05/23 1209 09/05/23 1415 09/05/23 2057 09/06/23 0246 09/06/23 1045 09/06/23 1904  HGB 11.6*  --   --   --   --   --  11.7*  --   --   HCT 35.3*  --   --   --   --   --  36.4  --   --   PLT 282  --   --   --   --   --  259  --   --   HEPARINUNFRC  --   --   --   --   --    < > 0.57 <0.10* 0.22*  CREATININE 0.68  --   --   --   --   --  0.79  --   --   TROPONINIHS  --    < > 542* 441* 409*  --   --   --   --    < > = values in this interval not displayed.    Estimated Creatinine Clearance: 50.7 mL/min (by C-G formula based on SCr of 0.79 mg/dL).   Medical History: Past Medical History:  Diagnosis Date   Anxiety    Arthritis    Asthma in child    Atherosclerotic heart disease of native coronary artery without angina pectoris    Cataracts, bilateral    Combined hyperlipidemia    Constipation, unspecified    COPD (chronic obstructive pulmonary disease) (HCC)    Coronary artery spasm (HCC)     Diabetic neuropathy (HCC)    DM (diabetes mellitus) with complications (HCC)    Dupuytren's contracture    Dyspepsia    Familial tremor    Fatigue    Fibromyalgia syndrome    GERD (gastroesophageal reflux disease)    Hashimoto's thyroiditis    Hiatal hernia    History of colon polyps    Hx of Hashimoto thyroiditis    Hypercholesteremia    Hypertension    Hypothyroidism, acquired, autoimmune    IBS (irritable bowel syndrome)    Meniere's disease of left ear    Migraine without aura and without status migrainosus, not intractable    Multinodular goiter (nontoxic)    Nonexudative macular degeneration    Raynaud's syndrome without gangrene    Situational stress    Tachycardia    Thyroiditis, autoimmune    Ulcerative colitis, unspecified, without complications (HCC)    Vitamin B 12 deficiency    Vitamin D  deficiency  Wears glasses     Assessment: 52 YOF presenting with syncope, elevated troponin, she is not on anticoagulation PTA.  7/13 AM: HL therapeutic at 0.57, Hgb stable, Plt stable. No s/sx of bleeding. 7/13 1145 AM: HL undetectable. Pt does not have IV line, getting replaced. Will retime heparin  to start once new line is placed.  PIV access lost at unknown time this morning. Was informed that line would be replaced. Unknown time when line was replaced.  Heparin  level at 1045 was undetectable. Heparin  infusion was restarted at previous rate 1128.  Due to unknown length of gap in heparin  infusion, will not re-bolus at this time, as the infusion has been running since at least 1128. Will keep heparin  infusion rate the same due to being therapeutic prior to losing IV access.  7/13 PM update: heparin  level subtherapeutic at 0.22, no s/sx of bleeding and no issues with access. Due to being previously therapeutic before loss of access, will not bolus. Will increase by 2 units/kg/hr.  Goal of Therapy:  Heparin  level 0.3-0.7 units/ml Monitor platelets by anticoagulation  protocol: Yes   Plan:  Increase heparin  infusion to 950 units/hr 8 hour heparin  will be drawn as the daily heparin  level Daily CBCs ordered  F/u cards eval and recs  R. Samual Satterfield, PharmD PGY-1 Acute Care Pharmacy Resident [facility] 09/06/2023 8:07 PM

## 2023-09-06 NOTE — Progress Notes (Signed)
 Progress Note  Patient Name: Shelby Lin Date of Encounter: 09/06/2023  Primary Cardiologist: Oneil Parchment, MD   Subjective   Doing well -- no recurrence of nausea. She just had some mild aspiration prior to my entering the room.  Inpatient Medications    Scheduled Meds:  aspirin   81 mg Oral Pre-Cath   sodium chloride  flush  3 mL Intravenous Q12H   sodium chloride  flush  3 mL Intravenous Q12H   sodium chloride  flush  3-10 mL Intravenous Q12H   Continuous Infusions:  sodium chloride      sodium chloride      heparin  800 Units/hr (09/05/23 2202)   PRN Meds: sodium chloride , sodium chloride  flush, sodium chloride  flush   Vital Signs    Vitals:   09/06/23 0411 09/06/23 0540 09/06/23 0609 09/06/23 0724  BP: (!) 96/58 93/60 107/62 111/60  Pulse: 78   64  Resp: 19 12 14 16   Temp: 97.8 F (36.6 C)   97.6 F (36.4 C)  TempSrc: Oral   Oral  SpO2: 93%   95%  Weight: 68.7 kg     Height:        Intake/Output Summary (Last 24 hours) at 09/06/2023 0818 Last data filed at 09/06/2023 0730 Gross per 24 hour  Intake 377.52 ml  Output 650 ml  Net -272.48 ml   Filed Weights   09/05/23 0233 09/05/23 1726 09/06/23 0411  Weight: 70 kg 66.7 kg 68.7 kg    Telemetry    Sinus rhythm with PVCs - Personally Reviewed  ECG    7/12 sinus rhythm with PVC - Personally Reviewed  Physical Exam   GEN: No acute distress.   Neck: No JVD Cardiac: RRR, no murmurs, rubs, or gallops.  Respiratory: Clear to auscultation bilaterally. GI: Soft, nontender, non-distended  MS: No edema; No deformity. Neuro:  Nonfocal  Psych: Normal affect   Labs    Chemistry Recent Labs  Lab 09/05/23 0430 09/06/23 0246  NA 139 139  K 4.0 3.7  CL 102 104  CO2 28 28  GLUCOSE 138* 125*  BUN 21 17  CREATININE 0.68 0.79  CALCIUM  9.6 9.0  PROT 5.5* 5.3*  ALBUMIN 3.2* 3.0*  AST 21 17  ALT 11 9  ALKPHOS 61 63  BILITOT 1.0 0.8  GFRNONAA >60 >60  ANIONGAP 9 7     Hematology Recent Labs  Lab  09/05/23 0430 09/06/23 0246  WBC 8.7 8.8  RBC 3.70* 3.80*  HGB 11.6* 11.7*  HCT 35.3* 36.4  MCV 95.4 95.8  MCH 31.4 30.8  MCHC 32.9 32.1  RDW 12.4 12.4  PLT 282 259    Cardiac EnzymesNo results for input(s): TROPONINI in the last 168 hours. No results for input(s): TROPIPOC in the last 168 hours.   BNPNo results for input(s): BNP, PROBNP in the last 168 hours.   DDimer  Recent Labs  Lab 09/05/23 1415  DDIMER 0.27     Summary of Pertinent studies    TTE: LV EF 45-50%, Gr I diastolic dysfunction    Patient Profile     83 y.o. female with a history of Meniere's, CA calcification, cath without significant CAD in 2019 admitted with NSTEMI and vertigo symptoms.  Assessment & Plan    NSTEMI TnI peak 542, trend relatively flat EF subnormal, which is new  Symptoms including nausea and diaphoresis are typical of both ACS and vertigo/vagal activation History of coronary vasospasm Coronary atherosclerosis/calcification noted on CT 03/2023 I think a coronary evaluation reasonable due to  decrease in EF and TnI > 500 Prepare for coronary angiogram tomorrow Continue heparin  drip, ASA 81, rosuvastatin   I explained the coronary angiogram procedure to the patient including low but nonzero risks of stroke, heart attack, and death.  Dizziness Primary complaint upon presentation Pt with history of Meniere's disease   Diabetes type II Last HbA1c here was 2023 -- 6.3  COPD Normal sats  Dyslipidenua Will order lipid profile Continue rosuvastatin  20  For questions or updates, please contact [facility] Please consult [email or web address] for contact info under Cardiology/STEMI.      Signed, Eulas FORBES Furbish, MD 09/06/2023, 8:18 AM

## 2023-09-06 NOTE — Progress Notes (Signed)
 [facility] night cross cover note:   I was notified by the patient's RN of the most recent blood pressure 93/60, with heart rates in the 80s.  Other vital signs appear stable, including afebrile oxygen  saturation in the mid 90s on room air.  This most recent blood pressure appears slightly lower than blood pressures earlier this evening, which were noted to be in the mid 90s to the low 100s mmHg.   Per my communication with the patient's RN, patient was awake at the time that this most recent blood pressure was taken. No new symptoms reported at the time.  I subsequently ordered a 500 cc LR bolus.    Eva Pore, DO Hospitalist

## 2023-09-06 NOTE — Progress Notes (Signed)
 PHARMACY - ANTICOAGULATION  Pharmacy Consult for heparin  Indication: chest pain/ACS Brief A/P: Heparin  level within goal range Continue Heparin  at current rate   Allergies  Allergen Reactions   Acyclovir And Related Other (See Comments)    Unknown    Allegra [Fexofenadine] Hives   Amoxicillin Hives   Codeine Nausea And Vomiting   Elemental Sulfur Hives   Enalapril Maleate Cough   Hydrocodone  Bit-Homatrop Mbr Other (See Comments)    Unknown    Hydrocodone -Acetaminophen  Other (See Comments)    Unknown    Keflex [Cephalexin] Hives   Levofloxacin Other (See Comments)    insomnia   Lorabid [Loracarbef] Hives   Misc. Sulfonamide Containing Compounds Hives   Sulfa Antibiotics Hives   Sulfur Hives    Patient Measurements: Height: 5' 4 (162.6 cm) Weight: 68.7 kg (151 lb 7.3 oz) IBW/kg (Calculated) : 54.7 HEPARIN  DW (KG): 66.7  Vital Signs: Temp: 97.6 F (36.4 C) (07/13 2048) Temp Source: Oral (07/13 2048) BP: 106/91 (07/13 2048) Pulse Rate: 92 (07/13 2048)  Labs: Recent Labs    09/05/23 0430 09/05/23 0547 09/05/23 0722 09/05/23 1209 09/05/23 1415 09/05/23 2057 09/06/23 0246 09/06/23 1045 09/06/23 1904 09/06/23 2230  HGB 11.6*  --   --   --   --   --  11.7*  --   --   --   HCT 35.3*  --   --   --   --   --  36.4  --   --   --   PLT 282  --   --   --   --   --  259  --   --   --   HEPARINUNFRC  --   --   --   --   --    < > 0.57 <0.10* 0.22* 0.33  CREATININE 0.68  --   --   --   --   --  0.79  --   --   --   TROPONINIHS  --    < > 542* 441* 409*  --   --   --   --   --    < > = values in this interval not displayed.    Estimated Creatinine Clearance: 50.7 mL/min (by C-G formula based on SCr of 0.79 mg/dL).  Assessment: 83 yo female with syncope and elevated troponin for heparin    Goal of Therapy:  Heparin  level 0.3-0.7 units/ml Monitor platelets by anticoagulation protocol: Yes   Plan:  No change to heparin   Cathlyn Arrant, PharmD, BCPS  09/06/2023 11:12  PM

## 2023-09-07 ENCOUNTER — Encounter (HOSPITAL_COMMUNITY)
Admission: EM | Disposition: A | Discharge status: Home / Self Care | Payer: Self-pay | Source: Home / Self Care | Attending: Family Medicine

## 2023-09-07 DIAGNOSIS — E785 Hyperlipidemia, unspecified: Secondary | ICD-10-CM

## 2023-09-07 DIAGNOSIS — R42 Dizziness and giddiness: Secondary | ICD-10-CM | POA: Diagnosis not present

## 2023-09-07 HISTORY — PX: LEFT HEART CATH AND CORONARY ANGIOGRAPHY: CATH118249

## 2023-09-07 LAB — BASIC METABOLIC PANEL WITH GFR
Anion gap: 10 (ref 5–15)
BUN: 10 mg/dL (ref 8–23)
CO2: 27 mmol/L (ref 22–32)
Calcium: 9 mg/dL (ref 8.9–10.3)
Chloride: 103 mmol/L (ref 98–111)
Creatinine, Ser: 0.63 mg/dL (ref 0.44–1.00)
GFR, Estimated: >60 mL/min (ref >60)
Glucose, Bld: 109 mg/dL — ABNORMAL HIGH (ref 70–99)
Potassium: 4.4 mmol/L (ref 3.5–5.1)
Sodium: 140 mmol/L (ref 135–145)

## 2023-09-07 LAB — CBC
HCT: 36 % (ref 36.0–46.0)
Hemoglobin: 11.9 g/dL — ABNORMAL LOW (ref 12.0–15.0)
MCH: 31.2 pg (ref 26.0–34.0)
MCHC: 33.1 g/dL (ref 30.0–36.0)
MCV: 94.2 fL (ref 80.0–100.0)
Platelets: 255 K/uL (ref 150–400)
RBC: 3.82 MIL/uL — ABNORMAL LOW (ref 3.87–5.11)
RDW: 12.6 % (ref 11.5–15.5)
WBC: 8.6 K/uL (ref 4.0–10.5)
nRBC: 0 % (ref 0.0–0.2)

## 2023-09-07 LAB — HEMOGLOBIN A1C
Hgb A1c MFr Bld: 6.4 % — ABNORMAL HIGH (ref 4.8–5.6)
Mean Plasma Glucose: 137 mg/dL

## 2023-09-07 LAB — HEPARIN LEVEL (UNFRACTIONATED): Heparin Unfractionated: 0.34 [IU]/mL (ref 0.30–0.70)

## 2023-09-07 MED ORDER — MIDAZOLAM HCL 2 MG/2ML IJ SOLN
INTRAMUSCULAR | Status: AC
  Filled 2023-09-07: qty 2

## 2023-09-07 MED ORDER — IOHEXOL 350 MG/ML SOLN
INTRAVENOUS | Status: DC | PRN
  Administered 2023-09-07: 28 mL

## 2023-09-07 MED ORDER — VERAPAMIL HCL 2.5 MG/ML IV SOLN
INTRAVENOUS | Status: DC | PRN
  Administered 2023-09-07: 10 mL via INTRA_ARTERIAL

## 2023-09-07 MED ORDER — LIDOCAINE HCL (PF) 1 % IJ SOLN
INTRAMUSCULAR | Status: DC | PRN
  Administered 2023-09-07 (×2): 2 mL via INTRADERMAL

## 2023-09-07 MED ORDER — LABETALOL HCL 5 MG/ML IV SOLN
10.0000 mg | INTRAVENOUS | Status: AC | PRN
Start: 2023-09-07 — End: 2023-09-07

## 2023-09-07 MED ORDER — VERAPAMIL HCL 2.5 MG/ML IV SOLN
INTRAVENOUS | Status: AC
Start: 2023-09-07 — End: 2023-09-07
  Filled 2023-09-07: qty 2

## 2023-09-07 MED ORDER — FENTANYL CITRATE (PF) 100 MCG/2ML IJ SOLN
INTRAMUSCULAR | Status: AC
  Filled 2023-09-07: qty 2

## 2023-09-07 MED ORDER — HEPARIN SODIUM (PORCINE) 1000 UNIT/ML IJ SOLN
INTRAMUSCULAR | Status: DC | PRN
  Administered 2023-09-07: 3500 [IU] via INTRAVENOUS

## 2023-09-07 MED ORDER — HYDRALAZINE HCL 20 MG/ML IJ SOLN: 10.0000 mg | INTRAMUSCULAR | Status: AC | PRN

## 2023-09-07 MED ORDER — ONDANSETRON HCL 4 MG/2ML IJ SOLN: 4.0000 mg | Freq: Four times a day (QID) | INTRAMUSCULAR | Status: DC | PRN

## 2023-09-07 MED ORDER — HEPARIN (PORCINE) IN NACL 1000-0.9 UT/500ML-% IV SOLN
INTRAVENOUS | Status: DC | PRN
  Administered 2023-09-07 (×2): 500 mL

## 2023-09-07 MED ORDER — MIDAZOLAM HCL 2 MG/2ML IJ SOLN
INTRAMUSCULAR | Status: DC | PRN
  Administered 2023-09-07: 1 mg via INTRAVENOUS

## 2023-09-07 MED ORDER — SODIUM CHLORIDE 0.9% FLUSH: 3.0000 mL | INTRAVENOUS | Status: DC | PRN

## 2023-09-07 MED ORDER — LIDOCAINE HCL (PF) 1 % IJ SOLN
INTRAMUSCULAR | Status: AC
  Filled 2023-09-07: qty 30

## 2023-09-07 MED ORDER — FENTANYL CITRATE (PF) 100 MCG/2ML IJ SOLN
INTRAMUSCULAR | Status: DC | PRN
  Administered 2023-09-07: 25 ug via INTRAVENOUS

## 2023-09-07 MED ORDER — HEPARIN SODIUM (PORCINE) 1000 UNIT/ML IJ SOLN
INTRAMUSCULAR | Status: AC
  Filled 2023-09-07: qty 10

## 2023-09-07 MED ORDER — SODIUM CHLORIDE 0.9 % IV SOLN: 250.0000 mL | INTRAVENOUS | Status: DC | PRN

## 2023-09-07 MED ORDER — SODIUM CHLORIDE 0.9% FLUSH: 3.0000 mL | Freq: Two times a day (BID) | INTRAVENOUS | Status: DC

## 2023-09-07 NOTE — TOC CM/SW Note (Signed)
 Transition of Care [facility]) - Inpatient Brief Assessment   Patient Details  Name: Shelby Lin MRN: [id number] Date of Birth: March 14, 1940  Transition of Care [facility]) CM/SW Contact:    Waddell Barnie Rama, RN Phone Number: 09/07/2023, 3:30 PM   Clinical Narrative: From home with spouse, has PCP and insurance on file, states has no HH services in place at this time, has a cane, home oxygen  at night 2 liters with [employer] at home.  States spouse will transport them home at [employer] and family is support system, states gets medications from [facility] in [employer] on [address].  Pta self ambulatory with cane.   For heart cath today.    Transition of Care Asessment: Insurance and Status: Insurance coverage has been reviewed Patient has primary care physician: Yes Home environment has been reviewed: from home with spouse Prior level of function:: ambulatory with cane Prior/Current Home Services: Current home services (cane, home oxygen  at night 2 liters with [employer]) Social Drivers of Health Review: SDOH reviewed no interventions necessary Readmission risk has been reviewed: Yes Transition of care needs: no transition of care needs at this time

## 2023-09-07 NOTE — Progress Notes (Signed)
  Progress Note  Patient Name: Shelby Lin Date of Encounter: 09/07/2023 [facility] Cardiologist: Oneil Parchment, MD   Interval Summary   No current chest pain.  Daughter in room.  States that she has a planned appointment but has not scheduled that yet with me for October.  Vital Signs Vitals:   09/07/23 0512 09/07/23 0645 09/07/23 0714 09/07/23 0738  BP: 107/61 (!) 105/58 107/68   Pulse: 82  78   Resp: 19  19   Temp: 97.7 F (36.5 C)  (!) 97.4 F (36.3 C)   TempSrc: Oral  Oral   SpO2: 92%  92% 93%  Weight: 68.2 kg     Height:        Intake/Output Summary (Last 24 hours) at 09/07/2023 0909 Last data filed at 09/07/2023 0514 Gross per 24 hour  Intake 372.37 ml  Output 700 ml  Net -327.63 ml      09/07/2023    5:12 AM 09/06/2023    4:11 AM 09/05/2023    5:26 PM  Last 3 Weights  Weight (lbs) 150 lb 6 oz 151 lb 7.3 oz 147 lb 0.8 oz  Weight (kg) 68.21 kg 68.7 kg 66.7 kg      Telemetry/ECG  No adverse arrhythmias- Personally Reviewed  Nuclear stress test 2022-no ischemia  Physical Exam  GEN: No acute distress.   Neck: No JVD Cardiac: RRR, no murmurs, rubs, or gallops.  Respiratory: Clear to auscultation bilaterally. GI: Soft, nontender, non-distended  MS: No edema  Assessment & Plan   83 year old with prior cardiac catheterization in 2019 showing no significant CAD here with non-STEMI vertigo Mnire's  Non-STEMI/coronary artery calcification - Troponin peak 542 -Ejection fraction on echocardiogram 45 to 50% with grade 1 diastolic dysfunction mildly decreased - Symptoms of nausea as well as diaphoresis could be ACS or perhaps vertigo vagal -Dr. Cleotilde felt as though coronary evaluation was reasonable due to decrease in EF as well as troponin increase greater than 500. -Currently on add-on board for cardiac catheterization.  She is NPO. -Continuing with heparin  drip IV, aspirin  81, statin. -Risk and benefits have been explained.  Daughter in room as  well.  Dizziness/constipation - Primary complaint upon presentation with history of Mnire's disease.  Dr. Burnette sees her for GI chronic constipation.  Diabetes type 2  -Reasonable control A1c 6.3  Hyperlipidemia - Continue with rosuvastatin  20 mg a day, LDL goal less than 55.  At prior check 49 on 04/10/2020.  Prior tobacco use Has quit smoking in 2022  COPD - 2 L of oxygen  at night    For questions or updates, please contact [facility] Please consult [email or web address] for contact info under       Signed, Oneil Parchment, MD

## 2023-09-07 NOTE — Plan of Care (Signed)
  Problem: Education: Goal: Knowledge of General Education information will improve Description: Including pain rating scale, medication(s)/side effects and non-pharmacologic comfort measures Outcome: Progressing   Problem: Clinical Measurements: Goal: Diagnostic test results will improve Outcome: Progressing Goal: Respiratory complications will improve Outcome: Progressing   Problem: Activity: Goal: Risk for activity intolerance will decrease Outcome: Progressing   Problem: Coping: Goal: Level of anxiety will decrease Outcome: Progressing

## 2023-09-07 NOTE — Progress Notes (Signed)
 [facility] PROGRESS NOTE  Shelby Lin (DOB: September 19, 1940) FMW:[id number] PCP: Claudene Pellet, MD  Brief Narrative: Shelby Lin is a 83 y.o. female with a history of multiple medication allergies, COPD, CAD, HTN, HLD, T2DM, diabetic neuropathy, fibromyalgia, GERD hypothyroidism, Meniere's disease who presented to the ED on 09/05/2023 after having worse than usual vertigo requiring her to crawl at home. Work up included brain MRI which was negative and notably elevated troponin. Cardiology is planning coronary angiography 7/14.  Subjective: Didn't sleep well and now has an IV in each arm which are generally bothersome but has no other physical complaints. Specifically no chest pain or dyspnea. She is anxious about what they might find today.   Objective: BP 107/71 (BP Location: Left Arm)   Pulse 76   Temp 97.7 F (36.5 C) (Oral)   Resp 18   Ht 5' 4 (1.626 m)   Wt 68.2 kg   SpO2 94%   BMI 25.81 kg/m   Gen: Elderly pleasant female in no distress Pulm: Clear, nonlabored  CV: RRR, no MRG GI: Soft, NT, ND, +BS  Neuro: Alert and oriented. No new focal deficits. Ext: Warm, no deformities. Skin: PIV sites c/d/I, no new rashes, lesions or ulcers on visualized skin   Assessment & Plan: Dizziness/ presyncope: Suspect chronic vertigo based on symptom report. No central lesion on brain MRI. Chronic meniere's contributing. ?if a primary cardiac event has contributed.  - Continue meclizine  prn. Improving symptoms.  - Possibly precipitated by hypovolemia but will not continue IVF at this time.    NSTEMI: Newly depressed LVEF, troponin up to 542 but trending downward. Coronary calcifications noted on imaging. Also reported hx coronary vasospasm.  - Catheterization planned 7/14 per cardiology, continue heparin  gtt and ASA 81mg  and high intensity statin.   Fibromyalgia:  - Restarted home cymbalta .     Hypothyroidism: Last TSH was wnl.  - Continue home synthroid    T2DM: Last HbA1c  6.3%.  - Continue SSI.    COPD: Stable emphysematous changes on CT chest without acute findings.  - No wheezing, continue breztri  formulary equivalent and albuterol  prn.    GERD:  - Continue PPI  Bernardino KATHEE Come, MD [facility] [email or web address] 09/07/2023, 11:38 AM

## 2023-09-07 NOTE — Progress Notes (Signed)
 PHARMACY - ANTICOAGULATION CONSULT NOTE  Pharmacy Consult for Heparin  Indication: chest pain/ACS  Allergies  Allergen Reactions   Acyclovir And Related Other (See Comments)    Unknown    Allegra [Fexofenadine] Hives   Amoxicillin Hives   Codeine Nausea And Vomiting   Elemental Sulfur Hives   Enalapril Maleate Cough   Hydrocodone  Bit-Homatrop Mbr Other (See Comments)    Unknown    Hydrocodone -Acetaminophen  Other (See Comments)    Unknown    Keflex [Cephalexin] Hives   Levofloxacin Other (See Comments)    insomnia   Lorabid [Loracarbef] Hives   Misc. Sulfonamide Containing Compounds Hives   Sulfa Antibiotics Hives   Sulfur Hives    Patient Measurements: Height: 5' 4 (162.6 cm) Weight: 68.2 kg (150 lb 6 oz) IBW/kg (Calculated) : 54.7 HEPARIN  DW (KG): 66.7  Vital Signs: Temp: 97.7 F (36.5 C) (07/14 0957) Temp Source: Oral (07/14 0957) BP: 107/71 (07/14 0957) Pulse Rate: 76 (07/14 0957)  Labs: Recent Labs    09/05/23 0430 09/05/23 0547 09/05/23 0722 09/05/23 1209 09/05/23 1415 09/05/23 2057 09/06/23 0246 09/06/23 1045 09/06/23 1904 09/06/23 2230 09/07/23 0812 09/07/23 1019  HGB 11.6*  --   --   --   --   --  11.7*  --   --   --  11.9*  --   HCT 35.3*  --   --   --   --   --  36.4  --   --   --  36.0  --   PLT 282  --   --   --   --   --  259  --   --   --  255  --   HEPARINUNFRC  --   --   --   --   --    < > 0.57   < > 0.22* 0.33  --  0.34  CREATININE 0.68  --   --   --   --   --  0.79  --   --   --   --  0.63  TROPONINIHS  --    < > 542* 441* 409*  --   --   --   --   --   --   --    < > = values in this interval not displayed.    Estimated Creatinine Clearance: 50.6 mL/min (by C-G formula based on SCr of 0.63 mg/dL).  Assessment: 83 yo F presenting with syncope and elevated troponin. Pharmacy consulted for IV heparin  dosing for chest pain/ACS. Not on anticoagulation prior to admission.   Heparin  level remains therapeutic (0.34) on 800 units/hr.   CBC stable. For cardiac cath today.  Goal of Therapy:  Heparin  level 0.3-0.7 units/ml Monitor platelets by anticoagulation protocol: Yes   Plan:  Continue heparin  drip at 800 units/hr Daily heparin  level and CBC while on heparin . Follow up post-cath.  Genaro Zebedee Calin, RPh 09/07/2023,11:39 AM

## 2023-09-07 NOTE — Interval H&P Note (Signed)
 History and Physical Interval Note:  09/07/2023 5:09 PM  Shelby Lin  has presented today for surgery, with the diagnosis of NSTEMI.  The various methods of treatment have been discussed with the patient and family. After consideration of risks, benefits and other options for treatment, the patient has consented to  Procedure(s): LEFT HEART CATH AND CORONARY ANGIOGRAPHY (N/A)  PERCUTANEOUS CORONARY INTERVENTION  as a surgical intervention.  The patient's history has been reviewed, patient examined, no change in status, stable for surgery.  I have reviewed the patient's chart and labs.  Questions were answered to the patient's satisfaction.    Cath Lab Visit (complete for each Cath Lab visit)  Clinical Evaluation Leading to the Procedure:   ACS: Yes.    Non-ACS:    Anginal Classification: CCS III  Anti-ischemic medical therapy: Minimal Therapy (1 class of medications)  Non-Invasive Test Results: Equivocal test results  Prior CABG: No previous CABG    Alm Clay

## 2023-09-07 NOTE — H&P (View-Only) (Signed)
  Progress Note  Patient Name: Shelby Lin Date of Encounter: 09/07/2023 [facility] Cardiologist: Oneil Parchment, MD   Interval Summary   No current chest pain.  Daughter in room.  States that she has a planned appointment but has not scheduled that yet with me for October.  Vital Signs Vitals:   09/07/23 0512 09/07/23 0645 09/07/23 0714 09/07/23 0738  BP: 107/61 (!) 105/58 107/68   Pulse: 82  78   Resp: 19  19   Temp: 97.7 F (36.5 C)  (!) 97.4 F (36.3 C)   TempSrc: Oral  Oral   SpO2: 92%  92% 93%  Weight: 68.2 kg     Height:        Intake/Output Summary (Last 24 hours) at 09/07/2023 0909 Last data filed at 09/07/2023 0514 Gross per 24 hour  Intake 372.37 ml  Output 700 ml  Net -327.63 ml      09/07/2023    5:12 AM 09/06/2023    4:11 AM 09/05/2023    5:26 PM  Last 3 Weights  Weight (lbs) 150 lb 6 oz 151 lb 7.3 oz 147 lb 0.8 oz  Weight (kg) 68.21 kg 68.7 kg 66.7 kg      Telemetry/ECG  No adverse arrhythmias- Personally Reviewed  Nuclear stress test 2022-no ischemia  Physical Exam  GEN: No acute distress.   Neck: No JVD Cardiac: RRR, no murmurs, rubs, or gallops.  Respiratory: Clear to auscultation bilaterally. GI: Soft, nontender, non-distended  MS: No edema  Assessment & Plan   83 year old with prior cardiac catheterization in 2019 showing no significant CAD here with non-STEMI vertigo Mnire's  Non-STEMI/coronary artery calcification - Troponin peak 542 -Ejection fraction on echocardiogram 45 to 50% with grade 1 diastolic dysfunction mildly decreased - Symptoms of nausea as well as diaphoresis could be ACS or perhaps vertigo vagal -Dr. Cleotilde felt as though coronary evaluation was reasonable due to decrease in EF as well as troponin increase greater than 500. -Currently on add-on board for cardiac catheterization.  She is NPO. -Continuing with heparin  drip IV, aspirin  81, statin. -Risk and benefits have been explained.  Daughter in room as  well.  Dizziness/constipation - Primary complaint upon presentation with history of Mnire's disease.  Dr. Burnette sees her for GI chronic constipation.  Diabetes type 2  -Reasonable control A1c 6.3  Hyperlipidemia - Continue with rosuvastatin  20 mg a day, LDL goal less than 55.  At prior check 49 on 04/10/2020.  Prior tobacco use Has quit smoking in 2022  COPD - 2 L of oxygen  at night    For questions or updates, please contact [facility] Please consult [email or web address] for contact info under       Signed, Oneil Parchment, MD

## 2023-09-07 NOTE — Plan of Care (Signed)
   Problem: Education: Goal: Knowledge of General Education information will improve Description: Including pain rating scale, medication(s)/side effects and non-pharmacologic comfort measures Outcome: Progressing   Problem: Activity: Goal: Risk for activity intolerance will decrease Outcome: Progressing   Problem: Coping: Goal: Level of anxiety will decrease Outcome: Progressing

## 2023-09-08 ENCOUNTER — Encounter (HOSPITAL_COMMUNITY): Payer: Self-pay | Admitting: Cardiology

## 2023-09-08 DIAGNOSIS — R42 Dizziness and giddiness: Secondary | ICD-10-CM | POA: Diagnosis not present

## 2023-09-08 LAB — CBC
HCT: 35.2 % — ABNORMAL LOW (ref 36.0–46.0)
Hemoglobin: 11.3 g/dL — ABNORMAL LOW (ref 12.0–15.0)
MCH: 30.7 pg (ref 26.0–34.0)
MCHC: 32.1 g/dL (ref 30.0–36.0)
MCV: 95.7 fL (ref 80.0–100.0)
Platelets: 255 K/uL (ref 150–400)
RBC: 3.68 MIL/uL — ABNORMAL LOW (ref 3.87–5.11)
RDW: 12.3 % (ref 11.5–15.5)
WBC: 9.5 K/uL (ref 4.0–10.5)
nRBC: 0 % (ref 0.0–0.2)

## 2023-09-08 MED ORDER — ORAL CARE MOUTH RINSE
15.0000 mL | OROMUCOSAL | Status: DC | PRN
Start: 2023-09-08 — End: 2023-09-08

## 2023-09-08 NOTE — Progress Notes (Addendum)
  Progress Note  Patient Name: Shelby Lin Date of Encounter: 09/08/2023 [facility] Cardiologist: Oneil Parchment, MD   Interval Summary   No recurrent chest pain.  Catheterization reassuring.  No CAD.  Vital Signs Vitals:   09/07/23 2109 09/08/23 0343 09/08/23 0755 09/08/23 0838  BP: (!) 109/54 (!) 123/59 119/63   Pulse:  79 85   Resp: 20 18 17    Temp:  98.3 F (36.8 C) 97.6 F (36.4 C)   TempSrc:  Oral Oral   SpO2:  94% 97% 97%  Weight:  66.9 kg    Height:        Intake/Output Summary (Last 24 hours) at 09/08/2023 0842 Last data filed at 09/08/2023 0352 Gross per 24 hour  Intake 240 ml  Output 420 ml  Net -180 ml      09/08/2023    3:43 AM 09/07/2023    5:12 AM 09/06/2023    4:11 AM  Last 3 Weights  Weight (lbs) 147 lb 6.4 oz 150 lb 6 oz 151 lb 7.3 oz  Weight (kg) 66.86 kg 68.21 kg 68.7 kg      Telemetry/ECG  Normal sinus rhythm.- Personally Reviewed  Nuclear stress test 2022-no ischemia  Cardiac catheterization 09/07/2023-no CAD, no obstruction.  Physical Exam  GEN: No acute distress.   Neck: No JVD Cardiac: RRR, no murmurs, rubs, or gallops.  No changes cath site with small hematoma. Good pulses.  Respiratory: Clear to auscultation bilaterally. GI: Soft, nontender, non-distended  MS: No edema  Assessment & Plan   83 year old with prior cardiac catheterization in 2019 showing no significant CAD here with non-STEMI vertigo Mnire's  Non-STEMI/coronary artery calcification-no signs of obstructive coronary disease. - Troponin peak 542 -Ejection fraction on echocardiogram 45 to 50% with grade 1 diastolic dysfunction mildly decreased.  Currently asymptomatic. - I am comfortable with her discharge. -Aspirin  81, statin given her coronary calcification however nonobstructive CAD.. . Dizziness/constipation - Primary complaint upon presentation with history of Mnire's disease.  Dr. Burnette sees her for GI chronic constipation.  No changes  Diabetes  type 2  -Reasonable control A1c 6.3, great job  Hyperlipidemia - Continue with rosuvastatin  20 mg a day, LDL goal less than 55.  At prior check 49 on 04/10/2020.  No changes  Prior tobacco use Has quit smoking in 2022.  Excellent  COPD - 2 L of oxygen  at night  Okay for DC from cardiology perspective. Will get follow up.    For questions or updates, please contact [facility] Please consult [email or web address] for contact info under       Signed, Oneil Parchment, MD

## 2023-09-08 NOTE — TOC Transition Note (Signed)
 Transition of Care [facility]) - Discharge Note   Patient Details  Name: Shelby Lin MRN: [id number] Date of Birth: [birth year]  [facility]) CM/SW Contact:  Waddell Barnie Rama, RN Phone Number: 09/08/2023, 9:39 AM   Clinical Narrative:    For dc today, has transport home, has no needs.         Patient Goals and CMS Choice            Discharge Placement                       Discharge Plan and Services Additional resources added to the After Visit Summary for                                       Social Drivers of Health (SDOH) Interventions SDOH Screenings   Food Insecurity: Patient Declined (09/05/2023)  Housing: Patient Declined (09/05/2023)  Transportation Needs: Patient Declined (09/05/2023)  Utilities: Patient Declined (09/05/2023)  Social Connections: Moderately Integrated (09/05/2023)  Tobacco Use: Medium Risk (09/05/2023)     Readmission Risk Interventions    09/07/2023    3:29 PM  Readmission Risk Prevention Plan  Post Dischage Appt Complete  Medication Screening Complete  Transportation Screening Complete

## 2023-09-08 NOTE — Progress Notes (Signed)
 Discharge instructions reviewed with pt. Copy of instructions given to pt. No changes in medications. Pt will be d/c'd via wheelchair with belongings and will be escorted by hospital volunteer.   Madelin Barefoot, RN SWOT

## 2023-09-08 NOTE — Discharge Summary (Signed)
 Physician Discharge Summary   Patient: Shelby Lin MRN: [id number] DOB: 10/06/1940  Admit date:     09/05/2023  Discharge date: 09/08/23  Discharge Physician: Bernardino KATHEE Come   PCP: Claudene Pellet, MD   Recommendations at discharge:  Continue routine follow up for Meniere's disease Follow up with cardiology after discharge. Note LVEF 45-50% with nonobstructive catheterization.   Discharge Diagnoses: Principal Problem:   Dizziness Active Problems:   Hypothyroidism, acquired, autoimmune   Stage 3 severe COPD by GOLD classification (HCC)   Gastroesophageal reflux disease   Type 2 diabetes mellitus with unspecified complications (HCC)   Vitamin B12 deficiency (non anemic)   Ulcerative colitis (HCC)   Chronic obstructive pulmonary disease (HCC)   GERD (gastroesophageal reflux disease)   Hypothyroidism  Resolved Problems:   * No resolved hospital problems. [facility] Course: No notes on file  Assessment and Plan: No notes have been filed under this hospital service. Service: Hospitalist     {Tip this will not be part of the note when signed Body mass index is 25.3 kg/m. , ,  (Optional):26781}  {(NOTE) Pain control PDMP Statment (Optional):26782} Consultants: *** Procedures performed: ***  Disposition: {Plan; Disposition:26390} Diet recommendation:  {Diet_Plan:26776} DISCHARGE MEDICATION: Allergies as of 09/08/2023       Reactions   Acyclovir And Related Other (See Comments)   Unknown    Allegra [fexofenadine] Hives   Amoxicillin Hives   Codeine Nausea And Vomiting   Elemental Sulfur Hives   Enalapril Maleate Cough   Hydrocodone  Bit-homatrop Mbr Other (See Comments)   Unknown    Hydrocodone -acetaminophen  Other (See Comments)   Unknown    Keflex [cephalexin] Hives   Levofloxacin Other (See Comments)   insomnia   Lorabid [loracarbef] Hives   Misc. Sulfonamide Containing Compounds Hives   Sulfa Antibiotics Hives   Sulfur Hives        Medication List      TAKE these medications    albuterol  108 (90 Base) MCG/ACT inhaler Commonly known as: VENTOLIN  HFA Inhale 1-2 puffs into the lungs every 6 (six) hours as needed for wheezing or shortness of breath.   aspirin  EC 81 MG tablet Take 1 tablet (81 mg total) by mouth daily.   DULoxetine  30 MG capsule Commonly known as: CYMBALTA  Take 30 mg by mouth 2 (two) times daily.   furosemide 20 MG tablet Commonly known as: LASIX Take 20 mg by mouth as needed.   hydrochlorothiazide 25 MG tablet Commonly known as: HYDRODIURIL TAKE 1 TABLET BY MOUTH EVERY DAY IN THE MORNING   Lagevrio 200 MG Caps capsule Generic drug: molnupiravir EUA Take 4 capsules by mouth 2 (two) times daily.   LORazepam  0.5 MG tablet Commonly known as: ATIVAN  Take 0.25 mg by mouth See admin instructions. Take 1/2 tablet by mouth 2-3 times a day as needed for anxiety.   losartan 100 MG tablet Commonly known as: COZAAR Take 100 mg by mouth daily.   multivitamin tablet Take 1 tablet by mouth daily.   omeprazole 20 MG capsule Commonly known as: PRILOSEC Take 20 mg by mouth daily.   rosuvastatin  20 MG tablet Commonly known as: CRESTOR  Take 1 tablet (20 mg total) by mouth daily.   Synthroid  88 MCG tablet Generic drug: levothyroxine  TAKE DAILY BEFORE BREAKFAST. 1 TABLET 6 DAYS EACH WEEK, BUT TAKE ONLY 1/2 TABLET ON THE 7TH DAY What changed: See the new instructions.   Trelegy Ellipta  100-62.5-25 MCG/ACT Aepb Generic drug: Fluticasone -Umeclidin-Vilant INHALE 1 PUFF BY MOUTH EVERY DAY  VITAMIN B 12 PO Take by mouth daily.   VITAMIN D  (CHOLECALCIFEROL) PO Take 50,000 Units by mouth once a week. On Wednesdays        Follow-up Information     Claudene Pellet, MD Follow up.   Specialty: Family Medicine Contact information: [address] [place] [address] [phone]                Discharge Exam: Fredricka Weights   09/06/23 0411 09/07/23 0512 09/08/23 0343  Weight: 68.7 kg  68.2 kg 66.9 kg   ***  Condition at discharge: {DC Condition:26389}  The results of significant diagnostics from this hospitalization (including imaging, microbiology, ancillary and laboratory) are listed below for reference.   Imaging Studies: CARDIAC CATHETERIZATION Result Date: 09/07/2023 Images from the original result were not included. Dominance: Left Angiographically normal coronary arteries with minimal irregularities. Left Dominant System Normal LVEDP RECOMMENDATIONS   Anticipated discharge date to be determined.   Evaluate for nonischemic etiology for elevated troponin   No indication for antiplatelet therapy at this time . Alm Clay, MD  CT CHEST W CONTRAST Result Date: 09/05/2023 CLINICAL DATA:  Sepsis. EXAM: CT CHEST WITH CONTRAST TECHNIQUE: Multidetector CT imaging of the chest was performed during intravenous contrast administration. RADIATION DOSE REDUCTION: This exam was performed according to the departmental dose-optimization program which includes automated exposure control, adjustment of the mA and/or kV according to patient size and/or use of iterative reconstruction technique. CONTRAST:  50mL OMNIPAQUE  IOHEXOL  350 MG/ML SOLN COMPARISON:  Chest CT 04/05/2023 FINDINGS: Cardiovascular: The heart is normal in size. No pericardial effusion. The aorta is normal in caliber. No dissection. Stable atherosclerotic calcification, mainly around the aortic arch. Stable scattered coronary artery calcifications. Mediastinum/Nodes: Scattered small mediastinal and hilar lymph nodes but no mass or overt adenopathy. Moderate to large hiatal hernia noted. Lungs/Pleura: No acute pulmonary process. No infiltrates, edema or effusions. Underlying emphysematous changes and pulmonary scarring. No worrisome pulmonary lesions or pulmonary nodules. Stable scattered 2-3 mm subpleural nodules in both lungs. Stable biapical pleural and parenchymal scarring changes. Upper Abdomen: No significant upper  abdominal findings. Moderate-sized duodenal diverticulum. Stable aortic calcifications. Status post cholecystectomy. No biliary dilatation. Musculoskeletal: No breast masses, supraclavicular or axillary adenopathy. The bony thorax is intact. IMPRESSION: 1. No acute pulmonary findings. 2. Underlying emphysematous changes and pulmonary scarring. 3. Stable scattered 2-3 mm subpleural nodules in both lungs. 4. No mediastinal or hilar mass or adenopathy. 5. Moderate to large hiatal hernia. Aortic Atherosclerosis (ICD10-I70.0) and Emphysema (ICD10-J43.9). Electronically Signed   By: MYRTIS Stammer M.D.   On: 09/05/2023 16:13   ECHOCARDIOGRAM COMPLETE Result Date: 09/05/2023    ECHOCARDIOGRAM REPORT   Patient Name:   MARALEE HIGUCHI Date of Exam: 09/05/2023 Medical Rec #:  [id number]      Height:       64.0 in Accession #:    [id number]     Weight:       154.3 lb Date of Birth:  02-24-1941      BSA:          1.752 m Patient Age:    83 years       BP:           104/50 mmHg Patient Gender: F              HR:           21 bpm. Exam Location:  Inpatient Procedure: 2D Echo, Cardiac Doppler and Color Doppler (Both Spectral and  Color            Flow Doppler were utilized during procedure). Indications:    Elevated Troponin  History:        Patient has no prior history of Echocardiogram examinations.                 Risk Factors:Hypertension.  Sonographer:    Philomena Daring Referring Phys: LOGAN N LOCKWOOD IMPRESSIONS  1. Distal septal/apical hypokinesis . Left ventricular ejection fraction, by estimation, is 45 to 50%. The left ventricle has mildly decreased function. The left ventricle demonstrates regional wall motion abnormalities (see scoring diagram/findings for  description). The left ventricular internal cavity size was mildly dilated. There is mild asymmetric left ventricular hypertrophy of the basal and septal segments. Left ventricular diastolic parameters are consistent with Grade I diastolic dysfunction (impaired  relaxation).  2. Right ventricular systolic function is normal. The right ventricular size is normal.  3. The mitral valve is abnormal. No evidence of mitral valve regurgitation. No evidence of mitral stenosis.  4. The aortic valve is tricuspid. There is mild calcification of the aortic valve. Aortic valve regurgitation is not visualized. Aortic valve sclerosis is present, with no evidence of aortic valve stenosis.  5. The inferior vena cava is normal in size with greater than 50% respiratory variability, suggesting right atrial pressure of 3 mmHg. FINDINGS  Left Ventricle: Distal septal/apical hypokinesis. Left ventricular ejection fraction, by estimation, is 45 to 50%. The left ventricle has mildly decreased function. The left ventricle demonstrates regional wall motion abnormalities. Strain was performed  and the global longitudinal strain is indeterminate. The left ventricular internal cavity size was mildly dilated. There is mild asymmetric left ventricular hypertrophy of the basal and septal segments. Left ventricular diastolic parameters are consistent with Grade I diastolic dysfunction (impaired relaxation). Right Ventricle: The right ventricular size is normal. No increase in right ventricular wall thickness. Right ventricular systolic function is normal. Left Atrium: Left atrial size was normal in size. Right Atrium: Right atrial size was normal in size. Pericardium: There is no evidence of pericardial effusion. Mitral Valve: The mitral valve is abnormal. There is mild thickening of the mitral valve leaflet(s). There is mild calcification of the mitral valve leaflet(s). Mild mitral annular calcification. No evidence of mitral valve regurgitation. No evidence of mitral valve stenosis. Tricuspid Valve: The tricuspid valve is normal in structure. Tricuspid valve regurgitation is not demonstrated. No evidence of tricuspid stenosis. Aortic Valve: The aortic valve is tricuspid. There is mild calcification of the  aortic valve. Aortic valve regurgitation is not visualized. Aortic valve sclerosis is present, with no evidence of aortic valve stenosis. Pulmonic Valve: The pulmonic valve was normal in structure. Pulmonic valve regurgitation is not visualized. No evidence of pulmonic stenosis. Aorta: The aortic root is normal in size and structure. Venous: The inferior vena cava is normal in size with greater than 50% respiratory variability, suggesting right atrial pressure of 3 mmHg. IAS/Shunts: The interatrial septum appears to be lipomatous. No atrial level shunt detected by color flow Doppler. Additional Comments: 3D was performed not requiring image post processing on an independent workstation and was indeterminate.  LEFT VENTRICLE PLAX 2D LVIDd:         3.50 cm   Diastology LVIDs:         2.60 cm   LV e' medial:    5.77 cm/s LV PW:         1.20 cm   LV E/e' medial:  10.9 LV IVS:  1.30 cm   LV e' lateral:   6.42 cm/s LVOT diam:     2.20 cm   LV E/e' lateral: 9.8 LV SV:         74 LV SV Index:   42 LVOT Area:     3.80 cm  RIGHT VENTRICLE             IVC RV S prime:     11.10 cm/s  IVC diam: 1.40 cm TAPSE (M-mode): 1.7 cm LEFT ATRIUM             Index        RIGHT ATRIUM           Index LA diam:        2.60 cm 1.48 cm/m   RA Area:     13.00 cm LA Vol (A2C):   29.1 ml 16.61 ml/m  RA Volume:   32.20 ml  18.38 ml/m LA Vol (A4C):   18.1 ml 10.33 ml/m LA Biplane Vol: 25.3 ml 14.44 ml/m  AORTIC VALVE LVOT Vmax:   87.80 cm/s LVOT Vmean:  61.200 cm/s LVOT VTI:    0.195 m  AORTA Ao Root diam: 3.00 cm MITRAL VALVE MV Area (PHT): 4.21 cm     SHUNTS MV Decel Time: 180 msec     Systemic VTI:  0.20 m MV E velocity: 62.90 cm/s   Systemic Diam: 2.20 cm MV A velocity: 107.00 cm/s MV E/A ratio:  0.59 Maude Emmer MD Electronically signed by Maude Emmer MD Signature Date/Time: 09/05/2023/1:43:09 PM    Final    DG Chest Port 1 View Result Date: 09/05/2023 CLINICAL DATA:  221909 Elevated troponin [id number] EXAM: PORTABLE CHEST 1  VIEW COMPARISON:  March 30, 2023, December 30, 2021 FINDINGS: The cardiomediastinal silhouette is unchanged in contour.Emphysematous changes. No pleural effusion. No pneumothorax. No acute pleuroparenchymal abnormality. IMPRESSION: No acute cardiopulmonary abnormality. Electronically Signed   By: Corean Salter M.D.   On: 09/05/2023 12:59   MR BRAIN WO CONTRAST Result Date: 09/05/2023 EXAM: MRI BRAIN WITHOUT CONTRAST 09/05/2023 09:25:46 AM TECHNIQUE: Multiplanar multisequence MRI of the head/brain was performed without the administration of intravenous contrast. COMPARISON: 05/20/2011 CLINICAL HISTORY: Neuro deficit, acute, stroke suspected. Sudden onset of dizziness. Personal history of meniere's disease. FINDINGS: BRAIN AND VENTRICLES: No acute infarct. No intracranial hemorrhage. No mass. No midline shift. No hydrocephalus. The sella is unremarkable. Normal flow voids. Progressive moderate generalized atrophy and white matter disease is present bilaterally. White matter changes extended in the thalami and brainstem. ORBITS: No acute abnormality. SINUSES AND MASTOIDS: No acute abnormality. BONES AND SOFT TISSUES: Normal marrow signal. No acute soft tissue abnormality. IMPRESSION: 1. No acute intracranial abnormality. 2. Progressive moderate generalized atrophy and white matter disease bilaterally, extending into the thalami and brainstem. This likely reflects the sequelae of chronic microvascular ischemia. Electronically signed by: Lonni Necessary MD 09/05/2023 10:22 AM EDT RP Workstation: [id number]    Microbiology: Results for orders placed or performed in visit on 03/01/19  Novel Coronavirus, NAA ([facility])     Status: Abnormal   Collection Time: 03/01/19  1:08 PM   Specimen: Nasopharyngeal(NP) swabs in vial transport medium   NASOPHARYNGE  TESTING  Result Value Ref Range Status   SARS-CoV-2, NAA Detected (A) Not Detected Final    Comment: This nucleic acid amplification test was developed  and its performance characteristics determined by [employer]. Nucleic acid amplification tests include PCR and TMA. This test has not been FDA cleared or approved. This test has  been authorized by FDA under an Emergency Use Authorization (EUA). This test is only authorized for the duration of time the declaration that circumstances exist justifying the authorization of the emergency use of in vitro diagnostic tests for detection of SARS-CoV-2 virus and/or diagnosis of COVID-19 infection under section 564(b)(1) of the Act, 21 U.S.C. 639aaa-6(a) (1), unless the authorization is terminated or revoked sooner. When diagnostic testing is negative, the possibility of a false negative result should be considered in the context of a patient's recent exposures and the presence of clinical signs and symptoms consistent with COVID-19. An individual without symptoms of COVID-19 and who is not shedding SARS-CoV-2 virus would  expect to have a negative (not detected) result in this assay.     Labs: CBC: Recent Labs  Lab 09/05/23 0430 09/06/23 0246 09/07/23 0812 09/08/23 0222  WBC 8.7 8.8 8.6 9.5  HGB 11.6* 11.7* 11.9* 11.3*  HCT 35.3* 36.4 36.0 35.2*  MCV 95.4 95.8 94.2 95.7  PLT 282 259 255 255   Basic Metabolic Panel: Recent Labs  Lab 09/05/23 0430 09/06/23 0246 09/06/23 0940 09/07/23 1019  NA 139 139  --  140  K 4.0 3.7  --  4.4  CL 102 104  --  103  CO2 28 28  --  27  GLUCOSE 138* 125*  --  109*  BUN 21 17  --  10  CREATININE 0.68 0.79  --  0.63  CALCIUM  9.6 9.0  --  9.0  MG  --   --  1.8  --    Liver Function Tests: Recent Labs  Lab 09/05/23 0430 09/06/23 0246  AST 21 17  ALT 11 9  ALKPHOS 61 63  BILITOT 1.0 0.8  PROT 5.5* 5.3*  ALBUMIN 3.2* 3.0*   CBG: No results for input(s): GLUCAP in the last 168 hours.  Discharge time spent: {LESS THAN/GREATER UYJW:73611} 30 minutes.  Signed: Bernardino KATHEE Come, MD [facility] 09/08/2023

## 2023-09-09 ENCOUNTER — Telehealth: Payer: Self-pay | Admitting: Cardiology

## 2023-09-09 LAB — LIPOPROTEIN A (LPA): Lipoprotein (a): 45 nmol/L — ABNORMAL HIGH (ref <75.0)

## 2023-09-09 NOTE — Telephone Encounter (Signed)
 Spoke with pt regarding her vertigo. Pt stated she woke up this morning and found that she was having another bout of vertigo. Pt stated she is experiencing no other symptoms besides the dizziness. Pt stated that since calling us  this morning the vertigo has subsided but she does not have any medication to take if is comes on again. Pt was told this information would be sent to Dr. Jeffrie and his nurse for their suggestions. Pt verbalized understanding. All questions if any were answered.

## 2023-09-09 NOTE — Telephone Encounter (Signed)
 New Message:      Patient wants Dr Jeffrie to know That she is having problems wither Vertigo again. She also would like for him to please call in her the medicine that they had her on for Vertigo in the hospital.

## 2023-09-10 ENCOUNTER — Ambulatory Visit

## 2023-09-12 NOTE — Progress Notes (Deleted)
 Cardiology Office Note:    Date:  09/12/2023   ID:  Shelby Lin, DOB [birth year], MRN [id number]  PCP:  Claudene Pellet, MD  Cardiologist:  Oneil Parchment, MD { Click to update primary MD,subspecialty MD or APP then REFRESH:1}    Referring MD: Claudene Pellet, MD   Chief Complaint: hospital follow-up of NSTEMI  History of Present Illness:    Shelby Lin is a 83 y.o. 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 Parchment and presents today for hospital follow-up disease.   Prior cardiac catheterization in 2014 for anginal symptoms showed no angiographically significant CAD. Coronary calcifications have been noted on high-resolution chest CTs dating back to 2018. Myoview  in 05/2020 showed no evidence of ischemia.   Patient was recently admitted from 09/05/2023 to 09/08/2023  for vertigo after presenting with an episode of severe dizziness with diaphoresis and nausea/ vomiting. However, she was also found to have an elevated troponin on admission. EKG showed no acute ischemic changes. High-sensitivity troponin was elevated at 508 >> 542. Brain MRI showed no acute intracranial abnormalities but progressive moderate atrophy secondary to chronic microvascular disease. Echo showed LVEF of 45-50% with distal septal/ apical hypokinesis. LHC showed angiographically normal coronary arteries with minimal irregularities.   Patient presents today for follow-up. ***   NSTEMI Patient was recently admitted with vertigo/ Meniere's disease and was found to have an elevated troponin that peaked at 542. Echo showed LVEF of 45-50% with distal septal/ apical hypokinesis. LHC showed angiographically  normal coronary arteries with minimal irregularities.  - No chest pain. - Continue Aspirin  81mg  daily given coronary calcifications. - Continue Crestor  20mg  daily.  Chronic HFmrEF Echo during recent admission showed LVEF of 45-50% with distal septal/ apical hypokinesis, mild asymmetric LVH of the basal and septal segments, and grade 1 diastolic dysfunction.  - Euvolemic on exam. *** - Continue Lasix 20mg  daily as needed for edema.  - Continue Losartan 100mg  daily.  - Currently on HCTZ 25mg  daily. Will switch to Spironolactone 25mg  daily given slightly reduced EF. *** - SGLT2 inhibitor *** - Discussed importance of daily weights and sodium/ fluid restrictions.   Hypertension BP well controlled. *** - Continue medications for CHF as above.  Hyperlipidemia LDL 49 in 03/2020. LDL goal < 55.  - Continue Crestor  20mg  daily.  - Will repeat lipid panel and LFTs. ***  Type 2 Diabetes Mellitus Hemoglobin A1c 6.4% in 08/2023.  - Not on any medications.  - Management per PCP.   EKGs/Labs/Other Studies Reviewed:    The following studies were reviewed:  Echocardiogram 09/05/2023: Impressions: 1. Distal septal/apical hypokinesis . Left ventricular ejection fraction,  by estimation, is 45 to 50%. The left ventricle has mildly decreased  function. The left ventricle demonstrates regional wall motion  abnormalities (see scoring diagram/findings for   description). The left ventricular internal cavity size was mildly  dilated. There is mild asymmetric left ventricular hypertrophy of the  basal and septal segments. Left ventricular diastolic parameters are  consistent with Grade I diastolic dysfunction  (impaired relaxation).   2. Right ventricular systolic function is normal. The right ventricular  size is normal.   3. The mitral valve is abnormal. No evidence of mitral valve  regurgitation. No evidence  of mitral stenosis.   4. The aortic valve is tricuspid. There is mild calcification of  the  aortic valve. Aortic valve regurgitation is not visualized. Aortic valve  sclerosis is present, with no evidence of aortic valve stenosis.   5. The inferior vena cava is normal in size with greater than 50%  respiratory variability, suggesting right atrial pressure of 3 mmHg.  _______________  Left Cardiac Catheterization 09/07/2023: Dominance: Left  Angiographically normal coronary arteries with minimal irregularities. Left Dominant System Normal LVEDP    Recommendations:   Anticipated discharge date to be determined.   Evaluate for nonischemic etiology for elevated troponin   No indication for antiplatelet therapy at this time .   EKG:  EKG not ordered today.   Recent Labs: 09/06/2023: ALT 9; Magnesium 1.8 09/07/2023: BUN 10; Creatinine, Ser 0.63; Potassium 4.4; Sodium 140 09/08/2023: Hemoglobin 11.3; Platelets 255  Recent Lipid Panel    Component Value Date/Time   CHOL 134 08/31/2018 0919   TRIG 103 08/31/2018 0919   HDL 72 08/31/2018 0919   CHOLHDL 1.9 08/31/2018 0919   LDLCALC 41 08/31/2018 0919    Physical Exam:    Vital Signs: There were no vitals taken for this visit.    Wt Readings from Last 3 Encounters:  09/08/23 147 lb 6.4 oz (66.9 kg)  04/22/23 154 lb 6.4 oz (70 kg)  04/17/23 151 lb 3.2 oz (68.6 kg)     General: 83 y.o. female in no acute distress. HEENT: Normocephalic and atraumatic. Sclera clear.  Neck: Supple. No carotid bruits. No JVD. Heart: *** RRR. Distinct S1 and S2. No murmurs, gallops, or rubs.  Lungs: No increased work of breathing. Clear to ausculation bilaterally. No wheezes, rhonchi, or rales.  Abdomen: Soft, non-distended, and non-tender to palpation.  Extremities: No lower extremity edema.  Radial and distal pedal pulses 2+ and equal bilaterally. Skin: Warm and dry. Neuro: No focal deficits. Psych: Normal affect. Responds appropriately.   Assessment:    No diagnosis found.  Plan:     Disposition: Follow up in  ***   Signed, Aline FORBES Door, PA-C  09/12/2023 3:14 PM     [facility]

## 2023-09-15 DIAGNOSIS — E039 Hypothyroidism, unspecified: Secondary | ICD-10-CM | POA: Diagnosis not present

## 2023-09-15 DIAGNOSIS — R778 Other specified abnormalities of plasma proteins: Secondary | ICD-10-CM | POA: Diagnosis not present

## 2023-09-15 DIAGNOSIS — J449 Chronic obstructive pulmonary disease, unspecified: Secondary | ICD-10-CM | POA: Diagnosis not present

## 2023-09-21 NOTE — Progress Notes (Unsigned)
 Cardiology Office Note:  .   Date:  09/24/2023  ID:  Shelby Lin, DOB 08-15-1940, MRN [id number] PCP: Shelby Pellet, MD  [facility] Providers Cardiologist:  Shelby Parchment, MD {  History of Present Illness: .   Shelby Lin is a 83 y.o. female with history of normal coronaries on recent cardiac catheterization on 09/07/2023, chronic HFmrEF, hypertension, hyperlipidemia, type 2 diabetes mellitus, COPD on 2L of O2 at night, hypothyroidism, Meniere's disease, Raynaud's syndrome, fibromyalgia, and breast cancer     Normal coronaries  Normal heart catheterization 2014. Normal stress test 05/2020 Admitted 08/2023 for vertigo, elevated troponins 500s, left heart catheterization with normal coronaries.  Heart failure with mildly reduced EF First seen on echocardiogram 08/2023 EF 45 to 50% with RWMA, mild asymmetric LVH, no valvular disease.  Social history  Previous smoker of 20+ years      Patient with history of normal coronaries.  Last seen in office 11/2022 without any acute complaints.  Patient recently seen in the hospital for vertigo, had elevated troponins, 500s, with normal heart catheterization.  Echo obtained demonstrating mildly reduced EF with RWMA.  Brain MRI without any acute findings.  Today patient presents for hospital follow-up and accompanied with her husband.  She reported the day she was admitted she woke up with vertigo and was diaphoretic, sweaty but had no chest pain.  She continues to have vertigo today but has an appointment with ENT today as well.  Has denied any chest pain whatsoever, still ambulatory, at times walks with a cane but able to complete all of her ADLs.  No shortness of breath, orthopnea, peripheral edema.   ROS: Denies: Chest pain, shortness of breath, orthopnea, peripheral edema, palpitations, decreased exercise intolerance, fatigue, lightheadedness.   Studies Reviewed: .         Risk Assessment/Calculations:             Physical  Exam:   VS:  BP 110/70 (BP Location: Right Arm, Patient Position: Sitting, Cuff Size: Normal)   Pulse (!) 51   Ht 5' 4 (1.626 m)   Wt 145 lb 9.6 oz (66 kg)   SpO2 97%   BMI 24.99 kg/m    Wt Readings from Last 3 Encounters:  09/24/23 145 lb 9.6 oz (66 kg)  09/08/23 147 lb 6.4 oz (66.9 kg)  04/22/23 154 lb 6.4 oz (70 kg)    GEN: Well nourished, well developed in no acute distress NECK: No JVD; No carotid bruits CARDIAC: RRR, no murmurs, rubs, gallops RESPIRATORY:  Clear to auscultation without rales, wheezing or rhonchi  ABDOMEN: Soft, non-tender, non-distended EXTREMITIES:  No edema; No deformity   ASSESSMENT AND PLAN: .    [facility] Admitted for vertigo and had elevated troponins in the 500s. Echocardiogram with mildly reduced EF and wall motion abnormalities. LHC no evidence of CAD. Etiology is not entirely clear. Deferring cardiac MRI for further evaluation as this does not really change management and the cost of this largely outweighs its benefit. Continue aspirin , rosuvastatin  20 mg.  Chronic HFmrEF EF 45 to 50% with distal septal/apical hypokinesis, mild asymmetric LVH.  Overall asymptomatic, well compensated without any signs or symptoms of congestion With overall mildly reduced EF and well compensated heart failure being more conservative with therapy given cost and underlying issues of vertigo. GDMT: Start Jardiance  10 mg, continue losartan 100 mg, heart rate too low for beta-blocker.  In the future could consider replacing her HCTZ with spironolactone. Check BMP 2 weeks from today.  She also has as needed Lasix 20 mg but never has to use this. Would consider checking echocardiogram again in 6 to 12 months.  Hypertension Very well-controlled.  110/70 today Continue with HCTZ 25 mg.  Hyperlipidemia Excellent control.  LDL 43 February 2025  Continue with rosuvastatin  20 mg.  Type 2 diabetes A1c 6.4% July 2025, starting Jardiance  10 mg.  She has no issues with yeast  infections or UTIs.  Vertigo Following up with ENT today.  Brain MRI without any acute findings.  This continues to be persistent.  COPD Only on 2 L of oxygen  at night.  Quit smoking in 2022.  Dispo: 31-month follow-up with Dr. Jeffrie.  Signed, Shelby LITTIE Sluder, PA-C

## 2023-09-24 ENCOUNTER — Ambulatory Visit: Attending: Student | Admitting: Cardiology

## 2023-09-24 ENCOUNTER — Encounter: Payer: Self-pay | Admitting: Student

## 2023-09-24 VITALS — BP 110/70 | HR 51 | Ht 64.0 in | Wt 145.6 lb

## 2023-09-24 DIAGNOSIS — I5022 Chronic systolic (congestive) heart failure: Secondary | ICD-10-CM

## 2023-09-24 DIAGNOSIS — E1140 Type 2 diabetes mellitus with diabetic neuropathy, unspecified: Secondary | ICD-10-CM | POA: Diagnosis not present

## 2023-09-24 DIAGNOSIS — E7800 Pure hypercholesterolemia, unspecified: Secondary | ICD-10-CM | POA: Diagnosis not present

## 2023-09-24 DIAGNOSIS — E118 Type 2 diabetes mellitus with unspecified complications: Secondary | ICD-10-CM | POA: Diagnosis not present

## 2023-09-24 DIAGNOSIS — I2510 Atherosclerotic heart disease of native coronary artery without angina pectoris: Secondary | ICD-10-CM

## 2023-09-24 DIAGNOSIS — E782 Mixed hyperlipidemia: Secondary | ICD-10-CM

## 2023-09-24 DIAGNOSIS — I10 Essential (primary) hypertension: Secondary | ICD-10-CM

## 2023-09-24 DIAGNOSIS — H903 Sensorineural hearing loss, bilateral: Secondary | ICD-10-CM | POA: Diagnosis not present

## 2023-09-24 DIAGNOSIS — R42 Dizziness and giddiness: Secondary | ICD-10-CM | POA: Diagnosis not present

## 2023-09-24 DIAGNOSIS — E039 Hypothyroidism, unspecified: Secondary | ICD-10-CM | POA: Diagnosis not present

## 2023-09-24 MED ORDER — EMPAGLIFLOZIN 10 MG PO TABS: 10.0000 mg | ORAL_TABLET | Freq: Every day | ORAL | 3 refills | Status: DC

## 2023-09-24 NOTE — Patient Instructions (Signed)
 Medication Instructions:  Your physician has recommended you make the following change in your medication:   -Start Jardiance  10mg  once daily.  *If you need a refill on your cardiac medications before your next appointment, please call your pharmacy*  Lab Work: Your physician recommends that you return for lab work in: 2 weeks for BMET  If you have labs (blood work) drawn today and your tests are completely normal, you will receive your results only by: MyChart Message (if you have MyChart) OR A paper copy in the mail If you have any lab test that is abnormal or we need to change your treatment, we will call you to review the results.   Follow-Up: At [facility], you and your health needs are our priority.  As part of our continuing mission to provide you with exceptional heart care, our providers are all part of one team.  This team includes your primary Cardiologist (physician) and Advanced Practice Providers or APPs (Physician Assistants and Nurse Practitioners) who all work together to provide you with the care you need, when you need it.  Your next appointment:   6 month(s)  Provider:   Oneil Parchment, MD    We recommend signing up for the patient portal called MyChart.  Sign up information is provided on this After Visit Summary.  MyChart is used to connect with patients for Virtual Visits (Telemedicine).  Patients are able to view lab/test results, encounter notes, upcoming appointments, etc.  Non-urgent messages can be sent to your provider as well.   To learn more about what you can do with MyChart, go to [email or web address].

## 2023-09-25 ENCOUNTER — Ambulatory Visit: Admitting: Internal Medicine

## 2023-09-25 ENCOUNTER — Encounter: Payer: Self-pay | Admitting: Internal Medicine

## 2023-09-25 VITALS — BP 127/86 | HR 83 | Ht 64.0 in | Wt 146.0 lb

## 2023-09-25 DIAGNOSIS — R42 Dizziness and giddiness: Secondary | ICD-10-CM

## 2023-09-25 DIAGNOSIS — J449 Chronic obstructive pulmonary disease, unspecified: Secondary | ICD-10-CM | POA: Diagnosis not present

## 2023-09-25 DIAGNOSIS — Z122 Encounter for screening for malignant neoplasm of respiratory organs: Secondary | ICD-10-CM

## 2023-09-25 DIAGNOSIS — I519 Heart disease, unspecified: Secondary | ICD-10-CM

## 2023-09-25 NOTE — Progress Notes (Signed)
 IOV 02/11/2017  Chief Complaint  Patient presents with   Advice Only    Self referral for COPD. States she was dx by Dr. Alberta Lin x2 years ago. Has SOB with exertion and anxiety and has an occ. cough. Denies any CP.   83 year old female with limited smoking history and quit many years ago.  She tells me for the last few years she has had insidious onset of shortness of breath that is mild to moderate in severity brought on with exertion relieved by rest.  It is stable overall.  Does not much of associated cough except except very mild.  She does have associated ACE inhibitor intake.  The dyspnea is stable.  A few years ago based on pulmonary function test done?  At [facility] health location but I do not have the results patient was started on Spiriva.  There is a chest x-ray in the system from few to several years ago that shows hyperinflation personally visualized and confirmed the findings.  Few to several months ago was switched to incruse/.  She honestly does not know if the symptoms are helping her.  She denies any wheezing orthopnea proximal nocturnal dyspnea hemoptysis fever chills edema.  Some 6 weeks ago she describes what sounds like an asthma or COPD exacerbation treated with antibiotics and prednisone  x2 and then back to baseline currently.  At this point in time she wants pulmonary support for her diagnosis of COPD.  Her current symptoms are detailed below.  Blood work August 2018 normal hemoglobin A1c and TSH  OV 04/01/2017  Chief Complaint  Patient presents with   Follow-up    PFT done today and HRCT done 02/11/17.  Pt states she has been doing good.  No real complaints of cough other than acid reflux.   Follow-up investigation for COPD.  Since her last visit she had CT scan of the chest that does show bronchiectasis associated with emphysema.  Pulmonary function test today shows Gold stage II COPD but with significant bronchodilator response.  She feels good and stable.   Cough is very minimal.  Med review shows that she is just on anticholinergic.  She is not on inhaled corticosteroid or bronchodilator.  I noticed that she is on ACE inhibitor.  She tells me that she can cough significantly after a viral infection.  However at baseline she only has mild cough.  She room was having asthma as a child but she says she outgrew it.  Incidental finding of 5 mm right middle lobe nodule in December 2018 CT chest  New issue of her having to undergo upper endoscopy by Dr. Celestia at [facility].  She has had colonoscopy a few years ago under propofol  and she tolerated this fine.  She says gastroenterology is not aware of her COPD diagnosis and the severity of impairment of the lung function although she is quite functional and has tolerated sedation before.   OV 10/16/2017  Chief Complaint  Patient presents with   Follow-up    Pt states she has been having SOB due to the heat. Denies any cough or CP.    Follow-up Gold stage III COPD: Overall stable since last visit. In fact COPD cat score is improved significantly. It is only for an very minimal symptoms. This is on trouble inhaler therapy. She checked her inhaler technique with me and it looked good.She does she had a bad July because of the heat and humidity but she stated  endorse. She is grieving because she lost her little chihuahua at age 83 - Shelby Lin was his name     Alpha 1 - 129 and MM 02/18/18   Walking desaturation test on 02/11/2017 185 feet x 3 laps on ROOM AIR:  did not desaturate. Rest pulse ox was 96%, final pulse ox was 95%. HR response 99/min at rest to 105/min at peak exertion. Patient Shelby Lin  Did not Desaturate < 88% . Shelby Lin did not  Desaturated </= 3% points. Shelby Lin yes did get tachyardic   IMPRESSION: ct chest 2018 1. Minimal scattered cylindrical bronchiectasis with associated minimal tree-in-bud opacity in the medial upper lobes and medial right middle lobe. Findings could  be due to atypical mycobacterial infection (MAI) . 2. Mild-to-moderate centrilobular emphysema with mild diffuse bronchial wall thickening, compatible with the provided history of COPD. 3. Two small solid pulmonary nodules in the right lung, largest with average diameter 5 mm in the right middle lobe. No follow-up needed if patient is low-risk (and has no known or suspected primary neoplasm). Non-contrast chest CT can be considered in 12 months if patient is high-risk. This recommendation follows the consensus statement: Guidelines for Management of Incidental Pulmonary Nodules Detected on CT Images: From the Fleischner Society 2017; Radiology 2017; 284:228-243. 4. One vessel coronary atherosclerosis.   Aortic Atherosclerosis (ICD10-I70.0) and Emphysema (ICD10-J43.9).     Electronically Signed   By: Shelby Lin M.D.   On: 02/11/2017 16:30  OV 02/01/2018  Subjective:  [name], female , DOB: 1940-11-19 , age 83 y.o. , MRN: [id number] , ADDRESS: [address] [place] [address]   02/01/2018 -   Chief Complaint  Patient presents with   Follow-up    Pt states that her SOB has improved some, CT review   Follow-up Gold stage III COPD [MM phenotype] postbronchodilator FEV1 0.96 L / 46% with 26% bronchodilator response and DLCO 12.8/52% in February 2019.  CT scan of the chest with mild scattered bronchiectasis.  HPI Shelby Lin 83 y.o. -returns for follow-up of Gold stage III COPD.  She is taking triple inhaler therapy in the form of Incruse and Breo.  Overall her symptoms are stable and well-controlled.  COPD CAT score is 7.  Not much cough some amount of shortness of breath no chest pain at all.  Doing really well overall.  Up-to-date with her vaccines.  She does tell me that primary care physician thought her carbon dioxide level on chemistry was high.  Review of her chart shows, dioxide level December 2017 was normal.  She is asking for Incruse samples because  she is in the donut hole.  However he did not have them.  We talked about starting Trelegy in January 2020 and she is open and willing to look into this option.  Lung nodules: She had CT chest December 2019.  Follow-up from 1 year ago.  Nodules are diminished.  New finding of coronary artery calcification on CT scan December 2019: One-vessel coronary artery calcification seen.  No chest pains.  No prior stress test history.  She says she will talk about it with her primary care physician rather than take a cardiology referral right now.  IMPRESSION CT CHEST 1. Right middle lobe small solid pulmonary nodules are stable to decreased since 02/11/2017 chest CT, considered benign. No new significant pulmonary nodules. 2. Stable minimal scattered cylindrical bronchiectasis in medial upper lobes and medial right middle lobe. No evidence of  active pulmonary infection. 3. One vessel coronary atherosclerosis. 4. Small hiatal hernia.   Aortic Atherosclerosis (ICD10-I70.0) and Emphysema (ICD10-J43.9).     Electronically Signed   By: Shelby Lin M.D.   On: 01/28/2018 12:22  ROS - per HPI   OV 04/24/2020  Subjective:  [name], female , DOB: 1940-08-15 , age 57 y.o. , MRN: [id number] , ADDRESS: [address] [place] [address] PCP Claudene Pellet, MD Patient Care Team: Claudene Pellet, MD as PCP - General (Family Medicine) Jeffrie Oneil BROCKS, MD as PCP - Cardiology (Cardiology)  This Provider for this visit: Treatment Team:  Attending Provider: Tonna Redell SQUIBB, NP    04/24/2020 -   Chief Complaint  Patient presents with   Follow-up    Doing well   Stage III COPD MM phenotype  HPI Shelby Lin 83 y.o. -returns for follow-up.  I personally last saw her in December 2019.  After that she is followed up with nurse practitioners with the pandemic.  A year ago January 2020 when she had COVID that was mild.  She continues on Trelegy.  She is rarely used albuterol .  She uses  nighttime oxygen .  Her COPD CAT score is 4.  She is doing really well.  No chest pains no shortness of breath.  Last CT scan of the chest was in 2019.  Last PFT was also in 2019.  She does not smoke.  She is up-to-date with her vaccines.   CAT Score 04/24/2020 02/01/2018  Total CAT Score 4 7     OV 01/08/2021  Subjective:  [name], female , DOB: 1940-09-16 , age 23 y.o. , MRN: [id number] , ADDRESS: [address] [place] [address] PCP Claudene Pellet, MD Patient Care Team: Claudene Pellet, MD as PCP - General (Family Medicine) Jeffrie Oneil BROCKS, MD as PCP - Cardiology (Cardiology)  This Provider for this visit: Treatment Team:  Attending Provider: Geronimo Amel, MD    01/08/2021 -   Chief Complaint  Patient presents with   Follow-up    No new concerns, pft done today   Stage III COPD MM phenotype  HPI Shelby Lin 83 y.o. -presents for routine follow-up.  After seeing me last visit she did see nurse practitioner.  She had pulmonary function test today and the pulmonary function test is stable in the last 3 years.  She is up-to-date with her vaccination.  She is looking forward to having a great granddaughter in April 2023.  Her granddaughter is now 69 and has been married for 12 years and is finally been able to conceive.  The patient is cautiously optimistic and excited.  She did say that in July 2022 she fell down through her deck and bruised her right thigh and therefore she walks with a limp.  Other than that she is doing fine.  No respiratory exacerbations.  I did caution her about being vigilant about respiratory exacerbations particularly in the fall in the winter especially with human clustering.   CAT Score 04/24/2020 02/01/2018  Total CAT Score 4 7   IMPRESSION: 1. Right middle lobe small solid pulmonary nodules are stable to decreased since 02/11/2017 chest CT, considered benign. No new significant pulmonary nodules. 2. Stable minimal scattered  cylindrical bronchiectasis in medial upper lobes and medial right middle lobe. No evidence of active pulmonary infection. 3. One vessel coronary atherosclerosis. 4. Small hiatal hernia.   Aortic Atherosclerosis (ICD10-I70.0) and Emphysema (ICD10-J43.9).  Electronically Signed   By: Shelby Lin M.D.   On: 01/28/2018 12:22    02/26/2021: OV with Cobb NP for worsening cough and nasal congestion.  Her symptoms started on 12/24 and have persisted since.  She notes that her cough is productive with clear sputum production. She has had nasal congestion with clear rhinorrhea and sinus pressure/pain. She has noticed a mild increase in her shortness of breath with exertion as well. She was previously started on doxycyline for 7 days and prednisone  for 5. She did notice a slight improvement of her symptoms while on the prednisone  and still has 1 dose of doxy left. Extended doxycycline  to complete 10 day course and prednisone  taper.   12/30/2021: OV with Cobb NP for acute visit.  On Saturday, she woke up and noticed that she had increased cough.  Since then she started having some more chest congestion and wheezing.  Yesterday, she started producing phlegm.  Describes it as yellow in color.  She has had a few sick exposures at her church, with confirmed cases of COVID.  She did not do any viral testing at home.  No significant URI symptoms.  Denies any fevers, chills, hemoptysis, increased shortness of breath.  She uses Trelegy every day.  Has not had to use albuterol .  Not using anything over-the-counter for cough.  Does have a flutter valve at home but has not restarted this yet. Treated for AECOPD/bronchiectatic flare with empiric doxycycline  and prednisone  burst. CXR clear.   01/13/2022: Today - follow up Patient presents today for follow up after being treated for AECOPD. She did have some sick exposures with confirmed COVID cases prior to becoming ill. Viral testing was unavailable in office so she  was advised to test at home, which was negative. She did not complete the doxycycline  because it gave her very severe indigestion, which resolved after she stopped it. She did complete the prednisone  burst and feels much better. Her chest congestion has resolved and she hasn't noticed much coughing. Feels like her breathing is back to her baseline. She continues on Trelegy daily. Using flutter valve at night.    OV 05/22/2022  Subjective:  [name], female , DOB: 09/25/1940 , age 48 y.o. , MRN: [id number] , ADDRESS: [address] [place] [address] PCP Claudene Pellet, MD Patient Care Team: Claudene Pellet, MD as PCP - General (Family Medicine) Jeffrie Oneil BROCKS, MD as PCP - Cardiology (Cardiology)  This Provider for this visit: Treatment Team:  Attending Provider: Geronimo Amel, MD    05/22/2022 -   Chief Complaint  Patient presents with   Follow-up    F/up      HPI Shelby Lin 83 y.o. -Gold stage III COPD patient returning for follow-up.  I personally not seen her in a year and a half.  She states she spent 2023 without any exacerbations and she continued Trelegy.  She enjoyed the birth of her great grandchild in April 2023.  However in 2024 she started getting COPD exacerbation.  She saw a Publishing rights manager.  She started having some GI upset with prednisone  but not doxycycline .  Thought she told me but she told the nurse practitioner the opposite.  In any event she says she can always take doxycycline  if needed.  Currently not on doxycycline  currently on Trelegy.  The only other health issues that she is fallen 7 times in the last 18 months and she occasionally now needs a cane.  She has been  attending physical therapy since October 2023 and has not fallen since.  She does have some residual shortness of breath with exertion.  I did talk to her about the American Cancer Society lung cancer screening guidelines that indicate CT scan to 83 years of age.  Her last  CT scan was in 2019.  She technically does not meet criteria for lung cancer screening but she does have some shortness of breath.  She is willing to have a CT.  We talked about RSV vaccine counseling.  She will have it commercially.  Did review the labs and she does have some baseline eosinophils but she is only had 1 course prednisone  in the last 14 months.      OV 07/24/2022  Subjective:  [name], female , DOB: 1940/12/11 , age 41 y.o. , MRN: [id number] , ADDRESS: [address] [place] [address] PCP Claudene Pellet, MD Patient Care Team: Claudene Pellet, MD as PCP - General (Family Medicine) Jeffrie Oneil BROCKS, MD as PCP - Cardiology (Cardiology)  This Provider for this visit: Treatment Team:  Attending Provider: Geronimo Amel, MD  Stage III COPD MM phenotype  07/24/2022 -   Chief Complaint  Patient presents with   Acute Visit    Cough not productive now but was and mucus was yellow     HPI Shelby Lin 82 y.o. -this is an acute visit.  She says on Saturday before Memorial Day Monday she started having a cough.  She thinks the pollen is worse this year.  No previous known allergies to seasons.  No sick contacts.  Then Sunday before Memorial Day things got worse with yellow mucus and wheezing.  No fever.  Monday things were even worse.  She then on Tuesday, 07/23/2022 called and acutely.  Dr. Ozell Brought gave her azithromycin  and prednisone .  Today second day of prednisone  and azithromycin .  She is beginning to feel better but still having a cough.  She is taking Delsym.  She not sure about the cause of the current exacerbations.  Previously she has had high eosinophils but no RAST allergy panel on review of labs.  She also reports that she never got results of the CT on 06/15/2022.  Review the records indicate she got the results on 06/23/2022.  I shared the results again.   Of note she continues to have ongoing chronic headaches.  She has seen ENT.  She is  known to have left-sided Mnire's.  She has had right-sided otalgia.  Temporal mandibular joint arthritis has been suspected.  And referral to her dentist has been made.          Latest Reference Range & Units 01/17/13 11:13 09/26/14 00:01 02/28/18 17:32  Eosinophils Absolute 0.0 - 0.5 K/uL 0.2 0.3 0.3   OV 03/09/2023  Subjective:  [name], female , DOB: 1940-08-16 , age 44 y.o. , MRN: [id number] , ADDRESS: [address] [place] [address] PCP Claudene Pellet, MD Patient Care Team: Claudene Pellet, MD as PCP - General (Family Medicine) Jeffrie Oneil BROCKS, MD as PCP - Cardiology (Cardiology)  This Provider for this visit: Treatment Team:  Attending Provider: Geronimo Amel, MD    03/09/2023 -   Chief Complaint  Patient presents with   Follow-up    Pt states she has been having left side back pain. Using inhalers, no other concerns      HPI Shelby Lin 83 y.o. -advanced COPD patient presents for follow-up she  is on baseline nighttime oxygen .  She has new onset exercise hypoxemia compared to March 2024 during a sit/stand test today.  However she is not feeling it.  She tells me that she had a good Christmas and started the New Year's season quite well she feels the COPD self is stable.  She did have repeated flareups in the early part of 2020 for at least 3 but since June 2024 no further exacerbations no hospitalizations at all no emergency room visits.  Her main issue right now with some constipation for which she is going to start pelvic floor exercises but she says she is also suffering from new onset left lower /infrascapular pain.  This pain is getting worse.  On and off.  When it is severe it feels like it is grabbing and poking her.  It does not wake her up.  Sometimes it is mild on average its moderate this no associated diaphoresis.  Her last CT scan of the chest was in April 2024 and was reassuring.  However the symptoms are new.  Of note because of  her recurrent COPD exacerbation I told her to get a IgE and CBC with differential last time but this has not been done she is willing to get that done at this time.    OV 09/25/2023  Subjective:  [name], female , DOB: 03-May-1940 , age 13 y.o. , MRN: [id number] , ADDRESS: [address] [place] [address] PCP Claudene Pellet, MD Patient Care Team: Claudene Pellet, MD as PCP - General (Family Medicine) Jeffrie Oneil BROCKS, MD as PCP - Cardiology (Cardiology)  This Provider for this visit: Treatment Team:  Attending Provider: Geronimo Amel, MD    09/25/2023 -   Chief Complaint  Patient presents with   Follow-up    COPD Pt states she is doing okay but had COVID in June. Pt was in the hospital for 5 days in July. She complains of tight chest pain in the middle of her chest. and vertigo.      HPI Shelby Lin 83 y.o. -returns for follow-up.  Last early part of the year.  After that she seen expectation about in June 2020 for she had COVID and then in July 2025 she got admitted for dizziness and vertigo and I reviewed the chart from this hospitalization she was there for 5 days but she also ended up getting this diagnosed with chronic systolic dysfunction new diagnosis.  Along with elevated troponins that was considered myocardial infarction not due to obstructive coronary artery disease [MINOCA).  She is currently close to baseline.  She did have a CT scan of the chest in July 2025 that did not have any lung cancer but had less than 3 mm nodules.  She no longer needs lung cancer screening.  Even though she is better her vertigo still continues.  She has an upcoming appointment with ear nose throat    Simple office walk 185 feet x  3 laps goal with forehead probe 05/22/2022  03/09/2023   O2 used ra ra room air  Number laps completed 3 Sit stand x 15  Comments about pace Noraml. No cane   Resting Pulse Ox/HR 97% and 86/min And 94% with a heart rate of 63  Final  Pulse Ox/HR 98% and 109/min 84% with a heart rate of 62  Desaturated </= 88% no   Desaturated <= 3% points no   Got Tachycardic >/= 90/min yes   Symptoms at end  of test none   Miscellaneous comments x New onset exercise hypoxemia   PFT     Latest Ref Rng & Units 01/08/2021   10:46 AM 04/01/2017    9:41 AM  PFT Results  FVC-Pre L 1.93  1.69   FVC-Predicted Pre % 73  61   FVC-Post L  2.02   FVC-Predicted Post %  73   Pre FEV1/FVC % % 49  45   Post FEV1/FCV % %  48   FEV1-Pre L 0.95  0.76   FEV1-Predicted Pre % 48  36   FEV1-Post L  0.96   DLCO uncorrected ml/min/mmHg 10.82  12.81   DLCO UNC% % 57  52   DLCO corrected ml/min/mmHg 10.82    DLCO COR %Predicted % 57    DLVA Predicted % 88  84        LAB RESULTS last 96 hours No results found.       has a past medical history of Anxiety, Arthritis, Asthma in child, Atherosclerotic heart disease of native coronary artery without angina pectoris, Cataracts, bilateral, Combined hyperlipidemia, Constipation, unspecified, COPD (chronic obstructive pulmonary disease) (HCC), Coronary artery spasm (HCC), Diabetic neuropathy (HCC), DM (diabetes mellitus) with complications (HCC), Dupuytren's contracture, Dyspepsia, Familial tremor, Fatigue, Fibromyalgia syndrome, GERD (gastroesophageal reflux disease), Hashimoto's thyroiditis, Hiatal hernia, History of colon polyps, Hashimoto thyroiditis, Hypercholesteremia, Hypertension, Hypothyroidism, acquired, autoimmune, IBS (irritable bowel syndrome), Meniere's disease of left ear, Migraine without aura and without status migrainosus, not intractable, Multinodular goiter (nontoxic), Nonexudative macular degeneration, Raynaud's syndrome without gangrene, Situational stress, Tachycardia, Thyroiditis, autoimmune, Ulcerative colitis, unspecified, without complications (HCC), Vitamin B 12 deficiency, Vitamin D  deficiency, and Wears glasses.   reports that she quit smoking about 22 years ago. Her smoking use  included cigarettes. She started smoking about 64 years ago. She has a 21 pack-year smoking history. She has never used smokeless tobacco.  Past Surgical History:  Procedure Laterality Date   ANAL RECTAL MANOMETRY Bilateral 11/26/2022   Procedure: ANO RECTAL MANOMETRY;  Surgeon: Burnette Fallow, MD;  Location: [facility];  Service: Gastroenterology;  Laterality: Bilateral;   BREAST EXCISIONAL BIOPSY Left    benign   BREAST EXCISIONAL BIOPSY Right    milk gland removed   BREAST LUMPECTOMY Left 1990   benign   BREAST LUMPECTOMY WITH NEEDLE LOCALIZATION Right 02/28/2013   Procedure: RIGHT BREAST NEEDLE LOCALIZATION LUMPECTOMY ;  Surgeon: Morene ONEIDA Olives, MD;  Location: [facility];  Service: General;  Laterality: Right;   CHOLECYSTECTOMY  2008   lapcholi   DILATION AND CURETTAGE OF UTERUS     LEFT HEART CATH AND CORONARY ANGIOGRAPHY N/A 09/07/2023   Procedure: LEFT HEART CATH AND CORONARY ANGIOGRAPHY;  Surgeon: Anner Alm ORN, MD;  Location: [facility] INVASIVE CV LAB;  Service: Cardiovascular;  Laterality: N/A;   LEFT HEART CATHETERIZATION WITH CORONARY ANGIOGRAM N/A 01/26/2013   Procedure: LEFT HEART CATHETERIZATION WITH CORONARY ANGIOGRAM;  Surgeon: Oneil Parchment, MD;  Location: [facility] CATH LAB;  Service: Cardiovascular;  Laterality: N/A;   PARTIAL HYSTERECTOMY  1971   TONSILLECTOMY      Allergies  Allergen Reactions   Acyclovir And Related Other (See Comments)    Unknown    Allegra [Fexofenadine] Hives   Amoxicillin Hives   Codeine Nausea And Vomiting   Elemental Sulfur Hives   Enalapril Maleate Cough   Hydrocodone  Bit-Homatrop Mbr Other (See Comments)    Unknown    Hydrocodone -Acetaminophen  Other (See Comments)    Unknown    Keflex [Cephalexin] Hives  Levofloxacin Other (See Comments)    insomnia   Lorabid [Loracarbef] Hives   Misc. Sulfonamide Containing Compounds Hives   Sulfa Antibiotics Hives   Sulfur Hives    Immunization History  Administered Date(s)  Administered   Fluzone Influenza virus vaccine,trivalent (IIV3), split virus 01/17/2015, 01/08/2016, 11/27/2017, 12/14/2018   Influenza Split 11/30/2007   Influenza, High Dose Seasonal PF 12/01/2012, 12/05/2013, 12/12/2016, 11/27/2017, 12/14/2018, 01/01/2021   Influenza-Unspecified 12/18/2011, 02/16/2023   PFIZER(Purple Top)SARS-COV-2 Vaccination 05/05/2019, 05/26/2019, 12/12/2019   PNEUMOCOCCAL CONJUGATE-20 04/21/2023   Pneumococcal Conjugate-13 07/18/2015, 10/16/2017   Pneumococcal Polysaccharide-23 10/23/2008, 10/20/2018   Td 07/13/2007   Tdap 07/27/2017   Zoster, Live 05/04/2014    Family History  Problem Relation Age of Onset   Breast cancer Mother 75   Hypertension Mother    Stroke Mother    Heart attack Father    Sudden death Father    Hypertension Sister    Diabetes Sister    Stroke Sister    Breast cancer Paternal Aunt 50 - 69   Ovarian cancer Paternal Aunt 56 - 69   Thyroid  disease Neg Hx      Current Outpatient Medications:    albuterol  (VENTOLIN  HFA) 108 (90 Base) MCG/ACT inhaler, Inhale 1-2 puffs into the lungs every 6 (six) hours as needed for wheezing or shortness of breath., Disp: 18 g, Rfl: 6   aspirin  EC 81 MG tablet, Take 1 tablet (81 mg total) by mouth daily., Disp: , Rfl:    Cyanocobalamin  (VITAMIN B 12 PO), Take by mouth daily., Disp: , Rfl:    DULoxetine  (CYMBALTA ) 30 MG capsule, Take 30 mg by mouth 2 (two) times daily., Disp: , Rfl:    furosemide (LASIX) 20 MG tablet, Take 20 mg by mouth as needed., Disp: , Rfl:    hydrochlorothiazide (HYDRODIURIL) 25 MG tablet, TAKE 1 TABLET BY MOUTH EVERY DAY IN THE MORNING, Disp: , Rfl: 1   LAGEVRIO 200 MG CAPS capsule, Take 4 capsules by mouth 2 (two) times daily., Disp: , Rfl:    LORazepam  (ATIVAN ) 0.5 MG tablet, Take 0.25 mg by mouth See admin instructions. Take 1/2 tablet by mouth 2-3 times a day as needed for anxiety., Disp: , Rfl:    losartan (COZAAR) 100 MG tablet, Take 100 mg by mouth daily., Disp: , Rfl: 1    Multiple Vitamin (MULTIVITAMIN) tablet, Take 1 tablet by mouth daily., Disp: , Rfl:    omeprazole (PRILOSEC) 20 MG capsule, Take 20 mg by mouth daily., Disp: , Rfl:    rosuvastatin  (CRESTOR ) 20 MG tablet, Take 1 tablet (20 mg total) by mouth daily., Disp: 90 tablet, Rfl: 3   SYNTHROID  88 MCG tablet, TAKE DAILY BEFORE BREAKFAST. 1 TABLET 6 DAYS EACH WEEK, BUT TAKE ONLY 1/2 TABLET ON THE 7TH DAY, Disp: 30 tablet, Rfl: 5   TRELEGY ELLIPTA  100-62.5-25 MCG/ACT AEPB, INHALE 1 PUFF BY MOUTH EVERY DAY, Disp: 60 each, Rfl: 11   VITAMIN D , CHOLECALCIFEROL, PO, Take 50,000 Units by mouth once a week. On Wednesdays, Disp: , Rfl:    empagliflozin  (JARDIANCE ) 10 MG TABS tablet, Take 1 tablet (10 mg total) by mouth daily before breakfast. (Patient not taking: Reported on 09/25/2023), Disp: 90 tablet, Rfl: 3      Objective:   Vitals:   09/25/23 1101  BP: 127/86  Pulse: 83  SpO2: 97%  Weight: 146 lb (66.2 kg)  Height: 5' 4 (1.626 m)    Estimated body mass index is 25.06 kg/m as calculated from the following:  Height as of this encounter: 5' 4 (1.626 m).   Weight as of this encounter: 146 lb (66.2 kg).  @WEIGHTCHANGE @  Filed Weights   09/25/23 1101  Weight: 146 lb (66.2 kg)     Physical Exam   General: No distress. Looks a bit deconditioned O2 at rest: no Cane present: YES Sitting in wheel chair: no Frail: no Obese: no Neuro: Alert and Oriented x 3. GCS 15. Speech normal Psych: Pleasant Resp:  Barrel Chest - no.  Wheeze - no, Crackles - no, No overt respiratory distress CVS: Normal heart sounds. Murmurs - no Ext: Stigmata of Connective Tissue Disease - no HEENT: Normal upper airway. PEERL +. No post nasal drip        Assessment/     Assessment & Plan Stage 3 severe COPD by GOLD classification (HCC)  Screening for lung cancer  Vertigo  Chronic systolic dysfunction of left ventricle    PLAN Patient Instructions     ICD-10-CM   1. Stage 3 severe COPD by GOLD  classification (HCC)  J44.9     2. Screening for lung cancer  Z12.2     3. Vertigo  R42     4. Chronic systolic dysfunction of left ventricle  I51.9       Stage 3 severe COPD by GOLD classification (HCC)  - Stable  Plan  - continue trelegy scheduled - continue albuterol  as needed - continue night o2  Screening for lung cancer  - no cancer on CT chest July 2025 - very small nodules < 3mm only on CT uJuly 2025  Plan  - no futher CT chest unless clinically indicated  Vertigo- admit July 2025  Plan  - per PCP and ENT  Chronic systolic dysfunction of left ventricle - new diagnosis July 2025 following [facility]  Plan  - per cardiology   FOLLOWUP 6 months with APP    FOLLOWUP    Return in about 6 months (around 03/27/2024) for with any of the APPS.    SIGNATURE    Dr. Dorethia Cave, M.D., F.C.C.P,  Pulmonary and Critical Care Medicine Staff Physician, [facility] Director - Interstitial Lung Disease  Program  Pulmonary Fibrosis [facility] at [facility] [place], [place], [address]  Pager: [phone], If no answer or between  15:00h - 7:00h: call [phone] Telephone: [phone]  10:51 PM 09/25/2023

## 2023-09-25 NOTE — Patient Instructions (Addendum)
 ICD-10-CM   1. Stage 3 severe COPD by GOLD classification (HCC)  J44.9     2. Screening for lung cancer  Z12.2     3. Vertigo  R42     4. Chronic systolic dysfunction of left ventricle  I51.9       Stage 3 severe COPD by GOLD classification (HCC)  - Stable  Plan  - continue trelegy scheduled - continue albuterol  as needed - continue night o2  Screening for lung cancer  - no cancer on CT chest July 2025 - very small nodules < 3mm only on CT uJuly 2025  Plan  - no futher CT chest unless clinically indicated  Vertigo- admit July 2025  Plan  - per PCP and ENT  Chronic systolic dysfunction of left ventricle - new diagnosis July 2025 following [facility]  Plan  - per cardiology   FOLLOWUP 6 months with APP

## 2023-09-28 ENCOUNTER — Telehealth: Payer: Self-pay | Admitting: Cardiology

## 2023-09-28 DIAGNOSIS — I959 Hypotension, unspecified: Secondary | ICD-10-CM | POA: Diagnosis not present

## 2023-09-28 DIAGNOSIS — E118 Type 2 diabetes mellitus with unspecified complications: Secondary | ICD-10-CM | POA: Diagnosis not present

## 2023-09-28 DIAGNOSIS — R55 Syncope and collapse: Secondary | ICD-10-CM | POA: Diagnosis not present

## 2023-09-28 NOTE — Telephone Encounter (Signed)
 Dr. Lauran Sharps asked if Dr. Jeffrie can call back to discuss this pt. She states it is not urgent.

## 2023-09-28 NOTE — Telephone Encounter (Signed)
 Was seen by Thom Sluder, PA 09/24/23

## 2023-09-28 NOTE — Telephone Encounter (Signed)
 Patient was seen by Ronalee Sluder, PA on 09/24/23. An Urgent referral was sent for the patient to be seen by Dr. Jeffrie. Patient is unsure if her issue is from vertigo or the heart. Patient is requesting to speak with Dr. Jeffrie nurse. Please advise.

## 2023-09-28 NOTE — Telephone Encounter (Signed)
 Patient saw her PCP today who would like to talk with Dr. Jeffrie. There is another phone note in regards to this. Patient states that she is not getting any better since her office visit last week. She saw her PCP today who is concerned that her symptoms are cardiac related.

## 2023-09-29 ENCOUNTER — Telehealth: Payer: Self-pay | Admitting: Cardiology

## 2023-09-29 NOTE — Telephone Encounter (Signed)
 Called patient back about message. Patient stated she feels weak, and did not take her BP medication today, but to low BP at PCP office yesterday. BP 75/40. Patient stated her BP today was 148/55 and 132/72. Patient is currently on hydrochlorothiazide 25 mg daily and losartan 100 mg daily. Patient thinks her losartan is too much. Will send message to Dr. Jeffrie for advisement. Patient did not have any HR to report.

## 2023-09-29 NOTE — Telephone Encounter (Signed)
 Dr. Claudene requesting a c/b from Dr. Jeffrie after 4pm today.

## 2023-09-29 NOTE — Telephone Encounter (Signed)
 Routing to Dr. Jeffrie and his nurse.

## 2023-09-29 NOTE — Telephone Encounter (Signed)
 Pt c/o BP issue: STAT if pt c/o blurred vision, one-sided weakness or slurred speech.  STAT if BP is GREATER than 180/120 TODAY.  STAT if BP is LESS than 90/60 and SYMPTOMATIC TODAY  1. What is your BP concern? BP is fluctuating.   2. Have you taken any BP medication today?she is on BP medication, but she didn't take it today.   3. What are your last 5 BP readings? 8/4 101/46        113/63 8/5 1pm 132/64  4. Are you having any other symptoms (ex. Dizziness, headache, blurred vision, passed out)? Patient feels weak.

## 2023-09-30 NOTE — Telephone Encounter (Signed)
 Pt is scheduled with Dr Jeffrie 10/01/23 for further evaluation.

## 2023-09-30 NOTE — Telephone Encounter (Signed)
 Dr Jeffrie is aware of this telephone message.  Pt is scheduled to see him 10/01/23 for further evaluation.

## 2023-09-30 NOTE — Telephone Encounter (Signed)
 Dr Jeffrie is aware of this phone message.  Please see the following telephone note with same information included.  Pt is scheduled to see him for further evaluation 10/01/2023.

## 2023-10-01 ENCOUNTER — Ambulatory Visit: Attending: Cardiology | Admitting: Cardiology

## 2023-10-01 ENCOUNTER — Encounter: Payer: Self-pay | Admitting: Cardiology

## 2023-10-01 VITALS — BP 80/52 | HR 95 | Ht 64.0 in | Wt 145.0 lb

## 2023-10-01 DIAGNOSIS — I5022 Chronic systolic (congestive) heart failure: Secondary | ICD-10-CM | POA: Diagnosis not present

## 2023-10-01 DIAGNOSIS — I2510 Atherosclerotic heart disease of native coronary artery without angina pectoris: Secondary | ICD-10-CM | POA: Diagnosis not present

## 2023-10-01 NOTE — Telephone Encounter (Signed)
 Pt seen and evaluated in office 10/01/23 by Dr Jeffrie.  Please see his note for further documentation.

## 2023-10-01 NOTE — Patient Instructions (Signed)
 Medication Instructions:  Please discontinue  your Losartan and Hydrochlorothiazide Continue all other medications as listed.  *If you need a refill on your cardiac medications before your next appointment, please call your pharmacy*  Please keep a log of your blood pressures over the next 2 weeks and send them to Dr Jeffrie for his review.  Follow-Up: At [facility], you and your health needs are our priority.  As part of our continuing mission to provide you with exceptional heart care, our providers are all part of one team.  This team includes your primary Cardiologist (physician) and Advanced Practice Providers or APPs (Physician Assistants and Nurse Practitioners) who all work together to provide you with the care you need, when you need it.  Your next appointment:   Follow up to be determined.   We recommend signing up for the patient portal called MyChart.  Sign up information is provided on this After Visit Summary.  MyChart is used to connect with patients for Virtual Visits (Telemedicine).  Patients are able to view lab/test results, encounter notes, upcoming appointments, etc.  Non-urgent messages can be sent to your provider as well.   To learn more about what you can do with MyChart, go to [email or web address].

## 2023-10-01 NOTE — Progress Notes (Signed)
 Cardiology Office Note:  .   Date:  10/01/2023  ID:  Shelby Lin, DOB 14-Aug-1940, MRN [id number] PCP: Claudene Pellet, MD  [facility] Providers Cardiologist:  Oneil Parchment, MD     History of Present Illness: .   Shelby Lin is a 83 y.o. female Discussed the use of AI scribe software for clinical note transcription with the patient, who gave verbal consent to proceed.  History of Present Illness Shelby Lin is an 83 year old female with chronic systolic heart failure and COPD who presents with low blood pressure. She is accompanied by a family member, [name].  She has experienced low blood pressure for a couple of days, with readings as low as 75/40 mmHg at her primary care physician's office and 80/52 mmHg today. Previously, her blood pressure was 110/70 mmHg on July 31st. She feels 'washed out' and 'terrible' due to the low blood pressure, with a desire to sleep frequently. She also notes experiencing dizziness, though not as severe as before. No shaking, chills, fevers, diarrhea, or inability to take food down.  She has a history of chronic systolic heart failure with an ejection fraction of 45-50%. She recently underwent a heart catheterization which was normal, despite elevated troponins in the 500 range. She also had a brain MRI with no acute findings and has been experiencing vertigo for the past five weeks, although it has improved slightly. She describes the vertigo as 'flipping and flopping' and 'the whole room is getting out of control.'  Her current medications include losartan 100 mg daily and hydrochlorothiazide 25 mg daily. She recalls being taken off an ACE inhibitor in the past due to shortness of breath, likely related to her COPD. She has not taken Jardiance , despite it being mentioned previously.  Her past medical history includes COPD, for which she uses 2 liters of oxygen  at night. She also mentions a history of COVID-19 infection two weeks prior to her  myocardial injury, which she associates with her current cardiac issues.  Her father died at 15 years old, reportedly from heart-related issues, and her mother mentioned he had lots of indigestion.    Studies Reviewed: .        Results LABS Troponin: 500 A1c: 6.4 (08/2023)  RADIOLOGY Brain MRI: No acute findings  DIAGNOSTIC Heart catheterization: Normal Echocardiogram: Ejection Fraction 45-50% Risk Assessment/Calculations:            Physical Exam:   VS:  BP (!) 80/52   Pulse 95   Ht 5' 4 (1.626 m)   Wt 145 lb (65.8 kg)   SpO2 94%   BMI 24.89 kg/m    Wt Readings from Last 3 Encounters:  10/01/23 145 lb (65.8 kg)  09/25/23 146 lb (66.2 kg)  09/24/23 145 lb 9.6 oz (66 kg)    GEN: Well nourished, well developed in no acute distress NECK: No JVD; No carotid bruits CARDIAC: RRR, no murmurs, no rubs, no gallops RESPIRATORY:  Clear to auscultation without rales, wheezing or rhonchi  ABDOMEN: Soft, non-tender, non-distended EXTREMITIES:  No edema; No deformity   ASSESSMENT AND PLAN: .    Assessment and Plan Assessment & Plan Hypotension Blood pressure is low, recorded at 75/40 mmHg at PCP's office and 80/52 mmHg today, likely secondary to antihypertensive medications (losartan and HCTZ). - Discontinue losartan 100 mg daily. - Discontinue hydrochlorothiazide 25 mg daily. - Instruct to monitor blood pressure at home. - Advise to report blood pressure readings in 1-2 weeks or sooner  if blood pressure is consistently high.  Chronic systolic heart failure with mildly reduced ejection fraction Ejection fraction is mildly reduced at 45-50%. The patient is stable without evidence of acute decompensation. Discussed medication management, including potential use of Jardiance  for heart failure benefits, but not initiated due to patient preference. - Monitor heart failure symptoms and adjust medications as needed based on blood pressure and clinical status.  Type 2 myocardial  infarction secondary to COVID-19 Myocardial injury likely secondary to stress from recent COVID-19 infection. Troponin level was 500, indicating mild myocardial injury without evidence of obstructive coronary artery disease. Explained as a type 2 myocardial infarction due to stress on the heart rather than a blockage. - Continue monitoring cardiac status and manage contributing factors such as blood pressure and heart failure.  Dizziness and vertigo Ongoing dizziness and vertigo, less severe than previously. No acute findings on brain MRI. Symptoms may be related to hypotension and recent myocardial injury. - Monitor symptoms and reassess if she worsens or persists.         Signed, Oneil Parchment, MD

## 2023-10-14 ENCOUNTER — Telehealth: Payer: Self-pay | Admitting: Cardiology

## 2023-10-14 NOTE — Telephone Encounter (Signed)
 Patient reports BP readings and update since discontinuing losartan and hydrochlorothiazide (see BP readings in message below).  Patient states her vertigo is gone since discontinuing these medications and she is feeling  much better. She reports she does have some swelling in her ankles that improves with elevation. She would like to know if Dr. Jeffrie wants her to restart hydrochlorothiazide.  Patient is going on a 3-day beach trip at the end of next week.  Reviewed with patient elevating feet, monitoring salt intake and using Lasix PRN. Will forward message to Dr. Jeffrie to review and advise. Informed patient he is out of the office until next week and may not respond until early next week. Patient verbalized understanding and expressed appreciation for call.

## 2023-10-14 NOTE — Telephone Encounter (Signed)
 Pt calling with BP results as requested. Pt would like a c/b from nurse regarding results as well as she has questions about other issues. Please advise  All readings are from right arm:  8/8 - 129/70 83 8/9 - 130/72 86 8/10 - 103/65 49 8/11 - 130/67 49 8/12 - 142/66 87 8/13 - 131/89 62 8/14 - 144/84 49 8/15 - 103/79 85 8/16 - 123/72 81 8/17 - 119/58 59 8/18 - 104/82 80 8/20 - 128/69 85

## 2023-10-19 NOTE — Telephone Encounter (Signed)
 Jeffrie Oneil BROCKS, MD to Me  Theotis Sharlet PARAS, RN     10/17/23  7:02 AM I am fine with her staying off of the hydrochlorothiazide and losartan. Reviewed BP's Oneil Jeffrie, MD  Pt is aware of the above information.  No further questions at the time of the call.

## 2023-10-25 DIAGNOSIS — E118 Type 2 diabetes mellitus with unspecified complications: Secondary | ICD-10-CM | POA: Diagnosis not present

## 2023-10-25 DIAGNOSIS — E7800 Pure hypercholesterolemia, unspecified: Secondary | ICD-10-CM | POA: Diagnosis not present

## 2023-10-25 DIAGNOSIS — E039 Hypothyroidism, unspecified: Secondary | ICD-10-CM | POA: Diagnosis not present

## 2023-10-25 DIAGNOSIS — E1140 Type 2 diabetes mellitus with diabetic neuropathy, unspecified: Secondary | ICD-10-CM | POA: Diagnosis not present

## 2023-10-27 ENCOUNTER — Ambulatory Visit: Admitting: Internal Medicine

## 2023-10-29 DIAGNOSIS — E042 Nontoxic multinodular goiter: Secondary | ICD-10-CM | POA: Diagnosis not present

## 2023-10-29 DIAGNOSIS — E1169 Type 2 diabetes mellitus with other specified complication: Secondary | ICD-10-CM | POA: Diagnosis not present

## 2023-10-29 DIAGNOSIS — M797 Fibromyalgia: Secondary | ICD-10-CM | POA: Diagnosis not present

## 2023-10-29 DIAGNOSIS — J449 Chronic obstructive pulmonary disease, unspecified: Secondary | ICD-10-CM | POA: Diagnosis not present

## 2023-10-29 DIAGNOSIS — E7800 Pure hypercholesterolemia, unspecified: Secondary | ICD-10-CM | POA: Diagnosis not present

## 2023-10-29 DIAGNOSIS — I10 Essential (primary) hypertension: Secondary | ICD-10-CM | POA: Diagnosis not present

## 2023-10-29 DIAGNOSIS — R600 Localized edema: Secondary | ICD-10-CM | POA: Diagnosis not present

## 2023-10-29 DIAGNOSIS — R778 Other specified abnormalities of plasma proteins: Secondary | ICD-10-CM | POA: Diagnosis not present

## 2023-11-09 ENCOUNTER — Ambulatory Visit: Payer: Self-pay | Admitting: Internal Medicine

## 2023-11-09 NOTE — Telephone Encounter (Signed)
 FYI Only or Action Required?: FYI only for provider.  Patient is followed in Pulmonology for COPD, last seen on 09/25/2023 by Geronimo Amel, MD.  Called Nurse Triage reporting Cough.  Symptoms began 4 days ago.  Interventions attempted: Rescue inhaler and Maintenance inhaler.  Symptoms are: unchanged.  Triage Disposition: See Physician Within 24 Hours  Patient/caregiver understands and will follow disposition?: Yes  Copied from CRM #[id number]. Topic: Clinical - Red Word Triage >> Nov 09, 2023 11:45 AM Russell PARAS wrote: Red Word that prompted transfer to Nurse Triage:   Severe cough started about 4 days ago Using OTC and not working Cough goes between productive and non productive No wheezing or SOB  Pt of Ramaswamy Reason for Disposition  SEVERE coughing spells (e.g., whooping sound after coughing, vomiting after coughing)  Answer Assessment - Initial Assessment Questions 1. ONSET: When did the cough begin?      Started four days ago 2. SEVERITY: How bad is the cough today?      8 out of 10 in severity 3. SPUTUM: Describe the color of your sputum (e.g., none, dry cough; clear, white, yellow, green)     white 4. HEMOPTYSIS: Are you coughing up any blood? If Yes, ask: How much? (e.g., flecks, streaks, tablespoons, etc.)     no 5. DIFFICULTY BREATHING: Are you having difficulty breathing? If Yes, ask: How bad is it? (e.g., mild, moderate, severe)      No shortness of breath 6. FEVER: Do you have a fever? If Yes, ask: What is your temperature, how was it measured, and when did it start?     no 7. CARDIAC HISTORY: Do you have any history of heart disease? (e.g., heart attack, congestive heart failure)      no 8. LUNG HISTORY: Do you have any history of lung disease?  (e.g., pulmonary embolus, asthma, emphysema)     Yes-COPD 9. PE RISK FACTORS: Do you have a history of blood clots? (or: recent major surgery, recent prolonged travel, bedridden)      no 10. OTHER SYMPTOMS: Do you have any other symptoms? (e.g., runny nose, wheezing, chest pain)       no 12. TRAVEL: Have you traveled out of the country in the last month? (e.g., travel history, exposures)       no  Protocols used: Cough - Acute Productive-A-AH

## 2023-11-10 ENCOUNTER — Telehealth: Payer: Self-pay | Admitting: *Deleted

## 2023-11-10 ENCOUNTER — Ambulatory Visit (INDEPENDENT_AMBULATORY_CARE_PROVIDER_SITE_OTHER): Admitting: Primary Care

## 2023-11-10 ENCOUNTER — Encounter: Payer: Self-pay | Admitting: Primary Care

## 2023-11-10 VITALS — BP 118/68 | HR 88 | Temp 97.7°F | Ht 64.5 in | Wt 149.4 lb

## 2023-11-10 DIAGNOSIS — G4736 Sleep related hypoventilation in conditions classified elsewhere: Secondary | ICD-10-CM | POA: Diagnosis not present

## 2023-11-10 DIAGNOSIS — J069 Acute upper respiratory infection, unspecified: Secondary | ICD-10-CM

## 2023-11-10 DIAGNOSIS — J441 Chronic obstructive pulmonary disease with (acute) exacerbation: Secondary | ICD-10-CM

## 2023-11-10 DIAGNOSIS — Z9981 Dependence on supplemental oxygen: Secondary | ICD-10-CM

## 2023-11-10 DIAGNOSIS — J439 Emphysema, unspecified: Secondary | ICD-10-CM | POA: Diagnosis not present

## 2023-11-10 MED ORDER — PREDNISONE 10 MG PO TABS: ORAL_TABLET | ORAL | 0 refills | Status: AC

## 2023-11-10 NOTE — Telephone Encounter (Signed)
 Patient has acute appt this afternoon @1 :30, closing message.

## 2023-11-10 NOTE — Progress Notes (Signed)
 @Patient  ID: Shelby Lin, female    DOB: 1940-08-22, 83 y.o.   MRN: [id number]  Chief Complaint  Patient presents with   Cough    Wet, clear mucous. Denies fever and chest pain. Only taken cough medicine and Tylenol .     Referring provider: Claudene Pellet, MD  HPI: 83 year old female, former smoker. PMH significant for COPD, bronchiectasis, nocturnal hypoxemia, GERD, UC, hypothyroidism, type 2 diabetes.    Previous [facility] pulmonary encounter:    09/25/2023 -       Chief Complaint  Patient presents with   Follow-up      COPD Pt states she is doing okay but had COVID in June. Pt was in the hospital for 5 days in July. She complains of tight chest pain in the middle of her chest. and vertigo.         HPI Shelby Lin 83 y.o. -returns for follow-up.  Last early part of the year.  After that she seen expectation about in June 2020 for she had COVID and then in July 2025 she got admitted for dizziness and vertigo and I reviewed the chart from this hospitalization she was there for 5 days but she also ended up getting this diagnosed with chronic systolic dysfunction new diagnosis.  Along with elevated troponins that was considered myocardial infarction not due to obstructive coronary artery disease [MINOCA).  She is currently close to baseline.  She did have a CT scan of the chest in July 2025 that did not have any lung cancer but had less than 3 mm nodules.  She no longer needs lung cancer screening.  Even though she is better her vertigo still continues.  She has an upcoming appointment with ear nose throat       Simple office walk 185 feet x  3 laps goal with forehead probe 05/22/2022   03/09/2023    O2 used ra ra room air  Number laps completed 3 Sit stand x 15  Comments about pace Noraml. No cane    Resting Pulse Ox/HR 97% and 86/min And 94% with a heart rate of 63  Final Pulse Ox/HR 98% and 109/min 84% with a heart rate of 62  Desaturated </= 88% no    Desaturated <= 3%  points no    Got Tachycardic >/= 90/min yes    Symptoms at end of test none    Miscellaneous comments x New onset exercise hypoxemia    11/10/2023- Interim hx  Discussed the use of AI scribe software for clinical note transcription with the patient, who gave verbal consent to proceed.  History of Present Illness Shelby Lin is an 83 year old female with interstitial lung disease who presents with worsening respiratory symptoms.  She has been experiencing shortness of breath and productive cough with clear sputum for the past couple of weeks. No recent antibiotic use has been reported. She has chills but no fevers. No recent sick contact. She wears oxygen  at night. No daytime requirements. Cough is not affecting her breathing except when she has an attack. Albuterol  causes jitteriness.    Allergies  Allergen Reactions   Acyclovir And Related Other (See Comments)    Unknown    Allegra [Fexofenadine] Hives   Amoxicillin Hives   Codeine Nausea And Vomiting   Elemental Sulfur Hives   Enalapril Maleate Cough   Hydrocodone  Bit-Homatrop Mbr Other (See Comments)    Unknown    Hydrocodone -Acetaminophen  Other (See Comments)    Unknown  Keflex [Cephalexin] Hives   Levofloxacin Other (See Comments)    insomnia   Lorabid [Loracarbef] Hives   Misc. Sulfonamide Containing Compounds Hives   Sulfa Antibiotics Hives   Sulfur Hives    Immunization History  Administered Date(s) Administered   Fluzone Influenza virus vaccine,trivalent (IIV3), split virus 01/17/2015, 01/08/2016, 11/27/2017, 12/14/2018   INFLUENZA, HIGH DOSE SEASONAL PF 12/01/2012, 12/05/2013, 12/12/2016, 11/27/2017, 12/14/2018, 01/01/2021   Influenza Split 11/30/2007   Influenza-Unspecified 12/18/2011, 02/16/2023   PFIZER(Purple Top)SARS-COV-2 Vaccination 05/05/2019, 05/26/2019, 12/12/2019   PNEUMOCOCCAL CONJUGATE-20 04/21/2023   Pneumococcal Conjugate-13 07/18/2015, 10/16/2017   Pneumococcal Polysaccharide-23 10/23/2008,  10/20/2018   Td 07/13/2007   Tdap 07/27/2017   Zoster, Live 05/04/2014    Past Medical History:  Diagnosis Date   Anxiety    Arthritis    Asthma in child    Atherosclerotic heart disease of native coronary artery without angina pectoris    Cataracts, bilateral    Combined hyperlipidemia    Constipation, unspecified    COPD (chronic obstructive pulmonary disease) (HCC)    Coronary artery spasm (HCC)    Diabetic neuropathy (HCC)    DM (diabetes mellitus) with complications (HCC)    Dupuytren's contracture    Dyspepsia    Familial tremor    Fatigue    Fibromyalgia syndrome    GERD (gastroesophageal reflux disease)    Hashimoto's thyroiditis    Hiatal hernia    History of colon polyps    Hx of Hashimoto thyroiditis    Hypercholesteremia    Hypertension    Hypothyroidism, acquired, autoimmune    IBS (irritable bowel syndrome)    Meniere's disease of left ear    Migraine without aura and without status migrainosus, not intractable    Multinodular goiter (nontoxic)    Nonexudative macular degeneration    Raynaud's syndrome without gangrene    Situational stress    Tachycardia    Thyroiditis, autoimmune    Ulcerative colitis, unspecified, without complications (HCC)    Vitamin B 12 deficiency    Vitamin D  deficiency    Wears glasses     Tobacco History: Social History   Tobacco Use  Smoking Status Former   Current packs/day: 0.00   Average packs/day: 0.5 packs/day for 42.0 years (21.0 ttl pk-yrs)   Types: Cigarettes   Start date: 02/22/1959   Quit date: 02/21/2001   Years since quitting: 22.7  Smokeless Tobacco Never   Counseling given: Not Answered   Outpatient Medications Prior to Visit  Medication Sig Dispense Refill   albuterol  (VENTOLIN  HFA) 108 (90 Base) MCG/ACT inhaler Inhale 1-2 puffs into the lungs every 6 (six) hours as needed for wheezing or shortness of breath. 18 g 6   aspirin  EC 81 MG tablet Take 1 tablet (81 mg total) by mouth daily.      Cyanocobalamin  (VITAMIN B 12 PO) Take by mouth daily.     DULoxetine  (CYMBALTA ) 30 MG capsule Take 30 mg by mouth 2 (two) times daily.     furosemide (LASIX) 20 MG tablet Take 20 mg by mouth as needed.     LAGEVRIO 200 MG CAPS capsule Take 4 capsules by mouth 2 (two) times daily.     LORazepam  (ATIVAN ) 0.5 MG tablet Take 0.25 mg by mouth See admin instructions. Take 1/2 tablet by mouth 2-3 times a day as needed for anxiety.     Multiple Vitamin (MULTIVITAMIN) tablet Take 1 tablet by mouth daily.     omeprazole (PRILOSEC) 20 MG capsule Take 20 mg by mouth  daily.     rosuvastatin  (CRESTOR ) 20 MG tablet Take 1 tablet (20 mg total) by mouth daily. 90 tablet 3   SYNTHROID  88 MCG tablet TAKE DAILY BEFORE BREAKFAST. 1 TABLET 6 DAYS EACH WEEK, BUT TAKE ONLY 1/2 TABLET ON THE 7TH DAY 30 tablet 5   TRELEGY ELLIPTA  100-62.5-25 MCG/ACT AEPB INHALE 1 PUFF BY MOUTH EVERY DAY 60 each 11   VITAMIN D , CHOLECALCIFEROL, PO Take 50,000 Units by mouth once a week. On Wednesdays     No facility-administered medications prior to visit.   Review of Systems  Review of Systems  Constitutional: Negative.   HENT: Negative.    Respiratory:  Positive for cough. Negative for shortness of breath and wheezing.    Physical Exam  BP 118/68   Pulse 88   Temp 97.7 F (36.5 C)   Ht 5' 4.5 (1.638 m)   Wt 149 lb 6.4 oz (67.8 kg)   SpO2 97% Comment: RA  BMI 25.25 kg/m  Physical Exam Constitutional:      General: She is not in acute distress.    Appearance: Normal appearance. She is not ill-appearing.  HENT:     Head: Normocephalic and atraumatic.  Cardiovascular:     Rate and Rhythm: Normal rate and regular rhythm.  Pulmonary:     Effort: Pulmonary effort is normal.     Breath sounds: Normal breath sounds. No wheezing, rhonchi or rales.  Musculoskeletal:        General: Normal range of motion.  Skin:    General: Skin is warm and dry.  Neurological:     General: No focal deficit present.     Mental Status:  She is alert and oriented to person, place, and time. Mental status is at baseline.  Psychiatric:        Mood and Affect: Mood normal.        Behavior: Behavior normal.        Thought Content: Thought content normal.        Judgment: Judgment normal.     Lab Results:  CBC    Component Value Date/Time   WBC 9.5 09/08/2023 0222   RBC 3.68 (L) 09/08/2023 0222   HGB 11.3 (L) 09/08/2023 0222   HCT 35.2 (L) 09/08/2023 0222   PLT 255 09/08/2023 0222   MCV 95.7 09/08/2023 0222   MCH 30.7 09/08/2023 0222   MCHC 32.1 09/08/2023 0222   RDW 12.3 09/08/2023 0222   LYMPHSABS 2.7 03/09/2023 1638   MONOABS 0.9 03/09/2023 1638   EOSABS 0.3 03/09/2023 1638   BASOSABS 0.1 03/09/2023 1638    BMET    Component Value Date/Time   NA 140 09/07/2023 1019   K 4.4 09/07/2023 1019   CL 103 09/07/2023 1019   CO2 27 09/07/2023 1019   GLUCOSE 109 (H) 09/07/2023 1019   BUN 10 09/07/2023 1019   CREATININE 0.63 09/07/2023 1019   CREATININE 0.59 (L) 02/22/2016 0001   CALCIUM  9.0 09/07/2023 1019   CALCIUM  9.7 04/06/2012 1019   GFRNONAA >60 09/07/2023 1019   GFRAA >60 02/28/2018 1732    BNP    Component Value Date/Time   BNP 25.5 02/28/2018 1732    ProBNP No results found for: PROBNP  Imaging: No results found.   Assessment & Plan:   1. COPD with acute exacerbation (HCC) (Primary)  2. Nocturnal hypoxemia due to emphysema (HCC)  3. Upper respiratory tract infection, unspecified type  Assessment and Plan Assessment & Plan Acute bronchitis with COPD exacerbation Presents  with an acute lower respiratory infection characterized by a productive cough with clear mucus x 1 week. Reports no fever or body aches. No recent sick contacts. No associated allergy symptoms. She is compliant with Trelegy. Faint wheezing noted on exam to upper lungs. VSS.  - Send prednisone  prescription 40mg  x 3 days, 30mg  x 3 days, 20mg  x 3 days, 10mg  x 3 day  - Start over the counter Robitussin-DM for cough  -  Continue Trelegy 100mcg one puff daily and albuterol  2 puffs every 6 hours for breakthrough symptoms   Nocturnal hypoxemia - Continue to wear oxygen  at night   Recording duration: 11 minutes  Almarie LELON Ferrari, NP 11/10/2023

## 2023-11-10 NOTE — Patient Instructions (Addendum)
  VISIT SUMMARY: Today, we discussed your worsening respiratory symptoms, including shortness of breath and a productive cough.   YOUR PLAN: -ACUTE LOWER RESPIRATORY INFECTION WITH PRODUCTIVE COUGH: You have an acute lower respiratory infection, which is an infection in your lungs causing a productive cough. We will send a prescription for prednisone  to help reduce inflammation. Continue to wear oxygen  at night.   Rx: Prednisone  taper start today and take as directed Start over the counter robitussin-dm  Use trelegy daily and albuterol  2 puffs every 6 hours for breakthrough symptoms   Follow-up: If symptoms do not improve notify office in 3-5 days and we will get a CXR and labs

## 2023-11-11 DIAGNOSIS — E039 Hypothyroidism, unspecified: Secondary | ICD-10-CM | POA: Diagnosis not present

## 2023-11-12 NOTE — Telephone Encounter (Signed)
 Saw Shelby Lin to be addressed on 11/10/2023 .NFN

## 2023-11-13 DIAGNOSIS — J449 Chronic obstructive pulmonary disease, unspecified: Secondary | ICD-10-CM | POA: Diagnosis not present

## 2023-11-13 DIAGNOSIS — K5900 Constipation, unspecified: Secondary | ICD-10-CM | POA: Diagnosis not present

## 2023-11-13 DIAGNOSIS — R051 Acute cough: Secondary | ICD-10-CM | POA: Diagnosis not present

## 2023-11-17 DIAGNOSIS — R059 Cough, unspecified: Secondary | ICD-10-CM | POA: Diagnosis not present

## 2023-11-24 DIAGNOSIS — E1140 Type 2 diabetes mellitus with diabetic neuropathy, unspecified: Secondary | ICD-10-CM | POA: Diagnosis not present

## 2023-11-24 DIAGNOSIS — E118 Type 2 diabetes mellitus with unspecified complications: Secondary | ICD-10-CM | POA: Diagnosis not present

## 2023-11-24 DIAGNOSIS — E039 Hypothyroidism, unspecified: Secondary | ICD-10-CM | POA: Diagnosis not present

## 2023-11-24 DIAGNOSIS — E7800 Pure hypercholesterolemia, unspecified: Secondary | ICD-10-CM | POA: Diagnosis not present

## 2023-12-02 DIAGNOSIS — H353131 Nonexudative age-related macular degeneration, bilateral, early dry stage: Secondary | ICD-10-CM | POA: Diagnosis not present

## 2023-12-08 DIAGNOSIS — R030 Elevated blood-pressure reading, without diagnosis of hypertension: Secondary | ICD-10-CM | POA: Diagnosis not present

## 2023-12-08 DIAGNOSIS — R519 Headache, unspecified: Secondary | ICD-10-CM | POA: Diagnosis not present

## 2023-12-08 DIAGNOSIS — M542 Cervicalgia: Secondary | ICD-10-CM | POA: Diagnosis not present

## 2023-12-14 DIAGNOSIS — I10 Essential (primary) hypertension: Secondary | ICD-10-CM | POA: Diagnosis not present

## 2024-03-01 ENCOUNTER — Ambulatory Visit: Admitting: Internal Medicine

## 2024-03-01 ENCOUNTER — Encounter: Payer: Self-pay | Admitting: Internal Medicine

## 2024-03-01 VITALS — BP 124/66 | HR 61 | Temp 97.5°F | Ht 64.5 in | Wt 142.0 lb

## 2024-03-01 DIAGNOSIS — Z87891 Personal history of nicotine dependence: Secondary | ICD-10-CM

## 2024-03-01 DIAGNOSIS — J449 Chronic obstructive pulmonary disease, unspecified: Secondary | ICD-10-CM

## 2024-03-01 DIAGNOSIS — J441 Chronic obstructive pulmonary disease with (acute) exacerbation: Secondary | ICD-10-CM | POA: Diagnosis not present

## 2024-03-01 LAB — CBC WITH DIFFERENTIAL/PLATELET
Basophils Absolute: 0 K/uL (ref 0.0–0.1)
Basophils Relative: 0.5 % (ref 0.0–3.0)
Eosinophils Absolute: 0.3 K/uL (ref 0.0–0.7)
Eosinophils Relative: 4.1 % (ref 0.0–5.0)
HCT: 39.1 % (ref 36.0–46.0)
Hemoglobin: 12.8 g/dL (ref 12.0–15.0)
Lymphocytes Relative: 25.1 % (ref 12.0–46.0)
Lymphs Abs: 1.8 K/uL (ref 0.7–4.0)
MCHC: 32.8 g/dL (ref 30.0–36.0)
MCV: 91.7 fl (ref 78.0–100.0)
Monocytes Absolute: 0.7 K/uL (ref 0.1–1.0)
Monocytes Relative: 10.1 % (ref 3.0–12.0)
Neutro Abs: 4.2 K/uL (ref 1.4–7.7)
Neutrophils Relative %: 60.2 % (ref 43.0–77.0)
Platelets: 239 K/uL (ref 150.0–400.0)
RBC: 4.26 Mil/uL (ref 3.87–5.11)
RDW: 14.2 % (ref 11.5–15.5)
WBC: 7 K/uL (ref 4.0–10.5)

## 2024-03-01 LAB — POC COVID19 BINAXNOW: SARS Coronavirus 2 Ag: NEGATIVE

## 2024-03-01 LAB — POCT INFLUENZA A/B
Influenza A, POC: NEGATIVE
Influenza B, POC: NEGATIVE

## 2024-03-01 MED ORDER — AZITHROMYCIN 250 MG PO TABS: ORAL_TABLET | ORAL | 0 refills | Status: AC

## 2024-03-01 MED ORDER — PREDNISONE 10 MG PO TABS: ORAL_TABLET | ORAL | 0 refills | Status: AC

## 2024-03-01 NOTE — Patient Instructions (Addendum)
"    ICD-10-CM   1. Stage 3 severe COPD by GOLD classification (HCC)  J44.9     2. Screening for lung cancer  Z12.2     3. Vertigo  R42     4. Chronic systolic dysfunction of left ventricle  I51.9       Stage 3 severe COPD by GOLD classification (HCC) COPD exacerbation 03/01/2024   - having copd flare up  Plan - do spot antigen test for covid and flu - check cbc with diff - to see if you qualify for biologic in case in the future - Please take prednisone  40 mg x1 day, then 30 mg x1 day, then 20 mg x1 day, then 10 mg x1 day, and then 5 mg x1 day and stop -Z pak  - continue trelegy scheduled - continue albuterol  as needed - continue night o2  Screening for lung cancer  - no cancer on CT chest July 2025 - very small nodules < 3mm only on CT uJuly 2025  Plan  - no futher CT chest unless clinically indicated  Vertigo- admit July 2025  Plan  - per PCP and ENT  Chronic systolic dysfunction of left ventricle - new diagnosis July 2025 following MINOCA  Plan  - per cardiology   FOLLOWUP 6 months with APP or [facility] 15 min "

## 2024-03-01 NOTE — Progress Notes (Signed)
 "        IOV 02/11/2017  Chief Complaint  Patient presents with   Advice Only    Self referral for COPD. States she was dx by Dr. Alberta Lin x2 years ago. Has SOB with exertion and anxiety and has an occ. cough. Denies any CP.   84 year old female with limited smoking history and quit many years ago.  She tells me for the last few years she has had insidious onset of shortness of breath that is mild to moderate in severity brought on with exertion relieved by rest.  It is stable overall.  Does not much of associated cough except except very mild.  She does have associated ACE inhibitor intake.  The dyspnea is stable.  A few years ago based on pulmonary function test done?  At [facility] health location but I do not have the results patient was started on Spiriva.  There is a chest x-ray in the system from few to several years ago that shows hyperinflation personally visualized and confirmed the findings.  Few to several months ago was switched to incruse/.  She honestly does not know if the symptoms are helping her.  She denies any wheezing orthopnea proximal nocturnal dyspnea hemoptysis fever chills edema.  Some 6 weeks ago she describes what sounds like an asthma or COPD exacerbation treated with antibiotics and prednisone  x2 and then back to baseline currently.  At this point in time she wants pulmonary support for her diagnosis of COPD.  Her current symptoms are detailed below.  Blood work August 2018 normal hemoglobin A1c and TSH  OV 04/01/2017  Chief Complaint  Patient presents with   Follow-up    PFT done today and HRCT done 02/11/17.  Pt states she has been doing good.  No real complaints of cough other than acid reflux.   Follow-up investigation for COPD.  Since her last visit she had CT scan of the chest that does show bronchiectasis associated with emphysema.  Pulmonary function test today shows Gold stage II COPD but with significant bronchodilator response.  She feels good and stable.   Cough is very minimal.  Med review shows that she is just on anticholinergic.  She is not on inhaled corticosteroid or bronchodilator.  I noticed that she is on ACE inhibitor.  She tells me that she can cough significantly after a viral infection.  However at baseline she only has mild cough.  She room was having asthma as a child but she says she outgrew it.  Incidental finding of 5 mm right middle lobe nodule in December 2018 CT chest  New issue of her having to undergo upper endoscopy by Dr. Celestia at [facility].  She has had colonoscopy a few years ago under propofol  and she tolerated this fine.  She says gastroenterology is not aware of her COPD diagnosis and the severity of impairment of the lung function although she is quite functional and has tolerated sedation before.   OV 10/16/2017  Chief Complaint  Patient presents with   Follow-up    Pt states she has been having SOB due to the heat. Denies any cough or CP.    Follow-up Gold stage III COPD: Overall stable since last visit. In fact COPD cat score is improved significantly. It is only for an very minimal symptoms. This is on trouble inhaler therapy. She checked her inhaler technique with me and it looked good.She does she had a bad July because of the heat and humidity but  she stated endorse. She is grieving because she lost her little chihuahua at age 51 - Shelby Lin was his name     Alpha 1 - 129 and MM 02/18/18   Walking desaturation test on 02/11/2017 185 feet x 3 laps on ROOM AIR:  did not desaturate. Rest pulse ox was 96%, final pulse ox was 95%. HR response 99/min at rest to 105/min at peak exertion. Patient Shelby Lin  Did not Desaturate < 88% . Shelby Lin did not  Desaturated </= 3% points. Shelby Lin yes did get tachyardic   IMPRESSION: ct chest 2018 1. Minimal scattered cylindrical bronchiectasis with associated minimal tree-in-bud opacity in the medial upper lobes and medial right middle lobe. Findings could  be due to atypical mycobacterial infection (MAI) . 2. Mild-to-moderate centrilobular emphysema with mild diffuse bronchial wall thickening, compatible with the provided history of COPD. 3. Two small solid pulmonary nodules in the right lung, largest with average diameter 5 mm in the right middle lobe. No follow-up needed if patient is low-risk (and has no known or suspected primary neoplasm). Non-contrast chest CT can be considered in 12 months if patient is high-risk. This recommendation follows the consensus statement: Guidelines for Management of Incidental Pulmonary Nodules Detected on CT Images: From the Fleischner Society 2017; Radiology 2017; 284:228-243. 4. One vessel coronary atherosclerosis.   Aortic Atherosclerosis (ICD10-I70.0) and Emphysema (ICD10-J43.9).     Electronically Signed   By: Shelby Lin M.D.   On: 02/11/2017 16:30  OV 02/01/2018  Subjective:  [name], female , DOB: 03/08/1940 , age 51 y.o. , MRN: [id number] , ADDRESS: [id number] [place] [address]   02/01/2018 -   Chief Complaint  Patient presents with   Follow-up    Pt states that her SOB has improved some, CT review   Follow-up Gold stage III COPD [MM phenotype] postbronchodilator FEV1 0.96 L / 46% with 26% bronchodilator response and DLCO 12.8/52% in February 2019.  CT scan of the chest with mild scattered bronchiectasis.  Shelby Lin 84 y.o. -returns for follow-up of Gold stage III COPD.  She is taking triple inhaler therapy in the form of Incruse and Breo.  Overall her symptoms are stable and well-controlled.  COPD CAT score is 7.  Not much cough some amount of shortness of breath no chest pain at all.  Doing really well overall.  Up-to-date with her vaccines.  She does tell me that primary care physician thought her carbon dioxide level on chemistry was high.  Review of her chart shows, dioxide level December 2017 was normal.  She is asking for Incruse samples because  she is in the donut hole.  However he did not have them.  We talked about starting Trelegy in January 2020 and she is open and willing to look into this option.  Lung nodules: She had CT chest December 2019.  Follow-up from 1 year ago.  Nodules are diminished.  New finding of coronary artery calcification on CT scan December 2019: One-vessel coronary artery calcification seen.  No chest pains.  No prior stress test history.  She says she will talk about it with her primary care physician rather than take a cardiology referral right now.  IMPRESSION CT CHEST 1. Right middle lobe small solid pulmonary nodules are stable to decreased since 02/11/2017 chest CT, considered benign. No new significant pulmonary nodules. 2. Stable minimal scattered cylindrical bronchiectasis in medial upper lobes and medial right middle lobe. No  evidence of active pulmonary infection. 3. One vessel coronary atherosclerosis. 4. Small hiatal hernia.   Aortic Atherosclerosis (ICD10-I70.0) and Emphysema (ICD10-J43.9).     Electronically Signed   By: Shelby Lin M.D.   On: 01/28/2018 12:22  ROS - per Shelby   OV 04/24/2020  Subjective:  [name], female , DOB: 04/16/1940 , age 31 y.o. , MRN: [id number] , ADDRESS: [address] [place] [address] PCP Claudene Pellet, MD Patient Care Team: Claudene Pellet, MD as PCP - General (Family Medicine) Jeffrie Oneil BROCKS, MD as PCP - Cardiology (Cardiology)  This Provider for this visit: Treatment Team:  Attending Provider: Tonna Redell SQUIBB, NP    04/24/2020 -   Chief Complaint  Patient presents with   Follow-up    Doing well   Stage III COPD MM phenotype  Shelby Shelby Lin 84 y.o. -returns for follow-up.  I personally last saw her in December 2019.  After that she is followed up with nurse practitioners with the pandemic.  A year ago January 2020 when she had COVID that was mild.  She continues on Trelegy.  She is rarely used albuterol .  She uses  nighttime oxygen .  Her COPD CAT score is 4.  She is doing really well.  No chest pains no shortness of breath.  Last CT scan of the chest was in 2019.  Last PFT was also in 2019.  She does not smoke.  She is up-to-date with her vaccines.   CAT Score 04/24/2020 02/01/2018  Total CAT Score 4 7     OV 01/08/2021  Subjective:  [name], female , DOB: 07-Apr-1940 , age 67 y.o. , MRN: [id number] , ADDRESS: [address] [place] [address] PCP Claudene Pellet, MD Patient Care Team: Claudene Pellet, MD as PCP - General (Family Medicine) Jeffrie Oneil BROCKS, MD as PCP - Cardiology (Cardiology)  This Provider for this visit: Treatment Team:  Attending Provider: Geronimo Amel, MD    01/08/2021 -   Chief Complaint  Patient presents with   Follow-up    No new concerns, pft done today   Stage III COPD MM phenotype  Shelby Shelby Lin 84 y.o. -presents for routine follow-up.  After seeing me last visit she did see nurse practitioner.  She had pulmonary function test today and the pulmonary function test is stable in the last 3 years.  She is up-to-date with her vaccination.  She is looking forward to having a great granddaughter in April 2023.  Her granddaughter is now 69 and has been married for 12 years and is finally been able to conceive.  The patient is cautiously optimistic and excited.  She did say that in July 2022 she fell down through her deck and bruised her right thigh and therefore she walks with a limp.  Other than that she is doing fine.  No respiratory exacerbations.  I did caution her about being vigilant about respiratory exacerbations particularly in the fall in the winter especially with human clustering.   CAT Score 04/24/2020 02/01/2018  Total CAT Score 4 7   IMPRESSION: 1. Right middle lobe small solid pulmonary nodules are stable to decreased since 02/11/2017 chest CT, considered benign. No new significant pulmonary nodules. 2. Stable minimal scattered  cylindrical bronchiectasis in medial upper lobes and medial right middle lobe. No evidence of active pulmonary infection. 3. One vessel coronary atherosclerosis. 4. Small hiatal hernia.   Aortic Atherosclerosis (ICD10-I70.0) and Emphysema (ICD10-J43.9).  Electronically Signed   By: Shelby Lin M.D.   On: 01/28/2018 12:22    02/26/2021: OV with Cobb NP for worsening cough and nasal congestion.  Her symptoms started on 12/24 and have persisted since.  She notes that her cough is productive with clear sputum production. She has had nasal congestion with clear rhinorrhea and sinus pressure/pain. She has noticed a mild increase in her shortness of breath with exertion as well. She was previously started on doxycyline for 7 days and prednisone  for 5. She did notice a slight improvement of her symptoms while on the prednisone  and still has 1 dose of doxy left. Extended doxycycline  to complete 10 day course and prednisone  taper.   12/30/2021: OV with Cobb NP for acute visit.  On Saturday, she woke up and noticed that she had increased cough.  Since then she started having some more chest congestion and wheezing.  Yesterday, she started producing phlegm.  Describes it as yellow in color.  She has had a few sick exposures at her church, with confirmed cases of COVID.  She did not do any viral testing at home.  No significant URI symptoms.  Denies any fevers, chills, hemoptysis, increased shortness of breath.  She uses Trelegy every day.  Has not had to use albuterol .  Not using anything over-the-counter for cough.  Does have a flutter valve at home but has not restarted this yet. Treated for AECOPD/bronchiectatic flare with empiric doxycycline  and prednisone  burst. CXR clear.   01/13/2022: Today - follow up Patient presents today for follow up after being treated for AECOPD. She did have some sick exposures with confirmed COVID cases prior to becoming ill. Viral testing was unavailable in office so she  was advised to test at home, which was negative. She did not complete the doxycycline  because it gave her very severe indigestion, which resolved after she stopped it. She did complete the prednisone  burst and feels much better. Her chest congestion has resolved and she hasn't noticed much coughing. Feels like her breathing is back to her baseline. She continues on Trelegy daily. Using flutter valve at night.    OV 05/22/2022  Subjective:  [name], female , DOB: 1940/05/15 , age 89 y.o. , MRN: [id number] , ADDRESS: [address] [place] [address] PCP Claudene Pellet, MD Patient Care Team: Claudene Pellet, MD as PCP - General (Family Medicine) Jeffrie Oneil BROCKS, MD as PCP - Cardiology (Cardiology)  This Provider for this visit: Treatment Team:  Attending Provider: Geronimo Amel, MD    05/22/2022 -   Chief Complaint  Patient presents with   Follow-up    F/up      Shelby Shelby JULIANNA Cech 84 y.o. -Gold stage III COPD patient returning for follow-up.  I personally not seen her in a year and a half.  She states she spent 2023 without any exacerbations and she continued Trelegy.  She enjoyed the birth of her great grandchild in April 2023.  However in 2024 she started getting COPD exacerbation.  She saw a publishing rights manager.  She started having some GI upset with prednisone  but not doxycycline .  Thought she told me but she told the nurse practitioner the opposite.  In any event she says she can always take doxycycline  if needed.  Currently not on doxycycline  currently on Trelegy.  The only other health issues that she is fallen 7 times in the last 18 months and she occasionally now needs a cane.  She has been  attending physical therapy since October 2023 and has not fallen since.  She does have some residual shortness of breath with exertion.  I did talk to her about the American Cancer Society lung cancer screening guidelines that indicate CT scan to 84 years of age.  Her last  CT scan was in 2019.  She technically does not meet criteria for lung cancer screening but she does have some shortness of breath.  She is willing to have a CT.  We talked about RSV vaccine counseling.  She will have it commercially.  Did review the labs and she does have some baseline eosinophils but she is only had 1 course prednisone  in the last 14 months.      OV 07/24/2022  Subjective:  [name], female , DOB: Jun 21, 1940 , age 9 y.o. , MRN: [id number] , ADDRESS: [address] [place] [address] PCP Claudene Pellet, MD Patient Care Team: Claudene Pellet, MD as PCP - General (Family Medicine) Jeffrie Oneil BROCKS, MD as PCP - Cardiology (Cardiology)  This Provider for this visit: Treatment Team:  Attending Provider: Geronimo Amel, MD  Stage III COPD MM phenotype  07/24/2022 -   Chief Complaint  Patient presents with   Acute Visit    Cough not productive now but was and mucus was yellow     Shelby Shelby Lin 84 y.o. -this is an acute visit.  She says on Saturday before Memorial Day Monday she started having a cough.  She thinks the pollen is worse this year.  No previous known allergies to seasons.  No sick contacts.  Then Sunday before Memorial Day things got worse with yellow mucus and wheezing.  No fever.  Monday things were even worse.  She then on Tuesday, 07/23/2022 called and acutely.  Dr. Ozell Brought gave her azithromycin  and prednisone .  Today second day of prednisone  and azithromycin .  She is beginning to feel better but still having a cough.  She is taking Delsym.  She not sure about the cause of the current exacerbations.  Previously she has had high eosinophils but no RAST allergy panel on review of labs.  She also reports that she never got results of the CT on 06/15/2022.  Review the records indicate she got the results on 06/23/2022.  I shared the results again.   Of note she continues to have ongoing chronic headaches.  She has seen ENT.  She is  known to have left-sided Mnire's.  She has had right-sided otalgia.  Temporal mandibular joint arthritis has been suspected.  And referral to her dentist has been made.          Latest Reference Range & Units 01/17/13 11:13 09/26/14 00:01 02/28/18 17:32  Eosinophils Absolute 0.0 - 0.5 K/uL 0.2 0.3 0.3   OV 03/09/2023  Subjective:  [name], female , DOB: 1940-09-16 , age 49 y.o. , MRN: [id number] , ADDRESS: [address] [place] [address] PCP Claudene Pellet, MD Patient Care Team: Claudene Pellet, MD as PCP - General (Family Medicine) Jeffrie Oneil BROCKS, MD as PCP - Cardiology (Cardiology)  This Provider for this visit: Treatment Team:  Attending Provider: Geronimo Amel, MD    03/09/2023 -   Chief Complaint  Patient presents with   Follow-up    Pt states she has been having left side back pain. Using inhalers, no other concerns      Shelby Shelby Lin 85 y.o. -advanced COPD patient presents for follow-up she  is on baseline nighttime oxygen .  She has new onset exercise hypoxemia compared to March 2024 during a sit/stand test today.  However she is not feeling it.  She tells me that she had a good Christmas and started the New Year's season quite well she feels the COPD self is stable.  She did have repeated flareups in the early part of 2020 for at least 3 but since June 2024 no further exacerbations no hospitalizations at all no emergency room visits.  Her main issue right now with some constipation for which she is going to start pelvic floor exercises but she says she is also suffering from new onset left lower /infrascapular pain.  This pain is getting worse.  On and off.  When it is severe it feels like it is grabbing and poking her.  It does not wake her up.  Sometimes it is mild on average its moderate this no associated diaphoresis.  Her last CT scan of the chest was in April 2024 and was reassuring.  However the symptoms are new.  Of note because of  her recurrent COPD exacerbation I told her to get a IgE and CBC with differential last time but this has not been done she is willing to get that done at this time.    OV 09/25/2023  Subjective:  [name], female , DOB: [birth year] , age 38 y.o. , MRN: [id number] , ADDRESS: [address] [place] [address] PCP Claudene Pellet, MD Patient Care Team: Claudene Pellet, MD as PCP - General (Family Medicine) Jeffrie Oneil BROCKS, MD as PCP - Cardiology (Cardiology)  This Provider for this visit: Treatment Team:  Attending Provider: Geronimo Amel, MD    09/25/2023 -   Chief Complaint  Patient presents with   Follow-up    COPD Pt states she is doing okay but had COVID in June. Pt was in the hospital for 5 days in July. She complains of tight chest pain in the middle of her chest. and vertigo.      Shelby Shelby Lin 84 y.o. -returns for follow-up.  Last early part of the year.  After that she seen expectation about in June 2020 for she had COVID and then in July 2025 she got admitted for dizziness and vertigo and I reviewed the chart from this hospitalization she was there for 5 days but she also ended up getting this diagnosed with chronic systolic dysfunction new diagnosis.  Along with elevated troponins that was considered myocardial infarction not due to obstructive coronary artery disease [MINOCA).  She is currently close to baseline.  She did have a CT scan of the chest in July 2025 that did not have any lung cancer but had less than 3 mm nodules.  She no longer needs lung cancer screening.  Even though she is better her vertigo still continues.  She has an upcoming appointment with ear nose throat   OV 03/01/2024  Subjective:  [name], female , DOB: 06-16-1940 , age 67 y.o. , MRN: [id number] , ADDRESS: [address] [place] [address] PCP Claudene Pellet, MD Patient Care Team: Claudene Pellet, MD as PCP - General (Family Medicine) Jeffrie Oneil BROCKS, MD as PCP - Cardiology (Cardiology)  This Provider for this visit: Treatment Team:  Attending Provider: Geronimo Amel, MD    03/01/2024 -   Chief Complaint  Patient presents with   Acute Visit    Increased cough with white sputum x 4  days. She has occ wheezing.       Shelby Shelby Lin 84 y.o. -ADALYNN CORNE is an 84 year old female with COPD who presents with a new onset cough. Prrop EOS > 300  She has experienced a persistent cough for approximately three days, beginning on Saturday. She has been managing it by staying at home. There is no increase in shortness of breath beyond her usual baseline.  She reports a runny nose and occasional wheezing, which is more frequent than usual. She continues to use her regular inhaler, Trelegy, as prescribed.  No sick contacts are noted, and she has received the flu shot. She does not feel like she has the flu and denies having a fever.  She experiences chronic headaches with no change in frequency or intensity since the onset of her current symptoms.  She frequently experiences vertigo, a condition her father also suffers from.  She recalls being on prednisone  once or twice last year but does not remember the exact number of times.    Simple office walk 185 feet x  3 laps goal with forehead probe 05/22/2022  03/09/2023   O2 used ra ra room air  Number laps completed 3 Sit stand x 15  Comments about pace Noraml. No cane   Resting Pulse Ox/HR 97% and 86/min And 94% with a heart rate of 63  Final Pulse Ox/HR 98% and 109/min 84% with a heart rate of 62  Desaturated </= 88% no   Desaturated <= 3% points no   Got Tachycardic >/= 90/min yes   Symptoms at end of test none   Miscellaneous comments x New onset exercise hypoxemia     PFT     Latest Ref Rng & Units 01/08/2021   10:46 AM 04/01/2017    9:41 AM  PFT Results  FVC-Pre L 1.93  1.69   FVC-Predicted Pre % 73  61   FVC-Post L  2.02   FVC-Predicted Post %  73   Pre  FEV1/FVC % % 49  45   Post FEV1/FCV % %  48   FEV1-Pre L 0.95  0.76   FEV1-Predicted Pre % 48  36   FEV1-Post L  0.96   DLCO uncorrected ml/min/mmHg 10.82  12.81   DLCO UNC% % 57  52   DLCO corrected ml/min/mmHg 10.82    DLCO COR %Predicted % 57    DLVA Predicted % 88  84        LAB RESULTS last 96 hours No results found.       has a past medical history of Anxiety, Arthritis, Asthma in child, Atherosclerotic heart disease of native coronary artery without angina pectoris, Cataracts, bilateral, Combined hyperlipidemia, Constipation, unspecified, COPD (chronic obstructive pulmonary disease) (HCC), Coronary artery spasm, Diabetic neuropathy (HCC), DM (diabetes mellitus) with complications (HCC), Dupuytren's contracture, Dyspepsia, Familial tremor, Fatigue, Fibromyalgia syndrome, GERD (gastroesophageal reflux disease), Hashimoto's thyroiditis, Hiatal hernia, History of colon polyps, Hashimoto thyroiditis, Hypercholesteremia, Hypertension, Hypothyroidism, acquired, autoimmune, IBS (irritable bowel syndrome), Meniere's disease of left ear, Migraine without aura and without status migrainosus, not intractable, Multinodular goiter (nontoxic), Nonexudative macular degeneration, Raynaud's syndrome without gangrene, Situational stress, Tachycardia, Thyroiditis, autoimmune, Ulcerative colitis, unspecified, without complications (HCC), Vitamin B 12 deficiency, Vitamin D  deficiency, and Wears glasses.   reports that she quit smoking about 23 years ago. Her smoking use included cigarettes. She started smoking about 65 years ago. She has a 21 pack-year smoking history. She has never used smokeless tobacco.  Past Surgical  History:  Procedure Laterality Date   ANAL RECTAL MANOMETRY Bilateral 11/26/2022   Procedure: ANO RECTAL MANOMETRY;  Surgeon: Burnette Fallow, MD;  Location: [facility];  Service: Gastroenterology;  Laterality: Bilateral;   BREAST EXCISIONAL BIOPSY Left    benign   BREAST  EXCISIONAL BIOPSY Right    milk gland removed   BREAST LUMPECTOMY Left 1990   benign   BREAST LUMPECTOMY WITH NEEDLE LOCALIZATION Right 02/28/2013   Procedure: RIGHT BREAST NEEDLE LOCALIZATION LUMPECTOMY ;  Surgeon: Morene ONEIDA Olives, MD;  Location: [facility];  Service: General;  Laterality: Right;   CHOLECYSTECTOMY  2008   lapcholi   DILATION AND CURETTAGE OF UTERUS     LEFT HEART CATH AND CORONARY ANGIOGRAPHY N/A 09/07/2023   Procedure: LEFT HEART CATH AND CORONARY ANGIOGRAPHY;  Surgeon: Anner Alm ORN, MD;  Location: [facility];  Service: Cardiovascular;  Laterality: N/A;   LEFT HEART CATHETERIZATION WITH CORONARY ANGIOGRAM N/A 01/26/2013   Procedure: LEFT HEART CATHETERIZATION WITH CORONARY ANGIOGRAM;  Surgeon: Oneil Parchment, MD;  Location: [facility] CATH LAB;  Service: Cardiovascular;  Laterality: N/A;   PARTIAL HYSTERECTOMY  1971   TONSILLECTOMY      Allergies[1]  Immunization History  Administered Date(s) Administered   Fluzone Influenza virus vaccine,trivalent (IIV3), split virus 01/17/2015, 01/08/2016, 11/27/2017, 12/14/2018   INFLUENZA, HIGH DOSE SEASONAL PF 12/01/2012, 12/05/2013, 12/12/2016, 11/27/2017, 12/14/2018, 01/01/2021   Influenza Split 11/30/2007   Influenza-Unspecified 12/18/2011, 02/16/2023   PFIZER(Purple Top)SARS-COV-2 Vaccination 05/05/2019, 05/26/2019, 12/12/2019   PNEUMOCOCCAL CONJUGATE-20 04/21/2023   Pneumococcal Conjugate-13 07/18/2015, 10/16/2017   Pneumococcal Polysaccharide-23 10/23/2008, 10/20/2018   Td 07/13/2007   Tdap 07/27/2017   Zoster, Live 05/04/2014    Family History  Problem Relation Age of Onset   Breast cancer Mother 64   Hypertension Mother    Stroke Mother    Heart attack Father    Sudden death Father    Hypertension Sister    Diabetes Sister    Stroke Sister    Breast cancer Paternal Aunt 54 - 62   Ovarian cancer Paternal Aunt 60 - 69   Thyroid  disease Neg Hx     Current Medications[2]       Objective:   Vitals:   03/01/24 1341  BP: 124/66  Pulse: 61  Temp: (!) 97.5 F (36.4 C)  TempSrc: Oral  SpO2: 97%  Weight: 142 lb (64.4 kg)  Height: 5' 4.5 (1.638 m)    Estimated body mass index is 24 kg/m as calculated from the following:   Height as of this encounter: 5' 4.5 (1.638 m).   Weight as of this encounter: 142 lb (64.4 kg).  @WEIGHTCHANGE @  [employer]   03/01/24 1341  Weight: 142 lb (64.4 kg)     Physical Exam   General: No distress. Looks wll O2 at rest: no Cane present: no Sitting in wheel chair: no Frail: no Obese: no Neuro: Alert and Oriented x 3. GCS 15. Speech normal Psych: Pleasant Resp:  Barrel Chest - no.  Wheeze - no, Crackles - no, No overt respiratory distress CVS: Normal heart sounds. Murmurs - no Ext: Stigmata of Connective Tissue Disease - no HEENT: Normal upper airway. PEERL +. No post nasal drip        Assessment/     Assessment & Plan COPD with acute exacerbation (HCC)  Stage 3 severe COPD by GOLD classification (HCC)    PLAN Patient Instructions     ICD-10-CM   1. Stage 3 severe COPD by GOLD classification (  HCC)  J44.9     2. Screening for lung cancer  Z12.2     3. Vertigo  R42     4. Chronic systolic dysfunction of left ventricle  I51.9       Stage 3 severe COPD by GOLD classification (HCC) COPD exacerbation 03/01/2024   - having copd flare up  Plan - do spot antigen test for covid and flu - check cbc with diff - to see if you qualify for biologic in case in the future - Please take prednisone  40 mg x1 day, then 30 mg x1 day, then 20 mg x1 day, then 10 mg x1 day, and then 5 mg x1 day and stop -Z pak  - continue trelegy scheduled - continue albuterol  as needed - continue night o2  Screening for lung cancer  - no cancer on CT chest July 2025 - very small nodules < 3mm only on CT uJuly 2025  Plan  - no futher CT chest unless clinically indicated  Vertigo- admit July 2025  Plan  - per PCP  and ENT  Chronic systolic dysfunction of left ventricle - new diagnosis July 2025 following [facility]  Plan  - per cardiology   FOLLOWUP 6 months with APP or Ramswamy 15 min    FOLLOWUP    Return in about 6 months (around 08/29/2024) for 15 min visit, with any of the APPS, with Dr Geronimo.    SIGNATURE    Dr. Dorethia Geronimo, M.D., F.C.C.P,  Pulmonary and Critical Care Medicine Staff Physician, [facility] Director - Interstitial Lung Disease  Program  Pulmonary Fibrosis [facility] at [facility] [place], [place], [address]  Pager: [phone], If no answer or between  15:00h - 7:00h: call [phone] Telephone: [phone]  2:03 PM 03/01/2024    [1]  Allergies Allergen Reactions   Acyclovir And Related Other (See Comments)    Unknown    Allegra [Fexofenadine] Hives   Amoxicillin Hives   Codeine Nausea And Vomiting   Elemental Sulfur Hives   Enalapril Maleate Cough   Hydrocodone  Bit-Homatrop Mbr Other (See Comments)    Unknown    Hydrocodone -Acetaminophen  Other (See Comments)    Unknown    Keflex [Cephalexin] Hives   Levofloxacin Other (See Comments)    insomnia   Lorabid [Loracarbef] Hives   Misc. Sulfonamide Containing Compounds Hives   Sulfa Antibiotics Hives   Sulfur Hives  [2]  Current Outpatient Medications:    albuterol  (VENTOLIN  HFA) 108 (90 Base) MCG/ACT inhaler, Inhale 1-2 puffs into the lungs every 6 (six) hours as needed for wheezing or shortness of breath., Disp: 18 g, Rfl: 6   aspirin  EC 81 MG tablet, Take 1 tablet (81 mg total) by mouth daily., Disp: , Rfl:    azithromycin  (ZITHROMAX ) 250 MG tablet, Five hundred milligrams azithromycin  on day 1 followed by 250 mg daily azithromycin  x 4 days for total 5 days, Disp: 5 tablet, Rfl: 0   Cyanocobalamin  (VITAMIN B 12 PO), Take by mouth daily., Disp: , Rfl:    DULoxetine  (CYMBALTA ) 30 MG capsule, Take 30 mg by mouth 2 (two) times daily., Disp: , Rfl:     furosemide (LASIX) 20 MG tablet, Take 20 mg by mouth as needed., Disp: , Rfl:    LAGEVRIO 200 MG CAPS capsule, Take 4 capsules by mouth 2 (two) times daily., Disp: , Rfl:    LORazepam  (ATIVAN ) 0.5 MG tablet, Take 0.25 mg by mouth See  admin instructions. Take 1/2 tablet by mouth 2-3 times a day as needed for anxiety., Disp: , Rfl:    losartan (COZAAR) 50 MG tablet, Take 50 mg by mouth daily., Disp: , Rfl:    meclizine  (ANTIVERT ) 25 MG tablet, 1.5-2 tablets Orally every 8 hours as needed for dizziness As needed, Disp: , Rfl:    Multiple Vitamin (MULTIVITAMIN) tablet, Take 1 tablet by mouth daily., Disp: , Rfl:    omeprazole (PRILOSEC) 20 MG capsule, Take 20 mg by mouth daily., Disp: , Rfl:    predniSONE  (DELTASONE ) 10 MG tablet, Please take prednisone  40 mg x1 day, then 30 mg x1 day, then 20 mg x1 day, then 10 mg x1 day, and then 5 mg x1 day and stop, Disp: 11 tablet, Rfl: 0   rosuvastatin  (CRESTOR ) 20 MG tablet, Take 1 tablet (20 mg total) by mouth daily., Disp: 90 tablet, Rfl: 3   SYNTHROID  88 MCG tablet, TAKE DAILY BEFORE BREAKFAST. 1 TABLET 6 DAYS EACH WEEK, BUT TAKE ONLY 1/2 TABLET ON THE 7TH DAY, Disp: 30 tablet, Rfl: 5   TRELEGY ELLIPTA  100-62.5-25 MCG/ACT AEPB, INHALE 1 PUFF BY MOUTH EVERY DAY, Disp: 60 each, Rfl: 11   VITAMIN D , CHOLECALCIFEROL, PO, Take 50,000 Units by mouth once a week. On Wednesdays, Disp: , Rfl:   "

## 2024-03-07 ENCOUNTER — Other Ambulatory Visit: Payer: Self-pay

## 2024-03-08 MED ORDER — LOSARTAN POTASSIUM 50 MG PO TABS: 50.0000 mg | ORAL_TABLET | Freq: Every day | ORAL | 2 refills | Status: AC

## 2024-03-22 ENCOUNTER — Ambulatory Visit: Admitting: Cardiology

## 2024-03-23 ENCOUNTER — Other Ambulatory Visit: Payer: Self-pay

## 2024-03-23 ENCOUNTER — Ambulatory Visit: Attending: Gastroenterology | Admitting: Physical Therapy

## 2024-03-23 ENCOUNTER — Encounter: Payer: Self-pay | Admitting: Physical Therapy

## 2024-03-23 DIAGNOSIS — R279 Unspecified lack of coordination: Secondary | ICD-10-CM | POA: Diagnosis present

## 2024-03-23 DIAGNOSIS — R293 Abnormal posture: Secondary | ICD-10-CM | POA: Diagnosis present

## 2024-03-23 DIAGNOSIS — M6281 Muscle weakness (generalized): Secondary | ICD-10-CM | POA: Insufficient documentation

## 2024-03-23 NOTE — Therapy (Signed)
 " OUTPATIENT PHYSICAL THERAPY FEMALE PELVIC EVALUATION   Patient Name: Shelby Lin MRN: [id number] DOB:22-Jun-1940, 84 y.o., female Today's Date: 03/23/2024  END OF SESSION:  PT End of Session - 03/23/24 1549     Visit Number 1    Number of Visits 10    Date for Recertification  06/21/24    Authorization Type [employer]    Authorization Time Period auth req - submitted auth request 03/23/24    PT Start Time 0245    PT Stop Time 0330    PT Time Calculation (min) 45 min    Activity Tolerance Patient tolerated treatment well    Behavior During Therapy WFL for tasks assessed/performed          Past Medical History:  Diagnosis Date   Anxiety    Arthritis    Asthma in child    Atherosclerotic heart disease of native coronary artery without angina pectoris    Cataracts, bilateral    Combined hyperlipidemia    Constipation, unspecified    COPD (chronic obstructive pulmonary disease) (HCC)    Coronary artery spasm    Diabetic neuropathy (HCC)    DM (diabetes mellitus) with complications (HCC)    Dupuytren's contracture    Dyspepsia    Familial tremor    Fatigue    Fibromyalgia syndrome    GERD (gastroesophageal reflux disease)    Hashimoto's thyroiditis    Hiatal hernia    History of colon polyps    Hx of Hashimoto thyroiditis    Hypercholesteremia    Hypertension    Hypothyroidism, acquired, autoimmune    IBS (irritable bowel syndrome)    Meniere's disease of left ear    Migraine without aura and without status migrainosus, not intractable    Multinodular goiter (nontoxic)    Nonexudative macular degeneration    Raynaud's syndrome without gangrene    Situational stress    Tachycardia    Thyroiditis, autoimmune    Ulcerative colitis, unspecified, without complications (HCC)    Vitamin B 12 deficiency    Vitamin D  deficiency    Wears glasses    Past Surgical History:  Procedure Laterality Date   ANAL RECTAL MANOMETRY Bilateral 11/26/2022    Procedure: ANO RECTAL MANOMETRY;  Surgeon: Burnette Fallow, MD;  Location: [facility];  Service: Gastroenterology;  Laterality: Bilateral;   BREAST EXCISIONAL BIOPSY Left    benign   BREAST EXCISIONAL BIOPSY Right    milk gland removed   BREAST LUMPECTOMY Left 1990   benign   BREAST LUMPECTOMY WITH NEEDLE LOCALIZATION Right 02/28/2013   Procedure: RIGHT BREAST NEEDLE LOCALIZATION LUMPECTOMY ;  Surgeon: Morene ONEIDA Olives, MD;  Location: [facility];  Service: General;  Laterality: Right;   CHOLECYSTECTOMY  2008   lapcholi   DILATION AND CURETTAGE OF UTERUS     LEFT HEART CATH AND CORONARY ANGIOGRAPHY N/A 09/07/2023   Procedure: LEFT HEART CATH AND CORONARY ANGIOGRAPHY;  Surgeon: Anner Alm ORN, MD;  Location: [facility] INVASIVE CV LAB;  Service: Cardiovascular;  Laterality: N/A;   LEFT HEART CATHETERIZATION WITH CORONARY ANGIOGRAM N/A 01/26/2013   Procedure: LEFT HEART CATHETERIZATION WITH CORONARY ANGIOGRAM;  Surgeon: Oneil Parchment, MD;  Location: [facility] CATH LAB;  Service: Cardiovascular;  Laterality: N/A;   PARTIAL HYSTERECTOMY  1971   TONSILLECTOMY     Patient Active Problem List   Diagnosis Date Noted   Anxiety disorder 09/05/2023   Type 2 diabetes mellitus with unspecified complications (HCC) 09/05/2023   Vitamin B12 deficiency (non  anemic) 09/05/2023   Ulcerative colitis (HCC) 09/05/2023   Chronic obstructive pulmonary disease (HCC) 09/05/2023   GERD (gastroesophageal reflux disease) 09/05/2023   Hypothyroidism 09/05/2023   Hypertension 09/05/2023   Dizziness 09/05/2023   Strain of left latissimus dorsi muscle 04/22/2023   Costal margin pain 04/17/2023   URI (upper respiratory infection) 02/26/2021   COVID-19 virus detected 03/25/2019   Physical deconditioning 02/23/2019   Gastroesophageal reflux disease 10/20/2018   Healthcare maintenance 10/20/2018   Bronchiectasis without complication (HCC) 04/22/2018   Nocturnal hypoxemia due to emphysema (HCC) 04/22/2018    Prediabetes 04/10/2018   Stage 3 severe COPD by GOLD classification (HCC) 03/01/2018   Overweight 10/13/2016   Ear fullness, left 09/08/2016   Goiter 11/02/2013   Hoarseness 11/02/2013   Action tremor 05/18/2013   Abnormal stress test 01/26/2013   Angina decubitus 01/26/2013   Chronic tension headaches 04/06/2012   Chronic night sweats 04/06/2012   Meniere's disease 04/06/2012   Chronic leg pain 04/06/2012   Hearing loss, sensorineural 08/25/2011   Hypothyroidism, acquired, autoimmune    Fibromyalgia syndrome    Thyroiditis, autoimmune    Multinodular goiter (nontoxic)    Tachycardia    Familial tremor    Coronary artery spasm    Dyspepsia    Fatigue    Combined hyperlipidemia    Dupuytren's contracture    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 She has taken stool softeners, metamucil, Lynzess, etc and nothing helps. She has repetitively done bowel clean outs and she is frustrated with this. She does not have any pain from her constipation, but she does have rectal spasms that happen randomly. Has a squatty potty at home that helps sometimes.  Fluid intake: maybe 1 40 oz water bottle per day; Splash flavored water (2 of these per day); 2 cups of coffee with breakfast; will have tea during the day sometimes   FUNCTIONAL LIMITATIONS: toileting (bowel movements)    PERTINENT HISTORY:  Medications for current condition: Metamucil/Miralax  Surgeries: partial hysterectomy, cholecystectomy  Other: IBS, colitis, Severe COPD stage 3  with hypoxemia in exercise; spinal osteopenia  Sexual abuse: No  PAIN:  Are you having pain? No - does not have any pain associated with bowel movements or constipation  NPRS scale: 0/10  PRECAUTIONS: None  RED FLAGS: None   WEIGHT BEARING RESTRICTIONS: No  FALLS:  Has patient fallen in last 6 months? Yes. Number of falls unsure how many times she has fallen recently Reports almost 2 years ago she fell through the deck at her daughter's house and has had a fear of falling ever since   OCCUPATION: retired   ACTIVITY LEVEL : low - walks when she can but doesn't do much exercise   PLOF: Independent  PATIENT GOALS: to go to the bathroom like a normal person   BOWEL MOVEMENT: Pain with bowel movement: No Type of bowel movement:Type (Bristol Stool Scale) 2-4, Frequency can go 8-9 days between bowel movements - last one was 1 week ago, Strain sometimes yes, and Splinting sometimes  Fully empty rectum: Yes: unless she has really had to strain Leakage: No                                                  Caused by: no Bowel urgency: no Pads: Yes: maybe once a day  Fiber supplement/laxative Yes   URINATION: Pain with urination: No Fully empty bladder: Yes:                                           Post-void dribble: No Stream: Strong Urgency: Yes  Frequency:during the day less than frequent                                                         Nocturia: Yes: maybe 2 times depending on water intake   Leakage: Urge to void and Coughing Pads/briefs: Yes: 2 per day usually  INTERCOURSE: not currently sexually active    PREGNANCY: Vaginal deliveries 2 Tearing Yes: first delivery   PROLAPSE: None  OBJECTIVE:  Note: Objective measures were completed at Evaluation unless otherwise noted.  PATIENT SURVEYS:   PFIQ-7: 5  COGNITION: Overall cognitive status: Within functional limits for tasks assessed     SENSATION: Light touch: Appears intact  LUMBAR SPECIAL TESTS:  Straight leg raise test: Positive  FUNCTIONAL TESTS:  Single leg stance:  Rt: +   Lt: +  Sit-up test: abdominal distortion present  Squat: weight shift laterally to the right  Bed mobility: within functional limits   GAIT: Assistive device utilized: Single point cane Comments: mild trendelenburg gait pattern with ambulation and unstable on feet  POSTURE: rounded shoulders and forward head  LUMBARAROM/PROM:  A/PROM A/PROM  Eval (% available)  Flexion 50  Extension 25  Right lateral flexion 50  Left lateral flexion 50  Right rotation 50  Left rotation 50   (Blank rows = not tested)  LOWER EXTREMITY ROM: within functional limits for all motions tested bilaterally   LOWER EXTREMITY MMT: 3/5 bilateral knees and hips grossly   PALPATION:  General: no tenderness present in abdominal quadrants or at diaphragm  Pelvic Alignment: anterior pelvic tilt in standing   Abdominal: increased costosternal rib angle  Diastasis: No Distortion: Yes   Breathing: apical  Scar tissue: Yes:   Active Straight Leg Raise: + bilateral                 External Perineal Exam: not performed today per pt request; wishes to be evaluated by Allean as she has worked with her before                              Internal Pelvic Floor: not performed today per pt request; wishes to be evaluated by Kristin as she has worked with her before   Patient confirms identification and approves PT to assess internal pelvic floor and treatment Yes No emotional/communication barriers or cognitive limitation. Patient is motivated to learn. Patient understands and agrees with treatment goals and plan. PT explains patient will be examined in standing, sitting, and lying down to see how their muscles and joints work. When they are ready, they will be  asked to remove their underwear so PT can examine their perineum. The patient is also given the option of providing their own chaperone as one is not provided in our facility. The patient also has the right and is explained the right to defer or refuse any part of the evaluation or treatment including the internal exam. With the patient's consent, PT will use one gloved finger to gently assess the muscles of the pelvic floor, seeing how well it contracts and relaxes and if there is muscle symmetry. After, the patient will get dressed and PT and patient will discuss exam findings and plan of care. PT and patient discuss plan of care, schedule, attendance policy and HEP activities. All internal or external pelvic floor assessments and/or treatments are completed with proper hand hygiene and gloves hands. If needed gloves are changed with hand hygiene during patient care time.  PELVIC MMT: not performed today per pt request; wishes to be evaluated by Kristin as she has worked with her before  MMT eval  Vaginal   Internal Anal Sphincter   External Anal Sphincter   Puborectalis   (Blank rows = not tested)  TONE: not performed today per pt request; wishes to be evaluated by Kristin as she has worked with her before   PROLAPSE: not performed today per pt request; wishes to be evaluated by Kristin as she has worked with her before   TODAY'S TREATMENT:                                                                                                                              DATE:   EVAL 03/23/24: Examination completed, findings reviewed, pt educated on POC, HEP, and self care. Pt motivated to participate in PT and agreeable to attempt recommendations.   Supine Diaphragmatic Breathing  - 1 x daily - 7 x weekly -  3 sets - 10 reps Patient Education Get To Know Your Pelvic Floor- Female High-Fiber Diet to Support Pelvic Health  PATIENT EDUCATION:  Education details: Get To Know Your Pelvic Floor;  High-Fiber Diet to Support Pelvic Health Person educated: Patient Education method: Explanation, Demonstration, Tactile cues, Verbal cues, and Handouts Education comprehension: verbalized understanding, returned demonstration, verbal cues required, tactile cues required, and needs further education  HOME EXERCISE PROGRAM: Access Code: [email or web address] URL: [email or web address] Date: 03/23/2024 Prepared by: Celena Domino  Exercises - Supine Diaphragmatic Breathing  - 1 x daily - 7 x weekly - 3 sets - 10 reps  Patient Education - Get To Know Your Pelvic Floor- Female - High-Fiber Diet to Support Pelvic Health  ASSESSMENT:  CLINICAL IMPRESSION: Patient is a 84 y.o. female  who was seen today for physical therapy evaluation and treatment for chronic constipation that has been going on for many years. She has worked closely with Dr. Burnette on bowel regulation, but does not prefer to do large bowel clean-outs. She will have a bowel movement every 7-9 days and her last BM was 1 week ago. She does not have pain in relation to her bowel movements or constipation, but occasionally she will have to manually splint in order to pass stool. She has low water intake, takes Metamucil/Miralax to manage constipation, and has a squatty potty at home. She also experiences urinary leakage with a high sense of urgency and with coughing. Objective findings include increased costosternal rib angle with no lateral rib cage expansion, apical breathing pattern, and moderate hip/core weakness. Pt has worked with another pelvic PT in the past and wishes to have her pelvic floor examination done with this individual at her first follow up visit, so no internal pelvic floor examination performed today. Pt provided with education on toileting mechanics, diaphragmatic breathing to promote blood flow to abdomen, and education on bowel massage to promote stool motility. Overall, pt tolerated session well and Pt would benefit  from additional PT to further address deficits.     OBJECTIVE IMPAIRMENTS: Abnormal gait, cardiopulmonary status limiting activity, decreased activity tolerance, decreased balance, decreased coordination, decreased endurance, decreased mobility, difficulty walking, decreased strength, impaired flexibility, and postural dysfunction.   ACTIVITY LIMITATIONS: continence and toileting  PARTICIPATION LIMITATIONS: community activity  PERSONAL FACTORS: Age, Past/current experiences, and Time since onset of injury/illness/exacerbation are also affecting patient's functional outcome.   REHAB POTENTIAL: Good  CLINICAL DECISION MAKING: Stable/uncomplicated  EVALUATION COMPLEXITY: Low   GOALS: Goals reviewed with patient? Yes  SHORT TERM GOALS: Target date: 04/20/2024  Pt will be independent with HEP.  Baseline: Goal status: INITIAL  2.  Pt will be independent with use of squatty potty, relaxed toileting mechanics, and improved bowel movement techniques in order to increase ease of bowel movements and complete evacuation. Baseline:  Goal status: INITIAL  3.  Pt will be independent with the knack, urge suppression technique, and double voiding in order to improve bladder habits and decrease urinary incontinence.  Baseline:  Goal status: INITIAL  LONG TERM GOALS: Target date: 09/20/2024  Pt will be independent with advanced HEP.  Baseline:  Goal status: INITIAL  2.  Pt to demonstrate improved coordination of pelvic floor and breathing mechanics with 10# squat with appropriate synergistic patterns to decrease pain and leakage at least 75% of the time for improved ability to complete a 30 minute workout with strain at pelvic floor and symptoms.   Baseline:  Goal status: INITIAL  3.  Pt will have greater than  1 bowel movements a week in order to feel more comfortable and decrease pressure on urinogenital system.  Baseline:  Goal status: INITIAL  4.  Pt will report her BMs are complete due  to improved bowel habits and evacuation techniques.  Baseline:  Goal status: INITIAL  5.  Patient will report independence with management of constipation including water intake, fiber intake, and downtraining techniques to ease toileting process and decrease instances of abdominal discomfort.  Baseline:  Goal status: INITIAL  PLAN:  PT FREQUENCY: 1-2x/week  PT DURATION: other: 3 months   PLANNED INTERVENTIONS: 97110-Therapeutic exercises, 97530- Therapeutic activity, 97112- Neuromuscular re-education, [phone]- Self Care, [id number]- Manual therapy, [phone]- Gait training, [phone]- Canalith repositioning, [phone]- Aquatic Therapy, Patient/Family education, Balance training, Taping, Joint mobilization, Spinal mobilization, Scar mobilization, DME instructions, Cryotherapy, Moist heat, and Biofeedback  PLAN FOR NEXT SESSION: rectal/vaginal pelvic floor examination; introduce urge drill and pelvic floor training; abdominal massage; toileting mechanics; manual to abdomen/back/hips; mobility and core strengthening   Celena JAYSON Domino, PT 03/23/2024, 3:50 PM  "

## 2024-03-23 NOTE — Patient Instructions (Signed)
 Toileting Mechanics 101: Urination  Positioning: sit all the way down on the toilet, feet flat on the floor, trunk relaxed Hovering over the toilet seat can impact your ability to relax the pelvic floor musculature and empty your bladder well  When you initiate urination, try to let the urine flow out naturally rather than pushing down. Pushing can limit the ability of your urethral sphincter to relax and open. Double voiding technique: When you think you are done urinating, try gently pushing to see if there is any remaining urine that can be expelled   You can try the following techniques to see if there is any remaining urine that can be expelled: Rocking back and forth Leaning side to side  Twisting your trunk  Taking deep belly breaths to promote blood flow to pelvic floor musculature  Defecation  Positioning: sit all the way down on the toilet, feet flat on the floor, trunk relaxed You can try using a squatty potty or toilet stool under your feet to change the angle of your rectum in your pelvic floor, making it easier to pass stool with less straining  Avoid straining or breath holding while on the toilet. This causes increased intra-abdominal pressure, which causes increased pressure down through the pelvic floor. We want to avoid this if possible. If you do feel the need to push to pass bowel movements, practice the following technique instead: Balloon breathing: fill up your belly with air like a balloon inflating.  Imagine you are fogging up a mirror with your breath as you exhale and gently push down and out through your pelvic floor. This relieves some pressure while you're still able to push the stool out.  You can try the following techniques to see if there is any remaining stool that can be expelled: Rocking back and forth Leaning side to side  Twisting your trunk  Taking deep belly breaths to promote blood flow to pelvic floor musculature   About Abdominal  Massage  Abdominal massage, also called external colon massage, is a self-treatment circular massage technique that can reduce and eliminate gas and ease constipation. The colon naturally contracts in waves in a clockwise direction starting from inside the right hip, moving up toward the ribs, across the belly, and down inside the left hip.  When you perform circular abdominal massage, you help stimulate your colons normal wave pattern of movement called peristalsis.  It is most beneficial when done after eating.  Positioning You can practice abdominal massage with oil while lying down, or in the shower with soap.  Some people find that it is just as effective to do the massage through clothing while sitting or standing.  How to Massage Start by placing your finger tips or knuckles on your right side, just inside your hip bone.  Make small circular movements while you move upward toward your rib cage.   Once you reach the bottom right side of your rib cage, take your circular movements across to the left side of the bottom of your rib cage.  Next, move downward until you reach the inside of your left hip bone.  This is the path your feces travel in your colon. Continue to perform your abdominal massage in this pattern for 10 minutes each day.     You can apply as much pressure as is comfortable in your massage.  Start gently and build pressure as you continue to practice.  Notice any areas of pain as you massage; areas of slight  pain may be relieved as you massage, but if you have areas of significant or intense pain, consult with your healthcare provider.  Other Considerations General physical activity including bending and stretching can have a beneficial massage-like effect on the colon.  Deep breathing can also stimulate the colon because breathing deeply activates the same nervous system that supplies the colon.   Abdominal massage should always be used in combination with a bowel-conscious  diet that is high in the proper type of fiber for you, fluids (primarily water), and a regular exercise program.

## 2024-05-03 ENCOUNTER — Ambulatory Visit

## 2024-05-10 ENCOUNTER — Ambulatory Visit

## 2024-05-17 ENCOUNTER — Ambulatory Visit

## 2024-05-24 ENCOUNTER — Ambulatory Visit

## 2024-05-31 ENCOUNTER — Ambulatory Visit

## 2024-06-20 ENCOUNTER — Ambulatory Visit: Admitting: Cardiology

## 2024-08-09 ENCOUNTER — Ambulatory Visit: Admitting: Internal Medicine
